# Patient Record
Sex: Female | Born: 1970 | Race: Black or African American | Hispanic: No | Marital: Married | Smoking: Never smoker
Health system: Southern US, Community
[De-identification: ages and names within clinical notes are randomized; demographics above are authoritative.]

## PROBLEM LIST (undated history)

## (undated) DIAGNOSIS — M75102 Unspecified rotator cuff tear or rupture of left shoulder, not specified as traumatic: Secondary | ICD-10-CM

## (undated) DIAGNOSIS — F418 Other specified anxiety disorders: Secondary | ICD-10-CM

## (undated) DIAGNOSIS — N3941 Urge incontinence: Secondary | ICD-10-CM

## (undated) DIAGNOSIS — G4700 Insomnia, unspecified: Secondary | ICD-10-CM

## (undated) DIAGNOSIS — R3915 Urgency of urination: Secondary | ICD-10-CM

## (undated) DIAGNOSIS — IMO0002 Reserved for concepts with insufficient information to code with codable children: Secondary | ICD-10-CM

## (undated) DIAGNOSIS — K5909 Other constipation: Secondary | ICD-10-CM

## (undated) DIAGNOSIS — M62830 Muscle spasm of back: Secondary | ICD-10-CM

## (undated) DIAGNOSIS — M961 Postlaminectomy syndrome, not elsewhere classified: Secondary | ICD-10-CM

## (undated) DIAGNOSIS — M5440 Lumbago with sciatica, unspecified side: Secondary | ICD-10-CM

## (undated) DIAGNOSIS — Z1231 Encounter for screening mammogram for malignant neoplasm of breast: Secondary | ICD-10-CM

## (undated) DIAGNOSIS — Z01818 Encounter for other preprocedural examination: Secondary | ICD-10-CM

## (undated) DIAGNOSIS — F419 Anxiety disorder, unspecified: Secondary | ICD-10-CM

## (undated) DIAGNOSIS — N939 Abnormal uterine and vaginal bleeding, unspecified: Secondary | ICD-10-CM

## (undated) DIAGNOSIS — Z6841 Body Mass Index (BMI) 40.0 and over, adult: Secondary | ICD-10-CM

## (undated) DIAGNOSIS — N950 Postmenopausal bleeding: Secondary | ICD-10-CM

## (undated) DIAGNOSIS — G8929 Other chronic pain: Secondary | ICD-10-CM

## (undated) DIAGNOSIS — S46012A Strain of muscle(s) and tendon(s) of the rotator cuff of left shoulder, initial encounter: Secondary | ICD-10-CM

## (undated) DIAGNOSIS — F5101 Primary insomnia: Secondary | ICD-10-CM

## (undated) DIAGNOSIS — K222 Esophageal obstruction: Secondary | ICD-10-CM

## (undated) DIAGNOSIS — I499 Cardiac arrhythmia, unspecified: Secondary | ICD-10-CM

## (undated) DIAGNOSIS — J45909 Unspecified asthma, uncomplicated: Secondary | ICD-10-CM

## (undated) DIAGNOSIS — Z789 Other specified health status: Secondary | ICD-10-CM

## (undated) DIAGNOSIS — K219 Gastro-esophageal reflux disease without esophagitis: Secondary | ICD-10-CM

## (undated) DIAGNOSIS — M1990 Unspecified osteoarthritis, unspecified site: Secondary | ICD-10-CM

## (undated) DIAGNOSIS — E669 Obesity, unspecified: Secondary | ICD-10-CM

## (undated) DIAGNOSIS — I209 Angina pectoris, unspecified: Secondary | ICD-10-CM

## (undated) HISTORY — PX: TONSILLECTOMY: SUR1361

## (undated) HISTORY — PX: COLONOSCOPY: SHX174

## (undated) HISTORY — PX: FRACTURE SURGERY: SHX138

## (undated) HISTORY — PX: WISDOM TOOTH EXTRACTION: SHX21

## (undated) MED ORDER — IBUPROFEN 600 MG TAB
600 mg | ORAL_TABLET | Freq: Three times a day (TID) | ORAL | Status: AC | PRN
Start: ? — End: 2011-12-27

## (undated) MED ORDER — HYDROCODONE-ACETAMINOPHEN 5 MG-325 MG TAB
5-325 mg | ORAL_TABLET | ORAL | Status: DC | PRN
Start: ? — End: 2018-07-25

---

## 1996-02-08 HISTORY — PX: ECTOPIC PREGNANCY SURGERY: SHX613

## 1996-02-08 HISTORY — PX: TONSILLECTOMY AND ADENOIDECTOMY: SUR1326

## 2002-02-07 HISTORY — PX: CARPAL TUNNEL RELEASE: SHX101

## 2009-10-23 LAB — METABOLIC PANEL, COMPREHENSIVE
A-G Ratio: 0.8 (ref 0.8–1.7)
ALT (SGPT): 32 U/L (ref 30–65)
AST (SGOT): 16 U/L (ref 15–37)
Albumin: 3.7 g/dL (ref 3.4–5.0)
Alk. phosphatase: 81 U/L (ref 50–136)
Anion gap: 5 mmol/L (ref 5–15)
BUN/Creatinine ratio: 7 — ABNORMAL LOW (ref 12–20)
BUN: 6 MG/DL — ABNORMAL LOW (ref 7–18)
Bilirubin, total: 0.6 MG/DL (ref 0.2–1.0)
CO2: 30 MMOL/L (ref 21–32)
Calcium: 9.1 MG/DL (ref 8.4–10.4)
Chloride: 100 MMOL/L (ref 100–108)
Creatinine: 0.9 MG/DL (ref 0.6–1.3)
GFR est AA: >60 mL/min/{1.73_m2} (ref >60)
GFR est non-AA: >60 mL/min/{1.73_m2} (ref >60)
Globulin: 4.5 g/dL — ABNORMAL HIGH (ref 2.0–4.0)
Glucose: 98 MG/DL (ref 74–99)
Potassium: 3.6 MMOL/L (ref 3.5–5.5)
Protein, total: 8.2 g/dL (ref 6.4–8.2)
Sodium: 135 MMOL/L — ABNORMAL LOW (ref 136–145)

## 2009-10-23 LAB — CBC WITH AUTOMATED DIFF
ABS. BASOPHILS: 0 10*3/uL (ref 0.0–0.06)
ABS. EOSINOPHILS: 0.2 10*3/uL (ref 0.0–0.4)
ABS. LYMPHOCYTES: 3.4 10*3/uL (ref 0.9–3.6)
ABS. MONOCYTES: 0.4 10*3/uL (ref 0.05–1.2)
ABS. NEUTROPHILS: 4.6 10*3/uL (ref 1.8–8.0)
BASOPHILS: 0 % (ref 0–2)
EOSINOPHILS: 2 % (ref 0–5)
HCT: 37.4 % (ref 35.0–45.0)
HGB: 12.4 g/dL (ref 12.0–16.0)
LYMPHOCYTES: 40 % (ref 21–52)
MCH: 30 PG (ref 24.0–34.0)
MCHC: 33.2 g/dL (ref 31.0–37.0)
MCV: 90.3 FL (ref 74.0–97.0)
MONOCYTES: 5 % (ref 3–10)
MPV: 9 FL — ABNORMAL LOW (ref 9.2–11.8)
NEUTROPHILS: 53 % (ref 40–73)
PLATELET: 345 10*3/uL (ref 135–420)
RBC: 4.14 M/uL — ABNORMAL LOW (ref 4.20–5.30)
RDW: 11.9 % (ref 11.6–14.5)
WBC: 8.6 10*3/uL (ref 4.6–13.2)

## 2009-10-23 LAB — URINALYSIS W/ RFLX MICROSCOPIC
Bilirubin: NEGATIVE
Blood: NEGATIVE
Glucose: NEGATIVE MG/DL
Ketone: NEGATIVE MG/DL
Leukocyte Esterase: NEGATIVE
Nitrites: NEGATIVE
Protein: NEGATIVE MG/DL
Specific gravity: >1.03 — ABNORMAL HIGH (ref 1.003–1.030)
Urobilinogen: 1 EU/DL (ref 0.2–1.0)
pH (UA): 6 (ref 5.0–8.0)

## 2009-10-23 LAB — HCG URINE, QL: HCG urine, QL: NEGATIVE

## 2009-10-23 LAB — LIPASE: Lipase: 81 U/L (ref 73–393)

## 2009-10-23 LAB — AMYLASE: Amylase: 46 U/L (ref 25–115)

## 2011-07-26 ENCOUNTER — Encounter

## 2011-09-21 NOTE — H&P (Signed)
[facility]                               [address] [place], [place] [address]                                   PRE-ADMISSION                             HISTORY AND PHYSICAL    PATIENT:    Tiffany [name], Tiffany [name]  MRN:           [id number]       DATE:   09/23/2011  BILLING:       [id number]    ROOM:  DICTATING:  [name], MD      HISTORY OF PRESENT ILLNESS: The patient is a 41 year old female who  presents to our office with severe right knee pain. This pain is worse with  activity and is better with rest. She has a painful catching sensation with  walking and has noted significant swelling.    REVIEW OF SYSTEMS  Negative for any headaches, blurred vision, cough, congestion, rhinitis,  sore throat, chest pain, palpitations, shortness of breath, abdominal pain,  bleeding, bruising, numbness or tingling.    A 12-point review of systems is otherwise negative.    PAST MEDICAL HISTORY: Unremarkable.    MEDICATIONS: She currently takes no medications.    ALLERGIES: SHE IS ALLERGIC TO DARVOCET.    PHYSICAL EXAMINATION  VITAL SIGNS: Temperature 97.8, pulse 68, respirations 16, blood pressure  132/88, weight 289, height 5 feet and 5 inches.  GENERAL APPEARANCE: Well-developed, well-nourished female in no acute  distress.  SKIN: No obvious lesions or lacerations noted.  EYES: EOMs intact. PERRLA.  EARS, NOSE AND MOUTH: Without obvious lesions.  NECK: Supple, full range of motion.  MUSCULOSKELETAL: Positive effusion, medial joint line tenderness to  palpation. Full range of motion with pain with hyperflexion.  HEART: Regular rate and rhythm.  LUNGS: Clear to auscultation bilaterally.  ABDOMEN: Soft, nontender. Bowel sounds present in all 4 quadrants.  NEUROLOGIC: Without obvious deficit.    DATA: An MRI is reviewed and is consistent with a right knee medial  meniscus tear.    IMPRESSION: Right knee medial meniscus tear.    PLAN: Given the patient's  persistent pain, she wishes to proceed with  surgery. All risks and benefits of surgery including bleeding, risk of  infection, and recurrence of pain were discussed and the patient wishes to  proceed with a right knee arthroscopic partial medial meniscectomy.  Arrangements will be made at this time. A history and physical was  performed.        Dictated By: Burnett Corrente, PA-C             [name], MD    ELP:wmx  D: 09/21/2011  11:43 A  T: 09/21/2011  12:12 P  Job: [id number]  CScriptDoc #: [id number]  cc:   [name], MD

## 2011-09-23 ENCOUNTER — Inpatient Hospital Stay: Payer: BLUE CROSS/BLUE SHIELD

## 2011-09-23 LAB — HCG URINE, QL. - POC: Pregnancy test,urine (POC): NEGATIVE

## 2011-09-23 MED ORDER — HYDROCODONE-ACETAMINOPHEN 7.5 MG-325 MG TAB
ORAL_TABLET | ORAL | Status: DC | PRN
Start: 2011-09-23 — End: 2011-12-20

## 2011-09-23 MED ADMIN — meperidine (DEMEROL) injection 12.5 mg: INTRAVENOUS | @ 16:00:00 | NDC 00409117630

## 2011-09-23 MED ADMIN — famotidine (PF) (PEPCID) injection 20 mg: INTRAVENOUS | @ 15:00:00 | NDC 00641602201

## 2011-09-23 MED ADMIN — HYDROmorphone (PF) (DILAUDID) 2 mg/mL injection: INTRAVENOUS | @ 16:00:00 | NDC 59011044225

## 2011-09-23 MED ADMIN — meperidine (DEMEROL) 25 mg/ml injection: INTRAVENOUS | @ 16:00:00 | NDC 00409117630

## 2011-09-23 MED ADMIN — lactated ringers infusion: INTRAVENOUS | @ 15:00:00 | NDC 00409795309

## 2011-09-23 MED ADMIN — ceFAZolin (ANCEF) 1 g in 0.9% sodium chloride (MBP/ADV) 50 mL MBP: INTRAVENOUS | @ 15:00:00 | NDC 00338055311

## 2011-09-23 MED ADMIN — bupivacaine-EPINEPHrine (PF) (SENSORCAINE PF) 0.5 %-1:200,000 injection: SUBCUTANEOUS | @ 16:00:00 | NDC 00409174929

## 2011-09-23 MED FILL — LACTATED RINGERS IV: INTRAVENOUS | Qty: 1000

## 2011-09-23 MED FILL — FAMOTIDINE (PF) 20 MG/2 ML IV: 20 mg/2 mL | INTRAVENOUS | Qty: 2

## 2011-09-23 MED FILL — MIDAZOLAM 1 MG/ML IJ SOLN: 1 mg/mL | INTRAMUSCULAR | Qty: 2

## 2011-09-23 MED FILL — DEMEROL (PF) 25 MG/ML INJECTION SYRINGE: 25 mg/mL | INTRAMUSCULAR | Qty: 1

## 2011-09-23 MED FILL — DILAUDID (PF) 2 MG/ML INJECTION SOLUTION: 2 mg/mL | INTRAMUSCULAR | Qty: 1

## 2011-09-23 MED FILL — BD POSIFLUSH NORMAL SALINE 0.9 % INJECTION SYRINGE: INTRAMUSCULAR | Qty: 10

## 2011-09-23 MED FILL — FENTANYL CITRATE (PF) 50 MCG/ML IJ SOLN: 50 mcg/mL | INTRAMUSCULAR | Qty: 5

## 2011-09-23 MED FILL — CEFAZOLIN 1 GRAM SOLUTION FOR INJECTION: 1 gram | INTRAMUSCULAR | Qty: 1000

## 2011-09-23 NOTE — Progress Notes (Signed)
Post-Anesthesia Evaluation & Assessment    Visit Vitals   Item Reading   ??? BP 121/62   ??? Pulse 78   ??? Temp 97.4 ??F (36.3 ??C)   ??? Resp 15   ??? Ht 5\' 5"  (1.651 m)   ??? Wt 131.997 kg (291 lb)   ??? BMI 48.43 kg/m2   ??? SpO2 99%       Nausea/Vomiting: no nausea    Post-operative hydration :adequate.    Pain score (VAS): 2    Mental status & Level of consciousness: alert and oriented x 3    Neurological status: moves all extremities, sensation grossly intact    Pulmonary status: airway patent, no supplemental oxygen required    Complications related to anesthesia: none    Patient has met all discharge requirements.    Additional comments:        JU-FANG Vernida Mcnicholas, MD

## 2011-09-23 NOTE — H&P (Signed)
Date of Surgery Update:  LACRECIA DELVAL was seen and examined.  History and physical has been reviewed. There have been no significant clinical changes since the completion of the originally dated History and Physical.  Patient identified by surgeon; surgical site was confirmed by patient and surgeon.    Signed By: Loma Newton, PA     September 23, 2011 10:04 AM

## 2011-09-23 NOTE — Brief Op Note (Signed)
[facility]     Operative Note      Patient: Tiffany [name]               Sex: female          DOA: 09/23/2011         Date of Birth:  Jan 14, 1971      Age:  41 y.o.        LOS:  LOS: 0 days       Preoperative Diagnosis:  836.0 right knee    Postoperative Diagnosis: 836.0  Right knee    Surgeon:  [name] MD     Assistant: Loma Newton, PA-C    Anesthesia:  general anesthesia    Procedure:  Procedure(s) with comments:  KNEE ARTHROSCOPY - RIGHT KNEE ARTHROSCOPIC PARTIAL MEDIAL MENISCECTOMY    Time out performed: YES    Estimated Blood Loss: 0 ML EBL           Implants:  * No implants in log *    Specimens: * No specimens in log *     Tubes or Drains:  NO           Complications:  None           Counts: Sponge and needle counts were correct times two.        Loma Newton PA-C  [facility]

## 2011-09-23 NOTE — Op Note (Signed)
[facility]                               [address]                          [place], [place] [address]                                 OPERATIVE REPORT    PATIENT:     [name], Tiffany D  MRN:            [id number]      DATE:   09/23/2011  BILLING:     [id number]  ROOM:        PACUPACUPL  ATTENDING:   Maudry Zeidan LUCIANO-PEREZ, MD  DICTATING:   Nou Chard LUCIANO-PEREZ, MD      PREOPERATIVE DIAGNOSIS: Medial meniscus tear, degenerative arthritis, right  knee.    POSTOPERATIVE DIAGNOSIS: Medial and lateral meniscus tear, degenerative  arthritis, grade 3 chondromalacia, medial femoral condyle, right knee.    PROCEDURES PERFORMED: Arthroscopy, partial medial and lateral meniscectomy,  chondroplasty medial femoral condyle, right knee.    ESTIMATED BLOOD LOSS:    SPECIMENS REMOVED:    SURGEON: Elzada Pytel Luciano-Perez, MD    ASSISTANT: Sarah M. Milosek, PA-C    BRIEF HISTORY: Patient has had significant problems with the right knee, no  response to conservative treatment. Consented for surgery after having  discussed at length possible risks and complications of surgery including  infection, bleeding, recurrence of pain, among other possible problems.    DESCRIPTION OF PROCEDURE: Patient was taken to the operating room, induced  under general endotracheal anesthesia by the anesthesia staff. The right  leg was prepped with DuraPrep solution and draped as a free sterile field.  An anterolateral portal was used as an arthroscopy portal, medial portal  was used as a work portal. Portals were made with 11 blade followed by  blunt trocars. Once the arthroscope was in place, the knee was  systematically evaluated. Suprapatellar pouch, patellofemoral joint with  significant grade 4 changes ________ femoral trochlea. Medial compartment,  a 3-cm area of grade 3 changes with unstable flaps, debrided to a stable  base using a 3.5 shaver. The radial tear of the posterior horn of the  medial  meniscus was debrided using straight basket forceps, 3.5 shaver  leaving a stable rim. Anterior cruciate ligament was intact. Lateral  compartment, no evidence of tears in the articular surface. Some cleavage  tear in the posterolateral attachment of the lateral meniscus that was  debrided to a stable base using a 3.5 shaver. Fluid was suctioned out. The  knee was injected with a mixture of Marcaine and Astramorph. Portals were  closed with 4-0 Monocryl. Sterile dressings were applied. Patient tolerated  procedure well, was taken to recovery without problems.                     Darvis Croft LUCIANO-PEREZ, MD    ELP:wmx  D: 09/23/2011 T: 09/23/2011 12:43 P  Job: [id number]  CScriptDoc #: [id number]  cc:   Aveen Stansel LUCIANO-PEREZ, MD

## 2011-09-23 NOTE — Op Note (Signed)
[facility]                               [address] [place], [place] [address]                                 OPERATIVE REPORT    PATIENT:     TOBA, CLAUDIO  MRN:            [id number]      DATE:   09/23/2011  BILLING:     [id number]  ROOM:        PACUPACUPL  ATTENDING:   [name], MD  DICTATING:   [name], MD      PREOPERATIVE DIAGNOSIS: Medial meniscus tear, degenerative arthritis, right  knee.    POSTOPERATIVE DIAGNOSIS: Medial and lateral meniscus tear, degenerative  arthritis, grade 3 chondromalacia, medial femoral condyle, right knee.    PROCEDURES PERFORMED: Arthroscopy, partial medial and lateral meniscectomy,  chondroplasty medial femoral condyle, right knee.    ESTIMATED BLOOD LOSS:    SPECIMENS REMOVED:    SURGEON: [name], MD    ASSISTANT: Burnett Corrente, PA-C    BRIEF HISTORY: Patient has had significant problems with the right knee, no  response to conservative treatment. Consented for surgery after having  discussed at length possible risks and complications of surgery including  infection, bleeding, recurrence of pain, among other possible problems.    DESCRIPTION OF PROCEDURE: Patient was taken to the operating room, induced  under general endotracheal anesthesia by the anesthesia staff. The right  leg was prepped with DuraPrep solution and draped as a free sterile field.  An anterolateral portal was used as an arthroscopy portal, medial portal  was used as a work portal. Portals were made with 11 blade followed by  blunt trocars. Once the arthroscope was in place, the knee was  systematically evaluated. Suprapatellar pouch, patellofemoral joint with  significant grade 4 changes ________ femoral trochlea. Medial compartment,  a 3-cm area of grade 3 changes with unstable flaps, debrided to a stable  base using a 3.5 shaver. The radial tear of the posterior horn of the  medial  meniscus was debrided using straight basket forceps, 3.5 shaver  leaving a stable rim. Anterior cruciate ligament was intact. Lateral  compartment, no evidence of tears in the articular surface. Some cleavage  tear in the posterolateral attachment of the lateral meniscus that was  debrided to a stable base using a 3.5 shaver. Fluid was suctioned out. The  knee was injected with a mixture of Marcaine and Astramorph. Portals were  closed with 4-0 Monocryl. Sterile dressings were applied. Patient tolerated  procedure well, was taken to recovery without problems.                     [name], MD    ELP:wmx  D: 09/23/2011 T: 09/23/2011 12:43 P  Job: [id number]  CScriptDoc #: [id number]  cc:   [name], MD

## 2011-09-26 MED FILL — LIDOCAINE (PF) 20 MG/ML (2 %) IJ SOLN: 20 mg/mL (2 %) | INTRAMUSCULAR | Qty: 5

## 2011-09-26 MED FILL — KETOROLAC TROMETHAMINE 30 MG/ML INJECTION: 30 mg/mL (1 mL) | INTRAMUSCULAR | Qty: 1

## 2011-09-26 MED FILL — LACTATED RINGERS IV: INTRAVENOUS | Qty: 1000

## 2011-09-26 MED FILL — ONDANSETRON (PF) 4 MG/2 ML INJECTION: 4 mg/2 mL | INTRAMUSCULAR | Qty: 2

## 2011-09-26 MED FILL — GLYCOPYRROLATE 0.2 MG/ML IJ SOLN: 0.2 mg/mL | INTRAMUSCULAR | Qty: 1

## 2011-09-26 MED FILL — DEXAMETHASONE SODIUM PHOSPHATE 4 MG/ML IJ SOLN: 4 mg/mL | INTRAMUSCULAR | Qty: 1

## 2011-10-05 NOTE — Progress Notes (Signed)
PT DAILY TREATMENT     Patient Name: Tiffany [name]  Date:10/05/2011  DOB: 02-08-1970  [x]   Patient DOB Verified  Payor: BLUE CR[place]  [place]  Product Type: PPO     In time:200  Out time:258  Total Treatment Time (min): 58  Visit #: 1 of 9    Treatment Area: Tear of medial cartilage or meniscus of knee, current [836.0]    SUBJECTIVE  Pain Level (0-10 scale): 7/10  Any medication changes, allergies to medications, adverse drug reactions, diagnosis change, or new procedure performed?: []  No    [x]  Yes (see summary sheet for update)  Subjective functional status/changes:   []  No changes reported  Pt c/o R knee pain s/p arthroscopic menisectomy on 09/23/11. Had stiches removed yesterday during f/u appointment with MD. Was informed that there were actually 2 tears in medial meniscus. Pt has been resting as much as possible since surgery. Had errands to run yesterday and had to go to MD, and was in "excrutiating pain" last night due to increased levels of activity. Prior to yesterday, knee pain was ~5/10. Hx of Baker's cyst on R knee. Pt fell getting out of bed; knee pain began after this (several months ago). Pt c/o difficulty with stairs, squatting, maneuvering LE in bed, walking. Pt was independent prior to knee injury. 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 Pt is active and is on her feet a lot.    OBJECTIVE    Physical Therapy Evaluation - Knee    Posture: []  Varus    []  Valgus    []  Recurvatum        []  Tibial Torsion    []  Foot Supination    []  Foot Pronation    Describe: no significant abnormalities    Gait:  []  Normal    [x]  Abnormal    [x]  Antalgic    []  NWB    Device: SPC    Describe: Pt ambulates with cane in R hand, decreased weight shift to R LE    ROM / Strength  []  Unable to assess                  AROM                      PROM                   Strength (1-5)    Left Right Left Right Left Right   Hip Flexion     4+/5 4/5    Extension     2+/5 2/5     Abduction     4+/5 4/5    Adduction         Knee Flexion 110 93   4+/5 3/5    Extension 0 4   5/5 3+/5   Ankle Plantarflexion     5/5 5/5    Dorsiflexion     5/5 5/5       Flexibility: []  Unable to assess at this time  Hamstrings:    (L) Tightness= []  WNL   [x]  Min   []  Mod   []  Severe    (R) Tightness= []  WNL   []  Min   [x]  Mod   []  Severe  Quadriceps:    (L) Tightness= []  WNL   [x]  Min   []  Mod   []  Severe    (R) Tightness=  unable to assess due to knee pain with flexion; able to bend R knee in prone to approximately 90 degrees, pt denied quad tightness but reported knee pain    Palpation: TTP surrounding scope sites, anterior patellar region, distal quadriceps region and infrapatellar region    Optional Tests:  Patellar Mobility   [] L [] R Hypermobile [x] L [x] R Hypomobile              Other tests/comments:  OBJECTIVE  Modality (rationale): decrease pain  [x]   Ice pack 5  min in sitting with R LE elevated    Therapeutic Exercise: [x]  see flow sheet     []  Other:_ (minutes) :15  Added/Changed Exercises: Created and instructed pt in HEP; handout provided and pt voiced understanding   []   Added:_  to improve (function):  []   Changed:_ to improve (function):    Gait Training: [x]  300 feet w/ SPC device on level surface with supervision level of assist  [x]   Other: Educated pt on using SPC in L UE to aid in normalizing gait pattern; pt displays good sequencing with cuing from PT; pt displays several LOB, easily corrected by pt with UE support on walls              (minutes) :5    Patient Education: [x]  Review HEP    []  Progressed/Changed HEP based on:_   []  positioning   []  body mechanics   []  transfers   []  heat/ice application   (minutes) :    Pain Level (0-10 scale) post treatment: 6-7/10    ASSESSMENT  [x]   See Plan of Care    PLAN  []   Upgrade activities as tolerated     []   Continue plan of care  []   Discharge due to:_  [x]  Other:_initiate POC      Cherly Beach Strozier, PT 10/05/2011  2:07 PM

## 2011-10-05 NOTE — Progress Notes (Signed)
[facility] ??? [facility]  [address]  [place], [place] [address]  [phone]   [phone] fax    Plan of Care/ Statement of Necessity for Physical Therapy Services    Patient name: Tiffany [name]      Start of Care: 10/05/2011   Referral source: [name],*      DOB: 1970-09-26   Diagnosis: Tear of medial cartilage or meniscus of knee, current [836.0]   Onset Date:09/23/11    Prior Hospitalization:see medical history    Provider#: [id number]  Comorbidities: arthritis  Prior Level of Function:independent with ADL's, no AD for ambulation, full-time employment        The Plan of Care and following information is based on the information from the initial evaluation.  Assessment/ key information: Pt is a 41 year old female who presents to IE with chief c/o R knee pain s/p arthroscopic medial menisectomy on 09/23/11. Pt presents with decreased ROM, decreased strength, impaired gait, decreased patellar mobility, decreased hamstring flexibility, impaired transfer ability due to pain. Pt would benefit from skilled PT to address the impairments listed above and below.     Problem List: pain affecting function, decrease ROM, decrease strength, impaired gait/ balance, decrease ADL/ functional abilitiies, decrease activity tolerance, decrease flexibility/ joint mobility and decrease transfer abilities     Treatment Plan may include any combination of the following: Therapeutic exercise, Therapeutic activities, Neuromuscular re-education, Physical agent/modality, Gait/balance training, Manual therapy and Patient education    Patient / Family readiness to learn indicated by: asking questions, trying to perform skills and interest    Persons(s) to be included in education: patient (P)    Barriers to Learning/Limitations: no    Patient Goal (s): ???at least being able to exercise???    Rehabilitation Potential: good    Short Term Goals: To be accomplished in 1-2 weeks:  1.  Patient will demonstrate  independence and report compliance with HEP to allow patient to take an active role in the rehabilitation process.   2.  Patient will increase R knee AROM to at least 100 degrees of flexion to ease pt's ability to normalize gait pattern and ease pt's ability to perform sit to stand transfers.  Long Term Goals: To be accomplished in 3 weeks:  1.  Patient will improve LEFS score by at least 16 points to demonstrate significant functional improvements.   2.  Patient will increase R hip and knee strength by at least 1/3 MMT grade to assist pt with transfers and stair negotiation.   3.  Pt will improve R knee AROM to at least 0-110 degrees to ease sit to stand transfers and stair negotiation and aid in normalizing gait.  4.  Patient will ambulate at least 600' safely with normalized gait pattern and LRAD to allow pt to return to normal community ambulation.    Frequency / Duration: Patient to be seen 3 times per week for 3 weeks.    Patient/ Caregiver education and instruction: exercises and other ice and elevation of knee to address pain, use of AD in L UE and sequencing with AD    Santa Genera, PT 10/05/2011 5:11 PM    ________________________________________________________________________    I certify that the above Therapy Services are being furnished while the patient is under my care. I agree with the treatment plan and certify that this therapy is necessary.    Physician's Signature:____________________  Date:____________Time: _________    Please sign and return to [facility] ??? [facility]  [address]  [place], [place] [id number]  [phone]   [phone]) [phone] fax

## 2011-10-11 NOTE — Progress Notes (Signed)
PT DAILY TREATMENT NOTE     Patient Name: Tiffany [name]  Date:10/11/2011  DOB: May 05, 1970  [x]   Patient DOB Verified  Payor: BLUE CROSS  [place]: [place]  Product Type: PPO     In time:430  Out time:504  Total Treatment Time (min): 34  Visit #: 2 of 9    Treatment Area: Tear of medial cartilage or meniscus of knee, current [836.0]    SUBJECTIVE  Pain Level (0-10 scale): 5/10  Any medication changes, allergies to medications, adverse drug reactions, diagnosis change, or new procedure performed?: [x]  No    []  Yes (see summary sheet for update)  Subjective functional status/changes:   []  No changes reported  Pt reports that she has tried to quit using SPC all together. Reports she does not want to be dependent on cane and wants to start walking for fitness at her local track. Reports that it occasionally feels like R LE wants to give out on her. Has tried HEP at home.    OBJECTIVE  Therapeutic Exercise: [x]  see flow sheet     []  Other:_ (minutes) :29  Added/Changed Exercises:  []   Added:_  to improve (function):  []   Changed:_ to improve (function):    Gait Training: [x]  600 feet w/ no device on level surface with supervision level of assist  []   Other:_       (minutes) :5    Patient Education: [x]  Review HEP    []  Progressed/Changed HEP based on:_   []  positioning   []  body mechanics   []  transfers   []  heat/ice application   (minutes) :    Other Objective/Functional Measures: Initiated POC per flow sheet. Pt ambulated 600 feet with no AD and supervision. Consistently grasps wall with R UE while ambulating. Reports that she feels her feelings of instability are due to strength deficits, not balance deficits. Pt displays decreased weight shift to R LE with compensated Trendelenburg and decreased cadence. Recommended that pt continue to use SPC while out in the community to prevent falls (i.e. Due to slipping on wet floor, being bumped by other people, walking long distances, etc). Pt deferred offer for  cold pack this session; recommended that pt ice at home if she gets sore.  Pain Level (0-10 scale) post treatment: 5/10    ASSESSMENT  []   See Plan of Care  []   See progress note/recertification  [x]   Patient will continue to benefit from skilled therapy to address remaining functional deficits: pain, gait, strength, ROM/flexibility, transfers, return to PLOF    Progress towards goals / Updated goals:  STG # 1 : progressing per pt reports      PLAN  []   Upgrade activities as tolerated     [x]   Continue plan of care  []   Discharge due to:_  []  Other:_      Cherly Beach Strozier, PT 10/11/2011  4:43 PM

## 2011-10-12 NOTE — Progress Notes (Signed)
PT DAILY TREATMENT NOTE     Patient Name: Tiffany [name]  Date:10/12/2011  DOB: 1970/03/10  [x]   Patient DOB Verified  Payor: BLUE CROSS  [place]  Product Type: PPO     In time:2:00  Out time:2:35  Total Treatment Time (min): 35  Visit #: 3 of 9    Treatment Area: Tear of medial cartilage or meniscus of knee, current [836.0]    SUBJECTIVE  Pain Level (0-10 scale): 4/10  Any medication changes, allergies to medications, adverse drug reactions, diagnosis change, or new procedure performed?: [x]  No    []  Yes (see summary sheet for update)  Subjective functional status/changes:   []  No changes reported  Patient reports that she feels like her knee locks sometimes.     OBJECTIVE    Therapeutic Exercise: [x]  see flow sheet     []  Other:_ (minutes) :30  Added/Changed Exercises:  []   Added:_  to improve (function):  []   Changed:_ to improve (function):    Therapeutic Activity: balance and dynamic gait  (minutes) :5    Patient Education: [x]  Review HEP    []  Progressed/Changed HEP based on:_   []  positioning   []  body mechanics   []  transfers   []  heat/ice application   (minutes) :    Other Objective/Functional Measures: No complaints of pain with TherEx only fatigue. (R) knee flexion AROM is 107 degrees showing progress.   Pain Level (0-10 scale) post treatment: 4/10    ASSESSMENT  []   See Plan of Care  []   See progress note/recertification  [x]   Patient will continue to benefit from skilled therapy to address remaining functional deficits: pain control, flexibility, ROM, strength, balance and gait to increase ease with ADLs.     Progress towards goals / Updated goals:  STG # 2: met with knee flexion AROM    PLAN  [x]   Upgrade activities as tolerated     [x]   Continue plan of care  []   Discharge due to:_  []  Other:_      French Ana, PTA 10/12/2011  2:03 PM

## 2011-10-14 NOTE — Progress Notes (Signed)
PT DAILY TREATMENT NOTE     Patient Name: Tiffany [name]  Date:10/14/2011  DOB: May 15, 1970  [x]   Patient DOB Verified  Payor: BLUE CR[place]  [place]  Product Type: PPO     In time:1:02  Out time:1:53  Total Treatment Time (min): 51  Visit #: 4 of 9    Treatment Area: Tear of medial cartilage or meniscus of knee, current [836.0]    SUBJECTIVE  Pain Level (0-10 scale): 5/10  Any medication changes, allergies to medications, adverse drug reactions, diagnosis change, or new procedure performed?: [x]  No    []  Yes (see summary sheet for update)  Subjective functional status/changes:   []  No changes reported  Patient reports that she has been having trouble sleeping due to pain and aching at night. States that her knee was aching very bad last night. Pain med and ice did not relieve pain.    OBJECTIVE  Modality rationale:  decrease pain and swelling   10 min [x]   Other: Vaso to (R) knee     Therapeutic Exercise: [x]  see flow sheet     []  Other:_ (minutes) :36  Added/Changed Exercises:  []   Added:_  to improve (function):  []   Changed:_ to improve (function):    Therapeutic Activity: balance and dynamic gait  (minutes) :5    Patient Education: [x]  Review HEP    []  Progressed/Changed HEP based on:_   []  positioning   []  body mechanics   []  transfers   []  heat/ice application   (minutes) :    Other Objective/Functional Measures: Patient had no c/o increased pain with TherEx. Knee flexion AROM is 113 degrees.   Pain Level (0-10 scale) post treatment: 4/10    ASSESSMENT  []   See Plan of Care  []   See progress note/recertification  [x]   Patient will continue to benefit from skilled therapy to address remaining functional deficits: pain and swelling control, flexibility, ROM, strength, balance and gait to increase ease with ADLs.     Progress towards goals / Updated goals:  LTG# 3 : met with ROM    PLAN  [x]   Upgrade activities as tolerated     [x]   Continue plan of care  []   Discharge due to:_  []  Other:_       French Ana, PTA 10/14/2011  1:18 PM

## 2011-10-17 NOTE — Progress Notes (Signed)
PT DAILY TREATMENT NOTE     Patient Name: Tiffany [name]  Date:10/17/2011  DOB: [birth year]  [x]   Patient DOB Verified  Payor: BLUE CROSS  [place]  Product Type: PPO     In time:1:05  Out time:1:48  Total Treatment Time (min): 43  Visit #: 5 of 9    Treatment Area: Tear of medial cartilage or meniscus of knee, current [836.0]    SUBJECTIVE  Pain Level (0-10 scale): 3/10  Any medication changes, allergies to medications, adverse drug reactions, diagnosis change, or new procedure performed?: [x]  No    []  Yes (see summary sheet for update)  Subjective functional status/changes:   []  No changes reported  Patient reports that her knee is feeling pretty good today.     OBJECTIVE    Therapeutic Exercise: [x]  see flow sheet     []  Other:_ (minutes) :33  Added/Changed Exercises:  []   Added:_  to improve (function):  []   Changed:_ to improve (function):    Therapeutic Activity: balance and dynamic gait  (minutes) :5    Manual Therapy: circulatory k-tape   (minutes) :5    Patient Education: [x]  Review HEP    []  Progressed/Changed HEP based on:_   []  positioning   []  body mechanics   []  transfers   []  heat/ice application   (minutes) :    Other Objective/Functional Measures: Patient required VCs for technique with mini-squats and reports discomfort with the exercise. MMT (R) quads: 4-/5 HS: 4-/5. Added circulatory kinesiotape today to decrease swelling.     Pain Level (0-10 scale) post treatment: 3/10    ASSESSMENT  []   See Plan of Care  []   See progress note/recertification  [x]   Patient will continue to benefit from skilled therapy to address remaining functional deficits: pain and swelling control, flexibility, ROM, strength and balance to increase ease with ADLs.     Progress towards goals / Updated goals:  LTG# 2 : progressing with strength     PLAN  [x]   Upgrade activities as tolerated     [x]   Continue plan of care  []   Discharge due to:_  []  Other:_      French Ana, PTA 10/17/2011  1:10 PM

## 2011-10-19 NOTE — Progress Notes (Signed)
PT DAILY TREATMENT NOTE     Patient Name: Tiffany [name]  Date:10/19/2011  DOB: 01-May-1970  [x]   Patient DOB Verified  Payor: BLUE CROSS  [place]  Product Type: PPO     In time:1:02  Out time:1:37  Total Treatment Time (min): 35  Visit #: 6 of 9    Treatment Area: Tear of medial cartilage or meniscus of knee, current [836.0]    SUBJECTIVE  Pain Level (0-10 scale): 3/10  Any medication changes, allergies to medications, adverse drug reactions, diagnosis change, or new procedure performed?: [x]  No    []  Yes (see summary sheet for update)  Subjective functional status/changes:   []  No changes reported  Patient reports that her knee felt stiff yesterday.     OBJECTIVE    Therapeutic Exercise: [x]  see flow sheet     []  Other:_ (minutes) :25  Added/Changed Exercises:  []   Added:_  to improve (function):  [x]   Changed:_increased per flow sheet to improve (function): strength     Therapeutic Activity: balance and dynamic gait  (minutes) :5    Neuromuscular re-ed: quads/VMO   (minutes): 5    Patient Education: [x]  Review HEP    []  Progressed/Changed HEP based on:_   []  positioning   []  body mechanics   []  transfers   []  heat/ice application   (minutes) :    Other Objective/Functional Measures: LEFS score is 28.8% showing an 18.8% improvement in function since [facility]. Patient c/o slight increased pain with increase in TherEx today. PD ice in clinic. Encouraged patient to apply ice at home. Circulatory k-tape still intact. Plan to re-apply next visit.   Pain Level (0-10 scale) post treatment: 4/10    ASSESSMENT  []   See Plan of Care  []   See progress note/recertification  [x]   Patient will continue to benefit from skilled therapy to address remaining functional deficits: pain and swelling control, flexibility and strength to increase ease with ADLs and (A) return to PLOF.     Progress towards goals / Updated goals:  LTG# 1 : progressing well as LEFS score shows a 15 point increase since IE.     PLAN  [x]    Upgrade activities as tolerated     [x]   Continue plan of care  []   Discharge due to:_  []  Other:_      French Ana, PTA 10/19/2011  1:05 PM

## 2011-10-21 NOTE — Progress Notes (Signed)
PT DAILY TREATMENT NOTE     Patient Name: Tiffany [name]  Date:10/21/2011  DOB: 12/01/1970  [x]   Patient DOB Verified  Payor: BLUE CROSS  [place]  Product Type: PPO     In time:1:00  Out time:1:44  Total Treatment Time (min): 44  Visit #: 7 of 9    Treatment Area: Tear of medial cartilage or meniscus of knee, current [836.0]    SUBJECTIVE  Pain Level (0-10 scale): 5/10  Any medication changes, allergies to medications, adverse drug reactions, diagnosis change, or new procedure performed?: [x]  No    []  Yes (see summary sheet for update)  Subjective functional status/changes:   []  No changes reported  Patient reports that her knee is starting to "lock" again like it did before surgery. States that she was in a lot of pain yesterday.     OBJECTIVE    Therapeutic Exercise: [x]  see flow sheet     []  Other:_ (minutes) :34  Added/Changed Exercises:  []   Added:_  to improve (function):  []   Changed:_ to improve (function):    Therapeutic Activity: balance and dynamic gait  (minutes) :5    Manual Therapy: patellar mobs    (minutes) :5    Patient Education: [x]  Review HEP    []  Progressed/Changed HEP based on:_   []  positioning   []  body mechanics   []  transfers   []  heat/ice application   (minutes) :    Other Objective/Functional Measures: Key functional changes: Patient is progressing in PT with improved ROM and strength. (R) knee AROM is 0-113 degrees. (R) knee strength is 4-/5. MMT (R) hip strength is 4/5. Patient ambulates community distances without A.D however ambulates with antalgic gait pattern demonstrating decreased weight shift and stance time on (R) LE. LEFS score is 28.8% showing an 18.8% improvement in function since IE. Patient has recently began reporting a "locking" sensation in her knee that she says is similar to how it felt prior to surgery. Average pain level is 4-5/10.   Pain Level (0-10 scale) post treatment: 5/10    ASSESSMENT  []   See Plan of Care  [x]   See progress  note/recertification  [x]   Patient will continue to benefit from skilled therapy to address remaining functional deficits: pain and swelling control, flexibility, strength and gait to increase ease with ADLs and (A) return to PLOF. Continue PT 2-3 times per week for 3 weeks.     Progress towards goals / Updated goals:  STG # 1 : met as pt reports compliance with HEP  STG # 2: met with increased knee ROM  LTG# 1 : progressing well as LEFS shows 18.8% improvement since IE  LTG# 2 : progressing well with strength   LTG# 3 : met with knee ROM  LTG# 4 : progressing with gait    Updated goals:  1. Patient will improve LEFS score by 10% to increase ease with ADLs.  2. Patient will ambulate community distances with normalized gait pattern to (A) return to PLOF.  3. Patient will increase (R) LE strength by 1/3 MMT grade to increase ease with ADLs.      PLAN  [x]   Upgrade activities as tolerated     [x]   Continue plan of care  []   Discharge due to:_  []  Other:_      French Ana, PTA 10/21/2011  1:01 PM

## 2011-10-21 NOTE — Progress Notes (Signed)
[facility] ??? [facility]  [address]  [place], [place] [address]  [phone]   [phone] fax    Physician Update  [x]  Progress Note  []  Discharge Summary  Tiffany [name]  1970/07/16  [name],*  Diagnosis: R knee medial menisectomy 09/23/11  Date of Initial Visit: 10/05/11  Attended Visits: 7  Missed Visits: 1    Progress towards Goals: Pt is a 41 year old female who presented to IE with chief c/o R knee pain s/p medial menisectomy. Pt is progressing well in PT and has met goals as described below. Current objective measurements are as follows: Patient is progressing in PT with improved ROM and strength. (R) knee AROM is 0-113 degrees. (R) knee strength is 4-/5. MMT (R) hip strength is 4/5. Patient ambulates community distances without A.D however ambulates with antalgic gait pattern demonstrating decreased weight shift and stance time on (R) LE. LEFS score is 28.8% showing an 18.8% improvement in function since IE. Patient has recently began reporting a "locking" sensation in her knee that she says is similar to how it felt prior to surgery. Average pain level is 4-5/10.     Progress towards goals / Updated goals:   STG # 1 : met as pt reports compliance with HEP   STG # 2: met with increased knee ROM   LTG# 1 : progressing well as LEFS shows 18.8% improvement since IE   LTG# 2 : progressing well with strength   LTG# 3 : met with knee ROM   LTG# 4 : progressing with gait    Goals: to be achieved in 3-4 weeks:  Updated goals:   1. Patient will improve LEFS score by 10% to increase ease with ADLs.   2. Patient will ambulate community distances with normalized gait pattern to (A) return to PLOF.   3. Patient will increase (R) LE strength by 1/3 MMT grade to increase ease with ADLs.     ASSESSMENT/RECOMMENDATIONS:  [x] Continue therapy per initial plan/protocol at a frequency of  2-3 x per week for 3-4 weeks  [] Continue therapy with the following recommended  changes:_____________________      _____________________________________________________________________  [] Discontinue therapy progressing towards or have reached established goals  [] Discontinue therapy due to lack of appreciable progress towards goals  [] Discontinue therapy due to lack of attendance or compliance  [] Await Physician's recommendations/decisions regarding therapy  [] Other:________________________________________________________________    Thank you for this referral.    Cherly Beach Strozier, PT 10/21/2011 2:51 PM    NOTE TO PHYSICIAN:  PLEASE COMPLETE THE ORDERS BELOW AND   FAX TO [facility]: [phone]  If you are unable to process this request in 24 hours please contact our office: [phone]    []   I have read the above report and request that my patient continue as recommended.  []   I have read the above report and request that my patient continue therapy with the following changes/special instructions:________________________________________  [] I have read the above report and request that my patient be discharged from therapy.    Physician???s signature: _____________________________Date: ______Time:_______

## 2011-10-24 NOTE — Progress Notes (Signed)
PT DAILY TREATMENT NOTE     Patient Name: Tiffany [name]  Date:10/24/2011  DOB: [birth year]  [x]   Patient DOB Verified  Payor: BLUE CROSS  [place]  Product Type: PPO     In time:1:00  Out time:1:35  Total Treatment Time (min): 35  Visit #: 8 of 9    Treatment Area: Tear of medial cartilage or meniscus of knee, current [836.0]    SUBJECTIVE  Pain Level (0-10 scale): 4/10  Any medication changes, allergies to medications, adverse drug reactions, diagnosis change, or new procedure performed?: [x]  No    []  Yes (see summary sheet for update)  Subjective functional status/changes:   []  No changes reported  Patient reports that her knee is swollen from doing a lot of driving yesterday.     OBJECTIVE    Therapeutic Exercise: [x]  see flow sheet     []  Other:_ (minutes) :25  Added/Changed Exercises:  []   Added:_  to improve (function):  []   Changed:_ to improve (function):    Therapeutic Activity: balance and dynamic gait  (minutes) :5    Manual Therapy: patellar mobs    (minutes) :5    Patient Education: [x]  Review HEP    []  Progressed/Changed HEP based on:_   []  positioning   []  body mechanics   []  transfers   []  heat/ice application   (minutes) :    Other Objective/Functional Measures: Patient reports increased pain with TherEx. Patient unable to complete step ups due to pain. Held mini-squats due to increased pain. Patient presents with lateral tracking and tilt of patella.    Pain Level (0-10 scale) post treatment: 3/10    ASSESSMENT  []   See Plan of Care  []   See progress note/recertification  [x]   Patient will continue to benefit from skilled therapy to address remaining functional deficits: pain and swelling control, flexibility, strength, balance and gait to increase functional mobility, ease with ADLs and (A) return to PLOF.     Progress towards goals / Updated goals:  Assessed last visit and PN written.     PLAN  [x]   Upgrade activities as tolerated     [x]   Continue plan of care  []    Discharge due to:_  []  Other:_      French Ana, PTA 10/24/2011  1:00 PM

## 2011-10-26 NOTE — Progress Notes (Signed)
PT DAILY TREATMENT NOTE     Patient Name: Tiffany [name]  Date:10/26/2011  DOB: 1970/03/14  [x]   Patient DOB Verified  Payor: BLUE CROSS  [place]  Product Type: PPO     In time:1:00  Out time:1:50  Total Treatment Time (min): 50  Visit #: 1 of 9-12    Treatment Area: Tear of medial cartilage or meniscus of knee, current [836.0]    SUBJECTIVE  Pain Level (0-10 scale): 4-5/10  Any medication changes, allergies to medications, adverse drug reactions, diagnosis change, or new procedure performed?: [x]  No    []  Yes (see summary sheet for update)  Subjective functional status/changes:   []  No changes reported  Patient reports that her knee is still popping and swelling.     OBJECTIVE  Modality rationale:  decrease pain and swelling   10 min [x]  Estim, type: IFC to knee                                         []   att     [x]   unatt     []   w/US     [x]   w/ice    []   w/heat     Therapeutic Exercise: [x]  see flow sheet     []  Other:_ (minutes) :35  Added/Changed Exercises:  []   Added:_  to improve (function):  []   Changed:_ to improve (function):    Manual Therapy: per flow sheet    (minutes) :5    Patient Education: [x]  Review HEP    []  Progressed/Changed HEP based on:_   []  positioning   []  body mechanics   []  transfers   []  heat/ice application   (minutes) :    Other Objective/Functional Measures: Held TherEx per flow sheet due to pain and initiated E-stim/CP trial to decrease pain.    Pain Level (0-10 scale) post treatment: 2/10    ASSESSMENT  []   See Plan of Care  []   See progress note/recertification  [x]   Patient will continue to benefit from skilled therapy to address remaining functional deficits: pain and swelling control, flexibility, ROM, strength and gait to increase ease with ADLs.     Progress towards Updated goals:  # 2 : continues to ambulate with antalgic gait pattern    PLAN  [x]   Upgrade activities as tolerated     [x]   Continue plan of care  []   Discharge due to:_  []  Other:_       French Ana, PTA 10/26/2011  1:08 PM

## 2011-12-20 NOTE — ED Provider Notes (Signed)
I was personally available for consultation in the emergency department.  I have reviewed the chart prior to the patient being discharged and agree with the documentation recorded by the MLP, including the assessment, treatment plan, and disposition.  Geran Haithcock Isaac Jessee Newnam, MD
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
## 2011-12-20 NOTE — ED Notes (Signed)
I have reviewed discharge instructions with the patient.  The patient verbalized understanding.  Patient armband removed and shredded, scripts x 2 given

## 2012-02-08 HISTORY — PX: CHOLECYSTECTOMY: SHX55

## 2012-02-08 HISTORY — PX: DIAGNOSTIC LAPAROSCOPY: SUR761

## 2012-02-08 HISTORY — PX: REPAIR KNEE LIGAMENT: SUR1188

## 2012-02-14 NOTE — Progress Notes (Signed)
[facility] ??? [facility]  [address]  [place], [place] [address]  [phone]   [phone] fax    Discharge Summary    Tiffany [name]        Date: 02/14/2012  28-Jun-1970  [name],*    Diagnosis: R knee pain  Start of Care: 10/26/11  Visits from Start of Care: 9  Missed Visits: 2    Summary of Care: Pt was progressing in PT as discussed in most recent progress note (submitted 10/21/11). Pt cancelled her last scheduled appointment secondary to LE swelling and failed to return for continuation of therapy. Please see PN for most up to date pt status information, as no goals have been formally reassessed since last PN submission. Thank you for this referral.    ASSESSMENT/RECOMMENDATIONS:  [x] Discontinue therapy: [] Patient has reached or is progressing toward set goals      [x] Patient is non-compliant or has abdicated      [] Due to lack of appreciable progress towards set goals    Santa Genera, PT 02/14/2012 11:52 AM  NOTE TO PHYSICIAN:  PLEASE COMPLETE THE ORDERS BELOW AND   FAX TO [facility]: [phone]  If you are unable to process this request in 24 hours please contact our office: [phone]      I have read the above report and request that my patient be discharged from therapy    Other:______________________________________________________________    Physician???s signature: ________________________________Date: _____Time:_____

## 2014-02-07 DIAGNOSIS — I209 Angina pectoris, unspecified: Secondary | ICD-10-CM

## 2014-02-07 HISTORY — DX: Angina pectoris, unspecified: I20.9

## 2015-03-03 ENCOUNTER — Other Ambulatory Visit: Payer: Self-pay | Admitting: Neurosurgery

## 2015-03-18 ENCOUNTER — Encounter (HOSPITAL_COMMUNITY)
Admission: RE | Admit: 2015-03-18 | Discharge: 2015-03-18 | Disposition: A | Discharge status: Home / Self Care | Payer: PRIVATE HEALTH INSURANCE | Source: Ambulatory Visit | Attending: Neurosurgery | Admitting: Neurosurgery

## 2015-03-18 ENCOUNTER — Encounter (HOSPITAL_COMMUNITY): Payer: Self-pay

## 2015-03-18 DIAGNOSIS — Z01812 Encounter for preprocedural laboratory examination: Secondary | ICD-10-CM | POA: Diagnosis present

## 2015-03-18 DIAGNOSIS — M4310 Spondylolisthesis, site unspecified: Secondary | ICD-10-CM | POA: Insufficient documentation

## 2015-03-18 DIAGNOSIS — Z0183 Encounter for blood typing: Secondary | ICD-10-CM | POA: Diagnosis not present

## 2015-03-18 HISTORY — DX: Angina pectoris, unspecified: I20.9

## 2015-03-18 HISTORY — DX: Cardiac arrhythmia, unspecified: I49.9

## 2015-03-18 HISTORY — DX: Unspecified asthma, uncomplicated: J45.909

## 2015-03-18 HISTORY — DX: Unspecified osteoarthritis, unspecified site: M19.90

## 2015-03-18 HISTORY — DX: Gastro-esophageal reflux disease without esophagitis: K21.9

## 2015-03-18 HISTORY — DX: Obesity, unspecified: E66.9

## 2015-03-18 LAB — BASIC METABOLIC PANEL
Anion gap: 11 (ref 5–15)
BUN: 7 mg/dL (ref 6–20)
CHLORIDE: 102 mmol/L (ref 101–111)
CO2: 24 mmol/L (ref 22–32)
Calcium: 9.6 mg/dL (ref 8.9–10.3)
Creatinine, Ser: 0.75 mg/dL (ref 0.44–1.00)
GFR calc Af Amer: >60 mL/min (ref >60)
GFR calc non Af Amer: >60 mL/min (ref >60)
GLUCOSE: 87 mg/dL (ref 65–99)
POTASSIUM: 4 mmol/L (ref 3.5–5.1)
SODIUM: 137 mmol/L (ref 135–145)

## 2015-03-18 LAB — CBC
HEMATOCRIT: 38.3 % (ref 36.0–46.0)
Hemoglobin: 13.1 g/dL (ref 12.0–15.0)
MCH: 29.9 pg (ref 26.0–34.0)
MCHC: 34.2 g/dL (ref 30.0–36.0)
MCV: 87.4 fL (ref 78.0–100.0)
Platelets: 333 10*3/uL (ref 150–400)
RBC: 4.38 MIL/uL (ref 3.87–5.11)
RDW: 12.7 % (ref 11.5–15.5)
WBC: 7.1 10*3/uL (ref 4.0–10.5)

## 2015-03-18 LAB — HCG, SERUM, QUALITATIVE: Preg, Serum: NEGATIVE

## 2015-03-18 LAB — ABO/RH: ABO/RH(D): O POS

## 2015-03-18 LAB — SURGICAL PCR SCREEN
MRSA, PCR: NEGATIVE
Staphylococcus aureus: NEGATIVE

## 2015-03-18 NOTE — Progress Notes (Signed)
Patient's PCP was Frances Nickels NP in [place], but she has recently moved to [place] and patient has not yet contacted a new PCP.    Last year patient did have some chest pain and was referred to [facility] for this.  She states she had an EKG, possibly had an ECHO, and that the doctor cleared her but had wanted her to do a stress test that she wasn't able to do due to work hours.  Have requested last office visit, discharge summary, and EKG/ECHO.

## 2015-03-18 NOTE — Pre-Procedure Instructions (Addendum)
Kristina Estes  03/18/2015      Your procedure is scheduled on Friday February 17th 2017 at 730am .  Report to [facility] Admitting at 530 A.M.  Call this number if you have problems the morning of surgery:  [phone]   Remember:  Do not eat food or drink liquids after midnight Thursday February 16th.  Take these medicines the morning of surgery with A SIP OF WATER gabapentin (neurontin), flexeril (cyclobenzaprine) if needed, oxybutynin if needed  STOP: ALL Vitamins, Supplements, Effient and Herbal Medications, Fish Oils, Aspirins, NSAIDs (Nonsteroidal Anti-inflammatories such as Ibuprofen, Aleve, or Advil), and Goody's/BC Powders 7 days prior to surgery, until after surgery as directed by your physician. <Stop Alka-Estes, Meloxicam, Phentermine tomorrow.>   Do not wear jewelry, make-up or nail polish.  Do not wear lotions, powders, or perfumes.  You may wear deodorant.  Do not shave 48 hours prior to surgery.    Do not bring valuables to the hospital.  [facility] is not responsible for any belongings or valuables.  Contacts, dentures or bridgework may not be worn into surgery.  Leave your suitcase in the car.  After surgery it may be brought to your room.  For patients admitted to the hospital, discharge time will be determined by your treatment team.  Patients discharged the day of surgery will not be allowed to drive home.   Please read over the following fact sheets that you were given. Pain Booklet, Coughing and Deep Breathing, Blood Transfusion Information, MRSA Information and Surgical Site Infection Prevention        Preparing for Surgery at [facility]  Before surgery, you can play an important role.  Because skin is not sterile, your skin needs to be as free of germs as possible.  You can reduce the number of germs on your skin by washing with CHG (chlorahexidine gluconate) Soap before surgery.  CHG is an antiseptic cleaner with kills germs and bonds  with the skin to continue killing germs even after washing.   Please do not use if you have an allergy to CHG or antibacterial soaps.  If your skin becomes reddened/irritated stop using the CHG.  Do not shave (including legs and underarms) for at least 48 hours prior to first CHG shower.  It is okay to shave your face.  Please follow these instructions carefully:  1. Shower with CHG Soap the night before surgery and the morning of Surgery. 2. If you choose to wash your hair, wash your hair first as usual with your normal shampoo. 3. After you shampoo, rinse your hair and body thoroughly to remove the Shampoo. 4. Use CHG as you would any other liquid soap. You can apply chg directly to the skin and wash gently with scrungie or a clean washcloth. 5. Apply the CHG Soap to your body ONLY FROM THE NECK DOWN. Do not use on open wounds or open sores. Avoid contact with your eyes, ears, mouth and genitals (private parts). Wash genitals (private parts) with your normal soap. 6. Wash thoroughly, paying special attention to the area where your surgery will be performed. 7. Thoroughly rinse your body with warm water from the neck down. 8. DO NOT shower/wash with your normal soap after using and rinsing off the CHG Soap. 9. Pat yourself dry with a clean towel.  10. Wear clean pajamas.  11. Place clean sheets on your bed the night of your first shower and do not sleep with pets.  Day  of Surgery  Do not apply any lotions/deodorants the morning of surgery. Please wear clean clothes to the hospital/surgery center.

## 2015-03-23 NOTE — Progress Notes (Signed)
Records received from Cardiology consults.  No EKG received.  Will need one DOS.  I will refer chart to anesthesia to review cardiac notes.

## 2015-03-23 NOTE — Progress Notes (Addendum)
Anesthesia Chart Review: Patient is a 45 year old female scheduled for L5-S1 PLIF on 03/27/15 by Dr. Conchita Paris.   PAT 03/17/14. Chart was brought to me to review today (03/23/15).  History includes cholecystectomy '13, ectopic pregnancy, GERD, childhood asthma, arthritis, T&A, dysrhythmia (not specified).  BMI is consistent with morbid obesity. PCP has been Frances Nickels, NP in [place], [place]; however, she recently moved to [place] and patient has not yet contacted a new PCP. She was referred to cardiologist Dr. Daryel November in 06/2014 for chest pain, possible angina. He recommended a stress and echo. By notes, her EKG showed SR with first degree AVB. He also thought she would benefit from a sleep study at some point. Patient reports she never had the stress test due to work hours.   Meds include Flexeril, Neurontin, Ditropan XL, phentermine (told to hold).  06/27/14 Echo ([facility]): Impressions:  1. LA is dilated. 2. RV is dilated, contracts well. 3. EF 55-60%. 4. Mild TR. 5. TAPSE 20.7   Preoperative labs noted.   No EKG received from Dr. Rondel Baton office, re-requested.  Jessica at Dr. Val Riles office that since patient did not have her recommended stress test last year that she will need cardiac clearance (to determine if this is needed prior to her undergoing lumbar fusion).  Velna Ochs [facility]/Anesthesiology Phone [phone] 03/23/2015 12:09 PM  Addendum: Cardiologist Dr. Daryel November signed a note of cardiac clearance for planned surgery following a normal stress test.  03/24/15 Exercise Stress: Conclusion: No chest pain, normal stress, low risk for ischemia.   03/24/15 EKG: NSR.  Velna Ochs [facility]/Anesthesiology Phone [phone] 03/26/2015 2:28 PM

## 2015-03-26 MED ORDER — DEXTROSE 5 % IV SOLN
3.0000 g | INTRAVENOUS | Status: AC
  Administered 2015-03-27: 3 g via INTRAVENOUS
  Filled 2015-03-26: qty 3000

## 2015-03-27 ENCOUNTER — Inpatient Hospital Stay (HOSPITAL_COMMUNITY): Payer: PRIVATE HEALTH INSURANCE | Admitting: Anesthesiology

## 2015-03-27 ENCOUNTER — Inpatient Hospital Stay (HOSPITAL_COMMUNITY): Payer: PRIVATE HEALTH INSURANCE

## 2015-03-27 ENCOUNTER — Inpatient Hospital Stay (HOSPITAL_COMMUNITY): Payer: PRIVATE HEALTH INSURANCE | Admitting: Vascular Surgery

## 2015-03-27 ENCOUNTER — Inpatient Hospital Stay (HOSPITAL_COMMUNITY): Payer: PRIVATE HEALTH INSURANCE | Admitting: Certified Registered Nurse Anesthetist

## 2015-03-27 ENCOUNTER — Encounter (HOSPITAL_COMMUNITY)
Admission: RE | Disposition: A | Discharge status: Skilled Nursing Facility | Payer: Self-pay | Source: Ambulatory Visit | Attending: Neurosurgery

## 2015-03-27 ENCOUNTER — Inpatient Hospital Stay (HOSPITAL_COMMUNITY)
Admission: RE | Admit: 2015-03-27 | Discharge: 2015-04-28 | DRG: 459 | Disposition: A | Discharge status: Skilled Nursing Facility | Payer: PRIVATE HEALTH INSURANCE | Source: Ambulatory Visit | Attending: Neurosurgery | Admitting: Neurosurgery

## 2015-03-27 ENCOUNTER — Encounter (HOSPITAL_COMMUNITY): Payer: Self-pay | Admitting: *Deleted

## 2015-03-27 DIAGNOSIS — J9601 Acute respiratory failure with hypoxia: Secondary | ICD-10-CM | POA: Diagnosis not present

## 2015-03-27 DIAGNOSIS — M4800 Spinal stenosis, site unspecified: Secondary | ICD-10-CM | POA: Diagnosis present

## 2015-03-27 DIAGNOSIS — J189 Pneumonia, unspecified organism: Secondary | ICD-10-CM | POA: Diagnosis not present

## 2015-03-27 DIAGNOSIS — I82622 Acute embolism and thrombosis of deep veins of left upper extremity: Secondary | ICD-10-CM | POA: Diagnosis not present

## 2015-03-27 DIAGNOSIS — S35516A Injury of unspecified iliac vein, initial encounter: Secondary | ICD-10-CM | POA: Diagnosis not present

## 2015-03-27 DIAGNOSIS — I82412 Acute embolism and thrombosis of left femoral vein: Secondary | ICD-10-CM | POA: Diagnosis not present

## 2015-03-27 DIAGNOSIS — L8990 Pressure ulcer of unspecified site, unspecified stage: Secondary | ICD-10-CM | POA: Insufficient documentation

## 2015-03-27 DIAGNOSIS — D62 Acute posthemorrhagic anemia: Secondary | ICD-10-CM | POA: Diagnosis not present

## 2015-03-27 DIAGNOSIS — S35513A Injury of unspecified iliac artery, initial encounter: Secondary | ICD-10-CM | POA: Diagnosis not present

## 2015-03-27 DIAGNOSIS — Z0189 Encounter for other specified special examinations: Secondary | ICD-10-CM | POA: Insufficient documentation

## 2015-03-27 DIAGNOSIS — R739 Hyperglycemia, unspecified: Secondary | ICD-10-CM | POA: Diagnosis not present

## 2015-03-27 DIAGNOSIS — E440 Moderate protein-calorie malnutrition: Secondary | ICD-10-CM | POA: Diagnosis not present

## 2015-03-27 DIAGNOSIS — M7989 Other specified soft tissue disorders: Secondary | ICD-10-CM

## 2015-03-27 DIAGNOSIS — E46 Unspecified protein-calorie malnutrition: Secondary | ICD-10-CM | POA: Diagnosis not present

## 2015-03-27 DIAGNOSIS — S301XXA Contusion of abdominal wall, initial encounter: Secondary | ICD-10-CM | POA: Diagnosis present

## 2015-03-27 DIAGNOSIS — S36893A Laceration of other intra-abdominal organs, initial encounter: Secondary | ICD-10-CM

## 2015-03-27 DIAGNOSIS — M4317 Spondylolisthesis, lumbosacral region: Principal | ICD-10-CM | POA: Diagnosis present

## 2015-03-27 DIAGNOSIS — Z419 Encounter for procedure for purposes other than remedying health state, unspecified: Secondary | ICD-10-CM

## 2015-03-27 DIAGNOSIS — J9600 Acute respiratory failure, unspecified whether with hypoxia or hypercapnia: Secondary | ICD-10-CM | POA: Insufficient documentation

## 2015-03-27 DIAGNOSIS — M4326 Fusion of spine, lumbar region: Secondary | ICD-10-CM

## 2015-03-27 DIAGNOSIS — K5732 Diverticulitis of large intestine without perforation or abscess without bleeding: Secondary | ICD-10-CM | POA: Diagnosis present

## 2015-03-27 DIAGNOSIS — M1990 Unspecified osteoarthritis, unspecified site: Secondary | ICD-10-CM | POA: Diagnosis present

## 2015-03-27 DIAGNOSIS — R579 Shock, unspecified: Secondary | ICD-10-CM | POA: Diagnosis not present

## 2015-03-27 DIAGNOSIS — S31109A Unspecified open wound of abdominal wall, unspecified quadrant without penetration into peritoneal cavity, initial encounter: Secondary | ICD-10-CM

## 2015-03-27 DIAGNOSIS — R000 Tachycardia, unspecified: Secondary | ICD-10-CM | POA: Diagnosis not present

## 2015-03-27 DIAGNOSIS — K219 Gastro-esophageal reflux disease without esophagitis: Secondary | ICD-10-CM | POA: Diagnosis present

## 2015-03-27 DIAGNOSIS — Z791 Long term (current) use of non-steroidal anti-inflammatories (NSAID): Secondary | ICD-10-CM

## 2015-03-27 DIAGNOSIS — K5900 Constipation, unspecified: Secondary | ICD-10-CM | POA: Diagnosis not present

## 2015-03-27 DIAGNOSIS — K567 Ileus, unspecified: Secondary | ICD-10-CM | POA: Diagnosis not present

## 2015-03-27 DIAGNOSIS — J9602 Acute respiratory failure with hypercapnia: Secondary | ICD-10-CM | POA: Insufficient documentation

## 2015-03-27 DIAGNOSIS — Z01818 Encounter for other preprocedural examination: Secondary | ICD-10-CM

## 2015-03-27 DIAGNOSIS — R1900 Intra-abdominal and pelvic swelling, mass and lump, unspecified site: Secondary | ICD-10-CM

## 2015-03-27 DIAGNOSIS — M549 Dorsalgia, unspecified: Secondary | ICD-10-CM | POA: Diagnosis present

## 2015-03-27 DIAGNOSIS — J9690 Respiratory failure, unspecified, unspecified whether with hypoxia or hypercapnia: Secondary | ICD-10-CM

## 2015-03-27 DIAGNOSIS — Z6841 Body Mass Index (BMI) 40.0 and over, adult: Secondary | ICD-10-CM

## 2015-03-27 DIAGNOSIS — J45909 Unspecified asthma, uncomplicated: Secondary | ICD-10-CM | POA: Diagnosis present

## 2015-03-27 DIAGNOSIS — I82422 Acute embolism and thrombosis of left iliac vein: Secondary | ICD-10-CM | POA: Diagnosis not present

## 2015-03-27 DIAGNOSIS — K661 Hemoperitoneum: Secondary | ICD-10-CM | POA: Diagnosis not present

## 2015-03-27 DIAGNOSIS — Y95 Nosocomial condition: Secondary | ICD-10-CM | POA: Diagnosis not present

## 2015-03-27 DIAGNOSIS — E876 Hypokalemia: Secondary | ICD-10-CM | POA: Diagnosis not present

## 2015-03-27 DIAGNOSIS — R509 Fever, unspecified: Secondary | ICD-10-CM | POA: Diagnosis not present

## 2015-03-27 DIAGNOSIS — E8770 Fluid overload, unspecified: Secondary | ICD-10-CM | POA: Diagnosis not present

## 2015-03-27 DIAGNOSIS — M4306 Spondylolysis, lumbar region: Secondary | ICD-10-CM | POA: Diagnosis present

## 2015-03-27 DIAGNOSIS — I82499 Acute embolism and thrombosis of other specified deep vein of unspecified lower extremity: Secondary | ICD-10-CM | POA: Diagnosis not present

## 2015-03-27 DIAGNOSIS — Z09 Encounter for follow-up examination after completed treatment for conditions other than malignant neoplasm: Secondary | ICD-10-CM

## 2015-03-27 DIAGNOSIS — I82402 Acute embolism and thrombosis of unspecified deep veins of left lower extremity: Secondary | ICD-10-CM

## 2015-03-27 DIAGNOSIS — K660 Peritoneal adhesions (postprocedural) (postinfection): Secondary | ICD-10-CM | POA: Diagnosis present

## 2015-03-27 DIAGNOSIS — Y653 Endotracheal tube wrongly placed during anesthetic procedure: Secondary | ICD-10-CM

## 2015-03-27 DIAGNOSIS — M4807 Spinal stenosis, lumbosacral region: Secondary | ICD-10-CM | POA: Diagnosis present

## 2015-03-27 DIAGNOSIS — R578 Other shock: Secondary | ICD-10-CM | POA: Insufficient documentation

## 2015-03-27 DIAGNOSIS — J9811 Atelectasis: Secondary | ICD-10-CM | POA: Insufficient documentation

## 2015-03-27 DIAGNOSIS — Z789 Other specified health status: Secondary | ICD-10-CM

## 2015-03-27 DIAGNOSIS — J811 Chronic pulmonary edema: Secondary | ICD-10-CM

## 2015-03-27 HISTORY — DX: Other specified health status: Z78.9

## 2015-03-27 HISTORY — PX: ABDOMINAL AORTIC ANEURYSM REPAIR: SHX42

## 2015-03-27 HISTORY — PX: REPAIR ILIAC ARTERY: SHX6216

## 2015-03-27 LAB — CBC
HEMATOCRIT: 26.5 % — AB (ref 36.0–46.0)
HEMATOCRIT: 27 % — AB (ref 36.0–46.0)
HEMOGLOBIN: 9 g/dL — AB (ref 12.0–15.0)
Hemoglobin: 9.2 g/dL — ABNORMAL LOW (ref 12.0–15.0)
MCH: 28.8 pg (ref 26.0–34.0)
MCH: 29.1 pg (ref 26.0–34.0)
MCHC: 34 g/dL (ref 30.0–36.0)
MCHC: 34.1 g/dL (ref 30.0–36.0)
MCV: 84.9 fL (ref 78.0–100.0)
MCV: 85.4 fL (ref 78.0–100.0)
PLATELETS: 94 10*3/uL — AB (ref 150–400)
PLATELETS: 108 10*3/uL — AB (ref 150–400)
RBC: 3.12 MIL/uL — ABNORMAL LOW (ref 3.87–5.11)
RBC: 3.16 MIL/uL — AB (ref 3.87–5.11)
RDW: 14 % (ref 11.5–15.5)
RDW: 13.8 % (ref 11.5–15.5)
WBC: 9.5 10*3/uL (ref 4.0–10.5)
WBC: 8.3 10*3/uL (ref 4.0–10.5)

## 2015-03-27 LAB — CBC WITH DIFFERENTIAL/PLATELET
BASOS ABS: 0 10*3/uL (ref 0.0–0.1)
Basophils Absolute: 0 10*3/uL (ref 0.0–0.1)
Basophils Relative: 0 %
Basophils Relative: 0 %
EOS ABS: 0 10*3/uL (ref 0.0–0.7)
Eosinophils Absolute: 0.1 10*3/uL (ref 0.0–0.7)
Eosinophils Relative: 2 %
Eosinophils Relative: 0 %
HCT: 35.1 % — ABNORMAL LOW (ref 36.0–46.0)
HEMATOCRIT: 22.6 % — AB (ref 36.0–46.0)
HEMOGLOBIN: 12 g/dL (ref 12.0–15.0)
Hemoglobin: 7.4 g/dL — ABNORMAL LOW (ref 12.0–15.0)
LYMPHS ABS: 0.8 10*3/uL (ref 0.7–4.0)
LYMPHS PCT: 37 %
Lymphocytes Relative: 6 %
Lymphs Abs: 2.9 10*3/uL (ref 0.7–4.0)
MCH: 28.5 pg (ref 26.0–34.0)
MCH: 29.3 pg (ref 26.0–34.0)
MCHC: 32.7 g/dL (ref 30.0–36.0)
MCHC: 34.2 g/dL (ref 30.0–36.0)
MCV: 86.9 fL (ref 78.0–100.0)
MCV: 85.6 fL (ref 78.0–100.0)
MONO ABS: 0.5 10*3/uL (ref 0.1–1.0)
MONOS PCT: 6 %
Monocytes Absolute: 0.8 10*3/uL (ref 0.1–1.0)
Monocytes Relative: 6 %
NEUTROS ABS: 4.3 10*3/uL (ref 1.7–7.7)
NEUTROS PCT: 88 %
Neutro Abs: 11.6 10*3/uL — ABNORMAL HIGH (ref 1.7–7.7)
Neutrophils Relative %: 55 %
Platelets: 259 10*3/uL (ref 150–400)
Platelets: 27 10*3/uL — CL (ref 150–400)
RBC: 2.6 MIL/uL — AB (ref 3.87–5.11)
RBC: 4.1 MIL/uL (ref 3.87–5.11)
RDW: 12.5 % (ref 11.5–15.5)
RDW: 14 % (ref 11.5–15.5)
WBC: 7.9 10*3/uL (ref 4.0–10.5)
WBC: 13.2 10*3/uL — ABNORMAL HIGH (ref 4.0–10.5)

## 2015-03-27 LAB — COMPREHENSIVE METABOLIC PANEL
ALT: 11 U/L — AB (ref 14–54)
ALT: 20 U/L (ref 14–54)
AST: 17 U/L (ref 15–41)
AST: 32 U/L (ref 15–41)
Albumin: 1.8 g/dL — ABNORMAL LOW (ref 3.5–5.0)
Albumin: 3 g/dL — ABNORMAL LOW (ref 3.5–5.0)
Alkaline Phosphatase: 40 U/L (ref 38–126)
Alkaline Phosphatase: 41 U/L (ref 38–126)
Anion gap: 10 (ref 5–15)
Anion gap: 10 (ref 5–15)
BILIRUBIN TOTAL: 0.6 mg/dL (ref 0.3–1.2)
BUN: 11 mg/dL (ref 6–20)
BUN: 11 mg/dL (ref 6–20)
CHLORIDE: 116 mmol/L — AB (ref 101–111)
CO2: 18 mmol/L — ABNORMAL LOW (ref 22–32)
CO2: 18 mmol/L — AB (ref 22–32)
Calcium: 8.8 mg/dL — ABNORMAL LOW (ref 8.9–10.3)
Calcium: 7.5 mg/dL — ABNORMAL LOW (ref 8.9–10.3)
Chloride: 108 mmol/L (ref 101–111)
Creatinine, Ser: 0.94 mg/dL (ref 0.44–1.00)
Creatinine, Ser: 0.77 mg/dL (ref 0.44–1.00)
GFR calc Af Amer: >60 mL/min (ref >60)
Glucose, Bld: 273 mg/dL — ABNORMAL HIGH (ref 65–99)
Glucose, Bld: 177 mg/dL — ABNORMAL HIGH (ref 65–99)
POTASSIUM: 4.2 mmol/L (ref 3.5–5.1)
Potassium: 2.8 mmol/L — ABNORMAL LOW (ref 3.5–5.1)
SODIUM: 144 mmol/L (ref 135–145)
Sodium: 136 mmol/L (ref 135–145)
TOTAL PROTEIN: 3.9 g/dL — AB (ref 6.5–8.1)
Total Bilirubin: 2.7 mg/dL — ABNORMAL HIGH (ref 0.3–1.2)
Total Protein: 5.2 g/dL — ABNORMAL LOW (ref 6.5–8.1)

## 2015-03-27 LAB — POCT I-STAT 7, (LYTES, BLD GAS, ICA,H+H)
Acid-base deficit: 4 mmol/L — ABNORMAL HIGH (ref 0.0–2.0)
Acid-base deficit: 6 mmol/L — ABNORMAL HIGH (ref 0.0–2.0)
Bicarbonate: 21.2 mEq/L (ref 20.0–24.0)
Bicarbonate: 21.8 mEq/L (ref 20.0–24.0)
CALCIUM ION: 1.38 mmol/L — AB (ref 1.12–1.23)
Calcium, Ion: 1.49 mmol/L — ABNORMAL HIGH (ref 1.12–1.23)
HCT: 23 % — ABNORMAL LOW (ref 36.0–46.0)
HEMATOCRIT: 24 % — AB (ref 36.0–46.0)
HEMOGLOBIN: 7.8 g/dL — AB (ref 12.0–15.0)
HEMOGLOBIN: 8.2 g/dL — AB (ref 12.0–15.0)
O2 Saturation: 100 %
O2 Saturation: 100 %
PCO2 ART: 48.4 mmHg — AB (ref 35.0–45.0)
PH ART: 7.344 — AB (ref 7.350–7.450)
PO2 ART: 576 mmHg — AB (ref 80.0–100.0)
POTASSIUM: 2.9 mmol/L — AB (ref 3.5–5.1)
POTASSIUM: 3.1 mmol/L — AB (ref 3.5–5.1)
Sodium: 138 mmol/L (ref 135–145)
Sodium: 138 mmol/L (ref 135–145)
TCO2: 22 mmol/L (ref 0–100)
TCO2: 23 mmol/L (ref 0–100)
pCO2 arterial: 39 mmHg (ref 35.0–45.0)
pH, Arterial: 7.252 — ABNORMAL LOW (ref 7.350–7.450)
pO2, Arterial: 555 mmHg — ABNORMAL HIGH (ref 80.0–100.0)

## 2015-03-27 LAB — CK TOTAL AND CKMB (NOT AT ARMC)
CK, MB: 4 ng/mL (ref 0.5–5.0)
Relative Index: INVALID (ref 0.0–2.5)
Total CK: 85 U/L (ref 38–234)

## 2015-03-27 LAB — POCT I-STAT 3, ART BLOOD GAS (G3+)
ACID-BASE DEFICIT: 5 mmol/L — AB (ref 0.0–2.0)
BICARBONATE: 21.6 meq/L (ref 20.0–24.0)
O2 SAT: 94 %
TCO2: 23 mmol/L (ref 0–100)
pCO2 arterial: 43.9 mmHg (ref 35.0–45.0)
pH, Arterial: 7.288 — ABNORMAL LOW (ref 7.350–7.450)
pO2, Arterial: 72 mmHg — ABNORMAL LOW (ref 80.0–100.0)

## 2015-03-27 LAB — DIC (DISSEMINATED INTRAVASCULAR COAGULATION)PANEL
D-Dimer, Quant: 13.28 ug/mL-FEU — ABNORMAL HIGH (ref 0.00–0.50)
INR: 1.46 (ref 0.00–1.49)
Platelets: 97 10*3/uL — ABNORMAL LOW (ref 150–400)
Platelets: 111 10*3/uL — ABNORMAL LOW (ref 150–400)
Smear Review: NONE SEEN

## 2015-03-27 LAB — DIC (DISSEMINATED INTRAVASCULAR COAGULATION) PANEL
APTT: 50 s — AB (ref 24–37)
APTT: 36 s (ref 24–37)
FIBRINOGEN: 320 mg/dL (ref 204–475)
FIBRINOGEN: 455 mg/dL (ref 204–475)
INR: 1.31 (ref 0.00–1.49)
PROTHROMBIN TIME: 17.8 s — AB (ref 11.6–15.2)
PROTHROMBIN TIME: 16.4 s — AB (ref 11.6–15.2)
SMEAR REVIEW: NONE SEEN

## 2015-03-27 LAB — APTT
APTT: 37 s (ref 24–37)
aPTT: 28 seconds (ref 24–37)
aPTT: >200 seconds (ref 24–37)

## 2015-03-27 LAB — FIBRINOGEN: Fibrinogen: 83 mg/dL — CL (ref 204–475)

## 2015-03-27 LAB — MAGNESIUM: MAGNESIUM: 1.2 mg/dL — AB (ref 1.7–2.4)

## 2015-03-27 LAB — GLUCOSE, CAPILLARY: GLUCOSE-CAPILLARY: 162 mg/dL — AB (ref 65–99)

## 2015-03-27 LAB — PROTIME-INR
INR: 1.48 (ref 0.00–1.49)
INR: 3.24 — AB (ref 0.00–1.49)
INR: 1.32 (ref 0.00–1.49)
PROTHROMBIN TIME: 18 s — AB (ref 11.6–15.2)
PROTHROMBIN TIME: 16.5 s — AB (ref 11.6–15.2)
Prothrombin Time: 32.4 seconds — ABNORMAL HIGH (ref 11.6–15.2)

## 2015-03-27 LAB — PREPARE RBC (CROSSMATCH)

## 2015-03-27 LAB — TROPONIN I: TROPONIN I: 0.11 ng/mL — AB (ref <0.031)

## 2015-03-27 LAB — TRIGLYCERIDES: TRIGLYCERIDES: 157 mg/dL — AB (ref <150)

## 2015-03-27 LAB — LACTIC ACID, PLASMA: LACTIC ACID, VENOUS: 3.9 mmol/L — AB (ref 0.5–2.0)

## 2015-03-27 SURGERY — POSTERIOR LUMBAR FUSION 1 LEVEL
Anesthesia: General

## 2015-03-27 SURGERY — ANEURYSM ABDOMINAL AORTIC REPAIR
Anesthesia: General | Laterality: Left

## 2015-03-27 MED ORDER — FENTANYL CITRATE (PF) 250 MCG/5ML IJ SOLN
INTRAMUSCULAR | Status: AC
  Filled 2015-03-27: qty 5

## 2015-03-27 MED ORDER — DOCUSATE SODIUM 100 MG PO CAPS
100.0000 mg | ORAL_CAPSULE | Freq: Every day | ORAL | Status: DC
  Administered 2015-04-06: 100 mg via ORAL
  Filled 2015-03-27: qty 1

## 2015-03-27 MED ORDER — EPINEPHRINE HCL 0.1 MG/ML IJ SOSY
PREFILLED_SYRINGE | INTRAMUSCULAR | Status: DC | PRN
  Administered 2015-03-27: 150 ug via INTRAVENOUS
  Administered 2015-03-27 (×2): 100 ug via INTRAVENOUS
  Administered 2015-03-27: 50 ug via INTRAVENOUS
  Administered 2015-03-27: 100 ug via INTRAVENOUS
  Administered 2015-03-27 (×2): 50 ug via INTRAVENOUS
  Administered 2015-03-27: 100 ug via INTRAVENOUS

## 2015-03-27 MED ORDER — MENTHOL 3 MG MT LOZG: 1.0000 | LOZENGE | OROMUCOSAL | Status: DC | PRN

## 2015-03-27 MED ORDER — PROPOFOL 500 MG/50ML IV EMUL
INTRAVENOUS | Status: DC | PRN
  Administered 2015-03-27: 100 ug/kg/min via INTRAVENOUS

## 2015-03-27 MED ORDER — ALUM & MAG HYDROXIDE-SIMETH 200-200-20 MG/5ML PO SUSP: 15.0000 mL | ORAL | Status: DC | PRN

## 2015-03-27 MED ORDER — MIDAZOLAM HCL 2 MG/2ML IJ SOLN
INTRAMUSCULAR | Status: AC
  Filled 2015-03-27: qty 2

## 2015-03-27 MED ORDER — OXYBUTYNIN CHLORIDE ER 5 MG PO TB24
10.0000 mg | ORAL_TABLET | Freq: Every day | ORAL | Status: DC
  Filled 2015-03-27: qty 1

## 2015-03-27 MED ORDER — ROCURONIUM BROMIDE 50 MG/5ML IV SOLN
INTRAVENOUS | Status: AC
  Filled 2015-03-27: qty 1

## 2015-03-27 MED ORDER — OXYCODONE HCL 5 MG PO TABS: 5.0000 mg | ORAL_TABLET | Freq: Once | ORAL | Status: DC | PRN

## 2015-03-27 MED ORDER — PROTAMINE SULFATE 10 MG/ML IV SOLN
INTRAVENOUS | Status: AC
  Filled 2015-03-27: qty 10

## 2015-03-27 MED ORDER — METOPROLOL TARTRATE 1 MG/ML IV SOLN: 2.0000 mg | INTRAVENOUS | Status: DC | PRN

## 2015-03-27 MED ORDER — FENTANYL CITRATE (PF) 100 MCG/2ML IJ SOLN
50.0000 ug | Freq: Once | INTRAMUSCULAR | Status: AC
  Administered 2015-03-31 (×3): 50 ug via INTRAVENOUS

## 2015-03-27 MED ORDER — CALCIUM CHLORIDE 10 % IV SOLN
INTRAVENOUS | Status: DC | PRN
  Administered 2015-03-27 (×2): 500 mg via INTRAVENOUS

## 2015-03-27 MED ORDER — PHENYLEPHRINE HCL 10 MG/ML IJ SOLN
INTRAMUSCULAR | Status: DC | PRN
  Administered 2015-03-27 (×2): 120 ug via INTRAVENOUS
  Administered 2015-03-27: 160 ug via INTRAVENOUS

## 2015-03-27 MED ORDER — HEMOSTATIC AGENTS (NO CHARGE) OPTIME
TOPICAL | Status: DC | PRN
  Administered 2015-03-27 (×2): 1 via TOPICAL

## 2015-03-27 MED ORDER — SODIUM CHLORIDE 0.9 % IV SOLN
500.0000 mL | Freq: Once | INTRAVENOUS | Status: AC | PRN
  Administered 2015-03-29 (×2): via INTRAVENOUS

## 2015-03-27 MED ORDER — SODIUM CHLORIDE 0.9 % IV SOLN: Freq: Once | INTRAVENOUS | Status: DC

## 2015-03-27 MED ORDER — SODIUM CHLORIDE 0.9 % IJ SOLN
INTRAMUSCULAR | Status: AC
  Filled 2015-03-27: qty 10

## 2015-03-27 MED ORDER — ANTISEPTIC ORAL RINSE SOLUTION (CORINZ)
7.0000 mL | Freq: Four times a day (QID) | OROMUCOSAL | Status: DC
  Administered 2015-03-28 – 2015-04-20 (×85): 7 mL via OROMUCOSAL

## 2015-03-27 MED ORDER — FENTANYL BOLUS VIA INFUSION
50.0000 ug | INTRAVENOUS | Status: DC | PRN
  Administered 2015-03-29: 100 ug via INTRAVENOUS
  Administered 2015-03-29: 200 ug via INTRAVENOUS
  Administered 2015-03-29: 100 ug via INTRAVENOUS
  Administered 2015-04-01 – 2015-04-07 (×4): 50 ug via INTRAVENOUS
  Filled 2015-03-27 (×4): qty 50

## 2015-03-27 MED ORDER — SODIUM BICARBONATE 8.4 % IV SOLN
INTRAVENOUS | Status: AC
  Filled 2015-03-27: qty 50

## 2015-03-27 MED ORDER — 0.9 % SODIUM CHLORIDE (POUR BTL) OPTIME
TOPICAL | Status: DC | PRN
  Administered 2015-03-27: 1000 mL

## 2015-03-27 MED ORDER — PROMETHAZINE HCL 25 MG/ML IJ SOLN: 6.2500 mg | INTRAMUSCULAR | Status: DC | PRN

## 2015-03-27 MED ORDER — VECURONIUM BROMIDE 10 MG IV SOLR
INTRAVENOUS | Status: DC | PRN
  Administered 2015-03-27 (×2): 5 mg via INTRAVENOUS

## 2015-03-27 MED ORDER — SENNA 8.6 MG PO TABS: 1.0000 | ORAL_TABLET | Freq: Two times a day (BID) | ORAL | Status: DC

## 2015-03-27 MED ORDER — BUPIVACAINE HCL (PF) 0.5 % IJ SOLN
INTRAMUSCULAR | Status: DC | PRN
  Administered 2015-03-27: 10 mL

## 2015-03-27 MED ORDER — HYDRALAZINE HCL 20 MG/ML IJ SOLN: 5.0000 mg | INTRAMUSCULAR | Status: DC | PRN

## 2015-03-27 MED ORDER — SUFENTANIL CITRATE 50 MCG/ML IV SOLN
INTRAVENOUS | Status: AC
  Filled 2015-03-27: qty 1

## 2015-03-27 MED ORDER — POLYETHYLENE GLYCOL 3350 17 G PO PACK: 17.0000 g | PACK | Freq: Every day | ORAL | Status: DC | PRN

## 2015-03-27 MED ORDER — HYDROMORPHONE HCL 1 MG/ML IJ SOLN: 0.5000 mg | INTRAMUSCULAR | Status: DC | PRN

## 2015-03-27 MED ORDER — ONDANSETRON HCL 4 MG/2ML IJ SOLN
INTRAMUSCULAR | Status: AC
  Filled 2015-03-27: qty 2

## 2015-03-27 MED ORDER — NOREPINEPHRINE BITARTRATE 1 MG/ML IV SOLN
4000.0000 ug | INTRAVENOUS | Status: DC | PRN
  Administered 2015-03-27: 2.5 ug/min via INTRAVENOUS

## 2015-03-27 MED ORDER — HYDROMORPHONE HCL 1 MG/ML IJ SOLN
INTRAMUSCULAR | Status: DC | PRN
  Administered 2015-03-27: 2 mg via INTRAVENOUS

## 2015-03-27 MED ORDER — EPINEPHRINE HCL 1 MG/ML IJ SOLN
4000.0000 ug | INTRAVENOUS | Status: DC | PRN
  Administered 2015-03-27: 2.5 ug/min via INTRAVENOUS

## 2015-03-27 MED ORDER — CYCLOBENZAPRINE HCL 10 MG PO TABS: 10.0000 mg | ORAL_TABLET | Freq: Three times a day (TID) | ORAL | Status: DC | PRN

## 2015-03-27 MED ORDER — SODIUM CHLORIDE 0.9 % IV SOLN
25.0000 ug/h | INTRAVENOUS | Status: DC
  Administered 2015-03-27: 100 ug/h via INTRAVENOUS
  Administered 2015-03-28 – 2015-03-29 (×3): 200 ug/h via INTRAVENOUS
  Administered 2015-03-30: 150 ug/h via INTRAVENOUS
  Administered 2015-03-30 – 2015-03-31 (×2): 200 ug/h via INTRAVENOUS
  Administered 2015-04-01: 100 ug/h via INTRAVENOUS
  Administered 2015-04-01 – 2015-04-02 (×2): 200 ug/h via INTRAVENOUS
  Administered 2015-04-02: 175 ug/h via INTRAVENOUS
  Administered 2015-04-03 – 2015-04-04 (×2): 200 ug/h via INTRAVENOUS
  Administered 2015-04-04: 175 ug/h via INTRAVENOUS
  Administered 2015-04-05 (×2): 200 ug/h via INTRAVENOUS
  Administered 2015-04-06 – 2015-04-07 (×2): 150 ug/h via INTRAVENOUS
  Administered 2015-04-07 – 2015-04-09 (×5): 200 ug/h via INTRAVENOUS
  Administered 2015-04-09: 350 ug/h via INTRAVENOUS
  Filled 2015-03-27 (×25): qty 50

## 2015-03-27 MED ORDER — HEPARIN SODIUM (PORCINE) 1000 UNIT/ML IJ SOLN
INTRAMUSCULAR | Status: DC | PRN
  Administered 2015-03-27: 14000 [IU] via INTRAVENOUS
  Administered 2015-03-27 (×2): 2000 [IU] via INTRAVENOUS

## 2015-03-27 MED ORDER — DEXTROSE 5 % IV SOLN: 1.5000 g | Freq: Two times a day (BID) | INTRAVENOUS | Status: DC

## 2015-03-27 MED ORDER — CEFAZOLIN SODIUM-DEXTROSE 2-3 GM-% IV SOLR
INTRAVENOUS | Status: DC | PRN
  Administered 2015-03-27: 2 g via INTRAVENOUS
  Administered 2015-03-27: 1 g via INTRAVENOUS
  Administered 2015-03-27: 3 g via INTRAVENOUS

## 2015-03-27 MED ORDER — PROPOFOL 1000 MG/100ML IV EMUL
0.0000 ug/kg/min | INTRAVENOUS | Status: DC
  Administered 2015-03-27: 50 ug/kg/min via INTRAVENOUS
  Administered 2015-03-27: 80 ug/kg/min via INTRAVENOUS
  Administered 2015-03-28 – 2015-03-29 (×16): 50 ug/kg/min via INTRAVENOUS
  Administered 2015-03-30: 35 ug/kg/min via INTRAVENOUS
  Administered 2015-03-30: 40 ug/kg/min via INTRAVENOUS
  Administered 2015-03-30: 50 ug/kg/min via INTRAVENOUS
  Administered 2015-03-30: 35 ug/kg/min via INTRAVENOUS
  Administered 2015-03-30: 37.23 ug/kg/min via INTRAVENOUS
  Administered 2015-03-30: 35 ug/kg/min via INTRAVENOUS
  Administered 2015-03-31: 40 ug/kg/min via INTRAVENOUS
  Administered 2015-03-31: 30 ug/kg/min via INTRAVENOUS
  Administered 2015-03-31 (×2): 40 ug/kg/min via INTRAVENOUS
  Administered 2015-03-31: 30 ug/kg/min via INTRAVENOUS
  Administered 2015-03-31 – 2015-04-01 (×4): 40 ug/kg/min via INTRAVENOUS
  Filled 2015-03-27 (×2): qty 200
  Filled 2015-03-27 (×7): qty 100
  Filled 2015-03-27: qty 200
  Filled 2015-03-27 (×9): qty 100
  Filled 2015-03-27 (×2): qty 200
  Filled 2015-03-27 (×3): qty 100
  Filled 2015-03-27: qty 200
  Filled 2015-03-27 (×3): qty 100

## 2015-03-27 MED ORDER — HEPARIN SODIUM (PORCINE) 1000 UNIT/ML IJ SOLN
INTRAMUSCULAR | Status: AC
  Filled 2015-03-27: qty 2

## 2015-03-27 MED ORDER — POTASSIUM CHLORIDE CRYS ER 20 MEQ PO TBCR: 20.0000 meq | EXTENDED_RELEASE_TABLET | Freq: Every day | ORAL | Status: DC | PRN

## 2015-03-27 MED ORDER — MORPHINE SULFATE (PF) 2 MG/ML IV SOLN: 2.0000 mg | INTRAVENOUS | Status: DC | PRN

## 2015-03-27 MED ORDER — NOREPINEPHRINE BITARTRATE 1 MG/ML IV SOLN
0.0000 ug/min | INTRAVENOUS | Status: DC
  Administered 2015-03-27: 8 ug/min via INTRAVENOUS
  Filled 2015-03-27: qty 16

## 2015-03-27 MED ORDER — CHLORHEXIDINE GLUCONATE 0.12% ORAL RINSE (MEDLINE KIT)
15.0000 mL | Freq: Two times a day (BID) | OROMUCOSAL | Status: DC
  Administered 2015-03-27 – 2015-04-20 (×45): 15 mL via OROMUCOSAL

## 2015-03-27 MED ORDER — PANTOPRAZOLE SODIUM 40 MG IV SOLR
40.0000 mg | Freq: Every day | INTRAVENOUS | Status: DC
  Administered 2015-03-27 – 2015-04-11 (×16): 40 mg via INTRAVENOUS
  Filled 2015-03-27 (×17): qty 40

## 2015-03-27 MED ORDER — FLEET ENEMA 7-19 GM/118ML RE ENEM
1.0000 | ENEMA | Freq: Once | RECTAL | Status: DC | PRN
  Filled 2015-03-27: qty 1

## 2015-03-27 MED ORDER — LACTATED RINGERS IV SOLN
INTRAVENOUS | Status: DC | PRN
  Administered 2015-03-27 (×2): via INTRAVENOUS

## 2015-03-27 MED ORDER — DOCUSATE SODIUM 100 MG PO CAPS: 100.0000 mg | ORAL_CAPSULE | Freq: Two times a day (BID) | ORAL | Status: DC

## 2015-03-27 MED ORDER — GUAIFENESIN-DM 100-10 MG/5ML PO SYRP: 15.0000 mL | ORAL_SOLUTION | ORAL | Status: DC | PRN

## 2015-03-27 MED ORDER — HYDROMORPHONE HCL 1 MG/ML IJ SOLN
INTRAMUSCULAR | Status: AC
  Filled 2015-03-27: qty 2

## 2015-03-27 MED ORDER — PROPOFOL 10 MG/ML IV BOLUS
INTRAVENOUS | Status: AC
  Filled 2015-03-27: qty 20

## 2015-03-27 MED ORDER — PROTAMINE SULFATE 10 MG/ML IV SOLN
INTRAVENOUS | Status: DC | PRN
  Administered 2015-03-27: 30 mg via INTRAVENOUS
  Administered 2015-03-27: 60 mg via INTRAVENOUS

## 2015-03-27 MED ORDER — LABETALOL HCL 5 MG/ML IV SOLN
10.0000 mg | INTRAVENOUS | Status: DC | PRN
  Administered 2015-03-28 (×2): 10 mg via INTRAVENOUS
  Filled 2015-03-27: qty 4

## 2015-03-27 MED ORDER — OXYMETAZOLINE HCL 0.05 % NA SOLN
NASAL | Status: DC | PRN
  Administered 2015-03-27: 2 via NASAL

## 2015-03-27 MED ORDER — MAGNESIUM SULFATE 2 GM/50ML IV SOLN
2.0000 g | Freq: Every day | INTRAVENOUS | Status: AC | PRN
  Administered 2015-03-28: 2 g via INTRAVENOUS
  Filled 2015-03-27: qty 50

## 2015-03-27 MED ORDER — DEXAMETHASONE SODIUM PHOSPHATE 4 MG/ML IJ SOLN
INTRAMUSCULAR | Status: AC
  Filled 2015-03-27: qty 1

## 2015-03-27 MED ORDER — PHENOL 1.4 % MT LIQD: 1.0000 | OROMUCOSAL | Status: DC | PRN

## 2015-03-27 MED ORDER — OXYCODONE-ACETAMINOPHEN 5-325 MG PO TABS: 1.0000 | ORAL_TABLET | ORAL | Status: DC | PRN

## 2015-03-27 MED ORDER — ROCURONIUM BROMIDE 100 MG/10ML IV SOLN
INTRAVENOUS | Status: DC | PRN
  Administered 2015-03-27: 100 mg via INTRAVENOUS
  Administered 2015-03-27: 50 mg via INTRAVENOUS

## 2015-03-27 MED ORDER — VASOPRESSIN 20 UNIT/ML IV SOLN
INTRAVENOUS | Status: DC | PRN
  Administered 2015-03-27: 3 [IU] via INTRAVENOUS
  Administered 2015-03-27: 1 [IU] via INTRAVENOUS
  Administered 2015-03-27 (×2): 2 [IU] via INTRAVENOUS
  Administered 2015-03-27: 3 [IU] via INTRAVENOUS
  Administered 2015-03-27: 2 [IU] via INTRAVENOUS
  Administered 2015-03-27: 3 [IU] via INTRAVENOUS
  Administered 2015-03-27: 4 [IU] via INTRAVENOUS

## 2015-03-27 MED ORDER — CALCIUM CHLORIDE 10 % IV SOLN
INTRAVENOUS | Status: DC | PRN
  Administered 2015-03-27 (×2): 200 mg via INTRAVENOUS
  Administered 2015-03-27: 600 mg via INTRAVENOUS
  Administered 2015-03-27: 1000 mg via INTRAVENOUS

## 2015-03-27 MED ORDER — SODIUM CHLORIDE 0.9 % IV SOLN: 250.0000 mL | INTRAVENOUS | Status: DC

## 2015-03-27 MED ORDER — ENOXAPARIN SODIUM 40 MG/0.4ML ~~LOC~~ SOLN
40.0000 mg | SUBCUTANEOUS | Status: DC
  Administered 2015-03-28: 40 mg via SUBCUTANEOUS
  Filled 2015-03-27: qty 0.4

## 2015-03-27 MED ORDER — SODIUM CHLORIDE 0.9 % IV SOLN
INTRAVENOUS | Status: DC
  Administered 2015-03-27 – 2015-03-28 (×13): via INTRAVENOUS
  Administered 2015-03-29: 75 mL/h via INTRAVENOUS

## 2015-03-27 MED ORDER — METHOCARBAMOL 1000 MG/10ML IJ SOLN
500.0000 mg | Freq: Four times a day (QID) | INTRAMUSCULAR | Status: DC | PRN
  Filled 2015-03-27: qty 5

## 2015-03-27 MED ORDER — SUCCINYLCHOLINE CHLORIDE 20 MG/ML IJ SOLN
INTRAMUSCULAR | Status: AC
  Filled 2015-03-27: qty 1

## 2015-03-27 MED ORDER — MIDAZOLAM HCL 5 MG/5ML IJ SOLN
INTRAMUSCULAR | Status: DC | PRN
  Administered 2015-03-27 (×2): 4 mg via INTRAVENOUS
  Administered 2015-03-27 (×3): 2 mg via INTRAVENOUS

## 2015-03-27 MED ORDER — SODIUM CHLORIDE 0.9% FLUSH
3.0000 mL | Freq: Two times a day (BID) | INTRAVENOUS | Status: DC
  Administered 2015-03-28: 3 mL via INTRAVENOUS
  Administered 2015-03-28 – 2015-03-29 (×2): 10 mL via INTRAVENOUS
  Administered 2015-03-30 – 2015-04-05 (×10): 3 mL via INTRAVENOUS

## 2015-03-27 MED ORDER — ONDANSETRON HCL 4 MG/2ML IJ SOLN: 4.0000 mg | INTRAMUSCULAR | Status: DC | PRN

## 2015-03-27 MED ORDER — THROMBIN 20000 UNITS EX SOLR
CUTANEOUS | Status: DC | PRN
  Administered 2015-03-27: 07:00:00 via TOPICAL

## 2015-03-27 MED ORDER — CEFAZOLIN SODIUM-DEXTROSE 2-3 GM-% IV SOLR
2.0000 g | Freq: Three times a day (TID) | INTRAVENOUS | Status: AC
  Administered 2015-03-27: 2 g via INTRAVENOUS
  Filled 2015-03-27 (×2): qty 50

## 2015-03-27 MED ORDER — OXYMETAZOLINE HCL 0.05 % NA SOLN
NASAL | Status: AC
  Filled 2015-03-27: qty 15

## 2015-03-27 MED ORDER — LIDOCAINE HCL (CARDIAC) 20 MG/ML IV SOLN
INTRAVENOUS | Status: DC | PRN
  Administered 2015-03-27: 100 mg via INTRAVENOUS

## 2015-03-27 MED ORDER — EPINEPHRINE HCL 1 MG/ML IJ SOLN
0.5000 ug/min | INTRAVENOUS | Status: DC
  Administered 2015-03-27: 5 ug/min via INTRAVENOUS
  Filled 2015-03-27: qty 4

## 2015-03-27 MED ORDER — ROCURONIUM BROMIDE 100 MG/10ML IV SOLN
INTRAVENOUS | Status: DC | PRN
  Administered 2015-03-27: 20 mg via INTRAVENOUS
  Administered 2015-03-27: 50 mg via INTRAVENOUS
  Administered 2015-03-27: 30 mg via INTRAVENOUS
  Administered 2015-03-27: 50 mg via INTRAVENOUS

## 2015-03-27 MED ORDER — SUFENTANIL CITRATE 50 MCG/ML IV SOLN
INTRAVENOUS | Status: DC | PRN
  Administered 2015-03-27: 10 ug via INTRAVENOUS
  Administered 2015-03-27: 25 ug via INTRAVENOUS
  Administered 2015-03-27: 10 ug via INTRAVENOUS
  Administered 2015-03-27 (×2): 20 ug via INTRAVENOUS
  Administered 2015-03-27: 10 ug via INTRAVENOUS
  Administered 2015-03-27: 5 ug via INTRAVENOUS

## 2015-03-27 MED ORDER — HYDROCORTISONE NA SUCCINATE PF 100 MG IJ SOLR
INTRAMUSCULAR | Status: DC | PRN
  Administered 2015-03-27: 100 mg via INTRAVENOUS

## 2015-03-27 MED ORDER — METHOCARBAMOL 500 MG PO TABS
500.0000 mg | ORAL_TABLET | Freq: Four times a day (QID) | ORAL | Status: DC | PRN
  Filled 2015-03-27: qty 1

## 2015-03-27 MED ORDER — SODIUM CHLORIDE 0.9 % IR SOLN
Status: DC | PRN
  Administered 2015-03-27: 08:00:00

## 2015-03-27 MED ORDER — SODIUM CHLORIDE 0.9 % IV SOLN
INTRAVENOUS | Status: DC | PRN
  Administered 2015-03-27: 500 mL

## 2015-03-27 MED ORDER — MIDAZOLAM HCL 2 MG/2ML IJ SOLN
INTRAMUSCULAR | Status: AC
  Filled 2015-03-27: qty 4

## 2015-03-27 MED ORDER — OXYCODONE HCL 5 MG/5ML PO SOLN: 5.0000 mg | Freq: Once | ORAL | Status: DC | PRN

## 2015-03-27 MED ORDER — SUFENTANIL CITRATE 50 MCG/ML IV SOLN
INTRAVENOUS | Status: DC | PRN
  Administered 2015-03-27 (×5): 10 ug via INTRAVENOUS

## 2015-03-27 MED ORDER — CEFAZOLIN SODIUM 1-5 GM-% IV SOLN
INTRAVENOUS | Status: AC
  Filled 2015-03-27: qty 50

## 2015-03-27 MED ORDER — GABAPENTIN 300 MG PO CAPS: 300.0000 mg | ORAL_CAPSULE | Freq: Three times a day (TID) | ORAL | Status: DC

## 2015-03-27 MED ORDER — FENTANYL CITRATE (PF) 100 MCG/2ML IJ SOLN
INTRAMUSCULAR | Status: DC | PRN
  Administered 2015-03-27: 100 ug via INTRAVENOUS
  Administered 2015-03-27: 150 ug via INTRAVENOUS

## 2015-03-27 MED ORDER — ALBUMIN HUMAN 5 % IV SOLN
INTRAVENOUS | Status: DC | PRN
  Administered 2015-03-27: 20:00:00 via INTRAVENOUS

## 2015-03-27 MED ORDER — HYDROMORPHONE HCL 1 MG/ML IJ SOLN: 0.2500 mg | INTRAMUSCULAR | Status: DC | PRN

## 2015-03-27 MED ORDER — LIDOCAINE-EPINEPHRINE 1 %-1:100000 IJ SOLN
INTRAMUSCULAR | Status: DC | PRN
  Administered 2015-03-27: 10 mL

## 2015-03-27 MED ORDER — SODIUM CHLORIDE 0.9% FLUSH: 3.0000 mL | INTRAVENOUS | Status: DC | PRN

## 2015-03-27 MED ORDER — ACETAMINOPHEN 325 MG PO TABS: 650.0000 mg | ORAL_TABLET | ORAL | Status: DC | PRN

## 2015-03-27 MED ORDER — SODIUM BICARBONATE 8.4 % IV SOLN
INTRAVENOUS | Status: DC | PRN
  Administered 2015-03-27: 50 meq via INTRAVENOUS

## 2015-03-27 MED ORDER — ACETAMINOPHEN 650 MG RE SUPP: 650.0000 mg | RECTAL | Status: DC | PRN

## 2015-03-27 MED ORDER — PROPOFOL 10 MG/ML IV BOLUS
INTRAVENOUS | Status: DC | PRN
  Administered 2015-03-27: 200 mg via INTRAVENOUS

## 2015-03-27 MED ORDER — THROMBIN 5000 UNITS EX SOLR
OROMUCOSAL | Status: DC | PRN
  Administered 2015-03-27 (×2): via TOPICAL

## 2015-03-27 MED ORDER — LACTATED RINGERS IV SOLN
INTRAVENOUS | Status: DC | PRN
  Administered 2015-03-27 (×4): via INTRAVENOUS

## 2015-03-27 MED ORDER — VECURONIUM BROMIDE 10 MG IV SOLR
INTRAVENOUS | Status: AC
  Filled 2015-03-27: qty 10

## 2015-03-27 MED ORDER — 0.9 % SODIUM CHLORIDE (POUR BTL) OPTIME
TOPICAL | Status: DC | PRN
  Administered 2015-03-27: 3000 mL

## 2015-03-27 MED ORDER — DEXAMETHASONE SODIUM PHOSPHATE 4 MG/ML IJ SOLN
INTRAMUSCULAR | Status: DC | PRN
  Administered 2015-03-27: 4 mg via INTRAVENOUS

## 2015-03-27 MED ORDER — MIDAZOLAM HCL 5 MG/5ML IJ SOLN
INTRAMUSCULAR | Status: DC | PRN
  Administered 2015-03-27: 4 mg via INTRAVENOUS

## 2015-03-27 SURGICAL SUPPLY — 69 items
BAG DECANTER FOR FLEXI CONT (MISCELLANEOUS) ×3 IMPLANT
BENZOIN TINCTURE PRP APPL 2/3 (GAUZE/BANDAGES/DRESSINGS) IMPLANT
BIT DRILL 3.5 POWEREASE (BIT) ×2 IMPLANT
BIT DRILL 3.5MM POWEREASE (BIT) ×1
BLADE CLIPPER SURG (BLADE) IMPLANT
BLADE SURG 11 STRL SS (BLADE) ×3 IMPLANT
BUR MATCHSTICK NEURO 3.0 LAGG (BURR) ×3 IMPLANT
BUR PRECISION FLUTE 5.0 (BURR) ×3 IMPLANT
CANISTER SUCT 3000ML PPV (MISCELLANEOUS) ×3 IMPLANT
CLOSURE WOUND 1/2 X4 (GAUZE/BANDAGES/DRESSINGS)
CONT SPEC 4OZ CLIKSEAL STRL BL (MISCELLANEOUS) ×3 IMPLANT
COVER BACK TABLE 60X90IN (DRAPES) ×3 IMPLANT
DECANTER SPIKE VIAL GLASS SM (MISCELLANEOUS) ×3 IMPLANT
DRAPE C-ARM 42X72 X-RAY (DRAPES) ×3 IMPLANT
DRAPE C-ARMOR (DRAPES) ×3 IMPLANT
DRAPE LAPAROTOMY 100X72X124 (DRAPES) ×3 IMPLANT
DRAPE POUCH INSTRU U-SHP 10X18 (DRAPES) ×3 IMPLANT
DRAPE SURG 17X23 STRL (DRAPES) ×3 IMPLANT
DURAPREP 26ML APPLICATOR (WOUND CARE) ×3 IMPLANT
ELECT BLADE 4.0 EZ CLEAN MEGAD (MISCELLANEOUS) ×3
ELECT REM PT RETURN 9FT ADLT (ELECTROSURGICAL) ×3
ELECTRODE BLDE 4.0 EZ CLN MEGD (MISCELLANEOUS) ×1 IMPLANT
ELECTRODE REM PT RTRN 9FT ADLT (ELECTROSURGICAL) ×1 IMPLANT
GAUZE SPONGE 4X4 12PLY STRL (GAUZE/BANDAGES/DRESSINGS) ×3 IMPLANT
GAUZE SPONGE 4X4 16PLY XRAY LF (GAUZE/BANDAGES/DRESSINGS) IMPLANT
GLOVE BIOGEL PI IND STRL 7.5 (GLOVE) ×1 IMPLANT
GLOVE BIOGEL PI INDICATOR 7.5 (GLOVE) ×2
GLOVE ECLIPSE 7.0 STRL STRAW (GLOVE) ×3 IMPLANT
GLOVE ECLIPSE 8.5 STRL (GLOVE) ×3 IMPLANT
GLOVE EXAM NITRILE LRG STRL (GLOVE) IMPLANT
GLOVE EXAM NITRILE MD LF STRL (GLOVE) IMPLANT
GLOVE EXAM NITRILE XL STR (GLOVE) IMPLANT
GLOVE EXAM NITRILE XS STR PU (GLOVE) IMPLANT
GLOVE INDICATOR 7.0 STRL GRN (GLOVE) ×9 IMPLANT
GLOVE INDICATOR 7.5 STRL GRN (GLOVE) ×9 IMPLANT
GLOVE INDICATOR 8.5 STRL (GLOVE) ×3 IMPLANT
GLOVE SS N UNI LF 7.0 STRL (GLOVE) ×12 IMPLANT
GOWN STRL REUS W/ TWL LRG LVL3 (GOWN DISPOSABLE) ×2 IMPLANT
GOWN STRL REUS W/ TWL XL LVL3 (GOWN DISPOSABLE) IMPLANT
GOWN STRL REUS W/TWL 2XL LVL3 (GOWN DISPOSABLE) ×3 IMPLANT
GOWN STRL REUS W/TWL LRG LVL3 (GOWN DISPOSABLE) ×4
GOWN STRL REUS W/TWL XL LVL3 (GOWN DISPOSABLE)
HEMOSTAT POWDER KIT SURGIFOAM (HEMOSTASIS) ×6 IMPLANT
KIT BASIN OR (CUSTOM PROCEDURE TRAY) ×3 IMPLANT
KIT INFUSE X SMALL 1.4CC (Orthopedic Implant) IMPLANT
KIT ROOM TURNOVER OR (KITS) ×3 IMPLANT
LIQUID BAND (GAUZE/BANDAGES/DRESSINGS) IMPLANT
MILL MEDIUM DISP (BLADE) ×3 IMPLANT
NEEDLE HYPO 18GX1.5 BLUNT FILL (NEEDLE) IMPLANT
NEEDLE HYPO 25X1 1.5 SAFETY (NEEDLE) ×3 IMPLANT
NEEDLE SPNL 18GX3.5 QUINCKE PK (NEEDLE) ×3 IMPLANT
NS IRRIG 1000ML POUR BTL (IV SOLUTION) ×3 IMPLANT
PACK LAMINECTOMY NEURO (CUSTOM PROCEDURE TRAY) ×3 IMPLANT
PAD ARMBOARD 7.5X6 YLW CONV (MISCELLANEOUS) ×9 IMPLANT
SEALER EPIDURAL VEIN MINI EVS (INSTRUMENTS) ×3 IMPLANT
SPONGE LAP 4X18 X RAY DECT (DISPOSABLE) IMPLANT
SPONGE SURGIFOAM ABS GEL 100 (HEMOSTASIS) ×3 IMPLANT
STAPLER VISISTAT 35W (STAPLE) ×3 IMPLANT
STRIP BIOACTIVE VITOSS 25X52X4 (Orthopedic Implant) IMPLANT
STRIP CLOSURE SKIN 1/2X4 (GAUZE/BANDAGES/DRESSINGS) IMPLANT
SUT VIC AB 0 CT1 18XCR BRD8 (SUTURE) ×2 IMPLANT
SUT VIC AB 0 CT1 8-18 (SUTURE) ×4
SUT VICRYL 3-0 RB1 18 ABS (SUTURE) ×6 IMPLANT
SYR 3ML LL SCALE MARK (SYRINGE) IMPLANT
TOWEL OR 17X24 6PK STRL BLUE (TOWEL DISPOSABLE) ×3 IMPLANT
TOWEL OR 17X26 10 PK STRL BLUE (TOWEL DISPOSABLE) ×3 IMPLANT
TRAP SPECIMEN MUCOUS 40CC (MISCELLANEOUS) ×3 IMPLANT
TRAY FOLEY W/METER SILVER 14FR (SET/KITS/TRAYS/PACK) ×3 IMPLANT
WATER STERILE IRR 1000ML POUR (IV SOLUTION) ×3 IMPLANT

## 2015-03-27 SURGICAL SUPPLY — 65 items
CANISTER SUCTION 2500CC (MISCELLANEOUS) ×2 IMPLANT
CANISTER WOUND CARE 500ML ATS (WOUND CARE) ×4 IMPLANT
CLIP TI LARGE 6 (CLIP) ×2 IMPLANT
CLIP TI MEDIUM 24 (CLIP) ×2 IMPLANT
CLIP TI WIDE RED SMALL 24 (CLIP) ×2 IMPLANT
COVER PROBE W GEL 5X96 (DRAPES) ×2 IMPLANT
DRSG VAC ATS SM SENSATRAC (GAUZE/BANDAGES/DRESSINGS) ×2 IMPLANT
ELECT BLADE 4.0 EZ CLEAN MEGAD (MISCELLANEOUS) ×4
ELECT BLADE 6.5 EXT (BLADE) ×2 IMPLANT
ELECT CAUTERY BLADE 6.4 (BLADE) ×2 IMPLANT
ELECT REM PT RETURN 9FT ADLT (ELECTROSURGICAL) ×2
ELECTRODE BLDE 4.0 EZ CLN MEGD (MISCELLANEOUS) ×2 IMPLANT
ELECTRODE REM PT RTRN 9FT ADLT (ELECTROSURGICAL) ×1 IMPLANT
GLOVE BIO SURGEON STRL SZ 6.5 (GLOVE) ×10 IMPLANT
GLOVE BIO SURGEON STRL SZ7 (GLOVE) ×2 IMPLANT
GLOVE BIOGEL PI IND STRL 6.5 (GLOVE) ×2 IMPLANT
GLOVE BIOGEL PI IND STRL 7.5 (GLOVE) ×3 IMPLANT
GLOVE BIOGEL PI INDICATOR 6.5 (GLOVE) ×2
GLOVE BIOGEL PI INDICATOR 7.5 (GLOVE) ×3
GLOVE ECLIPSE 6.5 STRL STRAW (GLOVE) ×2 IMPLANT
GLOVE ECLIPSE 7.0 STRL STRAW (GLOVE) ×2 IMPLANT
GLOVE SS BIOGEL STRL SZ 7 (GLOVE) ×1 IMPLANT
GLOVE SUPERSENSE BIOGEL SZ 7 (GLOVE) ×1
GLOVE SURG SS PI 6.0 STRL IVOR (GLOVE) ×8 IMPLANT
GLOVE SURG SS PI 7.0 STRL IVOR (GLOVE) ×6 IMPLANT
GOWN SPEC L4 XLG W/TWL (GOWN DISPOSABLE) ×2 IMPLANT
GOWN STRL NON-REIN LRG LVL3 (GOWN DISPOSABLE) ×4 IMPLANT
GOWN STRL REUS W/ TWL LRG LVL3 (GOWN DISPOSABLE) ×6 IMPLANT
GOWN STRL REUS W/TWL LRG LVL3 (GOWN DISPOSABLE) ×6
GRAFT HEMASHIELD 8MM (Vascular Products) ×1 IMPLANT
GRAFT VASC STRG 30X8KNIT (Vascular Products) ×1 IMPLANT
HEMOSTAT SPONGE AVITENE ULTRA (HEMOSTASIS) ×4 IMPLANT
INSERT FOGARTY 61MM (MISCELLANEOUS) ×4 IMPLANT
INSERT FOGARTY SM (MISCELLANEOUS) ×4 IMPLANT
KIT BASIN OR (CUSTOM PROCEDURE TRAY) ×2 IMPLANT
KIT ROOM TURNOVER OR (KITS) ×2 IMPLANT
NS IRRIG 1000ML POUR BTL (IV SOLUTION) ×4 IMPLANT
PACK AORTA (CUSTOM PROCEDURE TRAY) ×2 IMPLANT
PAD ARMBOARD 7.5X6 YLW CONV (MISCELLANEOUS) ×4 IMPLANT
PENCIL BUTTON HOLSTER BLD 10FT (ELECTRODE) ×2 IMPLANT
RETAINER VISCERA MED (MISCELLANEOUS) ×2 IMPLANT
SPONGE ABDOMINAL VAC ABTHERA (MISCELLANEOUS) ×2 IMPLANT
SPONGE LAP 18X18 X RAY DECT (DISPOSABLE) ×6 IMPLANT
SPONGE SURGIFOAM ABS GEL 100 (HEMOSTASIS) IMPLANT
SPONGE TONSIL 1 RF SGL (DISPOSABLE) ×4 IMPLANT
STAPLER VISISTAT 35W (STAPLE) ×4 IMPLANT
SUT ETHIBOND 5 LR DA (SUTURE) IMPLANT
SUT PDS AB 1 TP1 96 (SUTURE) ×4 IMPLANT
SUT PROLENE 2 0 MH 48 (SUTURE) IMPLANT
SUT PROLENE 3 0 SH 48 (SUTURE) ×4 IMPLANT
SUT PROLENE 3 0 SH1 36 (SUTURE) IMPLANT
SUT PROLENE 4 0 RB 1 (SUTURE) ×4
SUT PROLENE 4-0 RB1 .5 CRCL 36 (SUTURE) ×4 IMPLANT
SUT PROLENE 5 0 C 1 24 (SUTURE) ×2 IMPLANT
SUT PROLENE 5 0 C 1 36 (SUTURE) ×6 IMPLANT
SUT PROLENE 6 0 BV (SUTURE) ×2 IMPLANT
SUT SILK 2 0 SH CR/8 (SUTURE) ×2 IMPLANT
SUT VIC AB 2-0 CT1 27 (SUTURE) ×6
SUT VIC AB 2-0 CT1 36 (SUTURE) ×4 IMPLANT
SUT VIC AB 2-0 CT1 TAPERPNT 27 (SUTURE) ×6 IMPLANT
SUT VIC AB 3-0 SH 27 (SUTURE) ×1
SUT VIC AB 3-0 SH 27X BRD (SUTURE) ×1 IMPLANT
TOWEL BLUE STERILE X RAY DET (MISCELLANEOUS) ×4 IMPLANT
TRAY FOLEY W/METER SILVER 16FR (SET/KITS/TRAYS/PACK) ×2 IMPLANT
WATER STERILE IRR 1000ML POUR (IV SOLUTION) ×4 IMPLANT

## 2015-03-27 NOTE — Anesthesia Preprocedure Evaluation (Addendum)
Anesthesia Evaluation  Patient identified by MRN, date of birth, ID band Patient awake    Reviewed: Allergy & Precautions, H&P , NPO status , Patient's Chart, lab work & pertinent test results  History of Anesthesia Complications (+) DIFFICULT IV STICK / SPECIAL LINE and history of anesthetic complications  Airway Mallampati: II  TM Distance: >3 FB Neck ROM: full    Dental  (+) Partial Lower, Partial Upper, Dental Advisory Given   Pulmonary asthma ,    Pulmonary exam normal breath sounds clear to auscultation       Cardiovascular (-) anginaNormal cardiovascular exam Rhythm:regular Rate:Normal  03/24/15 Exercise Stress: Conclusion: No chest pain, normal stress, low risk for ischemia.    Neuro/Psych negative neurological ROS     GI/Hepatic Neg liver ROS, GERD  ,  Endo/Other  Morbid obesity  Renal/GU negative Renal ROS     Musculoskeletal  (+) Arthritis ,   Abdominal (+) + obese,   Peds  Hematology negative hematology ROS (+)   Anesthesia Other Findings   Reproductive/Obstetrics negative OB ROS                          Anesthesia Physical Anesthesia Plan  ASA: III  Anesthesia Plan: General   Post-op Pain Management:    Induction: Intravenous  Airway Management Planned: Oral ETT  Additional Equipment:   Intra-op Plan:   Post-operative Plan: Possible Post-op intubation/ventilation  Informed Consent: I have reviewed the patients History and Physical, chart, labs and discussed the procedure including the risks, benefits and alternatives for the proposed anesthesia with the patient or authorized representative who has indicated his/her understanding and acceptance.   Dental Advisory Given and Dental advisory given  Plan Discussed with: Anesthesiologist, CRNA and Surgeon  Anesthesia Plan Comments: (Patient is super morbidly obese with lumbar spondylolisthesis)       Anesthesia  Quick Evaluation

## 2015-03-27 NOTE — Anesthesia Preprocedure Evaluation (Signed)
Anesthesia Evaluation  Patient identified by MRN, date of birth, ID band Patient awake    Reviewed: Allergy & Precautions, H&P , NPO status , Patient's Chart, lab work & pertinent test results  History of Anesthesia Complications (+) DIFFICULT IV STICK / SPECIAL LINE and history of anesthetic complications  Airway Mallampati: II  TM Distance: >3 FB Neck ROM: full    Dental  (+) Partial Lower, Partial Upper, Dental Advisory Given   Pulmonary asthma ,    Pulmonary exam normal breath sounds clear to auscultation       Cardiovascular (-) anginaNormal cardiovascular exam Rhythm:regular Rate:Normal  03/24/15 Exercise Stress: Conclusion: No chest pain, normal stress, low risk for ischemia.    Neuro/Psych negative neurological ROS     GI/Hepatic Neg liver ROS, GERD  ,  Endo/Other  Morbid obesity  Renal/GU negative Renal ROS     Musculoskeletal  (+) Arthritis ,   Abdominal (+) + obese,   Peds  Hematology negative hematology ROS (+)   Anesthesia Other Findings   Reproductive/Obstetrics negative OB ROS                             Anesthesia Physical  Anesthesia Plan  ASA: IV and emergent  Anesthesia Plan: General   Post-op Pain Management:    Induction: Intravenous  Airway Management Planned: Oral ETT  Additional Equipment: CVP and Arterial line  Intra-op Plan:   Post-operative Plan: Possible Post-op intubation/ventilation  Informed Consent: I have reviewed the patients History and Physical, chart, labs and discussed the procedure including the risks, benefits and alternatives for the proposed anesthesia with the patient or authorized representative who has indicated his/her understanding and acceptance.   Dental advisory given  Plan Discussed with: CRNA  Anesthesia Plan Comments:         Anesthesia Quick Evaluation

## 2015-03-27 NOTE — Consult Note (Signed)
Referred by:  Dr. Conchita Paris  Reason for referral: Retroperitoneal hematoma   History of Present Illness  Kristina Estes is a 45 y.o. ([birth year]) female who presents with chief complaint: acute hypotension.  This patient is intubated and sedated so history is obtained from the patient's family and Careers adviser.  This patient was in the process of a L5/S1 spine procedure when the was acute onset of hypotension.  There was no blood in the field.  The patient was closed emergently and taken to the Neuro ICU.  The patient has been at least 2 u pRBC transfused and requiring vasopressor support.     Past Medical History  Diagnosis Date  . Anginal pain (HCC) 2016    received referral to [facility]  . Dysrhythmia   . Asthma     as a child  . GERD (gastroesophageal reflux disease)   . Arthritis   . Obesity   . Difficult intravenous access 03/27/2015    Patient has no PIV targets, will require central line or PICC anytime general anesthesia is needed    Past Surgical History  Procedure Laterality Date  . Ectopic pregnancy surgery  1998    [place], [place]   . Carpal tunnel release Bilateral 2004    [facility]  . Cholecystectomy  2014    [facility] in [place]  . Diagnostic laparoscopy  2014  . Repair knee ligament Right 2014    [place], [place]  . Tonsillectomy    . Tonsillectomy and adenoidectomy  1998  . Fracture surgery      left foot   . Wisdom tooth extraction    . Colonoscopy      Social History   Social History  . Marital Status: Married    Spouse Name: N/A  . Number of Children: N/A  . Years of Education: N/A   Occupational History  . Not on file.   Social History Main Topics  . Smoking status: Never Smoker   . Smokeless tobacco: Not on file  . Alcohol Use: No  . Drug Use: No  . Sexual Activity: Not on file   Other Topics Concern  . Not on file   Social History Narrative    Family History: cannot be obtained as patient  sedated and intubated   Current Facility-Administered Medications  Medication Dose Route Frequency Provider Last Rate Last Dose  . 0.9 %  sodium chloride infusion   Intravenous Continuous Lisbeth Renshaw, MD 75 mL/hr at 03/27/15 1330    . 0.9 %  sodium chloride infusion  250 mL Intravenous Continuous Lisbeth Renshaw, MD      . 0.9 %  sodium chloride infusion   Intravenous Once Rogelia Boga, CRNA      . acetaminophen (TYLENOL) tablet 650 mg  650 mg Oral Q4H PRN Lisbeth Renshaw, MD       Or  . acetaminophen (TYLENOL) suppository 650 mg  650 mg Rectal Q4H PRN Lisbeth Renshaw, MD      . ceFAZolin (ANCEF) IVPB 2 g/50 mL premix  2 g Intravenous Q8H Lisbeth Renshaw, MD      . docusate sodium (COLACE) capsule 100 mg  100 mg Oral BID Lisbeth Renshaw, MD   100 mg at 03/27/15 1330  . EPINEPHrine (ADRENALIN) 4 mg in dextrose 5 % 250 mL (0.016 mg/mL) infusion  0.5-20 mcg/min Intravenous Titrated Lisbeth Renshaw, MD 9.4 mL/hr at 03/27/15 1335 2.5 mcg/min at 03/27/15 1335  . gabapentin (NEURONTIN) capsule 300 mg  300 mg Oral TID Lisbeth Renshaw, MD      . HYDROmorphone (DILAUDID) injection 0.5-1 mg  0.5-1 mg Intravenous Q2H PRN Lisbeth Renshaw, MD      . menthol-cetylpyridinium (CEPACOL) lozenge 3 mg  1 lozenge Oral PRN Lisbeth Renshaw, MD       Or  . phenol (CHLORASEPTIC) mouth spray 1 spray  1 spray Mouth/Throat PRN Lisbeth Renshaw, MD      . methocarbamol (ROBAXIN) tablet 500 mg  500 mg Oral Q6H PRN Lisbeth Renshaw, MD       Or  . methocarbamol (ROBAXIN) 500 mg in dextrose 5 % 50 mL IVPB  500 mg Intravenous Q6H PRN Lisbeth Renshaw, MD      . norepinephrine (LEVOPHED) 16 mg in dextrose 5 % 250 mL (0.064 mg/mL) infusion  0-40 mcg/min Intravenous Continuous Lisbeth Renshaw, MD 5.6 mL/hr at 03/27/15 1335 6 mcg/min at 03/27/15 1335  . ondansetron (ZOFRAN) injection 4 mg  4 mg Intravenous Q4H PRN Lisbeth Renshaw, MD      . oxybutynin (DITROPAN-XL) 24 hr tablet 10 mg  10 mg  Oral QHS Lisbeth Renshaw, MD      . oxyCODONE-acetaminophen (PERCOCET/ROXICET) 5-325 MG per tablet 1-2 tablet  1-2 tablet Oral Q4H PRN Lisbeth Renshaw, MD      . pantoprazole (PROTONIX) injection 40 mg  40 mg Intravenous QHS Lisbeth Renshaw, MD      . polyethylene glycol (MIRALAX / GLYCOLAX) packet 17 g  17 g Oral Daily PRN Lisbeth Renshaw, MD      . senna (SENOKOT) tablet 8.6 mg  1 tablet Oral BID Lisbeth Renshaw, MD   8.6 mg at 03/27/15 1330  . sodium chloride flush (NS) 0.9 % injection 3 mL  3 mL Intravenous Q12H Lisbeth Renshaw, MD      . sodium chloride flush (NS) 0.9 % injection 3 mL  3 mL Intravenous PRN Lisbeth Renshaw, MD      . sodium phosphate (FLEET) 7-19 GM/118ML enema 1 enema  1 enema Rectal Once PRN Lisbeth Renshaw, MD         Allergies  Allergen Reactions  . Propoxyphene Other (See Comments)    REVIEW OF SYSTEMS:  (Positives checked otherwise negative)  Cannot be obtained as sedated and intubated   Physical Examination  Filed Vitals:   03/27/15 0625 03/27/15 1300 03/27/15 1330 03/27/15 1345  BP: 128/70     Pulse: 78  110 108  Temp: 97.7 F (36.5 C) 97.4 F (36.3 C)    TempSrc: Oral Axillary    Resp: 18  14 15   Height: 5\' 5"  (1.651 m)     Weight: 296 lb (134.265 kg)     SpO2: 100%  100% 100%   Body mass index is 49.26 kg/(m^2).  General: intubated and sedate, obese  Head: [place]/AT  Ear/Nose/Throat: nares w/o erythema or drainage, oropharynx cannot be examined as intubated  Eyes: dilated pupils, somewhat reactive, EOM cannot be tested  Neck: not tested as sedated and intubated  Pulmonary: BLL rales, mechanical vent sounds  Cardiac: RRR, Nl S1, S2, no Murmurs, rubs or gallops  Vascular: Vessel Right Left  Radial Faintly Palpable Faintly Palpable  Brachial Faintly Palpable Faintly Palpable  Carotid Palpable, without bruit Palpable, without bruit  Aorta Not palpable N/A  Femoral Palpable Palpable  Popliteal Not palpable Not palpable    PT Not Palpable Not Palpable  DP Not Palpable Not Palpable   Gastrointestinal: soft, no grimace to palpation, ND, no G/R, no HSM, no masses, no CVAT  B  Musculoskeletal: Cannot test M/S as sedate and intubate, Extremities without ischemic changes   Neurologic: cannot test as sedated and intubated   Psychiatric: cannot test as sedated and intubated   Dermatologic: See M/S exam for extremity exam, no rashes otherwise noted on quick survey  Lymph : No Cervical, Axillary, or Inguinal lymphadenopathy   Laboratory: CBC:    Component Value Date/Time   WBC 7.9 03/27/2015 1210   RBC 2.60* 03/27/2015 1210   HGB 7.4* 03/27/2015 1210   HCT 22.6* 03/27/2015 1210   PLT 259 03/27/2015 1210   MCV 86.9 03/27/2015 1210   MCH 28.5 03/27/2015 1210   MCHC 32.7 03/27/2015 1210   RDW 12.5 03/27/2015 1210   LYMPHSABS 2.9 03/27/2015 1210   MONOABS 0.5 03/27/2015 1210   EOSABS 0.1 03/27/2015 1210   BASOSABS 0.0 03/27/2015 1210    BMP:    Component Value Date/Time   NA 136 03/27/2015 1210   K 2.8* 03/27/2015 1210   CL 108 03/27/2015 1210   CO2 18* 03/27/2015 1210   GLUCOSE 273* 03/27/2015 1210   BUN 11 03/27/2015 1210   CREATININE 0.94 03/27/2015 1210   CALCIUM 8.8* 03/27/2015 1210   GFRNONAA >60 03/27/2015 1210   GFRAA >60 03/27/2015 1210    Coagulation: Lab Results  Component Value Date   INR 1.48 03/27/2015   No results found for: PTT  Lipids: No results found for: CHOL, TRIG, HDL, CHOLHDL, VLDL, LDLCALC, LDLDIRECT   Radiology: Ct Abdomen Pelvis Wo Contrast  03/27/2015  CLINICAL DATA:  45 year old who became acutely hypotensive intraoperatively during L5-S1 posterior decompression and discectomy earlier today. EXAM: CT ABDOMEN AND PELVIS WITHOUT CONTRAST TECHNIQUE: Multidetector CT imaging of the abdomen and pelvis was performed following the standard protocol without IV contrast. COMPARISON:  None. FINDINGS: Lower chest: Blurred by respiratory motion. Mild dependent  atelectasis posteriorly in the lower lobes. Visualized lung bases otherwise clear. Normal heart size. Hepatobiliary: Beam hardening streak artifact as the patient was unable to raise the arms. Allowing for this, normal unenhanced appearance of the liver. Gallbladder surgically absent. No unexpected biliary ductal dilation. Pancreas: Normal unenhanced appearance. Spleen: Normal unenhanced appearance. Adrenals/Urinary Tract: Normal appearing adrenal glands. Unenhanced appearance of the right kidney. Acute perinephric and subcapsular hematoma involving the left kidney. No visible focal parenchymal abnormality involving the left kidney, allowing for the unenhanced technique. Urinary bladder decompressed by Foley catheter. Stomach/Bowel: Stomach normal in appearance for the degree of distention. Normal-appearing small bowel. Normal-appearing colon with expected stool burden. Normal appendix in the right upper pelvis. Vascular/Lymphatic: No visible aortoiliofemoral atherosclerosis. No pathologic lymphadenopathy. Reproductive: Normal-appearing uterus and ovaries without evidence of adnexal mass. Other: Large retroperitoneal hematoma involving predominantly the left side of the abdomen and pelvis, extending from the level of the upper pole of the left kidney inferiorly to the low pelvis, maximum measurements approximating 7.4 x 9.9 x 17.0 cm. The largest portion of the hematoma is anterior to the left psoas muscle. There is a smaller component of the retroperitoneal hematoma in the right side of the abdomen. The retroperitoneal hematoma displaces the left kidney anteriorly. A small amount of intraperitoneal hemorrhage is present in the paracolic gutters, left greater than right. Musculoskeletal: Postsurgical changes related to L5-S1 discectomy and posterior decompression at L5. Gas bubbles in the surgical tract, the epidural space and the L5-S1 disc. No abnormal fluid collection to suggest abscess or leak. IMPRESSION: 1.  Large retroperitoneal hematoma involving predominantly the left side of the abdomen and pelvis, maximum measurements given  above. 2. Subcapsular left renal hematoma and left perinephric hematoma, contiguous with the retroperitoneal hematoma. 3. Small amount of intraperitoneal hemorrhage in the paracolic gutters, left greater than right. 4. Expected postsurgical changes at L5-S1. Given the fact that the patient has a subcapsular left renal hematoma and a left perinephric hematoma, the possible etiology of the retroperitoneal hematoma may be due to a lesion in the left kidney. Repeat CT of the abdomen and pelvis with contrast may be helpful once the patient has recovered from surgery to exclude that possibility. These results were discussed directly with Dr. Conchita Paris at the time of interpretation 03/27/2015 at 1 o'clock p.m. Electronically Signed   By: Hulan Saas M.D.   On: 03/27/2015 13:27   US Abdomen Limited  03/27/2015  CLINICAL DATA:  Intraoperative exam to assess for abdominal/pelvic fluid/ hematoma. CT scan after this ultrasound reveals large retroperitoneal hematoma left-greater-than-right as well as left perinephric and subscapular hematoma of which Dr.Nundkumar is aware. EXAM: LIMITED ABDOMINAL ULTRASOUND COMPARISON:  CT 03/27/2015 FINDINGS: Limited all intraoperative ultrasound evaluation of the bilateral lower quadrants demonstrates no definite focal free fluid collection. IMPRESSION: No definite focal fluid collection within the lower quadrants. Electronically Signed   By: Elberta Fortis M.D.   On: 03/27/2015 14:13   Dg C-arm 1-60 Min-no Report  03/27/2015  CLINICAL DATA: plif 5-s1 C-ARM 1-60 MINUTES Fluoroscopy was utilized by the requesting physician.  No radiographic interpretation.    Medical Decision Making  Dustin Burrill is a 45 y.o. female who presents with: hemorrhagic shock, RPH, possible arterial injury s/p L5/S1 spinal surgery.   Given the patient's unstable status, large  RPH, and drop in H/H, I doubt this patient can be managed non-operatively.  I discussed with the patient's husband, that I felt that emergently exploring the patient's abdominal and repairing any aortic or iliac injuries was indicated in this case. The patient is aware the risks of this procedure include but are not limited to: bleeding, need for transfusion, infection, death, stroke, paralysis, wound complications, bowel injuries, bowel ischemia, extended ventilation and future ventral hernias and bowel obstructions.    Thank you for allowing [place] to participate in this patient's care.   Leonides Sake, MD [facility] Office: [phone] Pager: [phone]  03/27/2015, 2:11 PM

## 2015-03-27 NOTE — Transfer of Care (Signed)
Immediate Anesthesia Transfer of Care Note  Patient: Kristina Estes  Procedure(s) Performed: Procedure(s) with comments: Lumbar five sacral one decompresssion and microdiscectomy (N/A) - Lumbar five sacral one decompresssion and microdiscectomy  Patient Location: ICU  Anesthesia Type:General  Level of Consciousness: unresponsive and Patient remains intubated per anesthesia plan  Airway & Oxygen Therapy: Patient remains intubated per anesthesia plan and Patient placed on Ventilator (see vital sign flow sheet for setting)  Post-op Assessment: Report given to RN and Post -op Vital signs reviewed and stable  Post vital signs: Reviewed and stable  Last Vitals:  Filed Vitals:   03/27/15 0625  BP: 128/70  Pulse: 78  Temp: 36.5 C  Resp: 18    Complications: No apparent anesthesia complications

## 2015-03-27 NOTE — Progress Notes (Signed)
We have completed non-contrast CT abd/pelvis. I reviewed the CT with radiology. It demonstrates a large left-sided retroperitoneal hematoma ventral to the psoas muscle which extends up toward the left kidney and there is actually also subcapsular renal hematoma which is contiguous with the retroperitoneal hematoma.   At this point, the patient is maintaining BP and HR, has received 3U PRBC and is on epi and nor-epi. I have spoken with Dr. Imogene Burn from vascular surgery to see the patient to decide if this hematoma will require operative intervention or more conservative care. We will continue to transfuse another 2 units and cont IVF. Her labs were reviewed, mild hypokalemia will be corrected, mild Troponin bump not likely indicative of cardiac ischemia, and slightly elevated INR. May need FFP as we continue to transfuse.

## 2015-03-27 NOTE — Progress Notes (Signed)
OR team and vascular surgeon at bedside to transport to OR. Procedure explained by vascular surgeon, Dr. Imogene Burn, and consent signed by patients husband.

## 2015-03-27 NOTE — Anesthesia Procedure Notes (Signed)
Central Venous Catheter Insertion Performed by: anesthesiologist Patient location: Pre-op. Preanesthetic checklist: patient identified, IV checked, site marked, risks and benefits discussed, surgical consent, monitors and equipment checked, pre-op evaluation, timeout performed and anesthesia consent Position: Trendelenburg Lidocaine 1% used for infiltration Landmarks identified and Seldinger technique used Catheter size: 8.5 Fr Central line was placed.Sheath introducer Swan type and PA catheter depth:thermodilationProcedure performed using ultrasound guided technique. Attempts: 1 Following insertion, line sutured, dressing applied and Biopatch. Post procedure assessment: blood return through all ports, free fluid flow and no air. Patient tolerated the procedure well with no immediate complications.         

## 2015-03-27 NOTE — Transfer of Care (Signed)
Immediate Anesthesia Transfer of Care Note  Patient: Kristina Estes  Procedure(s) Performed: Procedure(s): Repair Left Common Iliac Vein, Ligation of Left Iliac Artery, Left Common Ileo-Femoral Bypass Graft (Left) REPAIR ILIAC ARTERY with graft from Left Common Iliac to Left Femoral Artery (Left)  Patient Location: SICU  Anesthesia Type:General  Level of Consciousness: Patient remains intubated per anesthesia plan  Airway & Oxygen Therapy: Patient remains intubated per anesthesia plan and Patient placed on Ventilator (see vital sign flow sheet for setting)  Post-op Assessment: Report given to RN and Post -op Vital signs reviewed and stable  Post vital signs: Reviewed and stable  Last Vitals:  Filed Vitals:   03/27/15 1400 03/27/15 1415  BP: 106/67   Pulse: 106 106  Temp:    Resp: 15 28    Complications: No apparent anesthesia complications

## 2015-03-27 NOTE — Progress Notes (Signed)
Utilization review completed. Luma Clopper, RN, BSN. 

## 2015-03-27 NOTE — Anesthesia Postprocedure Evaluation (Addendum)
Anesthesia Post Note  Patient: [name]  Procedure(s) Performed: Procedure(s) (LRB): Lumbar five sacral one decompresssion and microdiscectomy (N/A)  Patient location during evaluation: SICU Anesthesia Type: General Level of consciousness: sedated Pain management: pain level controlled Vital Signs Assessment: post-procedure vital signs reviewed and stable Respiratory status: patient remains intubated per anesthesia plan Cardiovascular status: blood pressure returned to baseline and stable Postop Assessment: no signs of nausea or vomiting Anesthetic complications: no Comments: Please see intraop record for details..Acute intraop drop in blood pressure, was acutely treated for either anaphylaxis or hemorrhage with fluids, vasopressor support, electrolyte replacement and rapid cessation of the procedure. Intraop Echo performed and found a well functioning right and left ventricle, no signs of pulmonary embolism, normal functioning and empty left ventricle with grossly normal MV and AV. RV function was brisk, there was no PA dilation. No sign of aortic dissection in proximal ascending and proximal descending aorta that could be visualized from the TEE. Taken from OR to CT scanner and then to ICU. Abdominal hematoma discovered and primary team to discuss with vascular surgery over potential management. Patient is intubated, bilateral breath sounds, normal peak inspiratory pressures, Epi and NE both at 5 mcg/min, HR 110 and sinus. Sedation has been maintained with midazolam during period of significant hypotension.     Last Vitals:  Filed Vitals:   03/27/15 1330 03/27/15 1345  BP:    Pulse: 110 108  Temp:    Resp: 14 15    Last Pain:  Filed Vitals:   03/27/15 1349  PainSc: 4                  Kristina Estes

## 2015-03-27 NOTE — H&P (Signed)
CC:  Back and leg pain  HPI: Kristina Estes is a 45 year old woman I'm seeing for a primary complaint of about 4 months of progressively worsening left greater than right back pain, and left-sided leg pain. She describes the pain significantly worsening when she first gets up in the morning, although it is fairly constant, and worsened with movement during the day as well. She is having pain primarily on the left side of her back above the buttock, with radiation into the legs. She says she gets the pain in the front and back of her leg, even some below the knee. She does have some radiation of the pain across the lower back into the right side, but does not have any right leg pain. She has not noted any changes in bowel or bladder function. She does get a burning type pain in her back, and does experience some numbness of the left leg. Since this started, she has seen her primary care physician, and has undergone a course of physical therapy which she says did not provide any help. She has also visited a chiropractor which did not provide any help. She has gotten 3 injections which she says were done with x-ray guidance, she thinks epidural steroid injections. These only provided about a day and a half of relief before return of the pain. She was referred to see Dr. Orpah Greek in [facility], who did recommend lumbar fusion surgery, but unfortunately Dr. Orpah Greek took ill, and has been in the hospital unable to do her surgery. She was therefore referred to our office for evaluation and treatment.   PMH: Past Medical History  Diagnosis Date  . Anginal pain (HCC) 2016    received referral to [facility]  . Dysrhythmia   . Asthma     as a child  . GERD (gastroesophageal reflux disease)   . Arthritis   . Obesity     PSH: Past Surgical History  Procedure Laterality Date  . Ectopic pregnancy surgery  1998    [place], [place]   . Carpal tunnel release Bilateral 2004    [facility]  . Cholecystectomy  2014    [facility] in [place]  . Diagnostic laparoscopy  2014  . Repair knee ligament Right 2014    [place], [place]  . Tonsillectomy    . Tonsillectomy and adenoidectomy  1998  . Fracture surgery      left foot   . Wisdom tooth extraction    . Colonoscopy      SH: Social History  Substance Use Topics  . Smoking status: Never Smoker   . Smokeless tobacco: None  . Alcohol Use: No    MEDS: Prior to Admission medications   Medication Sig Start Date End Date Taking? Authorizing Provider  aspirin-sod bicarb-citric acid (ALKA-SELTZER) 325 MG TBEF tablet Take 325 mg by mouth every 6 (six) hours as needed (heartburn).   Yes Historical Provider, MD  cyclobenzaprine (FLEXERIL) 10 MG tablet Take 10 mg by mouth 3 (three) times daily as needed for muscle spasms.   Yes Historical Provider, MD  gabapentin (NEURONTIN) 300 MG capsule Take 300 mg by mouth 3 (three) times daily.   Yes Historical Provider, MD  meloxicam (MOBIC) 15 MG tablet Take 15 mg by mouth daily.   Yes Historical Provider, MD  oxybutynin (DITROPAN-XL) 5 MG 24 hr tablet Take 10 mg by mouth at bedtime. 01/26/15  Yes Historical Provider, MD  phentermine (ADIPEX-P) 37.5 MG tablet Take 37.5 mg  by mouth daily before breakfast. Reported on 03/18/2015    Historical Provider, MD    ALLERGY: Allergies  Allergen Reactions  . Propoxyphene Other (See Comments)    ROS: ROS  NEUROLOGIC EXAM: Awake, alert, oriented Memory and concentration grossly intact Speech fluent, appropriate CN grossly intact Motor exam: Upper Extremities Deltoid Bicep Tricep Grip  Right 5/5 5/5 5/5 5/5  Left 5/5 5/5 5/5 5/5   Lower Extremity IP Quad PF DF EHL  Right 5/5 5/5 5/5 5/5 5/5  Left 5/5 5/5 5/5 5/5 5/5   Sensation grossly intact to LT  [facility]: MRI of the lumbar spine dated 10/08/14 was reviewed. This demonstrates normal lumbar lordosis. Disc space height is relatively well maintained. There is asymmetric  left greater than right facet arthropathy at L4 L5, without any significant neural compression. At L5-S1 there is bilateral facet arthropathy, with mild left-sided foraminal stenosis.   AP and lateral lumbar spine x-rays with flexion and extension views were reviewed. These demonstrate grade 1, mobile anterolisthesis of L5 on S1. This mobility is not seen on the supine MRI and CT scan suggesting significant mobility at this level.   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 Although the recumbent MRI only demonstrates mild foraminal stenosis on the left, this could certainly be worsened given the mobility of the spondylolisthesis in the upright position. She has failed a reasonable attempt at conservative treatment.  PLAN: - We will plan on proceeding with surgical stabilization via posterior lumbar interbody fusion at L5-S1   I did review the x-ray, CT, and MRI findings with the patient and her husband. I splinted him that at this point, they have exhausted reasonable conservative treatments, and the next step would be surgical stabilization. I explained to the patient the details of the procedure, as well as the risks which include but are not limited to nerve root injury leading to leg or foot weakness and or bowel and bladder dysfunction, CSF leak, bleeding, and infection. Possible outcomes of surgery were also discussed including the possibility of uncomplicated surgery but persistence of pain symptoms and the possiblity of accelerated adjacent level degeneration. The general risks of anesthesia were also reviewed including heart attack, stroke, and DVT/PE.   The patient understood our discussion and is willing to proceed with surgical stabilization and fusion. All her questions were answered.

## 2015-03-27 NOTE — Progress Notes (Signed)
eLink Physician-Brief Progress Note Patient Name: Kristina Estes DOB: 06/17/1970 MRN: [id number]   Date of Service  03/27/2015  HPI/Events of Note  No orders for sedation/vent  eICU Interventions  Plan: Vent orders placed Sedation with cont propofol/fentanyl F/U ABG and PCXR     Intervention Category Major Interventions: Other:  DETERDING,ELIZABETH 03/27/2015, 9:46 PM

## 2015-03-27 NOTE — Consult Note (Addendum)
PULMONARY / CRITICAL CARE MEDICINE   Name: Kristina Estes MRN: [id number] DOB: [birth year]    ADMISSION DATE:  03/27/2015 CONSULTATION DATE:  03/27/15  REFERRING MD:  Sharion Dove  CHIEF COMPLAINT:  Hemorrhagic shock  HISTORY OF PRESENT ILLNESS:   Kristina Estes is a 45 year old with history of asthma, arthritis, obesity. She had an elective admission today for L5 laminectomy. Intra-Op she went into shock, hemodynamic instability. Subsequent CT showed large left retroperitoneal hematoma. She was put on 2 pressors (norepi, epinephrine) and transferred to ICU. PCCM consulted for help with management. Vascular surgery was also consulted. Intra OP TEE showed normal cardiac function.  PAST MEDICAL HISTORY :  She  has a past medical history of Anginal pain (HCC) (2016); Dysrhythmia; Asthma; GERD (gastroesophageal reflux disease); Arthritis; Obesity; and Difficult intravenous access (03/27/2015).  PAST SURGICAL HISTORY: She  has past surgical history that includes Ectopic pregnancy surgery (1998); Carpal tunnel release (Bilateral, 2004); Cholecystectomy (2014); Diagnostic laparoscopy (2014); Repair knee ligament (Right, 2014); Tonsillectomy; Tonsillectomy and adenoidectomy (1998); Fracture surgery; Wisdom tooth extraction; and Colonoscopy.  Allergies  Allergen Reactions  . Propoxyphene Other (See Comments)    No current facility-administered medications on file prior to encounter.   No current outpatient prescriptions on file prior to encounter.    FAMILY HISTORY:  Her has no family status information on file.   SOCIAL HISTORY: She  reports that she has never smoked. She does not have any smokeless tobacco history on file. She reports that she does not drink alcohol or use illicit drugs.  REVIEW OF SYSTEMS:   Unable to obtain as pt is intubated.  SUBJECTIVE:   VITAL SIGNS: BP 128/70 mmHg  Pulse 78  Temp(Src) 97.4 F (36.3 C) (Axillary)  Resp 18  Ht  (1.651 m)  Wt 296 lb  (134.265 kg)  BMI 49.26 kg/m2  SpO2 100%  LMP 03/27/2015  HEMODYNAMICS:    VENTILATOR SETTINGS: Vent Mode:  [-] PRVC FiO2 (%):  [40 %] 40 % Set Rate:  [10 bmp] 10 bmp Vt Set:  [760 mL] 760 mL PEEP:  [5 cmH20] 5 cmH20  INTAKE / OUTPUT:    PHYSICAL EXAMINATION: General:  Obese, ETT in place Neuro:  PERRL,  HEENT:  No thyromegaly, JVD. Cardiovascular:  RRR, No MRG Lungs:  Clear, no wheeze or crackles Abdomen:  Soft, + BS, distended. No guarding, rigidity Musculoskeletal:  No edema Skin:  Intact  LABS:  BMET  Recent Labs Lab 03/27/15 1142 03/27/15 1203 03/27/15 1210  NA 138 138 136  K 3.1* 2.9* 2.8*  CL  --   --  108  CO2  --   --  18*  BUN  --   --  11  CREATININE  --   --  0.94  GLUCOSE  --   --  273*    Electrolytes  Recent Labs Lab 03/27/15 1210  CALCIUM 8.8*    CBC  Recent Labs Lab 03/27/15 1142 03/27/15 1203 03/27/15 1210  WBC  --   --  7.9  HGB 8.2* 7.8* 7.4*  HCT 24.0* 23.0* 22.6*  PLT  --   --  259    Coag's  Recent Labs Lab 03/27/15 1210  APTT 28  INR 1.48    Sepsis Markers  Recent Labs Lab 03/27/15 1210  LATICACIDVEN 3.9*    ABG  Recent Labs Lab 03/27/15 1142 03/27/15 1203  PHART 7.252* 7.344*  PCO2ART 48.4* 39.0  PO2ART 576.0* 555.0*    Liver Enzymes  Recent Labs Lab  03/27/15 1210  AST 17  ALT 11*  ALKPHOS 40  BILITOT 0.6  ALBUMIN 1.8*    Cardiac Enzymes  Recent Labs Lab 03/27/15 1210  TROPONINI 0.11*    Glucose No results for input(s): GLUCAP in the last 168 hours.  Imaging Ct Abdomen Pelvis Wo Contrast  03/27/2015  CLINICAL DATA:  45 year old who became acutely hypotensive intraoperatively during L5-S1 posterior decompression and discectomy earlier today. EXAM: CT ABDOMEN AND PELVIS WITHOUT CONTRAST TECHNIQUE: Multidetector CT imaging of the abdomen and pelvis was performed following the standard protocol without IV contrast. COMPARISON:  None. FINDINGS: Lower chest: Blurred by  respiratory motion. Mild dependent atelectasis posteriorly in the lower lobes. Visualized lung bases otherwise clear. Normal heart size. Hepatobiliary: Beam hardening streak artifact as the patient was unable to raise the arms. Allowing for this, normal unenhanced appearance of the liver. Gallbladder surgically absent. No unexpected biliary ductal dilation. Pancreas: Normal unenhanced appearance. Spleen: Normal unenhanced appearance. Adrenals/Urinary Tract: Normal appearing adrenal glands. Unenhanced appearance of the right kidney. Acute perinephric and subcapsular hematoma involving the left kidney. No visible focal parenchymal abnormality involving the left kidney, allowing for the unenhanced technique. Urinary bladder decompressed by Foley catheter. Stomach/Bowel: Stomach normal in appearance for the degree of distention. Normal-appearing small bowel. Normal-appearing colon with expected stool burden. Normal appendix in the right upper pelvis. Vascular/Lymphatic: No visible aortoiliofemoral atherosclerosis. No pathologic lymphadenopathy. Reproductive: Normal-appearing uterus and ovaries without evidence of adnexal mass. Other: Large retroperitoneal hematoma involving predominantly the left side of the abdomen and pelvis, extending from the level of the upper pole of the left kidney inferiorly to the low pelvis, maximum measurements approximating 7.4 x 9.9 x 17.0 cm. The largest portion of the hematoma is anterior to the left psoas muscle. There is a smaller component of the retroperitoneal hematoma in the right side of the abdomen. The retroperitoneal hematoma displaces the left kidney anteriorly. A small amount of intraperitoneal hemorrhage is present in the paracolic gutters, left greater than right. Musculoskeletal: Postsurgical changes related to L5-S1 discectomy and posterior decompression at L5. Gas bubbles in the surgical tract, the epidural space and the L5-S1 disc. No abnormal fluid collection to suggest  abscess or leak. IMPRESSION: 1. Large retroperitoneal hematoma involving predominantly the left side of the abdomen and pelvis, maximum measurements given above. 2. Subcapsular left renal hematoma and left perinephric hematoma, contiguous with the retroperitoneal hematoma. 3. Small amount of intraperitoneal hemorrhage in the paracolic gutters, left greater than right. 4. Expected postsurgical changes at L5-S1. Given the fact that the patient has a subcapsular left renal hematoma and a left perinephric hematoma, the possible etiology of the retroperitoneal hematoma may be due to a lesion in the left kidney. Repeat CT of the abdomen and pelvis with contrast may be helpful once the patient has recovered from surgery to exclude that possibility. These results were discussed directly with Dr. Conchita Paris at the time of interpretation 03/27/2015 at 1 o'clock p.m. Electronically Signed   By: Hulan Saas M.D.   On: 03/27/2015 13:27   Dg C-arm 1-60 Min-no Report  03/27/2015  CLINICAL DATA: plif 5-s1 C-ARM 1-60 MINUTES Fluoroscopy was utilized by the requesting physician.  No radiographic interpretation.    STUDIES:  CT abd 2/17 >> Large RP hematoma with renal, perinephric hematoma Intra op TEE 2/17 >> Normal cardiac function by verbal report.   CULTURES:  ANTIBIOTICS: Cefazolin 2/17 >>  SIGNIFICANT EVENTS: 2/17 - Admit for L5 laminectomy >> hemorrhagic shock from RP bleed.   LINES/TUBES: Rt  IJ 2/17 >>  DISCUSSION: 45 y/o with hemorraghic shock from large RP hematoma that occurred during operation for L5 laminectomy.  ASSESSMENT / PLAN:  PULMONARY A: Intubated P:   Continue full vent support for now. Check ABG and vent adjustments as per  CARDIOVASCULAR A:  Hemorrhagic shock Elevated LA P:  Transfuse 2 more units PRBCs. She got 2 units upto now.  Follow Hct. Plan for abd exploration by vascular surgery.  RENAL A:   Stable Risk for renal failure with renal, perinephric  hematoma P:   Monitor renal function and urine output.   GASTROINTESTINAL A:   No issues P:   Keep NPO  HEMATOLOGIC A:   Low Hb from acute bleed P:  Transfuse 2 units  Repeat CBC, coags  INFECTIOUS A:   No acute infection P:   On cefazolin for op prophylaxis  ENDOCRINE A:   No issues P:   SSI Check cortisol  NEUROLOGIC A:   Sedation for intubation P:   RASS goal: 0 Fentanyl drip Versed PRN  FAMILY  - Updates: Family updated at bedside - Inter-disciplinary family meet or Palliative Care meeting due by:  2/24.  My CC time- 45 mins  Chilton Greathouse MD  Pulmonary and Critical Care Pager [phone] If no answer or after 3pm call: [phone] 03/27/2015, 2:05 PM

## 2015-03-27 NOTE — Op Note (Signed)
PREOP DIAGNOSIS:  1. Spondylolisthesis, L5-S1  POSTOP DIAGNOSIS: Same  PROCEDURE: 1. L5 laminectomy with facetectomy for decompression of exiting nerve roots, more than would be required for placement of interbody graft  Remainder of planned procedure was aborted due to hemodynamic instability  SURGEON: Dr. Lisbeth Renshaw, MD  ASSISTANT: Dr. Barnett Abu, MD  ANESTHESIA: General Endotracheal  EBL: 300cc  SPECIMENS: None  DRAINS: None  COMPLICATIONS: Acute hemodynamic instability requiring emergent closure and return to supine position  CONDITION: On pressors to ICU  HISTORY: Kristina Estes is a 45 y.o. 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 She attempted multiple conservative treatments and ultimately elected to proceed with surgical decompression and fusion. Risks and benefits were reviewed and consent was obtained.  PROCEDURE IN DETAIL: After informed consent was obtained and witnessed, the patient was brought to the operating room. After induction of general anesthesia, the patient was positioned on the operative table in the prone position. All pressure points were meticulously padded. Incision was then marked out and prepped and draped in the usual sterile fashion.  After timeout was conducted, skin was infiltrated with local anesthetic. Skin incision was then made sharply and Bovie electrocautery was used to dissect the subcutaneous tissue until the lumbodorsal fascia was identified and incised. The muscle was then elevated in the subperiosteal plane and the L5 and S1 lamina, inferior L4-5 and superior L5-S1 facet complexes were identified. Self-retaining retractors were then placed.  Using intraoperative fluoroscopy, entry points for bilateral L5 cortical pedicle screws were identified. Pilot holes were then created under lateral fluoroscopy and tapped to 5.5 mm.  At this point attention  was turned to decompression. Complete L5 laminectomy with facetectomy was completed using a combination of Kerrison rongeurs and a high-speed drill. Good decompression of the exiting and traversing roots was confirmed.  Disc space was then identified, incised, and using a combination of shavers, curettes and rongeurs, complete discectomy was completed. During discectomy while working on the left side, I was informed by anesthesia that the patient's blood pressure acutely dropped with drop in heart rate, and decrease in EtCO2. The field was flooded. The patient required pressors to maintain SBP. Given the acute nature of the change, the decision was made to abort the surgery and close the wound emergently.   Facial stitches were placed, the subcutaneous layer was closed, and the wound stapled. Bacitracin ointment, sterile gauze, and ioban was used to dress the wound.  At the end of the case all sponge, needle, cottonoid, and instrument counts were correct.  The patient was then turned into the supine position. TEE was done by the anesthesiologist which did not demonstrate any obvious cardiac dysfunction. 12-lead EKG demonstrated sinus tachycardia. Abdominal ultrasound was also done which preliminarily did not show any abdominal free fluid, although retroperitoneal hematoma cannot be ruled out.  Once the patient was stable she was transferred to the ICU.

## 2015-03-27 NOTE — Anesthesia Procedure Notes (Addendum)
Central Venous Catheter Insertion Performed by: anesthesiologist Patient location: Pre-op. Preanesthetic checklist: patient identified, IV checked, site marked, risks and benefits discussed, surgical consent, monitors and equipment checked, pre-op evaluation, timeout performed and anesthesia consent Lidocaine 1% used for infiltration Landmarks identified Catheter size: 8 Fr Central line was placed.Double lumen Procedure performed using ultrasound guided technique. Attempts: 1 Following insertion, dressing applied and line sutured. Post procedure assessment: blood return through all ports. Patient tolerated the procedure well with no immediate complications.   Procedure Name: Intubation Date/Time: 03/27/2015 7:44 AM Performed by: Charm Barges, Shifra Swartzentruber R Pre-anesthesia Checklist: Patient identified, Emergency Drugs available, Suction available, Patient being monitored and Timeout performed Patient Re-evaluated:Patient Re-evaluated prior to inductionOxygen Delivery Method: Circle system utilized Preoxygenation: Pre-oxygenation with 100% oxygen Intubation Type: IV induction Ventilation: Mask ventilation without difficulty Laryngoscope Size: Mac and 3 Grade View: Grade I Tube type: Oral Number of attempts: 1 Airway Equipment and Method: Stylet Placement Confirmation: ETT inserted through vocal cords under direct vision,  positive ETCO2 and breath sounds checked- equal and bilateral Secured at: 22 cm Tube secured with: Tape Dental Injury: Teeth and Oropharynx as per pre-operative assessment

## 2015-03-27 NOTE — Anesthesia Postprocedure Evaluation (Signed)
Anesthesia Post Note  Patient: [name]  Procedure(s) Performed: Procedure(s) (LRB): Repair Left Common Iliac Vein, Ligation of Left Iliac Artery, Left Common Ileo-Femoral Bypass Graft (Left) REPAIR ILIAC ARTERY with graft from Left Common Iliac to Left Femoral Artery (Left)  Patient location during evaluation: SICU Anesthesia Type: General Level of consciousness: sedated Pain management: pain level controlled Vital Signs Assessment: post-procedure vital signs reviewed and stable Respiratory status: patient remains intubated per anesthesia plan Cardiovascular status: stable Anesthetic complications: no    Last Vitals:  Filed Vitals:   03/27/15 1400 03/27/15 1415  BP: 106/67   Pulse: 106 106  Temp:    Resp: 15 28    Last Pain:  Filed Vitals:   03/27/15 2059  PainSc: 4                  Ariana Cavenaugh DANIEL

## 2015-03-27 NOTE — Brief Op Note (Deleted)
OPERATIVE NOTE   PROCEDURE: 1. Exploratory laparotomy 2. Repair of left iliac vein (dictated by Dr. Arbie Cookey) 3. Ligation of distal left common iliac artery (dictated by Dr. Arbie Cookey) 4. Left common iliac artery to left common femoral artery bypass with 8 mm Dacron 5. Placement of abdominal negative pressure dressing  PRE-OPERATIVE DIAGNOSIS: retroperitoneal hematoma after spine procedure, hemorrhagic shock  POST-OPERATIVE DIAGNOSIS: same  CO-SURGEONS: Leonides Sake, MD; Gretta Began, MD  ASSISTANT(S): Lianne Cure, PAC; Doreatha Massed, [facility]   ANESTHESIA: general  ESTIMATED BLOOD LOSS: 9 L  FLUID: 14 L  BLOOD PRODUCTS:  PRBC: 10 units  BRAT: 3200 cc  FFP: 16 units  Cryo: 8 units  PLT: 3 units  UOP: 500 cc  FINDING(S): 1.  Left common iliac artery injury with compromised 2/3 of circumference 2.  Left common iliac vein spiral tear 3.  Sigmoid colon diverticulitis 4.  Scattered adhesions to bowel and omentum 5.  Stool throughout colon 6.  Adhesions of the liver to the transverse colon 7.  Bowel edema throughout preventing abdominal closure 8.  Dopplerable left posterior tibial artery and anterior tibial artery   SPECIMEN(S):  none  INDICATIONS:   Kristina Estes is a 45 y.o. female who presents with hemorrhagic shock after an aborted spinal procedure.  The patient's CT abd/pelvis was consistent with likely retroperitoneal injury, so I recommended exploration and repair to the patient's family.  The patient is aware the risks of aortic surgery include but are not limited to: bleeding, need for transfusion, infection, death, stroke, paralysis, wound complications, bowel injuries, impotence, bowel ischemia, extended ventilation and future ventral hernias.     DESCRIPTION: After obtaining full informed written consent, the patient was brought back to the operating room and placed supine upon the operating table.  The patient received IV antibiotics prior to induction.   After obtaining adequate anesthesia, the patient was prepped and draped in the standard fashion for: aortic exploration.  A two-surgeon technique was utilized due to the complexity of this case and inability of less experienced personnel to maintain exposure in this morbidly obese patient.  I could not easily feel this patient's sternum or pubic bone.  Based on visual inspection, I determine what I thought was the mid-line.  I made an incision extending from the sternum down to the pubic bone.  I opened the subcutaneous tissue with electrocautery.  This turned out to be challenge due to 8 cm of fat in the anterior wall.  Eventually, I got down to fascia.  Finding the mid-line turned out to be a bit changing, and I initially was going to proceed with a paramedian opening in the rectus fascia due to the difficulty finding the mid-line.  Eventually, I did find the mid-line and opened the fascia under direct visualization.  Immediately upon entering the peritoneum, there was some free floating blood noted.  It was readily apparent due to her morbid obesity exposure was going to be difficult.  At this point, I put in the Bookwalter retractor to gain more exposure  I started to eviscerate her bowels to facilitate exploration of her abdomen, but her omentum appeared to be stuck, so some blunt dissection with electrocautery was necessary to free up the omentum.  I identified the ligament of Treitz and started to run the bowels when I found some adhesions in the distal ileum that required sharp dissection.  I eventually got this portion of the bowel free.  In running the colon, I found the transverse colon  to be adherent to the liver.  I did not visualize the gallbladder as I did not take down these adhesions.  The stomach appeared non-diseased with nasogastric tube in appropriate position.  Distally, there was extensive moderate sized diverticulae in the sigmoid colon.   As this point, I placed a Omni-tract retractor  system.   I eviscerated the transverse colon into a wet towel and then eviscerated the small intestines into a wet towel.  Retractor blades were applied to maintain exposure in this abdomen.  I could feel the aortic pulsation in the retroperitoneum.  There was obvious thrombus in the retroperitoneum.  Using electrocautery and blunt dissection, I dissected out the aorta from 3-4 cm distal to the renal arteries down to the aortic bifurcation.  I got control of both common iliac artery with vessel loops.  I slowly carried my dissection distally on the right common iliac artery and vein.  While there was some thrombus, I did not see an clear injury of either vessel.  I carried this process down the left common iliac artery and vein.  There appeared to be more thrombus on this side.  I had to extend my dissection distally, but it began to enter into the sigmoid mesentery, so my exposure was limited by this patient's anatomy and extreme obesity.  With additional retraction, we found a segment of the left common iliac artery and vein that was intensely bleeding.  I had to apply pressure with sponge sticks to maintain some control.  The patient was given 15000 units of Heparin intravenously, which was a therapeutic bolus. n additional 2000 units of Heparin was administered every hour after initial bolus to maintain anticoagulation.  In total, 78295 units of Heparin was administrated to achieve and maintain a therapeutic level of anticoagulation.  Dr. Arbie Cookey clamped the left proximal common iliac artery.  With great difficulty, I was able to eventually visualize and clamp the distal left common iliac artery.  There was essentially a defect in the left common iliac artery spanning 2/3 of the circumference of this segment.  Due to the difficulties with more distal dissection given this patient's obesity and the sigmoid mesentery, I did not feel, I could easily sew a jump graft from the left common iliac artery to the distal  common iliac artery.  At this point, Dr. Marlise Eves the distal left common iliac artery (see his notes for details).  It became apparent also that there was a left common iliac vein injury with a spiral tear.  Dr. Arbie Cookey repaired this with a running stitch of 5-0 Prolene while I held pressure (see his notes for details).  At this point, we had control of the previously torrential bleeding.  I then identified the left common femoral artery under sonosite guidance.  I made an incision in left groin overlying the artery and dissected down the left common femoral artery with electrocautery.  I dissected out the femoral bifurcation and superficial femoral artery and profunda femoral artery.  I dissected from the right groin, immediately on top of the external iliac artery until I reached the transected left common iliac artery with a sponge stick.  I pulled an umbilical tape through the iliac tunnel.  I tied one end of the graft to the umbilical tape and pulled a 8 mm Dacron graft through the left iliac tunnel.  I spatulated the left proximal common iliac artery, cleaning up the edges in the process.  I then spatulated the graft.  I sewed  the graft to the proximal left common iliac artery with a running stitch of 5-0 Prolene, in an end-to-end configuration.  I then pulled the graft to appropriate tension and length.  I clamped the left proximal common femoral artery and put the superficial femoral artery and profunda femoral artery under tension.  I made an arteriotomy and then extended it proximally.  I spatulated the graft to the dimensions of the arteriotmy.  The graft was sewn to the artery in an end-to-side configuration with a running stitch of 5-0 Prolene.  Prior to completion of this, all vessels were backbled: reasonable bleeding from superficial femoral artery and profunda femoral artery and vigorous bleeding from left proximal common femoral artery.  I also allowed the graft to bleed.  There was  excellent pulsatile bleeding.    At this point, I started reversing the anticoagulation.  I gave 80 mg of Protamine in total.  Anesthesia was also aggressively reversing coagulopathies felt to be due to her intense bleeding with blood products.  I packed the left groin with Avitene, as did I pack the retroperitoneum.  The patient continued to decompress the left retroperitoneum hematoma via a rent in the retroperitoneum in the left perirenal area.  I briefly removed retractors to check on the bowel.  Significant edema was noted, as expected given the massive resuscitation.  I did not feel her fascia could be closed.  As I was planning on placing an abdominal wound VAC, I elected to leave the retroperitoneum open to continue decompressing the prior hematoma.  I will plan on closing the retroperitoneum at the first abdominal washout.    Eventually bleeding in both the abdomen and left groin slowed down.  The left groin was essentially dry.  I repaired this groin with a double layer of 2-0 Vicryl and a double layer of 3-0 Vicryl.  The skin was closed with staples.  I obtained a small VAC sponge and fashioned it for this incision.  I affixed the sponge with adhesive dressings and cut a hole in the central and affixed the lilypad.  The lilypad was connected to the VAC pump and the dressing became adherent at 125 continuous.  At this point, I turned my attention to the abdomen.  There no active bleeding but the patient did continue drainage from the retroperitoneal rent.  I ran the bowel from ligament of Treitz to sigmoid colon.  One area of ileum required a few Limbert stitches of 3-0 silk related to the lysis of adhesion.  No mucosa was evident in this segment.   I then fashioned the internal drape for the abdominal VAC setup.  This applied on top of the bowels and omentum.  I the placed two layer of sponges overlying the internal drape.  I stapled the external sponge in place.  The external sponge was affixed with  adhesive panels.  An incision was made in the central drape and the lilypad affixed.  The lilypad was connected to a VAC pump and activated at 125 continuous.  The abd VAC sponge became adherent.    Distally at the end of this case, there were doppler posterior tibial artery and anterior tibial artery signals.  The left calf was soft.   COMPLICATIONS: none  CONDITION: critical   Leonides Sake, MD [facility] Office: [phone] Pager: [phone]  03/27/2015, 8:33 PM

## 2015-03-27 NOTE — Op Note (Signed)
    OPERATIVE REPORT  DATE OF SURGERY: 03/27/2015  PATIENT: Kristina Estes, 45 y.o. female MRN: [id number]  DOB: 1971-01-20  PRE-OPERATIVE DIAGNOSIS: Her abdominal bleeding following L5-S1 disc surgery  POST-OPERATIVE DIAGNOSIS:  Same  PROCEDURE: Exploratory laparotomy control of bleeding and left common iliac to left common femoral bypass with 8 mm Hemashield graft.  CO_SURGEON:  Dr. Leonides Sake, Gretta Began, M.D.  PHYSICIAN ASSISTANT: Sherrie Mustache PA-C  ANESTHESIA:  Gen.    Total I/O In: 25565.3 [I.V.:15847.3; Blood:9718] Out: 57846 [Urine:850; Blood:9150]     PLAN OF CARE: Call care   PATIENT DISPOSITION:  PACU - hemodynamically stable  PROCEDURE DETAILS: Kilos regarding the expiratory laparotomy and the bypass will be dictated by Dr. Imogene Burn. Of after the iliac vessels were exposed and going down further towards the pelvis there was obvious arterial and venous bleeding from the left iliac artery and vein. The control was initially obtained after heparin was given of the common iliac artery on the left. Dissection down in the pelvis was continued and eventually the distal common duct artery was also controlled. Patient was morbidly obese dissection or pelvis was extremely difficult. There was a near transection of the iliac artery. There was clearly no way of closing this primarily. There is also no way of placing interposition graft done in her pelvis. For this reason the distal common iliac artery was oversewn with a 401 5-0 Prolene. Extensive bleeding from the iliac vein was controlled with a sponge stick. After the artery was controlled the vein was exposed by using sponge stick for proximal distal control. There was an area over the lateral aspect to the left of the left iliac vein and a tear below this as well. This was controlled with multiple 301 4-0 Prolene sutures. This did narrow the iliac vein. After this had been controlled revascularization of the left leg was undertaken.  This will be dictated as a separate note with Dr. Tenna Child, M.D. 03/27/2015 6:59 PM

## 2015-03-27 NOTE — Progress Notes (Addendum)
   Daily Progress Note  Assessment/Planning: POD #0 s/p Xlap, Repair of left common iliac vein, Ligation of distal left CIA, Left CIA to CFA bypass, Abd VAC   Hemodynamically stable  Coagulopathy mostly reversed  H/H acceptable  Subjective  - Day of Surgery  No events since transfer to ICU  Objective Filed Vitals:   03/27/15 2145 03/27/15 2200 03/27/15 2215 03/27/15 2230  BP: 135/94 144/98 149/97 145/95  Pulse: 83 83 80 76  Temp:      TempSrc:      Resp: Height:      Weight:      SpO2: 100% 100% 100% 100%    Intake/Output Summary (Last 24 hours) at 03/27/15 2328 Last data filed at 03/27/15 2200  Gross per 24 hour  Intake 31815.29 ml  Output  40981 ml  Net 20950.29 ml    PULM  Intubated and sedated on vent Vent Mode:  [-] PRVC FiO2 (%):  [40 %-60 %] 60 % Set Rate:  [10 bmp-14 bmp] 14 bmp Vt Set:  [450 mL-760 mL] 450 mL PEEP:  [5 cmH20] 5 cmH20 Plateau Pressure:  [27 cmH20] 27 cmH20 CV  RR, tachy GI  abd VAC in place and adherent VASC  L foot viable, soft calf  Laboratory CBC    Component Value Date/Time   WBC 8.3 03/27/2015 2205   HGB 9.2* 03/27/2015 2205   HCT 27.0* 03/27/2015 2205   PLT 111* 03/27/2015 2206    BMET    Component Value Date/Time   NA 144 03/27/2015 2205   K 4.2 03/27/2015 2205   CL 116* 03/27/2015 2205   CO2 18* 03/27/2015 2205   GLUCOSE 177* 03/27/2015 2205   BUN 11 03/27/2015 2205   CREATININE 0.77 03/27/2015 2205   CALCIUM 7.5* 03/27/2015 2205   GFRNONAA >60 03/27/2015 2205   GFRAA >60 03/27/2015 2205   PTT 37 INR 1.32 Fibrinogen 455  Leonides Sake, MD [facility] [place] Office: [phone] Pager: [phone]  03/27/2015, 11:28 PM

## 2015-03-27 NOTE — Progress Notes (Addendum)
CRITICAL VALUE ALERT  Critical value received:  Lactic acid 3.9  Date of notification:03/27/15  Time of notification: 1325  Critical value read back:Yes.    Nurse who received alert:  Verlin Dike  MD notified (1st page):  Dr. Conchita Paris and Dr. Caren Griffins  Time of first page:  1325  MD notified (2nd page):  Time of second page:  Responding MD:  Dr. Conchita Paris and Dr. Caren Griffins  Time MD responded: [phone]

## 2015-03-28 ENCOUNTER — Inpatient Hospital Stay (HOSPITAL_COMMUNITY): Payer: PRIVATE HEALTH INSURANCE

## 2015-03-28 DIAGNOSIS — J9601 Acute respiratory failure with hypoxia: Secondary | ICD-10-CM

## 2015-03-28 LAB — PREPARE FRESH FROZEN PLASMA
UNIT DIVISION: 0
UNIT DIVISION: 0
UNIT DIVISION: 0
UNIT DIVISION: 0
UNIT DIVISION: 0
UNIT DIVISION: 0
UNIT DIVISION: 0
UNIT DIVISION: 0
UNIT DIVISION: 0
Unit division: 0
Unit division: 0
Unit division: 0
Unit division: 0
Unit division: 0
Unit division: 0
Unit division: 0

## 2015-03-28 LAB — PREPARE CRYOPRECIPITATE
UNIT DIVISION: 0
UNIT DIVISION: 0
UNIT DIVISION: 0
UNIT DIVISION: 0
Unit division: 0
Unit division: 0
Unit division: 0
Unit division: 0

## 2015-03-28 LAB — POCT I-STAT 3, ART BLOOD GAS (G3+)
Acid-base deficit: 4 mmol/L — ABNORMAL HIGH (ref 0.0–2.0)
Bicarbonate: 21.7 mEq/L (ref 20.0–24.0)
O2 SAT: 99 %
PCO2 ART: 41.4 mmHg (ref 35.0–45.0)
PO2 ART: 149 mmHg — AB (ref 80.0–100.0)
Patient temperature: 97.8
TCO2: 23 mmol/L (ref 0–100)
pH, Arterial: 7.325 — ABNORMAL LOW (ref 7.350–7.450)

## 2015-03-28 LAB — PREPARE PLATELET PHERESIS
UNIT DIVISION: 0
UNIT DIVISION: 0
UNIT DIVISION: 0
Unit division: 0
Unit division: 0

## 2015-03-28 LAB — CBC
HCT: 26.7 % — ABNORMAL LOW (ref 36.0–46.0)
Hemoglobin: 9.1 g/dL — ABNORMAL LOW (ref 12.0–15.0)
MCH: 28.6 pg (ref 26.0–34.0)
MCHC: 34.1 g/dL (ref 30.0–36.0)
MCV: 84 fL (ref 78.0–100.0)
PLATELETS: 112 10*3/uL — AB (ref 150–400)
RBC: 3.18 MIL/uL — ABNORMAL LOW (ref 3.87–5.11)
RDW: 14.3 % (ref 11.5–15.5)
WBC: 7.5 10*3/uL (ref 4.0–10.5)

## 2015-03-28 LAB — COMPREHENSIVE METABOLIC PANEL
ALBUMIN: 2.9 g/dL — AB (ref 3.5–5.0)
ALT: 20 U/L (ref 14–54)
ANION GAP: 9 (ref 5–15)
AST: 39 U/L (ref 15–41)
Alkaline Phosphatase: 44 U/L (ref 38–126)
BILIRUBIN TOTAL: 1.5 mg/dL — AB (ref 0.3–1.2)
BUN: 10 mg/dL (ref 6–20)
CHLORIDE: 113 mmol/L — AB (ref 101–111)
CO2: 22 mmol/L (ref 22–32)
Calcium: 7.7 mg/dL — ABNORMAL LOW (ref 8.9–10.3)
Creatinine, Ser: 0.91 mg/dL (ref 0.44–1.00)
GFR calc Af Amer: >60 mL/min (ref >60)
GFR calc non Af Amer: >60 mL/min (ref >60)
GLUCOSE: 114 mg/dL — AB (ref 65–99)
POTASSIUM: 3.6 mmol/L (ref 3.5–5.1)
SODIUM: 144 mmol/L (ref 135–145)
TOTAL PROTEIN: 5.3 g/dL — AB (ref 6.5–8.1)

## 2015-03-28 LAB — MAGNESIUM: Magnesium: 1.6 mg/dL — ABNORMAL LOW (ref 1.7–2.4)

## 2015-03-28 LAB — DIC (DISSEMINATED INTRAVASCULAR COAGULATION)PANEL
D-Dimer, Quant: >20 ug/mL-FEU — ABNORMAL HIGH (ref 0.00–0.50)
INR: 1.23 (ref 0.00–1.49)
Platelets: 119 10*3/uL — ABNORMAL LOW (ref 150–400)
Prothrombin Time: 15.6 seconds — ABNORMAL HIGH (ref 11.6–15.2)

## 2015-03-28 LAB — MRSA PCR SCREENING: MRSA by PCR: INVALID — AB

## 2015-03-28 LAB — CK
Total CK: 374 U/L — ABNORMAL HIGH (ref 38–234)
Total CK: 359 U/L — ABNORMAL HIGH (ref 38–234)

## 2015-03-28 LAB — DIC (DISSEMINATED INTRAVASCULAR COAGULATION) PANEL
APTT: 30 s (ref 24–37)
FIBRINOGEN: 468 mg/dL (ref 204–475)
SMEAR REVIEW: NONE SEEN

## 2015-03-28 MED ORDER — MAGNESIUM SULFATE 2 GM/50ML IV SOLN
2.0000 g | Freq: Once | INTRAVENOUS | Status: AC
  Administered 2015-03-28: 2 g via INTRAVENOUS
  Filled 2015-03-28: qty 50

## 2015-03-28 MED ORDER — POTASSIUM CHLORIDE 20 MEQ/15ML (10%) PO SOLN
20.0000 meq | Freq: Every day | ORAL | Status: DC
  Administered 2015-03-28: 20 meq via ORAL
  Filled 2015-03-28: qty 15

## 2015-03-28 NOTE — Progress Notes (Signed)
   Daily Progress Note  LLE: continued good pulses in L foot, L calf remains soft but swollen, CK only 374.  - continue serial exams on L calf  Leonides Sake, MD [facility] [place] Office: [phone] Pager: [phone]  03/28/2015, 11:43 AM

## 2015-03-28 NOTE — Consult Note (Signed)
PULMONARY / CRITICAL CARE MEDICINE   Name: Braniya Farrugia MRN: [id number] DOB: 08-May-1970    ADMISSION DATE:  03/27/2015 CONSULTATION DATE:  03/27/15  REFERRING MD:  Sharion Dove  CHIEF COMPLAINT:  Hemorrhagic shock  HISTORY OF PRESENT ILLNESS:   Mrs. Heimann is a 45 year old with history of asthma, arthritis, obesity. She had an elective admission today for L5 laminectomy. Intra-Op she went into shock, hemodynamic instability. Subsequent CT showed large left retroperitoneal hematoma. She was put on 2 pressors (norepi, epinephrine) and transferred to ICU. PCCM consulted for help with management. Vascular surgery was also consulted, underwent repair of L CIV with bypass to CFA.  Intra OP TEE showed normal cardiac function.  SUBJECTIVE:  Sedated and intubated  VITAL SIGNS: BP 127/79 mmHg  Pulse 82  Temp(Src) 98 F (36.7 C) (Oral)  Resp 20  Ht 5\' 3"  (1.6 m)  Wt 150.8 kg (332 lb 7.3 oz)  BMI 58.91 kg/m2  SpO2 100%  LMP 03/27/2015  HEMODYNAMICS:    VENTILATOR SETTINGS: Vent Mode:  [-] PRVC FiO2 (%):  [40 %-60 %] 50 % Set Rate:  [10 bmp-20 bmp] 20 bmp Vt Set:  [450 mL-760 mL] 450 mL PEEP:  [5 cmH20] 5 cmH20 Plateau Pressure:  [24 cmH20-30 cmH20] 30 cmH20  INTAKE / OUTPUT: I/O last 3 completed shifts: In: 33332.3 [I.V.:19734.3; Blood:13218; NG/GT:30; IV Piggyback:350] Out: 65784 [Urine:1865; Drains:1350; Blood:9150]  PHYSICAL EXAMINATION: General:  Obese, ETT in place Neuro:  Opens eyes to voice, nodded to questions.  HEENT:  No thyromegaly, JVD. Cardiovascular:  RRR, No MRG Lungs:  Clear, no wheeze or crackles Abdomen:  Soft, + BS, distended. No guarding, rigidity Musculoskeletal:  No edema Skin:  Intact  LABS:  BMET  Recent Labs Lab 03/27/15 1210 03/27/15 2205 03/28/15 0547  NA 136 144 144  K 2.8* 4.2 3.6  CL 108 116* 113*  CO2 18* 18* 22  BUN 11 11 10   CREATININE 0.94 0.77 0.91  GLUCOSE 273* 177* 114*    Electrolytes  Recent Labs Lab 03/27/15 1210  03/27/15 2205 03/28/15 0547  CALCIUM 8.8* 7.5* 7.7*  MG  --  1.2*  --     CBC  Recent Labs Lab 03/27/15 1928 03/27/15 2205 03/27/15 2206 03/28/15 0546 03/28/15 0547  WBC 9.5 8.3  --  7.5  --   HGB 9.0* 9.2*  --  9.1*  --   HCT 26.5* 27.0*  --  26.7*  --   PLT 97*  94* 108* 111* 112* 119*    Coag's  Recent Labs Lab 03/27/15 2205 03/27/15 2206 03/28/15 0547  APTT 37 36 30  INR 1.32 1.31 1.23    Sepsis Markers  Recent Labs Lab 03/27/15 1210  LATICACIDVEN 3.9*    ABG  Recent Labs Lab 03/27/15 2155 03/27/15 2200 03/28/15 0743  PHART 7.288* 7.409 7.325*  PCO2ART 43.9 53.8* 41.4  PO2ART 72.0* 60.3* 149.0*    Liver Enzymes  Recent Labs Lab 03/27/15 1210 03/27/15 2205 03/28/15 0547  AST 17 32 39  ALT 11* 20 20  ALKPHOS 40 41 44  BILITOT 0.6 2.7* 1.5*  ALBUMIN 1.8* 3.0* 2.9*    Cardiac Enzymes  Recent Labs Lab 03/27/15 1210  TROPONINI 0.11*    Glucose  Recent Labs Lab 03/27/15 2117  GLUCAP 162*    Imaging Ct Abdomen Pelvis Wo Contrast  03/27/2015  CLINICAL DATA:  45 year old who became acutely hypotensive intraoperatively during L5-S1 posterior decompression and discectomy earlier today. EXAM: CT ABDOMEN AND PELVIS WITHOUT CONTRAST TECHNIQUE: Multidetector  CT imaging of the abdomen and pelvis was performed following the standard protocol without IV contrast. COMPARISON:  None. FINDINGS: Lower chest: Blurred by respiratory motion. Mild dependent atelectasis posteriorly in the lower lobes. Visualized lung bases otherwise clear. Normal heart size. Hepatobiliary: Beam hardening streak artifact as the patient was unable to raise the arms. Allowing for this, normal unenhanced appearance of the liver. Gallbladder surgically absent. No unexpected biliary ductal dilation. Pancreas: Normal unenhanced appearance. Spleen: Normal unenhanced appearance. Adrenals/Urinary Tract: Normal appearing adrenal glands. Unenhanced appearance of the right kidney.  Acute perinephric and subcapsular hematoma involving the left kidney. No visible focal parenchymal abnormality involving the left kidney, allowing for the unenhanced technique. Urinary bladder decompressed by Foley catheter. Stomach/Bowel: Stomach normal in appearance for the degree of distention. Normal-appearing small bowel. Normal-appearing colon with expected stool burden. Normal appendix in the right upper pelvis. Vascular/Lymphatic: No visible aortoiliofemoral atherosclerosis. No pathologic lymphadenopathy. Reproductive: Normal-appearing uterus and ovaries without evidence of adnexal mass. Other: Large retroperitoneal hematoma involving predominantly the left side of the abdomen and pelvis, extending from the level of the upper pole of the left kidney inferiorly to the low pelvis, maximum measurements approximating 7.4 x 9.9 x 17.0 cm. The largest portion of the hematoma is anterior to the left psoas muscle. There is a smaller component of the retroperitoneal hematoma in the right side of the abdomen. The retroperitoneal hematoma displaces the left kidney anteriorly. A small amount of intraperitoneal hemorrhage is present in the paracolic gutters, left greater than right. Musculoskeletal: Postsurgical changes related to L5-S1 discectomy and posterior decompression at L5. Gas bubbles in the surgical tract, the epidural space and the L5-S1 disc. No abnormal fluid collection to suggest abscess or leak. IMPRESSION: 1. Large retroperitoneal hematoma involving predominantly the left side of the abdomen and pelvis, maximum measurements given above. 2. Subcapsular left renal hematoma and left perinephric hematoma, contiguous with the retroperitoneal hematoma. 3. Small amount of intraperitoneal hemorrhage in the paracolic gutters, left greater than right. 4. Expected postsurgical changes at L5-S1. Given the fact that the patient has a subcapsular left renal hematoma and a left perinephric hematoma, the possible  etiology of the retroperitoneal hematoma may be due to a lesion in the left kidney. Repeat CT of the abdomen and pelvis with contrast may be helpful once the patient has recovered from surgery to exclude that possibility. These results were discussed directly with Dr. Conchita Paris at the time of interpretation 03/27/2015 at 1 o'clock p.m. Electronically Signed   By: Hulan Saas M.D.   On: 03/27/2015 13:27   US Abdomen Limited  03/27/2015  CLINICAL DATA:  Intraoperative exam to assess for abdominal/pelvic fluid/ hematoma. CT scan after this ultrasound reveals large retroperitoneal hematoma left-greater-than-right as well as left perinephric and subscapular hematoma of which Dr.Nundkumar is aware. EXAM: LIMITED ABDOMINAL ULTRASOUND COMPARISON:  CT 03/27/2015 FINDINGS: Limited all intraoperative ultrasound evaluation of the bilateral lower quadrants demonstrates no definite focal free fluid collection. IMPRESSION: No definite focal fluid collection within the lower quadrants. Electronically Signed   By: Elberta Fortis M.D.   On: 03/27/2015 14:13   Portable Chest Xray  03/28/2015  CLINICAL DATA:  Intubation EXAM: PORTABLE CHEST 1 VIEW COMPARISON:  Yesterday FINDINGS: Tubular devices are stable. Cardiomegaly is stable. Hazy opacity throughout the left hemi thorax has increased which may represent hazy airspace disease or layering pleural fluid. Vascularity is unremarkable. IMPRESSION: Increasing AC opacity throughout the left hemi thorax as described representing either hazy airspace disease or layering pleural fluid. Electronically  Signed   By: Jolaine Click M.D.   On: 03/28/2015 08:56   Dg Chest Port 1 View  03/27/2015  CLINICAL DATA:  Open wound in the anterior abdomen.  Intubated. EXAM: PORTABLE CHEST 1 VIEW COMPARISON:  None. FINDINGS: Endotracheal tube tip is 2.3 cm above the carina. Enteric tube enters the stomach, with the tip not seen on this image. Right internal jugular central venous catheter  terminates in the middle third of the superior vena cava. Left internal jugular central venous sheath is noted with the tip overlying the left superior mediastinum, probably within the lower left internal jugular vein or left brachiocephalic vein. Top-normal heart size and normal mediastinal contour accounting for low lung volumes. No pneumothorax. No pleural effusion. Prominence of the interstitial markings probably represents vascular crowding due to low lung volumes. There is left lower lobe opacity. IMPRESSION: 1. Well-positioned support hardware as described. 2. Low lung volumes. Left lower lobe opacity likely represents atelectasis. Electronically Signed   By: Delbert Phenix M.D.   On: 03/27/2015 22:20   Dg Abd Portable 1v  03/27/2015  CLINICAL DATA:  45 year old female status post -abdominal surgery. Incorrect surgical device count EXAM: PORTABLE ABDOMEN - 1 VIEW COMPARISON:  CT dated 03/27/2015 FINDINGS: Evaluation is limited due to soft tissue attenuation by body habitus as well as portable technique. Support lines noted overlying the left upper abdomen. Right upper quadrant cholecystectomy clips seen. Multiple cutaneous surgical clips noted in the lower abdomen to the right of the spine. No other radiopaque foreign object identified. There is no evidence of bowel obstruction. No radiopaque calculi. The osseous structures are grossly unremarkable. IMPRESSION: No definite evidence of radiopaque surgical foreign object These results were called by telephone at the time of interpretation on 03/27/2015 at 8:08 pm to Nurse Julien Girt, who verbally acknowledged these results. Electronically Signed   By: Elgie Collard M.D.   On: 03/27/2015 20:11    STUDIES:  CT abd 2/17 >> Large RP hematoma with renal, perinephric hematoma Intra op TEE 2/17 >> Normal cardiac function by verbal report.   CULTURES:  ANTIBIOTICS: Cefazolin 2/17 >>  SIGNIFICANT EVENTS: 2/17 - Admit for L5 laminectomy >> hemorrhagic shock  from RP bleed.   LINES/TUBES: Rt IJ 2/17 >>  DISCUSSION: 45 y/o with hemorraghic shock from large RP hematoma that occurred during operation for L5 laminectomy, s/p L CIV repair.  ASSESSMENT / PLAN:  PULMONARY A: Intubated P:   Continue full vent support, will assess for WUA and SBT when ok to do so per surgery; ? When her abdomen will be closed.   CARDIOVASCULAR A:  Hemorrhagic shock Elevated LA P:  Follow Hct and for signs bleeding  RENAL A:   Stable Risk for renal failure with renal, perinephric hematoma P:   Monitor renal function and urine output.   GASTROINTESTINAL A:   No issues P:   Keep NPO SUP  HEMATOLOGIC A:   Low Hb from acute bleed P:  Follow CBC and coags  INFECTIOUS A:   No acute infection P:   On cefazolin for op prophylaxis  ENDOCRINE A:   No issues P:   SSI  NEUROLOGIC A:   Sedation for intubation P:   RASS goal: -1 Fentanyl drip Versed PRN  FAMILY  - Updates: No family present 2/18 - Inter-disciplinary family meet or Palliative Care meeting due by:  2/24.  Independent CC time 32 minutes   Levy Pupa, MD, PhD 03/28/2015, 11:38 AM [facility] [phone] or  if no answer [phone]

## 2015-03-28 NOTE — Progress Notes (Signed)
Spoke with Dr. Danielle Dess regarding patients spinal movement limitations. He states that the patient does not have to lay completely flat and that it is ok to elevate the head of bed.   Wilmon Arms, RN

## 2015-03-28 NOTE — Progress Notes (Signed)
Patient ID: Kristina Estes, female   DOB: 26-Jun-1970, 45 y.o.   MRN: [id number] Patient is maintaining her vital signs and urine output She is heavily sedated at this time Abdominal closure per vascular service Will continue to standby

## 2015-03-28 NOTE — Progress Notes (Signed)
   Daily Progress Note  Assessment/Planning: POD #1 s/p Xlap, Repair of L CIV, ligation distal L CIA, L CIA to CFA bypass, Abd wound VAC   Neuro: sedated for open abdomen and intubation  Pulm: sedated and intubated for open abdomen and massive resuscitation  CV: stable HD  GI: open ABD, return to OR for washout tomorrow  FEN: stable electrolytes, ok to reduce MIVF as needed, can convert to maintenance fluids  REN: 1.9 L UOP, net: +20 L  HEME/ID: stable H/H, suspect hemodilution  Vasc: anasarca, both calves soft with viable feet  Subjective  - 1 Day Post-Op  No events overnight  Objective Filed Vitals:   03/28/15 0400 03/28/15 0500 03/28/15 0600 03/28/15 0700  BP: 140/75  123/80 134/90  Pulse: 81 86 84 85  Temp:      TempSrc:      Resp: Height:      Weight:  332 lb 7.3 oz (150.8 kg)    SpO2: 100% 100% 100% 100%    Intake/Output Summary (Last 24 hours) at 03/28/15 0727 Last data filed at 03/28/15 0600  Gross per 24 hour  Intake 33332.32 ml  Output  16109 ml  Net 20967.32 ml   NEURO Propofol and Fentanyl drip PULM  BLL rales Vent Mode:  [-] PRVC FiO2 (%):  [40 %-60 %] 60 % Set Rate:  [10 bmp-20 bmp] 20 bmp Vt Set:  [450 mL-760 mL] 450 mL PEEP:  [5 cmH20] 5 cmH20 Plateau Pressure:  [24 cmH20-27 cmH20] 24 cmH20   ABG    Component Value Date/Time   PHART 7.409 03/27/2015 2200   PCO2ART 53.8* 03/27/2015 2200   PO2ART 60.3* 03/27/2015 2200   HCO3 33.3* 03/27/2015 2200   TCO2 35.0 03/27/2015 2200   ACIDBASEDEF 5.0* 03/27/2015 2155   O2SAT 89.5 03/27/2015 2200   CV  RRR GI  soft, open abd with adherent wound VAC, 1.4 L out VAC VASC  B feet viable, soft calves (L slightly tighter than R), L groin incisional VAC in place  Laboratory CBC    Component Value Date/Time   WBC 7.5 03/28/2015 0546   HGB 9.1* 03/28/2015 0546   HCT 26.7* 03/28/2015 0546   PLT 119* 03/28/2015 0547    BMET    Component Value Date/Time   NA 144 03/28/2015 0547     K 3.6 03/28/2015 0547   CL 113* 03/28/2015 0547   CO2 22 03/28/2015 0547   GLUCOSE 114* 03/28/2015 0547   BUN 10 03/28/2015 0547   CREATININE 0.91 03/28/2015 0547   CALCIUM 7.7* 03/28/2015 0547   GFRNONAA >60 03/28/2015 0547   GFRAA >60 03/28/2015 0547    Kristina Sake, MD [facility] Office: [phone] Pager: [phone]  03/28/2015, 7:27 AM

## 2015-03-28 NOTE — Progress Notes (Signed)
Initial Nutrition Assessment  DOCUMENTATION CODES:   Morbid obesity  INTERVENTION:  - If pt to remain intubated >/= 24 hours, recommend initiation of TF. Recommend Vital High Protein @ 10 mL/hr with 60 mL Prostat TID and 30 mL Prostat once/day to provide 940 kcal (2009 kcal including kcal from Propofol), 126 grams protein (96% estimated protein needs), and 201 mL free water - RD will continue to monitor for needs  NUTRITION DIAGNOSIS:   Inadequate oral intake related to inability to eat as evidenced by NPO status.  GOAL:   Provide needs based on ASPEN/SCCM guidelines  MONITOR:   Vent status, Weight trends, Labs, I & O's  REASON FOR ASSESSMENT:   Ventilator  ASSESSMENT:   45 year old with history of asthma, arthritis, obesity. She had an elective admission today for L5 laminectomy. Intra-Op she went into shock, hemodynamic instability. Subsequent CT showed large left retroperitoneal hematoma. She was put on 2 pressors (norepi, epinephrine) and transferred to ICU. PCCM consulted for help with management. Vascular surgery was also consulted. Intra OP TEE showed normal cardiac function.  Pt seen for new vent. BMI indicates morbid obesity. Pt is POD #1 ex lap with repair of L CIV, ligation distal L CIA, L CIA to CFA bypass and wound VAC to abdominal incision is in place.   Patient is currently intubated on ventilator support with NGT in place. MV: 9.4 L/min Temp (24hrs), Avg:96.7 F (35.9 C), Min:94.5 F (34.7 C), Max:98 F (36.7 C)  Propofol: 40.5 ml/hr (1069 kcal)  No family/visitors present to provide information from PTA. Physical assessment indicates no muscle or fat wasting, mild edema to BLE. Per chart review, pt has gained 36 lbs in the past 10 days which is likely at least partly fluid related; will need to continue to monitor weight trends and adjust nutrition needs as warranted in light of this. Current needs based on weight from 03/18/15 (134.4 kg).  Not meeting needs.  Medications reviewed. Labs reviewed; Cl: 113 mmol/L, Ca: 7.7 mg/dL. Drips: Fentanyl @ 200 mcg/hr, Propofol @ 40.5 mL/hr.      Diet Order:  Diet NPO time specified  Skin:  Wound (see comment) (Incisions to back, abdomen, and groin from 03/27/15)  Last BM:  PTA  Height:   Ht Readings from Last 1 Encounters:  03/27/15  (1.6 m)    Weight:   Wt Readings from Last 1 Encounters:  03/28/15 332 lb 7.3 oz (150.8 kg)    Ideal Body Weight:  52.27 kg (kg)  BMI:  Body mass index is 58.91 kg/(m^2).  Estimated Nutritional Needs:   Kcal:  4098-1191 (11-14 kcal/kg ABW)  Protein:  131 grams (2.5 grams/kg IBW)  Fluid:  1.8-2 L/day  EDUCATION NEEDS:   No education needs identified at this time     Trenton Gammon, RD, LDN Inpatient Clinical Dietitian Pager # [phone] After hours/weekend pager # [phone]

## 2015-03-29 ENCOUNTER — Inpatient Hospital Stay (HOSPITAL_COMMUNITY): Payer: PRIVATE HEALTH INSURANCE

## 2015-03-29 ENCOUNTER — Inpatient Hospital Stay (HOSPITAL_COMMUNITY): Payer: PRIVATE HEALTH INSURANCE | Admitting: Certified Registered Nurse Anesthetist

## 2015-03-29 ENCOUNTER — Encounter (HOSPITAL_COMMUNITY)
Admission: RE | Disposition: A | Discharge status: Skilled Nursing Facility | Payer: Self-pay | Source: Ambulatory Visit | Attending: Neurosurgery

## 2015-03-29 DIAGNOSIS — M7989 Other specified soft tissue disorders: Secondary | ICD-10-CM

## 2015-03-29 HISTORY — PX: ABDOMINAL EXPOSURE: SHX5708

## 2015-03-29 LAB — GLUCOSE, CAPILLARY
Glucose-Capillary: 115 mg/dL — ABNORMAL HIGH (ref 65–99)
Glucose-Capillary: 69 mg/dL (ref 65–99)

## 2015-03-29 LAB — CBC
HCT: 24.9 % — ABNORMAL LOW (ref 36.0–46.0)
HEMATOCRIT: 24.4 % — AB (ref 36.0–46.0)
HEMOGLOBIN: 8.7 g/dL — AB (ref 12.0–15.0)
Hemoglobin: 8.4 g/dL — ABNORMAL LOW (ref 12.0–15.0)
MCH: 30.7 pg (ref 26.0–34.0)
MCH: 28.8 pg (ref 26.0–34.0)
MCHC: 35.7 g/dL (ref 30.0–36.0)
MCHC: 33.7 g/dL (ref 30.0–36.0)
MCV: 86.2 fL (ref 78.0–100.0)
MCV: 85.3 fL (ref 78.0–100.0)
PLATELETS: 126 10*3/uL — AB (ref 150–400)
Platelets: 113 10*3/uL — ABNORMAL LOW (ref 150–400)
RBC: 2.83 MIL/uL — ABNORMAL LOW (ref 3.87–5.11)
RBC: 2.92 MIL/uL — AB (ref 3.87–5.11)
RDW: 15.1 % (ref 11.5–15.5)
RDW: 15.2 % (ref 11.5–15.5)
WBC: 8.8 10*3/uL (ref 4.0–10.5)
WBC: 10.9 10*3/uL — AB (ref 4.0–10.5)

## 2015-03-29 LAB — BASIC METABOLIC PANEL
ANION GAP: 7 (ref 5–15)
BUN: 5 mg/dL — ABNORMAL LOW (ref 6–20)
CALCIUM: 7.5 mg/dL — AB (ref 8.9–10.3)
CHLORIDE: 114 mmol/L — AB (ref 101–111)
CO2: 23 mmol/L (ref 22–32)
Creatinine, Ser: 0.62 mg/dL (ref 0.44–1.00)
GFR calc non Af Amer: >60 mL/min (ref >60)
GLUCOSE: 123 mg/dL — AB (ref 65–99)
Potassium: 3.1 mmol/L — ABNORMAL LOW (ref 3.5–5.1)
Sodium: 144 mmol/L (ref 135–145)

## 2015-03-29 LAB — CK: CK TOTAL: 315 U/L — AB (ref 38–234)

## 2015-03-29 LAB — POCT I-STAT 3, ART BLOOD GAS (G3+)
Acid-base deficit: 1 mmol/L (ref 0.0–2.0)
BICARBONATE: 22.7 meq/L (ref 20.0–24.0)
O2 Saturation: 100 %
PCO2 ART: 34.7 mmHg — AB (ref 35.0–45.0)
PH ART: 7.424 (ref 7.350–7.450)
PO2 ART: 161 mmHg — AB (ref 80.0–100.0)
TCO2: 24 mmol/L (ref 0–100)

## 2015-03-29 LAB — HEPARIN LEVEL (UNFRACTIONATED): Heparin Unfractionated: 0.34 IU/mL (ref 0.30–0.70)

## 2015-03-29 LAB — MAGNESIUM: Magnesium: 2 mg/dL (ref 1.7–2.4)

## 2015-03-29 SURGERY — ABDOMINAL EXPOSURE
Anesthesia: General

## 2015-03-29 MED ORDER — MIDAZOLAM HCL 2 MG/2ML IJ SOLN
INTRAMUSCULAR | Status: AC
  Filled 2015-03-29: qty 4

## 2015-03-29 MED ORDER — ROCURONIUM BROMIDE 100 MG/10ML IV SOLN
INTRAVENOUS | Status: DC | PRN
  Administered 2015-03-29 (×2): 25 mg via INTRAVENOUS

## 2015-03-29 MED ORDER — 0.9 % SODIUM CHLORIDE (POUR BTL) OPTIME
TOPICAL | Status: DC | PRN
  Administered 2015-03-29: 2000 mL

## 2015-03-29 MED ORDER — POTASSIUM CHLORIDE 20 MEQ/15ML (10%) PO SOLN
40.0000 meq | Freq: Once | ORAL | Status: AC
  Administered 2015-03-29: 40 meq via ORAL
  Filled 2015-03-29: qty 30

## 2015-03-29 MED ORDER — HEPARIN (PORCINE) IN NACL 100-0.45 UNIT/ML-% IJ SOLN
16.0000 [IU]/kg/h | INTRAMUSCULAR | Status: DC
  Administered 2015-03-29 – 2015-03-31 (×4): 16 [IU]/kg/h via INTRAVENOUS
  Filled 2015-03-29 (×5): qty 250

## 2015-03-29 MED ORDER — MIDAZOLAM HCL 2 MG/2ML IJ SOLN
INTRAMUSCULAR | Status: DC | PRN
  Administered 2015-03-29: 4 mg via INTRAVENOUS

## 2015-03-29 MED ORDER — CEFAZOLIN SODIUM-DEXTROSE 2-3 GM-% IV SOLR
INTRAVENOUS | Status: AC
  Filled 2015-03-29: qty 100

## 2015-03-29 MED ORDER — DEXTROSE 5 % IV SOLN
3.0000 g | INTRAVENOUS | Status: DC | PRN
  Administered 2015-03-29: 3 g via INTRAVENOUS

## 2015-03-29 MED ORDER — HEPARIN BOLUS VIA INFUSION
4000.0000 [IU] | INTRAVENOUS | Status: AC
  Administered 2015-03-29: 4000 [IU] via INTRAVENOUS
  Filled 2015-03-29: qty 4000

## 2015-03-29 MED ORDER — FUROSEMIDE 10 MG/ML IJ SOLN
20.0000 mg | Freq: Two times a day (BID) | INTRAMUSCULAR | Status: DC
  Administered 2015-03-29 – 2015-04-01 (×6): 20 mg via INTRAVENOUS
  Filled 2015-03-29 (×5): qty 2

## 2015-03-29 SURGICAL SUPPLY — 28 items
CANISTER SUCTION 2500CC (MISCELLANEOUS) ×3 IMPLANT
CANISTER WOUND CARE 500ML ATS (WOUND CARE) ×3 IMPLANT
COVER SURGICAL LIGHT HANDLE (MISCELLANEOUS) ×3 IMPLANT
DRAPE LAPAROSCOPIC ABDOMINAL (DRAPES) ×3 IMPLANT
ELECT REM PT RETURN 9FT ADLT (ELECTROSURGICAL) ×3
ELECTRODE REM PT RTRN 9FT ADLT (ELECTROSURGICAL) ×1 IMPLANT
GLOVE BIO SURGEON STRL SZ 6.5 (GLOVE) ×4 IMPLANT
GLOVE BIO SURGEONS STRL SZ 6.5 (GLOVE) ×2
GLOVE BIOGEL PI IND STRL 6.5 (GLOVE) ×4 IMPLANT
GLOVE BIOGEL PI IND STRL 7.5 (GLOVE) ×1 IMPLANT
GLOVE BIOGEL PI INDICATOR 6.5 (GLOVE) ×8
GLOVE BIOGEL PI INDICATOR 7.5 (GLOVE) ×2
GLOVE ECLIPSE 6.5 STRL STRAW (GLOVE) ×3 IMPLANT
GLOVE SURG SS PI 7.5 STRL IVOR (GLOVE) ×3 IMPLANT
GOWN STRL REUS W/ TWL LRG LVL3 (GOWN DISPOSABLE) ×5 IMPLANT
GOWN STRL REUS W/TWL 2XL LVL3 (GOWN DISPOSABLE) ×3 IMPLANT
GOWN STRL REUS W/TWL LRG LVL3 (GOWN DISPOSABLE) ×10
KIT BASIN OR (CUSTOM PROCEDURE TRAY) ×3 IMPLANT
KIT ROOM TURNOVER OR (KITS) ×3 IMPLANT
NS IRRIG 1000ML POUR BTL (IV SOLUTION) ×6 IMPLANT
PACK GENERAL/GYN (CUSTOM PROCEDURE TRAY) ×3 IMPLANT
PAD ARMBOARD 7.5X6 YLW CONV (MISCELLANEOUS) ×6 IMPLANT
SPONGE ABDOMINAL VAC ABTHERA (MISCELLANEOUS) ×3 IMPLANT
STAPLER VISISTAT 35W (STAPLE) ×3 IMPLANT
SUT VIC AB 3-0 SH 27 (SUTURE) ×6
SUT VIC AB 3-0 SH 27X BRD (SUTURE) ×3 IMPLANT
TOWEL OR 17X26 10 PK STRL BLUE (TOWEL DISPOSABLE) ×6 IMPLANT
WATER STERILE IRR 1000ML POUR (IV SOLUTION) ×6 IMPLANT

## 2015-03-29 NOTE — Progress Notes (Signed)
ANTICOAGULATION CONSULT NOTE - Initial Consult  Pharmacy Consult for IV Heparin infusion Indication: L femoral DVT  Allergies  Allergen Reactions  . Propoxyphene Other (See Comments)    Patient Measurements: Height:  (160 cm) Weight: (!) 335 lb 1.6 oz (152 kg) IBW/kg (Calculated) : 52.4 Heparin Dosing Weight: 91.5 kg  Vital Signs: Temp: 97.5 F (36.4 C) (02/19 1257) Temp Source: Axillary (02/19 1257) BP: 98/54 mmHg (02/19 1300) Pulse Rate: 80 (02/19 1200)  Labs:  Recent Labs  03/27/15 1210  03/27/15 2205 03/27/15 2206 03/28/15 0546 03/28/15 0547 03/28/15 0845 03/28/15 1805 03/29/15 0407  HGB 7.4*  < > 9.2*  --  9.1*  --   --   --  8.7*  HCT 22.6*  < > 27.0*  --  26.7*  --   --   --  24.4*  PLT 259  < > 108* 111* 112* 119*  --   --  113*  APTT 28  < > 37 36  --  30  --   --   --   LABPROT 18.0*  < > 16.5* 16.4*  --  15.6*  --   --   --   INR 1.48  < > 1.32 1.31  --  1.23  --   --   --   CREATININE 0.94  --  0.77  --   --  0.91  --   --  0.62  CKTOTAL 85  --   --   --   --   --  374* 359* 315*  CKMB 4.0  --   --   --   --   --   --   --   --   TROPONINI 0.11*  --   --   --   --   --   --   --   --   < > = values in this interval not displayed.  Estimated Creatinine Clearance: 129.3 mL/min (by C-G formula based on Cr of 0.62).   Medical History: Past Medical History  Diagnosis Date  . Anginal pain (HCC) 2016    received referral to [facility]  . Dysrhythmia   . Asthma     as a child  . GERD (gastroesophageal reflux disease)   . Arthritis   . Obesity   . Difficult intravenous access 03/27/2015    Patient has no PIV targets, will require central line or PICC anytime general anesthesia is needed    Medications:  Prescriptions prior to admission  Medication Sig Dispense Refill Last Dose  . aspirin-sod bicarb-citric acid (ALKA-SELTZER) 325 MG TBEF tablet Take 325 mg by mouth every 6 (six) hours as needed (heartburn).   Past Week  at Unknown time  . cyclobenzaprine (FLEXERIL) 10 MG tablet Take 10 mg by mouth 3 (three) times daily as needed for muscle spasms.   Past Week at Unknown time  . gabapentin (NEURONTIN) 300 MG capsule Take 300 mg by mouth 3 (three) times daily.   Past Week at Unknown time  . meloxicam (MOBIC) 15 MG tablet Take 15 mg by mouth daily.   Past Week at Unknown time  . oxybutynin (DITROPAN-XL) 5 MG 24 hr tablet Take 10 mg by mouth at bedtime.  3 Past Week at Unknown time  . phentermine (ADIPEX-P) 37.5 MG tablet Take 37.5 mg by mouth daily before breakfast. Reported on 03/18/2015   More than a month at Unknown time   Scheduled:  . antiseptic oral rinse  7 mL Mouth Rinse QID  . chlorhexidine gluconate  15 mL Mouth Rinse BID  . docusate sodium  100 mg Oral Daily  . enoxaparin (LOVENOX) injection  40 mg Subcutaneous Q24H  . fentaNYL (SUBLIMAZE) injection  50 mcg Intravenous Once  . pantoprazole (PROTONIX) IV  40 mg Intravenous QHS  . sodium chloride flush  3 mL Intravenous Q12H    Assessment: 45 y.o female , morbidly obese  with hemorraghic shock from large RP hematoma that occurred during elective operation for L5 laminectomy on admit date 03/27/15, s/p L CIV repair on 2/17 PM.  Intra OP TEE showed normal cardiac function. Returned to OR today 2/19 and now she is s/p abdominal washout and application of wound vac.    B DVT duplex consistent with L CFV DVT.  Dr. Imogene Burn, vascular surgery noted that given no active bleeding intraoperatively, he will start heparin gtt and that patient is NOT a candidate for pharmacomechanical thrombectomy given presence of injury in the L CIV. Pharmacy consulted to dose IV heparin.  The patient received Lovenox  SQ on 03/28/15 @ 14:03.  SCr 0.62, Hgb 7.4>9.2>9.1> 8.7 low/stable, pltc  259>111>119>113K  I paged Dr. Imogene Burn regarding recent RP hematoma & if okay to give full heparin bolus for +DVT- he said okay to give bolus as pt has stopped bleeding.   Goal of Therapy:   Heparin level 0.3-0.7 units/ml Monitor platelets by anticoagulation protocol: Yes   Plan:  Discontinue SQ lovenox.  Heparin bolus 4000 units IV x1 Start IV heparin drip at 1500 units/hr Check 6 hour heparin level  Daily heparin level and CBC.    Thank you for allowing pharmacy to be part of this patients care team. Noah Delaine, RPh Clinical Pharmacist Pager: [phone] 03/29/2015,1:23 PM

## 2015-03-29 NOTE — Interval H&P Note (Signed)
History and Physical Interval Note:  03/28/12 0730   Kristina Estes  has presented today for surgery, with the diagnosis of retroperitoneal hematoma  The various methods of treatment have been discussed with the patient and family. After consideration of risks, benefits and other options for treatment, the patient has consented to  Procedure(s): ABDOMINAL WASHOUT AND APPLICATION OF WOUND VAC (N/A) as a surgical intervention .  The patient's history has been reviewed, patient examined, no change in status, stable for surgery.  I have reviewed the patient's chart and labs.  Questions were answered to the patient's satisfaction.     Leonides Sake

## 2015-03-29 NOTE — Anesthesia Preprocedure Evaluation (Signed)
Anesthesia Evaluation  Patient identified by MRN, date of birth, ID band Patient unresponsive    Reviewed: Allergy & Precautions, H&P , NPO status , Patient's Chart, lab work & pertinent test results, Unable to perform ROS - Chart review only  History of Anesthesia Complications (+) DIFFICULT IV STICK / SPECIAL LINE and history of anesthetic complications  Airway Mallampati: Intubated  TM Distance: >3 FB Neck ROM: full    Dental no notable dental hx.    Pulmonary asthma ,    Pulmonary exam normal breath sounds clear to auscultation       Cardiovascular (-) anginaNormal cardiovascular exam Rhythm:regular Rate:Normal  03/24/15 Exercise Stress: Conclusion: No chest pain, normal stress, low risk for ischemia.    Neuro/Psych negative neurological ROS     GI/Hepatic Neg liver ROS, GERD  ,  Endo/Other  Morbid obesity  Renal/GU negative Renal ROS     Musculoskeletal  (+) Arthritis ,   Abdominal (+) + obese,   Peds  Hematology negative hematology ROS (+)   Anesthesia Other Findings   Reproductive/Obstetrics negative OB ROS                             Anesthesia Physical  Anesthesia Plan  ASA: IV and emergent  Anesthesia Plan: General   Post-op Pain Management:    Induction: Intravenous  Airway Management Planned: Oral ETT  Additional Equipment: CVP and Arterial line  Intra-op Plan:   Post-operative Plan: Post-operative intubation/ventilation  Informed Consent: I have reviewed the patients History and Physical, chart, labs and discussed the procedure including the risks, benefits and alternatives for the proposed anesthesia with the patient or authorized representative who has indicated his/her understanding and acceptance.     Plan Discussed with: CRNA  Anesthesia Plan Comments:         Anesthesia Quick Evaluation

## 2015-03-29 NOTE — Progress Notes (Signed)
Brief PCCM Progress Note  Pt has gone to OR for lavage and clean-out. Plan is for her to come back with wound vacs in place. Will likely be several more trips to the OR before they will be able to close the abdomen. We will continue same sedation and vent plans, no intention to extubate as long as she needs serial procedures.   Levy Pupa, MD, PhD 03/29/2015, 8:13 AM [facility] [phone] or if no answer [phone]

## 2015-03-29 NOTE — Anesthesia Postprocedure Evaluation (Signed)
Anesthesia Post Note  Patient: Kristina Estes  Procedure(s) Performed: Procedure(s) (LRB): ABDOMINAL WASHOUT AND APPLICATION OF WOUND VAC (N/A)  Patient location during evaluation: SICU Anesthesia Type: General Level of consciousness: sedated Vital Signs Assessment: post-procedure vital signs reviewed and stable Respiratory status: patient on ventilator - see flowsheet for VS and patient remains intubated per anesthesia plan Cardiovascular status: blood pressure returned to baseline and stable Anesthetic complications: no    Last Vitals:  Filed Vitals:   03/29/15 0600 03/29/15 0700  BP: 97/59 94/58  Pulse: 82 80  Temp:    Resp: 20 20    Last Pain:  Filed Vitals:   03/29/15 0712  PainSc: 4                  Rosela Supak

## 2015-03-29 NOTE — Progress Notes (Signed)
Pt off the unit to OR at this time. CRNA manually ventilating to OR.

## 2015-03-29 NOTE — Progress Notes (Signed)
ANTICOAGULATION CONSULT NOTE  Pharmacy Consult for IV Heparin infusion Indication: L femoral DVT  Allergies  Allergen Reactions  . Propoxyphene Other (See Comments)    Patient Measurements: Height:  (160 cm) Weight: (!) 335 lb 1.6 oz (152 kg) IBW/kg (Calculated) : 52.4 Heparin Dosing Weight: 91.5 kg  Vital Signs: Temp: 98.5 F (36.9 C) (02/19 2000) Temp Source: Oral (02/19 2000) BP: 101/52 mmHg (02/19 2100) Pulse Rate: 81 (02/19 1522)  Labs:  Recent Labs  03/27/15 1210  03/27/15 2205 03/27/15 2206 03/28/15 0546 03/28/15 0547 03/28/15 0845 03/28/15 1805 03/29/15 0407 03/29/15 1936 03/29/15 1937  HGB 7.4*  < > 9.2*  --  9.1*  --   --   --  8.7*  --  8.4*  HCT 22.6*  < > 27.0*  --  26.7*  --   --   --  24.4*  --  24.9*  PLT 259  < > 108* 111* 112* 119*  --   --  113*  --  126*  APTT 28  < > 37 36  --  30  --   --   --   --   --   LABPROT 18.0*  < > 16.5* 16.4*  --  15.6*  --   --   --   --   --   INR 1.48  < > 1.32 1.31  --  1.23  --   --   --   --   --   HEPARINUNFRC  --   --   --   --   --   --   --   --   --  0.34  --   CREATININE 0.94  --  0.77  --   --  0.91  --   --  0.62  --   --   CKTOTAL 85  --   --   --   --   --  374* 359* 315*  --   --   CKMB 4.0  --   --   --   --   --   --   --   --   --   --   TROPONINI 0.11*  --   --   --   --   --   --   --   --   --   --   < > = values in this interval not displayed.  Estimated Creatinine Clearance: 129.3 mL/min (by C-G formula based on Cr of 0.62).   Medical History: Past Medical History  Diagnosis Date  . Anginal pain (HCC) 2016    received referral to [facility]  . Dysrhythmia   . Asthma     as a child  . GERD (gastroesophageal reflux disease)   . Arthritis   . Obesity   . Difficult intravenous access 03/27/2015    Patient has no PIV targets, will require central line or PICC anytime general anesthesia is needed    Medications:  Prescriptions prior to admission   Medication Sig Dispense Refill Last Dose  . aspirin-sod bicarb-citric acid (ALKA-SELTZER) 325 MG TBEF tablet Take 325 mg by mouth every 6 (six) hours as needed (heartburn).   Past Week at Unknown time  . cyclobenzaprine (FLEXERIL) 10 MG tablet Take 10 mg by mouth 3 (three) times daily as needed for muscle spasms.   Past Week at Unknown time  . gabapentin (NEURONTIN) 300 MG capsule Take 300 mg by mouth 3 (  three) times daily.   Past Week at Unknown time  . meloxicam (MOBIC) 15 MG tablet Take 15 mg by mouth daily.   Past Week at Unknown time  . oxybutynin (DITROPAN-XL) 5 MG 24 hr tablet Take 10 mg by mouth at bedtime.  3 Past Week at Unknown time  . phentermine (ADIPEX-P) 37.5 MG tablet Take 37.5 mg by mouth daily before breakfast. Reported on 03/18/2015   More than a month at Unknown time   Scheduled:  . antiseptic oral rinse  7 mL Mouth Rinse QID  . chlorhexidine gluconate  15 mL Mouth Rinse BID  . docusate sodium  100 mg Oral Daily  . fentaNYL (SUBLIMAZE) injection  50 mcg Intravenous Once  . furosemide  20 mg Intravenous Q12H  . pantoprazole (PROTONIX) IV  40 mg Intravenous QHS  . sodium chloride flush  3 mL Intravenous Q12H    Assessment: 45 y.o female , morbidly obese  with hemorraghic shock from large RP hematoma that occurred during elective operation for L5 laminectomy on admit date 03/27/15, s/p L CIV repair on 2/17 PM.  Intra OP TEE showed normal cardiac function. Returned to OR today 2/19 and now she is s/p abdominal washout and application of wound vac.    B DVT duplex consistent with L CFV DVT.  Dr. Imogene Burn, vascular surgery noted that given no active bleeding intraoperatively, he will start heparin gtt. Pharmacy consulted to dose IV heparin.  The patient received Lovenox  SQ on 03/28/15 @ 14:03.  SCr 0.62, Hgb 7.4>9.2>9.1> 8.7 low/stable, pltc  259>111>119>113K  Heparin level this evening 0.34, currently therapeutic. Will continue current rate and check a confirmatory level in  the morning.  Goal of Therapy:  Heparin level 0.3-0.7 units/ml Monitor platelets by anticoagulation protocol: Yes   Plan:  Continue IV heparin drip at 1500 units/hr Check 6 hour heparin level  Daily heparin level and CBC.    Thank you for allowing [place] to participate in this patients care. Signe Colt, PharmD Pager: [phone]  03/29/2015,10:16 PM

## 2015-03-29 NOTE — Progress Notes (Signed)
   Daily Progress Note  Unable to close abd fascia still.  Will try again Tuesday.  Will get Gen Surgery's input tomorrow as mesh closure vs component separation may also be needed.  B DVT duplex consistent with L CFV DVT.  Not surprising given injury of of L CIV.  Given no active bleeding intraoperatively, will start heparin gtt.  This patient is NOT a candidate for pharmacomechanical thrombectomy given presence of injury in the L CIV.   Leonides Sake, MD [facility] Office: [phone] Pager: [phone]  03/29/2015, 1:09 PM

## 2015-03-29 NOTE — Progress Notes (Signed)
*  Preliminary Results* Bilateral lower extremity venous duplex completed. Study was very technically difficult and limited due to patient body habitus and edema. There is no obvious evidence of deep vein thrombosis involving the visualized veins of the right lower extremity. There is evidence of mobile acute deep vein thrombosis involving the left common femoral vein. Unable to determine extension of DVT due to technical limitations. No Baker's cyst bilaterally.  Preliminary results discussed with Doreatha Massed, PA-C and Dr. Imogene Burn.  03/29/2015  Gertie Fey, RVT, RDCS, RDMS

## 2015-03-29 NOTE — H&P (View-Only) (Signed)
Referred by:  Dr. Conchita Paris  Reason for referral: Retroperitoneal hematoma   History of Present Illness  Kristina Estes is a 45 y.o. ([birth year]) female who presents with chief complaint: acute hypotension.  This patient is intubated and sedated so history is obtained from the patient's family and Careers adviser.  This patient was in the process of a L5/S1 spine procedure when the was acute onset of hypotension.  There was no blood in the field.  The patient was closed emergently and taken to the Neuro ICU.  The patient has been at least 2 u pRBC transfused and requiring vasopressor support.     Past Medical History  Diagnosis Date  . Anginal pain (HCC) 2016    received referral to [facility]  . Dysrhythmia   . Asthma     as a child  . GERD (gastroesophageal reflux disease)   . Arthritis   . Obesity   . Difficult intravenous access 03/27/2015    Patient has no PIV targets, will require central line or PICC anytime general anesthesia is needed    Past Surgical History  Procedure Laterality Date  . Ectopic pregnancy surgery  1998    [place], [place]   . Carpal tunnel release Bilateral 2004    [facility]  . Cholecystectomy  2014    [facility] in [place]  . Diagnostic laparoscopy  2014  . Repair knee ligament Right 2014    [place], [place]  . Tonsillectomy    . Tonsillectomy and adenoidectomy  1998  . Fracture surgery      left foot   . Wisdom tooth extraction    . Colonoscopy      Social History   Social History  . Marital Status: Married    Spouse Name: N/A  . Number of Children: N/A  . Years of Education: N/A   Occupational History  . Not on file.   Social History Main Topics  . Smoking status: Never Smoker   . Smokeless tobacco: Not on file  . Alcohol Use: No  . Drug Use: No  . Sexual Activity: Not on file   Other Topics Concern  . Not on file   Social History Narrative    Family History: cannot be obtained as patient  sedated and intubated   Current Facility-Administered Medications  Medication Dose Route Frequency Provider Last Rate Last Dose  . 0.9 %  sodium chloride infusion   Intravenous Continuous Lisbeth Renshaw, MD 75 mL/hr at 03/27/15 1330    . 0.9 %  sodium chloride infusion  250 mL Intravenous Continuous Lisbeth Renshaw, MD      . 0.9 %  sodium chloride infusion   Intravenous Once Rogelia Boga, CRNA      . acetaminophen (TYLENOL) tablet 650 mg  650 mg Oral Q4H PRN Lisbeth Renshaw, MD       Or  . acetaminophen (TYLENOL) suppository 650 mg  650 mg Rectal Q4H PRN Lisbeth Renshaw, MD      . ceFAZolin (ANCEF) IVPB 2 g/50 mL premix  2 g Intravenous Q8H Lisbeth Renshaw, MD      . docusate sodium (COLACE) capsule 100 mg  100 mg Oral BID Lisbeth Renshaw, MD   100 mg at 03/27/15 1330  . EPINEPHrine (ADRENALIN) 4 mg in dextrose 5 % 250 mL (0.016 mg/mL) infusion  0.5-20 mcg/min Intravenous Titrated Lisbeth Renshaw, MD 9.4 mL/hr at 03/27/15 1335 2.5 mcg/min at 03/27/15 1335  . gabapentin (NEURONTIN) capsule 300 mg  300 mg Oral TID Lisbeth Renshaw, MD      . HYDROmorphone (DILAUDID) injection 0.5-1 mg  0.5-1 mg Intravenous Q2H PRN Lisbeth Renshaw, MD      . menthol-cetylpyridinium (CEPACOL) lozenge 3 mg  1 lozenge Oral PRN Lisbeth Renshaw, MD       Or  . phenol (CHLORASEPTIC) mouth spray 1 spray  1 spray Mouth/Throat PRN Lisbeth Renshaw, MD      . methocarbamol (ROBAXIN) tablet 500 mg  500 mg Oral Q6H PRN Lisbeth Renshaw, MD       Or  . methocarbamol (ROBAXIN) 500 mg in dextrose 5 % 50 mL IVPB  500 mg Intravenous Q6H PRN Lisbeth Renshaw, MD      . norepinephrine (LEVOPHED) 16 mg in dextrose 5 % 250 mL (0.064 mg/mL) infusion  0-40 mcg/min Intravenous Continuous Lisbeth Renshaw, MD 5.6 mL/hr at 03/27/15 1335 6 mcg/min at 03/27/15 1335  . ondansetron (ZOFRAN) injection 4 mg  4 mg Intravenous Q4H PRN Lisbeth Renshaw, MD      . oxybutynin (DITROPAN-XL) 24 hr tablet 10 mg  10 mg  Oral QHS Lisbeth Renshaw, MD      . oxyCODONE-acetaminophen (PERCOCET/ROXICET) 5-325 MG per tablet 1-2 tablet  1-2 tablet Oral Q4H PRN Lisbeth Renshaw, MD      . pantoprazole (PROTONIX) injection 40 mg  40 mg Intravenous QHS Lisbeth Renshaw, MD      . polyethylene glycol (MIRALAX / GLYCOLAX) packet 17 g  17 g Oral Daily PRN Lisbeth Renshaw, MD      . senna (SENOKOT) tablet 8.6 mg  1 tablet Oral BID Lisbeth Renshaw, MD   8.6 mg at 03/27/15 1330  . sodium chloride flush (NS) 0.9 % injection 3 mL  3 mL Intravenous Q12H Lisbeth Renshaw, MD      . sodium chloride flush (NS) 0.9 % injection 3 mL  3 mL Intravenous PRN Lisbeth Renshaw, MD      . sodium phosphate (FLEET) 7-19 GM/118ML enema 1 enema  1 enema Rectal Once PRN Lisbeth Renshaw, MD         Allergies  Allergen Reactions  . Propoxyphene Other (See Comments)    REVIEW OF SYSTEMS:  (Positives checked otherwise negative)  Cannot be obtained as sedated and intubated   Physical Examination  Filed Vitals:   03/27/15 0625 03/27/15 1300 03/27/15 1330 03/27/15 1345  BP: 128/70     Pulse: 78  110 108  Temp: 97.7 F (36.5 C) 97.4 F (36.3 C)    TempSrc: Oral Axillary    Resp: 18  14 15   Height: 5\' 5"  (1.651 m)     Weight: 296 lb (134.265 kg)     SpO2: 100%  100% 100%   Body mass index is 49.26 kg/(m^2).  General: intubated and sedate, obese  Head: [place]/AT  Ear/Nose/Throat: nares w/o erythema or drainage, oropharynx cannot be examined as intubated  Eyes: dilated pupils, somewhat reactive, EOM cannot be tested  Neck: not tested as sedated and intubated  Pulmonary: BLL rales, mechanical vent sounds  Cardiac: RRR, Nl S1, S2, no Murmurs, rubs or gallops  Vascular: Vessel Right Left  Radial Faintly Palpable Faintly Palpable  Brachial Faintly Palpable Faintly Palpable  Carotid Palpable, without bruit Palpable, without bruit  Aorta Not palpable N/A  Femoral Palpable Palpable  Popliteal Not palpable Not palpable    PT Not Palpable Not Palpable  DP Not Palpable Not Palpable   Gastrointestinal: soft, no grimace to palpation, ND, no G/R, no HSM, no masses, no CVAT  B  Musculoskeletal: Cannot test M/S as sedate and intubate, Extremities without ischemic changes   Neurologic: cannot test as sedated and intubated   Psychiatric: cannot test as sedated and intubated   Dermatologic: See M/S exam for extremity exam, no rashes otherwise noted on quick survey  Lymph : No Cervical, Axillary, or Inguinal lymphadenopathy   Laboratory: CBC:    Component Value Date/Time   WBC 7.9 03/27/2015 1210   RBC 2.60* 03/27/2015 1210   HGB 7.4* 03/27/2015 1210   HCT 22.6* 03/27/2015 1210   PLT 259 03/27/2015 1210   MCV 86.9 03/27/2015 1210   MCH 28.5 03/27/2015 1210   MCHC 32.7 03/27/2015 1210   RDW 12.5 03/27/2015 1210   LYMPHSABS 2.9 03/27/2015 1210   MONOABS 0.5 03/27/2015 1210   EOSABS 0.1 03/27/2015 1210   BASOSABS 0.0 03/27/2015 1210    BMP:    Component Value Date/Time   NA 136 03/27/2015 1210   K 2.8* 03/27/2015 1210   CL 108 03/27/2015 1210   CO2 18* 03/27/2015 1210   GLUCOSE 273* 03/27/2015 1210   BUN 11 03/27/2015 1210   CREATININE 0.94 03/27/2015 1210   CALCIUM 8.8* 03/27/2015 1210   GFRNONAA >60 03/27/2015 1210   GFRAA >60 03/27/2015 1210    Coagulation: Lab Results  Component Value Date   INR 1.48 03/27/2015   No results found for: PTT  Lipids: No results found for: CHOL, TRIG, HDL, CHOLHDL, VLDL, LDLCALC, LDLDIRECT   Radiology: Ct Abdomen Pelvis Wo Contrast  03/27/2015  CLINICAL DATA:  45 year old who became acutely hypotensive intraoperatively during L5-S1 posterior decompression and discectomy earlier today. EXAM: CT ABDOMEN AND PELVIS WITHOUT CONTRAST TECHNIQUE: Multidetector CT imaging of the abdomen and pelvis was performed following the standard protocol without IV contrast. COMPARISON:  None. FINDINGS: Lower chest: Blurred by respiratory motion. Mild dependent  atelectasis posteriorly in the lower lobes. Visualized lung bases otherwise clear. Normal heart size. Hepatobiliary: Beam hardening streak artifact as the patient was unable to raise the arms. Allowing for this, normal unenhanced appearance of the liver. Gallbladder surgically absent. No unexpected biliary ductal dilation. Pancreas: Normal unenhanced appearance. Spleen: Normal unenhanced appearance. Adrenals/Urinary Tract: Normal appearing adrenal glands. Unenhanced appearance of the right kidney. Acute perinephric and subcapsular hematoma involving the left kidney. No visible focal parenchymal abnormality involving the left kidney, allowing for the unenhanced technique. Urinary bladder decompressed by Foley catheter. Stomach/Bowel: Stomach normal in appearance for the degree of distention. Normal-appearing small bowel. Normal-appearing colon with expected stool burden. Normal appendix in the right upper pelvis. Vascular/Lymphatic: No visible aortoiliofemoral atherosclerosis. No pathologic lymphadenopathy. Reproductive: Normal-appearing uterus and ovaries without evidence of adnexal mass. Other: Large retroperitoneal hematoma involving predominantly the left side of the abdomen and pelvis, extending from the level of the upper pole of the left kidney inferiorly to the low pelvis, maximum measurements approximating 7.4 x 9.9 x 17.0 cm. The largest portion of the hematoma is anterior to the left psoas muscle. There is a smaller component of the retroperitoneal hematoma in the right side of the abdomen. The retroperitoneal hematoma displaces the left kidney anteriorly. A small amount of intraperitoneal hemorrhage is present in the paracolic gutters, left greater than right. Musculoskeletal: Postsurgical changes related to L5-S1 discectomy and posterior decompression at L5. Gas bubbles in the surgical tract, the epidural space and the L5-S1 disc. No abnormal fluid collection to suggest abscess or leak. IMPRESSION: 1.  Large retroperitoneal hematoma involving predominantly the left side of the abdomen and pelvis, maximum measurements given  above. 2. Subcapsular left renal hematoma and left perinephric hematoma, contiguous with the retroperitoneal hematoma. 3. Small amount of intraperitoneal hemorrhage in the paracolic gutters, left greater than right. 4. Expected postsurgical changes at L5-S1. Given the fact that the patient has a subcapsular left renal hematoma and a left perinephric hematoma, the possible etiology of the retroperitoneal hematoma may be due to a lesion in the left kidney. Repeat CT of the abdomen and pelvis with contrast may be helpful once the patient has recovered from surgery to exclude that possibility. These results were discussed directly with Dr. Conchita Paris at the time of interpretation 03/27/2015 at 1 o'clock p.m. Electronically Signed   By: Hulan Saas M.D.   On: 03/27/2015 13:27   US Abdomen Limited  03/27/2015  CLINICAL DATA:  Intraoperative exam to assess for abdominal/pelvic fluid/ hematoma. CT scan after this ultrasound reveals large retroperitoneal hematoma left-greater-than-right as well as left perinephric and subscapular hematoma of which Dr.Nundkumar is aware. EXAM: LIMITED ABDOMINAL ULTRASOUND COMPARISON:  CT 03/27/2015 FINDINGS: Limited all intraoperative ultrasound evaluation of the bilateral lower quadrants demonstrates no definite focal free fluid collection. IMPRESSION: No definite focal fluid collection within the lower quadrants. Electronically Signed   By: Elberta Fortis M.D.   On: 03/27/2015 14:13   Dg C-arm 1-60 Min-no Report  03/27/2015  CLINICAL DATA: plif 5-s1 C-ARM 1-60 MINUTES Fluoroscopy was utilized by the requesting physician.  No radiographic interpretation.    Medical Decision Making  Dustin Burrill is a 45 y.o. female who presents with: hemorrhagic shock, RPH, possible arterial injury s/p L5/S1 spinal surgery.   Given the patient's unstable status, large  RPH, and drop in H/H, I doubt this patient can be managed non-operatively.  I discussed with the patient's husband, that I felt that emergently exploring the patient's abdominal and repairing any aortic or iliac injuries was indicated in this case. The patient is aware the risks of this procedure include but are not limited to: bleeding, need for transfusion, infection, death, stroke, paralysis, wound complications, bowel injuries, bowel ischemia, extended ventilation and future ventral hernias and bowel obstructions.    Thank you for allowing [place] to participate in this patient's care.   Leonides Sake, MD [facility] Office: [phone] Pager: [phone]  03/27/2015, 2:11 PM

## 2015-03-29 NOTE — Op Note (Signed)
    OPERATIVE NOTE   PROCEDURE: 1. Abdominal washout 2. Negative pressure abdominal dressing  PRE-OPERATIVE DIAGNOSIS: open abdomen  POST-OPERATIVE DIAGNOSIS: same as above   SURGEON: Leonides Sake, MD  ASSISTANT(S): Doreatha Massed, PAC   ANESTHESIA: general  ESTIMATED BLOOD LOSS: 50 cc  FINDING(S): 1.  No further bleeding 2.  Decreased bowel swelling but continued retroperitoneal swelling 3.  No evidence of bowel injury 4.  Unable to reapproximate fascia  SPECIMEN(S):  none  INDICATIONS:   Kristina Estes is a 45 y.o. female who presents with open abdomen after a massive resuscitation after a iliac vessel injury during a spine procedure.  Her bowels during her first operation were very edematous so it was impossible to close her fascia.  She returns today for washout of the abdomen, closure of the retroperitoneum, and possible closure of fascia.     DESCRIPTION: After obtaining full informed written consent, the patient was brought back to the operating room and placed supine upon the operating table.  The patient received IV antibiotics prior to induction.  After obtaining adequate anesthesia, the patient was prepped and draped in the standard fashion for: abdominal exploration.  Her previous abdominal VAC dressing had been removed except for the interior drape.  I removed the interior drape and then placed the Balfour retractor to obtain lateral exposure.  I then placed the Omnitract retractor to get deeper retractor.  I placed the retractor to obtain exposure of the retroperitoneum .  There was some superficial thrombus noted on the bowels, but otherwise there was not bleeding or thrombus in the peritoneum.  There was some clot on the retroperitoneum but no active bleeding.  I placed figure of 8 stitches in the retroperitoneum to reapproximate the retroperitoneum.  This was somewhat difficult as there was still significant swelling in the retroperitoneum, likely related to the  retroperitoneal hematoma.  I ran the bowels once again and there was marked decreased edema and no evidence of bowel injury.  I replaced the bowels in their anatomic position.  I placed Kocher clamps on the fascia on each side and tried to reapproximate the fascia over the bowels.  This continued to be impossible.  I fashioned the internal drape for the abdominal VAC setup. This was applied on top of the bowels and omentum. I the placed two layer of sponges overlying the internal drape. I stapled the external sponge in place. The external sponge was affixed with adhesive panels. An incision was made in the central drape and the lilypad affixed. The lilypad was connected to a VAC pump and activated at 125 continuous. The abd VAC sponge became adherent.   COMPLICATIONS: none  CONDITION: stable   Leonides Sake, MD [facility] Office: [phone] Pager: [phone]  03/29/2015, 8:54 AM

## 2015-03-29 NOTE — Anesthesia Procedure Notes (Signed)
Date/Time: 03/29/2015 7:45 AM Performed by: Alanda Amass A Pre-anesthesia Checklist: Patient identified, Timeout performed, Emergency Drugs available, Suction available and Patient being monitored Patient Re-evaluated:Patient Re-evaluated prior to inductionOxygen Delivery Method: Circle system utilized Preoxygenation: Pre-oxygenation with 100% oxygen Intubation Type: Inhalational induction with existing ETT Placement Confirmation: positive ETCO2 and breath sounds checked- equal and bilateral Dental Injury: Teeth and Oropharynx as per pre-operative assessment

## 2015-03-29 NOTE — Transfer of Care (Signed)
Immediate Anesthesia Transfer of Care Note  Patient: Kristina Estes  Procedure(s) Performed: Procedure(s): ABDOMINAL WASHOUT AND APPLICATION OF WOUND VAC (N/A)  Patient Location: ICU  Anesthesia Type:General  Level of Consciousness: sedated and unresponsive  Airway & Oxygen Therapy: Patient remains intubated per anesthesia plan and Patient placed on Ventilator (see vital sign flow sheet for setting)  Post-op Assessment: Report given to RN and Post -op Vital signs reviewed and stable  Post vital signs: Reviewed and stable  Last Vitals:  Filed Vitals:   03/29/15 0600 03/29/15 0700  BP: 97/59 94/58  Pulse: 82 80  Temp:    Resp: 20 20    Complications: No apparent anesthesia complications

## 2015-03-29 NOTE — Op Note (Signed)
  OPERATIVE NOTE   PROCEDURE: 1. Exploratory laparotomy 2. Repair of left iliac vein (dictated by Dr. Early) 3. Ligation of distal left common iliac artery (dictated by Dr. Early) 4. Left common iliac artery to left common femoral artery bypass with 8 mm Dacron 5. Placement of abdominal negative pressure dressing  PRE-OPERATIVE DIAGNOSIS: retroperitoneal hematoma after spine procedure, hemorrhagic shock  POST-OPERATIVE DIAGNOSIS: same  CO-SURGEONS: Betzaira Mentel, MD; Todd Early, MD  ASSISTANT(S): Maureen Collins, PAC; Samantha Rhyne, PAC   ANESTHESIA: general  ESTIMATED BLOOD LOSS: 9 L  FLUID: 14 L  BLOOD PRODUCTS:  PRBC: 10 units  BRAT: 3200 cc  FFP: 16 units  Cryo: 8 units  PLT: 3 units  UOP: 500 cc  FINDING(S): 1.  Left common iliac artery injury with compromised 2/3 of circumference 2.  Left common iliac vein spiral tear 3.  Sigmoid colon diverticulitis 4.  Scattered adhesions to bowel and omentum 5.  Stool throughout colon 6.  Adhesions of the liver to the transverse colon 7.  Bowel edema throughout preventing abdominal closure 8.  Dopplerable left posterior tibial artery and anterior tibial artery   SPECIMEN(S):  none  INDICATIONS:   Kristina Estes is a 45 y.o. female who presents with hemorrhagic shock after an aborted spinal procedure.  The patient's CT abd/pelvis was consistent with likely retroperitoneal injury, so I recommended exploration and repair to the patient's family.  The patient is aware the risks of aortic surgery include but are not limited to: bleeding, need for transfusion, infection, death, stroke, paralysis, wound complications, bowel injuries, impotence, bowel ischemia, extended ventilation and future ventral hernias.     DESCRIPTION: After obtaining full informed written consent, the patient was brought back to the operating room and placed supine upon the operating table.  The patient received IV antibiotics prior to induction.   After obtaining adequate anesthesia, the patient was prepped and draped in the standard fashion for: aortic exploration.  A two-surgeon technique was utilized due to the complexity of this case and inability of less experienced personnel to maintain exposure in this morbidly obese patient.  I could not easily feel this patient's sternum or pubic bone.  Based on visual inspection, I determine what I thought was the mid-line.  I made an incision extending from the sternum down to the pubic bone.  I opened the subcutaneous tissue with electrocautery.  This turned out to be challenge due to 8 cm of fat in the anterior wall.  Eventually, I got down to fascia.  Finding the mid-line turned out to be a bit changing, and I initially was going to proceed with a paramedian opening in the rectus fascia due to the difficulty finding the mid-line.  Eventually, I did find the mid-line and opened the fascia under direct visualization.  Immediately upon entering the peritoneum, there was some free floating blood noted.  It was readily apparent due to her morbid obesity exposure was going to be difficult.  At this point, I put in the Bookwalter retractor to gain more exposure  I started to eviscerate her bowels to facilitate exploration of her abdomen, but her omentum appeared to be stuck, so some blunt dissection with electrocautery was necessary to free up the omentum.  I identified the ligament of Treitz and started to run the bowels when I found some adhesions in the distal ileum that required sharp dissection.  I eventually got this portion of the bowel free.  In running the colon, I found the transverse colon   to be adherent to the liver.  I did not visualize the gallbladder as I did not take down these adhesions.  The stomach appeared non-diseased with nasogastric tube in appropriate position.  Distally, there was extensive moderate sized diverticulae in the sigmoid colon.   As this point, I placed a Omni-tract retractor  system.   I eviscerated the transverse colon into a wet towel and then eviscerated the small intestines into a wet towel.  Retractor blades were applied to maintain exposure in this abdomen.  I could feel the aortic pulsation in the retroperitoneum.  There was obvious thrombus in the retroperitoneum.  Using electrocautery and blunt dissection, I dissected out the aorta from 3-4 cm distal to the renal arteries down to the aortic bifurcation.  I got control of both common iliac artery with vessel loops.  I slowly carried my dissection distally on the right common iliac artery and vein.  While there was some thrombus, I did not see an clear injury of either vessel.  I carried this process down the left common iliac artery and vein.  There appeared to be more thrombus on this side.  I had to extend my dissection distally, but it began to enter into the sigmoid mesentery, so my exposure was limited by this patient's anatomy and extreme obesity.  With additional retraction, we found a segment of the left common iliac artery and vein that was intensely bleeding.  I had to apply pressure with sponge sticks to maintain some control.  The patient was given 15000 units of Heparin intravenously, which was a therapeutic bolus. n additional 2000 units of Heparin was administered every hour after initial bolus to maintain anticoagulation.  In total, 19000 units of Heparin was administrated to achieve and maintain a therapeutic level of anticoagulation.  Dr. Early clamped the left proximal common iliac artery.  With great difficulty, I was able to eventually visualize and clamp the distal left common iliac artery.  There was essentially a defect in the left common iliac artery spanning 2/3 of the circumference of this segment.  Due to the difficulties with more distal dissection given this patient's obesity and the sigmoid mesentery, I did not feel, I could easily sew a jump graft from the left common iliac artery to the distal  common iliac artery.  At this point, Dr. Early oversewed the distal left common iliac artery (see his notes for details).  It became apparent also that there was a left common iliac vein injury with a spiral tear.  Dr. Early repaired this with a running stitch of 5-0 Prolene while I held pressure (see his notes for details).  At this point, we had control of the previously torrential bleeding.  I then identified the left common femoral artery under sonosite guidance.  I made an incision in left groin overlying the artery and dissected down the left common femoral artery with electrocautery.  I dissected out the femoral bifurcation and superficial femoral artery and profunda femoral artery.  I dissected from the right groin, immediately on top of the external iliac artery until I reached the transected left common iliac artery with a sponge stick.  I pulled an umbilical tape through the iliac tunnel.  I tied one end of the graft to the umbilical tape and pulled a 8 mm Dacron graft through the left iliac tunnel.  I spatulated the left proximal common iliac artery, cleaning up the edges in the process.  I then spatulated the graft.  I sewed   the graft to the proximal left common iliac artery with a running stitch of 5-0 Prolene, in an end-to-end configuration.  I then pulled the graft to appropriate tension and length.  I clamped the left proximal common femoral artery and put the superficial femoral artery and profunda femoral artery under tension.  I made an arteriotomy and then extended it proximally.  I spatulated the graft to the dimensions of the arteriotmy.  The graft was sewn to the artery in an end-to-side configuration with a running stitch of 5-0 Prolene.  Prior to completion of this, all vessels were backbled: reasonable bleeding from superficial femoral artery and profunda femoral artery and vigorous bleeding from left proximal common femoral artery.  I also allowed the graft to bleed.  There was  excellent pulsatile bleeding.    At this point, I started reversing the anticoagulation.  I gave 80 mg of Protamine in total.  Anesthesia was also aggressively reversing coagulopathies felt to be due to her intense bleeding with blood products.  I packed the left groin with Avitene, as did I pack the retroperitoneum.  The patient continued to decompress the left retroperitoneum hematoma via a rent in the retroperitoneum in the left perirenal area.  I briefly removed retractors to check on the bowel.  Significant edema was noted, as expected given the massive resuscitation.  I did not feel her fascia could be closed.  As I was planning on placing an abdominal wound VAC, I elected to leave the retroperitoneum open to continue decompressing the prior hematoma.  I will plan on closing the retroperitoneum at the first abdominal washout.    Eventually bleeding in both the abdomen and left groin slowed down.  The left groin was essentially dry.  I repaired this groin with a double layer of 2-0 Vicryl and a double layer of 3-0 Vicryl.  The skin was closed with staples.  I obtained a small VAC sponge and fashioned it for this incision.  I affixed the sponge with adhesive dressings and cut a hole in the central and affixed the lilypad.  The lilypad was connected to the VAC pump and the dressing became adherent at 125 continuous.  At this point, I turned my attention to the abdomen.  There no active bleeding but the patient did continue drainage from the retroperitoneal rent.  I ran the bowel from ligament of Treitz to sigmoid colon.  One area of ileum required a few Limbert stitches of 3-0 silk related to the lysis of adhesion.  No mucosa was evident in this segment.   I then fashioned the internal drape for the abdominal VAC setup.  This applied on top of the bowels and omentum.  I the placed two layer of sponges overlying the internal drape.  I stapled the external sponge in place.  The external sponge was affixed with  adhesive panels.  An incision was made in the central drape and the lilypad affixed.  The lilypad was connected to a VAC pump and activated at 125 continuous.  The abd VAC sponge became adherent.    Distally at the end of this case, there were doppler posterior tibial artery and anterior tibial artery signals.  The left calf was soft.   COMPLICATIONS: none  CONDITION: critical   Cashton Hosley, MD [facility] Office: [phone] Pager: [phone]  03/27/2015, 8:33 PM    

## 2015-03-29 NOTE — Progress Notes (Signed)
Patient ID: Kristina Estes, female   DOB: 10-Jan-1971, 45 y.o.   MRN: [id number] Patient in OR the same for abdominal exploration and possible closure Heavily sedated and anesthetized Remains on ventilator

## 2015-03-30 ENCOUNTER — Encounter (HOSPITAL_COMMUNITY): Payer: Self-pay | Admitting: Vascular Surgery

## 2015-03-30 DIAGNOSIS — R578 Other shock: Secondary | ICD-10-CM | POA: Insufficient documentation

## 2015-03-30 DIAGNOSIS — J9601 Acute respiratory failure with hypoxia: Secondary | ICD-10-CM | POA: Insufficient documentation

## 2015-03-30 LAB — POCT I-STAT 7, (LYTES, BLD GAS, ICA,H+H)
ACID-BASE DEFICIT: 7 mmol/L — AB (ref 0.0–2.0)
ACID-BASE DEFICIT: 8 mmol/L — AB (ref 0.0–2.0)
ACID-BASE DEFICIT: 6 mmol/L — AB (ref 0.0–2.0)
Acid-base deficit: 12 mmol/L — ABNORMAL HIGH (ref 0.0–2.0)
Acid-base deficit: 5 mmol/L — ABNORMAL HIGH (ref 0.0–2.0)
BICARBONATE: 20.1 meq/L (ref 20.0–24.0)
BICARBONATE: 20.3 meq/L (ref 20.0–24.0)
Bicarbonate: 16.8 mEq/L — ABNORMAL LOW (ref 20.0–24.0)
Bicarbonate: 20 mEq/L (ref 20.0–24.0)
Bicarbonate: 17.1 mEq/L — ABNORMAL LOW (ref 20.0–24.0)
CALCIUM ION: 0.76 mmol/L — AB (ref 1.12–1.23)
CALCIUM ION: 0.89 mmol/L — AB (ref 1.12–1.23)
CALCIUM ION: 0.73 mmol/L — AB (ref 1.12–1.23)
Calcium, Ion: 0.93 mmol/L — ABNORMAL LOW (ref 1.12–1.23)
Calcium, Ion: 0.83 mmol/L — ABNORMAL LOW (ref 1.12–1.23)
HCT: 29 % — ABNORMAL LOW (ref 36.0–46.0)
HCT: 26 % — ABNORMAL LOW (ref 36.0–46.0)
HEMATOCRIT: 29 % — AB (ref 36.0–46.0)
HEMATOCRIT: 29 % — AB (ref 36.0–46.0)
HEMATOCRIT: 24 % — AB (ref 36.0–46.0)
HEMOGLOBIN: 9.9 g/dL — AB (ref 12.0–15.0)
HEMOGLOBIN: 9.9 g/dL — AB (ref 12.0–15.0)
HEMOGLOBIN: 9.9 g/dL — AB (ref 12.0–15.0)
HEMOGLOBIN: 8.8 g/dL — AB (ref 12.0–15.0)
Hemoglobin: 8.2 g/dL — ABNORMAL LOW (ref 12.0–15.0)
O2 SAT: 100 %
O2 SAT: 100 %
O2 SAT: 100 %
O2 Saturation: 100 %
O2 Saturation: 100 %
PCO2 ART: 42.1 mmHg (ref 35.0–45.0)
PCO2 ART: 45.5 mmHg — AB (ref 35.0–45.0)
PCO2 ART: 33.6 mmHg — AB (ref 35.0–45.0)
PH ART: 7.273 — AB (ref 7.350–7.450)
PH ART: 7.372 (ref 7.350–7.450)
PH ART: 7.374 (ref 7.350–7.450)
PH ART: 7.325 — AB (ref 7.350–7.450)
PO2 ART: 422 mmHg — AB (ref 80.0–100.0)
PO2 ART: 437 mmHg — AB (ref 80.0–100.0)
PO2 ART: 271 mmHg — AB (ref 80.0–100.0)
POTASSIUM: 3.3 mmol/L — AB (ref 3.5–5.1)
POTASSIUM: 3.7 mmol/L (ref 3.5–5.1)
Patient temperature: 34.6
Patient temperature: 34.3
Potassium: 4 mmol/L (ref 3.5–5.1)
Potassium: 4.2 mmol/L (ref 3.5–5.1)
Potassium: 5.9 mmol/L — ABNORMAL HIGH (ref 3.5–5.1)
SODIUM: 140 mmol/L (ref 135–145)
SODIUM: 142 mmol/L (ref 135–145)
SODIUM: 144 mmol/L (ref 135–145)
SODIUM: 142 mmol/L (ref 135–145)
Sodium: 140 mmol/L (ref 135–145)
TCO2: 18 mmol/L (ref 0–100)
TCO2: 21 mmol/L (ref 0–100)
TCO2: 21 mmol/L (ref 0–100)
TCO2: 18 mmol/L (ref 0–100)
TCO2: 22 mmol/L (ref 0–100)
pCO2 arterial: 28.7 mmHg — ABNORMAL LOW (ref 35.0–45.0)
pCO2 arterial: 37.9 mmHg (ref 35.0–45.0)
pH, Arterial: 7.165 — CL (ref 7.350–7.450)
pO2, Arterial: 462 mmHg — ABNORMAL HIGH (ref 80.0–100.0)
pO2, Arterial: 501 mmHg — ABNORMAL HIGH (ref 80.0–100.0)

## 2015-03-30 LAB — POCT I-STAT 3, ART BLOOD GAS (G3+)
Acid-base deficit: 2 mmol/L (ref 0.0–2.0)
Bicarbonate: 22 mEq/L (ref 20.0–24.0)
O2 Saturation: 99 %
PCO2 ART: 31.3 mmHg — AB (ref 35.0–45.0)
PH ART: 7.455 — AB (ref 7.350–7.450)
TCO2: 23 mmol/L (ref 0–100)
pO2, Arterial: 133 mmHg — ABNORMAL HIGH (ref 80.0–100.0)

## 2015-03-30 LAB — BLOOD GAS, ARTERIAL
Acid-Base Excess: 8.5 mmol/L — ABNORMAL HIGH (ref 0.0–2.0)
Bicarbonate: 33.3 mEq/L — ABNORMAL HIGH (ref 20.0–24.0)
DELIVERY SYSTEMS: POSITIVE
DRAWN BY: 236041
Expiratory PAP: 5
FIO2: 0.6
Inspiratory PAP: 20
O2 Saturation: 89.5 %
PCO2 ART: 53.8 mmHg — AB (ref 35.0–45.0)
Patient temperature: 98.6
TCO2: 35 mmol/L (ref 0–100)
pH, Arterial: 7.409 (ref 7.350–7.450)
pO2, Arterial: 60.3 mmHg — ABNORMAL LOW (ref 80.0–100.0)

## 2015-03-30 LAB — GLUCOSE, CAPILLARY
GLUCOSE-CAPILLARY: 55 mg/dL — AB (ref 65–99)
Glucose-Capillary: 72 mg/dL (ref 65–99)

## 2015-03-30 LAB — BASIC METABOLIC PANEL
Anion gap: 7 (ref 5–15)
CALCIUM: 7.6 mg/dL — AB (ref 8.9–10.3)
CHLORIDE: 114 mmol/L — AB (ref 101–111)
CO2: 22 mmol/L (ref 22–32)
CREATININE: 0.65 mg/dL (ref 0.44–1.00)
GFR calc Af Amer: >60 mL/min (ref >60)
GFR calc non Af Amer: >60 mL/min (ref >60)
GLUCOSE: 98 mg/dL (ref 65–99)
Potassium: 3.1 mmol/L — ABNORMAL LOW (ref 3.5–5.1)
Sodium: 143 mmol/L (ref 135–145)

## 2015-03-30 LAB — CBC
HEMATOCRIT: 23.3 % — AB (ref 36.0–46.0)
HEMOGLOBIN: 7.9 g/dL — AB (ref 12.0–15.0)
MCH: 28.9 pg (ref 26.0–34.0)
MCHC: 33.9 g/dL (ref 30.0–36.0)
MCV: 85.3 fL (ref 78.0–100.0)
Platelets: 129 10*3/uL — ABNORMAL LOW (ref 150–400)
RBC: 2.73 MIL/uL — ABNORMAL LOW (ref 3.87–5.11)
RDW: 15.3 % (ref 11.5–15.5)
WBC: 10.7 10*3/uL — AB (ref 4.0–10.5)

## 2015-03-30 LAB — TRIGLYCERIDES: TRIGLYCERIDES: 123 mg/dL (ref <150)

## 2015-03-30 LAB — HEPARIN LEVEL (UNFRACTIONATED): HEPARIN UNFRACTIONATED: 0.57 [IU]/mL (ref 0.30–0.70)

## 2015-03-30 MED ORDER — POTASSIUM CHLORIDE 10 MEQ/50ML IV SOLN
10.0000 meq | INTRAVENOUS | Status: AC
  Administered 2015-03-30 (×2): 10 meq via INTRAVENOUS
  Filled 2015-03-30 (×2): qty 50

## 2015-03-30 MED ORDER — DEXTROSE 50 % IV SOLN: 1.0000 | Freq: Once | INTRAVENOUS | Status: AC

## 2015-03-30 MED ORDER — DEXTROSE-NACL 5-0.9 % IV SOLN
INTRAVENOUS | Status: AC
  Administered 2015-03-30: 21:00:00 via INTRAVENOUS

## 2015-03-30 MED ORDER — DEXTROSE 50 % IV SOLN
INTRAVENOUS | Status: AC
  Administered 2015-03-30: 50 mL
  Filled 2015-03-30: qty 50

## 2015-03-30 NOTE — Progress Notes (Signed)
No issues overnight. Pt remains intubated, sedated.  EXAM:  BP 111/71 mmHg  Pulse 80  Temp(Src) 99.2 F (37.3 C) (Axillary)  Resp 20  Ht  (1.6 m)  Wt 152.5 kg (336 lb 3.2 oz)  BMI 59.57 kg/m2  SpO2 100%  LMP 03/27/2015  Sedated, minimal response to verbal stimuli abd wound vac in place Left calf swollen  CBC    Component Value Date/Time   WBC 10.7* 03/30/2015 0338   RBC 2.73* 03/30/2015 0338   HGB 7.9* 03/30/2015 0338   HCT 23.3* 03/30/2015 0338   PLT 129* 03/30/2015 0338   MCV 85.3 03/30/2015 0338   MCH 28.9 03/30/2015 0338   MCHC 33.9 03/30/2015 0338   RDW 15.3 03/30/2015 0338   LYMPHSABS 0.8 03/27/2015 1726   MONOABS 0.8 03/27/2015 1726   EOSABS 0.0 03/27/2015 1726   BASOSABS 0.0 03/27/2015 1726    BMET    Component Value Date/Time   NA 143 03/30/2015 0926   K 3.1* 03/30/2015 0926   CL 114* 03/30/2015 0926   CO2 22 03/30/2015 0926   GLUCOSE 98 03/30/2015 0926   BUN <5* 03/30/2015 0926   CREATININE 0.65 03/30/2015 0926   CALCIUM 7.6* 03/30/2015 0926   GFRNONAA >60 03/30/2015 0926   GFRAA >60 03/30/2015 1610     IMPRESSION:  45 y.o. female s/p left iliac v repair, LCIA -> LCFA bypass. Abd remains open. Remains hemodynamically stable, H&H relatively stable, no sign of renal dysfunction.  PLAN: - Cont supportive care - Vasc surg/gen surg for abd washout, possible closure tomorrow - although lumbar spine is unstable, not of immediate concern. Once abd is closed and she is stable, able to tolerate prone position, can then instrument L5-S1.

## 2015-03-30 NOTE — Consult Note (Signed)
Reason for Consult: retroperitoneal bleed secondary to iliac injury with wash out x 2; recommendations for abdominal closure Referring Physician: Dr. Adele Barthel PCP:  No PCP Per Patient  Neurosurgery:  Dr. Consuella Lose  Kristina Estes is an 45 y.o. female.  HPI: Pt admitted for surgery on 03/27/15, by Dr. Shaune Spittle, for L5-S1 procedure; pt dropped her blood pressure and the surgery was discontinued and she was closed.  She returned to the Neuro ICU and was transfused with 2 units of PRBC and requiring pressors when seen by Dr. Bridgett Larsson.  She was hemodynamically unstable and taken back to the OR.  She underwent Exploratory laparotomy control of bleeding and left common iliac to left common femoral bypass with 8 mm Hemashield graft, 03/27/15.  In addition to this she got a Left common iliac artery to left common femoral artery bypass with 8 mm Dacron, and placement of abdominal negative pressure dressing, by Dr. Bridgett Larsson at that time also.  On 03/29/15 Dr,. Bridgett Larsson took her back to the OR for abdominal washout, and new negative pressure dressing.  She remains sedated on the vent, and Dr. Bridgett Larsson plans to take her back to the OR on Tuesday and would like some recommendations on closure of the abdominal wall.  Mesh vs component separtion.  If i am counting correctly she has had 13 units of PRBC, 11 FFP, 7 PLATELETS, AND 9 CRYRO, so far.    Past Medical History  Diagnosis Date  . Anginal pain ([place]) 2016    received referral to [facility]  . Dysrhythmia   . Asthma     as a child  . GERD (gastroesophageal reflux disease)   . Arthritis   . Obesity   . Difficult intravenous access 03/27/2015    Patient has no PIV targets, will require central line or PICC anytime general anesthesia is needed    Past Surgical History  Procedure Laterality Date  . Ectopic pregnancy surgery  [place], [place]   . Carpal tunnel release Bilateral 2004    [facility]  . Cholecystectomy  2014     [place] in [place]  . Diagnostic laparoscopy  2014  . Repair knee ligament Right 2014    [place], [place]  . Tonsillectomy    . Tonsillectomy and adenoidectomy  1998  . Fracture surgery      left foot   . Wisdom tooth extraction    . Colonoscopy      History reviewed. No pertinent family history.  Social History:  reports that she has never smoked. She does not have any smokeless tobacco history on file. She reports that she does not drink alcohol or use illicit drugs.  Allergies:  Allergies  Allergen Reactions  . Propoxyphene Other (See Comments)    Medications:  Prior to Admission:  Prescriptions prior to admission  Medication Sig Dispense Refill Last Dose  . aspirin-sod bicarb-citric acid (ALKA-SELTZER) 325 MG TBEF tablet Take 325 mg by mouth every 6 (six) hours as needed (heartburn).   Past Week at Unknown time  . cyclobenzaprine (FLEXERIL) 10 MG tablet Take 10 mg by mouth 3 (three) times daily as needed for muscle spasms.   Past Week at Unknown time  . gabapentin (NEURONTIN) 300 MG capsule Take 300 mg by mouth 3 (three) times daily.   Past Week at Unknown time  . meloxicam (MOBIC) 15 MG tablet Take 15 mg by mouth daily.   Past Week at Unknown time  . oxybutynin (  DITROPAN-XL) 5 MG 24 hr tablet Take 10 mg by mouth at bedtime.  3 Past Week at Unknown time  . phentermine (ADIPEX-P) 37.5 MG tablet Take 37.5 mg by mouth daily before breakfast. Reported on 03/18/2015   More than a month at Unknown time   Scheduled: . antiseptic oral rinse  7 mL Mouth Rinse QID  . chlorhexidine gluconate  15 mL Mouth Rinse BID  . docusate sodium  100 mg Oral Daily  . fentaNYL (SUBLIMAZE) injection  50 mcg Intravenous Once  . furosemide  20 mg Intravenous Q12H  . pantoprazole (PROTONIX) IV  40 mg Intravenous QHS  . potassium chloride  10 mEq Intravenous Q1 Hr x 2  . sodium chloride flush  3 mL Intravenous Q12H   Continuous: . sodium chloride 50 mL/hr at 03/30/15 0700  . sodium chloride    .  fentaNYL infusion INTRAVENOUS 150 mcg/hr (03/30/15 0700)  . heparin 16 Units/kg/hr (03/30/15 0700)  . propofol (DIPRIVAN) infusion 35 mcg/kg/min (03/30/15 0951)   SAY:TKZSWFUXNATFT **OR** acetaminophen, alum & mag hydroxide-simeth, fentaNYL, guaiFENesin-dextromethorphan, hydrALAZINE, labetalol, methocarbamol **OR** methocarbamol (ROBAXIN)  IV, metoprolol, ondansetron (ZOFRAN) IV, phenol, potassium chloride Anti-infectives    Start     Dose/Rate Route Frequency Ordered Stop   03/27/15 2130  cefUROXime (ZINACEF) 1.5 g in dextrose 5 % 50 mL IVPB  Status:  Discontinued     1.5 g 100 mL/hr over 30 Minutes Intravenous Every 12 hours 03/27/15 2124 03/27/15 2132   03/27/15 1600  ceFAZolin (ANCEF) IVPB 2 g/50 mL premix     2 g 100 mL/hr over 30 Minutes Intravenous Every 8 hours 03/27/15 1322 03/28/15 0759   03/27/15 1447  ceFAZolin (ANCEF) 1-5 GM-% IVPB    Comments:  Claybon Jabs   : cabinet override      03/27/15 1447 03/28/15 0259   03/27/15 0800  bacitracin 50,000 Units in sodium chloride irrigation 0.9 % 500 mL irrigation  Status:  Discontinued       As needed 03/27/15 0943 03/27/15 1223   03/27/15 0700  ceFAZolin (ANCEF) 3 g in dextrose 5 % 50 mL IVPB     3 g 130 mL/hr over 30 Minutes Intravenous To [facility] 03/26/15 1409 03/27/15 0750      Results for orders placed or performed during the hospital encounter of 03/27/15 (from the past 48 hour(s))  Magnesium     Status: Abnormal   Collection Time: 03/28/15 11:45 AM  Result Value Ref Range   Magnesium 1.6 (L) 1.7 - 2.4 mg/dL  CK     Status: Abnormal   Collection Time: 03/28/15  6:05 PM  Result Value Ref Range   Total CK 359 (H) 38 - 234 U/L  Glucose, capillary     Status: None   Collection Time: 03/29/15 12:13 AM  Result Value Ref Range   Glucose-Capillary 69 65 - 99 mg/dL   Comment 1 Capillary Specimen    Comment 2 Notify RN   Glucose, capillary     Status: Abnormal   Collection Time: 03/29/15 12:16 AM  Result Value Ref  Range   Glucose-Capillary 115 (H) 65 - 99 mg/dL   Comment 1 Arterial Specimen    Comment 2 Notify RN   I-STAT 3, arterial blood gas (G3+)     Status: Abnormal   Collection Time: 03/29/15  3:34 AM  Result Value Ref Range   pH, Arterial 7.424 7.350 - 7.450   pCO2 arterial 34.7 (L) 35.0 - 45.0 mmHg   pO2, Arterial 161.0 (  H) 80.0 - 100.0 mmHg   Bicarbonate 22.7 20.0 - 24.0 mEq/L   TCO2 24 0 - 100 mmol/L   O2 Saturation 100.0 %   Acid-base deficit 1.0 0.0 - 2.0 mmol/L   Patient temperature 98.5 F    Collection site ARTERIAL LINE    Sample type ARTERIAL   CK     Status: Abnormal   Collection Time: 03/29/15  4:07 AM  Result Value Ref Range   Total CK 315 (H) 38 - 234 U/L  CBC     Status: Abnormal   Collection Time: 03/29/15  4:07 AM  Result Value Ref Range   WBC 8.8 4.0 - 10.5 K/uL   RBC 2.83 (L) 3.87 - 5.11 MIL/uL   Hemoglobin 8.7 (L) 12.0 - 15.0 g/dL   HCT 24.4 (L) 36.0 - 46.0 %   MCV 86.2 78.0 - 100.0 fL   MCH 30.7 26.0 - 34.0 pg   MCHC 35.7 30.0 - 36.0 g/dL   RDW 15.1 11.5 - 15.5 %   Platelets 113 (L) 150 - 400 K/uL    Comment: CONSISTENT WITH PREVIOUS RESULT  Basic metabolic panel     Status: Abnormal   Collection Time: 03/29/15  4:07 AM  Result Value Ref Range   Sodium 144 135 - 145 mmol/L   Potassium 3.1 (L) 3.5 - 5.1 mmol/L   Chloride 114 (H) 101 - 111 mmol/L   CO2 23 22 - 32 mmol/L   Glucose, Bld 123 (H) 65 - 99 mg/dL   BUN 5 (L) 6 - 20 mg/dL   Creatinine, Ser 0.62 0.44 - 1.00 mg/dL   Calcium 7.5 (L) 8.9 - 10.3 mg/dL   GFR calc non Af Amer >60 >60 mL/min   GFR calc Af Amer >60 >60 mL/min    Comment: (NOTE) The eGFR has been calculated using the CKD EPI equation. This calculation has not been validated in all clinical situations. eGFR's persistently <60 mL/min signify possible Chronic Kidney Disease.    Anion gap 7 5 - 15  Magnesium     Status: None   Collection Time: 03/29/15  4:07 AM  Result Value Ref Range   Magnesium 2.0 1.7 - 2.4 mg/dL  Heparin level  (unfractionated)     Status: None   Collection Time: 03/29/15  7:36 PM  Result Value Ref Range   Heparin Unfractionated 0.34 0.30 - 0.70 IU/mL    Comment:        IF HEPARIN RESULTS ARE BELOW EXPECTED VALUES, AND PATIENT DOSAGE HAS BEEN CONFIRMED, SUGGEST FOLLOW UP TESTING OF ANTITHROMBIN III LEVELS.   CBC     Status: Abnormal   Collection Time: 03/29/15  7:37 PM  Result Value Ref Range   WBC 10.9 (H) 4.0 - 10.5 K/uL   RBC 2.92 (L) 3.87 - 5.11 MIL/uL   Hemoglobin 8.4 (L) 12.0 - 15.0 g/dL   HCT 24.9 (L) 36.0 - 46.0 %   MCV 85.3 78.0 - 100.0 fL   MCH 28.8 26.0 - 34.0 pg   MCHC 33.7 30.0 - 36.0 g/dL   RDW 15.2 11.5 - 15.5 %   Platelets 126 (L) 150 - 400 K/uL  I-STAT 3, arterial blood gas (G3+)     Status: Abnormal   Collection Time: 03/30/15  3:24 AM  Result Value Ref Range   pH, Arterial 7.455 (H) 7.350 - 7.450   pCO2 arterial 31.3 (L) 35.0 - 45.0 mmHg   pO2, Arterial 133.0 (H) 80.0 - 100.0 mmHg   Bicarbonate 22.0  20.0 - 24.0 mEq/L   TCO2 23 0 - 100 mmol/L   O2 Saturation 99.0 %   Acid-base deficit 2.0 0.0 - 2.0 mmol/L   Patient temperature 98.5 F    Collection site ARTERIAL LINE    Sample type ARTERIAL   Triglycerides     Status: None   Collection Time: 03/30/15  3:38 AM  Result Value Ref Range   Triglycerides 123 <150 mg/dL  CBC     Status: Abnormal   Collection Time: 03/30/15  3:38 AM  Result Value Ref Range   WBC 10.7 (H) 4.0 - 10.5 K/uL   RBC 2.73 (L) 3.87 - 5.11 MIL/uL   Hemoglobin 7.9 (L) 12.0 - 15.0 g/dL   HCT 23.3 (L) 36.0 - 46.0 %   MCV 85.3 78.0 - 100.0 fL   MCH 28.9 26.0 - 34.0 pg   MCHC 33.9 30.0 - 36.0 g/dL   RDW 15.3 11.5 - 15.5 %   Platelets 129 (L) 150 - 400 K/uL  Heparin level (unfractionated)     Status: None   Collection Time: 03/30/15  3:39 AM  Result Value Ref Range   Heparin Unfractionated 0.57 0.30 - 0.70 IU/mL    Comment:        IF HEPARIN RESULTS ARE BELOW EXPECTED VALUES, AND PATIENT DOSAGE HAS BEEN CONFIRMED, SUGGEST FOLLOW UP  TESTING OF ANTITHROMBIN III LEVELS.   Basic metabolic panel     Status: Abnormal   Collection Time: 03/30/15  9:26 AM  Result Value Ref Range   Sodium 143 135 - 145 mmol/L   Potassium 3.1 (L) 3.5 - 5.1 mmol/L   Chloride 114 (H) 101 - 111 mmol/L   CO2 22 22 - 32 mmol/L   Glucose, Bld 98 65 - 99 mg/dL   BUN <5 (L) 6 - 20 mg/dL   Creatinine, Ser 0.65 0.44 - 1.00 mg/dL   Calcium 7.6 (L) 8.9 - 10.3 mg/dL   GFR calc non Af Amer >60 >60 mL/min   GFR calc Af Amer >60 >60 mL/min    Comment: (NOTE) The eGFR has been calculated using the CKD EPI equation. This calculation has not been validated in all clinical situations. eGFR's persistently <60 mL/min signify possible Chronic Kidney Disease.    Anion gap 7 5 - 15    No results found.  Review of Systems  Unable to perform ROS: intubated   Blood pressure 111/71, pulse 80, temperature 99.2 F (37.3 C), temperature source Axillary, resp. rate 20, height 5' 3" (1.6 m), weight 152.5 kg (336 lb 3.2 oz), last menstrual period 03/27/2015, SpO2 100 %. Physical Exam  Constitutional: She is oriented to person, place, and time. She appears well-developed and well-nourished. No distress.  Sedated and on Vent. She will open her eyes some with exam.  HENT:  Head: Normocephalic and atraumatic.  Intubated, NG in place.  Eyes: Conjunctivae and EOM are normal. Right eye exhibits no discharge. Left eye exhibits no discharge. No scleral icterus.  Neck: Normal range of motion. Neck supple. No JVD present. No thyromegaly present.  Cardiovascular: Regular rhythm, normal heart sounds and intact distal pulses.   No murmur heard. Respiratory: Effort normal. No respiratory distress. She has no wheezes. She has no rales. She exhibits no tenderness.  On full Vent support  GI: She exhibits distension. She exhibits no mass. There is tenderness. There is no rebound and no guarding.  Open abdominal wound vac in place.  No BS.  Musculoskeletal: She exhibits edema.   Lymphadenopathy:  She has no cervical adenopathy.  Neurological: She is alert and oriented to person, place, and time. No cranial nerve deficit.  She is sedated but opens her eyes some with speaking and exam.  Skin: Skin is warm and dry. No rash noted. She is not diaphoretic. No erythema. No pallor.  Psychiatric: She has a normal mood and affect. Her behavior is normal. Judgment and thought content normal.   . sodium chloride 50 mL/hr at 03/30/15 0700  . sodium chloride    . fentaNYL infusion INTRAVENOUS 150 mcg/hr (03/30/15 0700)  . heparin 16 Units/kg/hr (03/30/15 0700)  . propofol (DIPRIVAN) infusion 35 mcg/kg/min (03/30/15 0951)    Assessment/Plan: Back pain with bleeding injury during surgery, surgery aborted. S/p Exploratory laparotomy control of bleeding and left common iliac to left common femoral bypass with 8 mm Hemashield graft. -DR Early, and Left common iliac artery to left common femoral artery bypass with 8 mm Dacron, and placement of abdominal negative pressure dressing, by Dr. Bridgett Larsson at that time also. On 03/29/15 Dr,. [facility] out Dr. Bridgett Larsson 03/29/15. Hx of angina/dysrythmia GERD Obesity, Body mass index is 59.5 Arthritis Hx of asthma Hypokalemia] Fluid overload   Antibiotics:  Pre op only DVT:  Heparin drip  Plan:  I/O shows her 21 liters positive in her fluid balance, her BP is in the 100 range this AM.  K+ is low and H/H  Is also low.  She may require more PRBC's   i will discuss with Dr. Grandville Silos and he will review with Dr. Bridgett Larsson  We may have to wait to close after she has lost some of her extra fluid volume.     JENNINGS,WILLARD 03/30/2015, 10:57 AM

## 2015-03-30 NOTE — Progress Notes (Addendum)
Hypoglycemic Event  CBG: 64  Treatment: D50 IV 25 mL  Symptoms: None  Follow-up CBG: Time: 2050 CBG Result: 72  Possible Reasons for Event: Inadequate meal intake  Comments/MD notified: Elink notified. IV fluids changed    Derek Mound

## 2015-03-30 NOTE — Progress Notes (Signed)
PULMONARY / CRITICAL CARE MEDICINE   Name: Kristina Estes MRN: [id number] DOB: July 29, 1970    ADMISSION DATE:  03/27/2015 CONSULTATION DATE:  03/27/15  REFERRING MD:  Conchita Paris  CHIEF COMPLAINT:  Hemorrhagic shock  HISTORY OF PRESENT ILLNESS:   Kristina Estes is a 45 year old with history of asthma, arthritis, obesity. She had an elective admission today for L5 laminectomy. Intra-Op she went into shock, hemodynamic instability. Subsequent CT showed large left retroperitoneal hematoma. She was put on 2 pressors (norepi, epinephrine) and transferred to ICU. PCCM consulted for help with management. Vascular surgery was also consulted, underwent repair of L CIV with bypass to CFA. Intra OP TEE showed normal cardiac function.   SUBJECTIVE:  Pt is sedated, comfortably laying. Limited info as pt is sedated.   VITAL SIGNS: BP 111/71 mmHg  Pulse 80  Temp(Src) 99.2 F (37.3 C) (Axillary)  Resp 20  Ht  (1.6 m)  Wt 336 lb 3.2 oz (152.5 kg)  BMI 59.57 kg/m2  SpO2 100%  LMP 03/27/2015  HEMODYNAMICS:    VENTILATOR SETTINGS: Vent Mode:  [-] PRVC FiO2 (%):  [30 %] 30 % Set Rate:  [20 bmp] 20 bmp Vt Set:  [450 mL] 450 mL PEEP:  [5 cmH20] 5 cmH20 Plateau Pressure:  [21 cmH20-24 cmH20] 24 cmH20  INTAKE / OUTPUT: I/O last 3 completed shifts: In: 5518.3 [I.V.:5248.3; NG/GT:270] Out: 5195 [Urine:3575; Emesis/NG output:300; Drains:1220; Blood:100]  Pt is 20L (+) since admission  PHYSICAL EXAMINATION: General:  Sedated, comfortably laying in bed Neuro:  CN grossly intact. Pt is sedated so limited exam.  HEENT:  PERLA, (+) corneals; NV difficult to assess; (+) ET tube; (-) LAD Cardiovascular:  Good si/s2; (-) s3, m,r,g Lungs:  Dec BS BLS. Bibasilar crackles. (-) wheeze. (-) accessory muscle use Abdomen:  Dec BS. (+) open abd. (+) surgical dressing Musculoskeletal:  Gr 2 edema.  Skin:  Skin was warm and dry  LABS:  BMET  Recent Labs Lab 03/28/15 0547 03/29/15 0407  03/30/15 0926  NA 144 144 143  K 3.6 3.1* 3.1*  CL 113* 114* 114*  CO2 BUN 10 5* <5*  CREATININE 0.91 0.62 0.65  GLUCOSE 114* 123* 98    Electrolytes  Recent Labs Lab 03/27/15 2205 03/28/15 0547 03/28/15 1145 03/29/15 0407 03/30/15 0926  CALCIUM 7.5* 7.7*  --  7.5* 7.6*  MG 1.2*  --  1.6* 2.0  --     CBC  Recent Labs Lab 03/29/15 0407 03/29/15 1937 03/30/15 0338  WBC 8.8 10.9* 10.7*  HGB 8.7* 8.4* 7.9*  HCT 24.4* 24.9* 23.3*  PLT 113* 126* 129*    Coag's  Recent Labs Lab 03/27/15 2205 03/27/15 2206 03/28/15 0547  APTT 37 36 30  INR 1.32 1.31 1.23    Sepsis Markers  Recent Labs Lab 03/27/15 1210  LATICACIDVEN 3.9*    ABG  Recent Labs Lab 03/28/15 0743 03/29/15 0334 03/30/15 0324  PHART 7.325* 7.424 7.455*  PCO2ART 41.4 34.7* 31.3*  PO2ART 149.0* 161.0* 133.0*    Liver Enzymes  Recent Labs Lab 03/27/15 1210 03/27/15 2205 03/28/15 0547  AST 17 32 39  ALT 11* 20 20  ALKPHOS 40 41 44  BILITOT 0.6 2.7* 1.5*  ALBUMIN 1.8* 3.0* 2.9*    Cardiac Enzymes  Recent Labs Lab 03/27/15 1210  TROPONINI 0.11*    Glucose  Recent Labs Lab 03/27/15 2117 03/29/15 0013 03/29/15 0016  GLUCAP 162* 69 115*    Imaging No results found.  Ct Abdomen  Pelvis Wo Contrast  03/27/2015 CLINICAL DATA: 45 year old who became acutely hypotensive intraoperatively during L5-S1 posterior decompression and discectomy earlier today. EXAM: CT ABDOMEN AND PELVIS WITHOUT CONTRAST TECHNIQUE: Multidetector CT imaging of the abdomen and pelvis was performed following the standard protocol without IV contrast. COMPARISON: None. FINDINGS: Lower chest: Blurred by respiratory motion. Mild dependent atelectasis posteriorly in the lower lobes. Visualized lung bases otherwise clear. Normal heart size. Hepatobiliary: Beam hardening streak artifact as the patient was unable to raise the arms. Allowing for this, normal unenhanced appearance of the liver.  Gallbladder surgically absent. No unexpected biliary ductal dilation. Pancreas: Normal unenhanced appearance. Spleen: Normal unenhanced appearance. Adrenals/Urinary Tract: Normal appearing adrenal glands. Unenhanced appearance of the right kidney. Acute perinephric and subcapsular hematoma involving the left kidney. No visible focal parenchymal abnormality involving the left kidney, allowing for the unenhanced technique. Urinary bladder decompressed by Foley catheter. Stomach/Bowel: Stomach normal in appearance for the degree of distention. Normal-appearing small bowel. Normal-appearing colon with expected stool burden. Normal appendix in the right upper pelvis. Vascular/Lymphatic: No visible aortoiliofemoral atherosclerosis. No pathologic lymphadenopathy. Reproductive: Normal-appearing uterus and ovaries without evidence of adnexal mass. Other: Large retroperitoneal hematoma involving predominantly the left side of the abdomen and pelvis, extending from the level of the upper pole of the left kidney inferiorly to the low pelvis, maximum measurements approximating 7.4 x 9.9 x 17.0 cm. The largest portion of the hematoma is anterior to the left psoas muscle. There is a smaller component of the retroperitoneal hematoma in the right side of the abdomen. The retroperitoneal hematoma displaces the left kidney anteriorly. A small amount of intraperitoneal hemorrhage is present in the paracolic gutters, left greater than right. Musculoskeletal: Postsurgical changes related to L5-S1 discectomy and posterior decompression at L5. Gas bubbles in the surgical tract, the epidural space and the L5-S1 disc. No abnormal fluid collection to suggest abscess or leak. IMPRESSION: 1. Large retroperitoneal hematoma involving predominantly the left side of the abdomen and pelvis, maximum measurements given above. 2. Subcapsular left renal hematoma and left perinephric hematoma, contiguous with the retroperitoneal hematoma. 3. Small amount  of intraperitoneal hemorrhage in the paracolic gutters, left greater than right. 4. Expected postsurgical changes at L5-S1. Given the fact that the patient has a subcapsular left renal hematoma and a left perinephric hematoma, the possible etiology of the retroperitoneal hematoma may be due to a lesion in the left kidney. Repeat CT of the abdomen and pelvis with contrast may be helpful once the patient has recovered from surgery to exclude that possibility. These results were discussed directly with Dr. Conchita Paris at the time of interpretation 03/27/2015 at 1 o'clock p.m. Electronically Signed By: Hulan Saas M.D. On: 03/27/2015 13:27   US Abdomen Limited  03/27/2015 CLINICAL DATA: Intraoperative exam to assess for abdominal/pelvic fluid/ hematoma. CT scan after this ultrasound reveals large retroperitoneal hematoma left-greater-than-right as well as left perinephric and subscapular hematoma of which Dr.Nundkumar is aware. EXAM: LIMITED ABDOMINAL ULTRASOUND COMPARISON: CT 03/27/2015 FINDINGS: Limited all intraoperative ultrasound evaluation of the bilateral lower quadrants demonstrates no definite focal free fluid collection. IMPRESSION: No definite focal fluid collection within the lower quadrants. Electronically Signed By: Elberta Fortis M.D. On: 03/27/2015 14:13   Portable Chest Xray  03/28/2015 CLINICAL DATA: Intubation EXAM: PORTABLE CHEST 1 VIEW COMPARISON: Yesterday FINDINGS: Tubular devices are stable. Cardiomegaly is stable. Hazy opacity throughout the left hemi thorax has increased which may represent hazy airspace disease or layering pleural fluid. Vascularity is unremarkable. IMPRESSION: Increasing AC opacity throughout the left hemi  thorax as described representing either hazy airspace disease or layering pleural fluid. Electronically Signed By: Jolaine Click M.D. On: 03/28/2015 08:56   Dg Chest Port 1 View  03/27/2015 CLINICAL DATA: Open wound in the anterior abdomen.  Intubated. EXAM: PORTABLE CHEST 1 VIEW COMPARISON: None. FINDINGS: Endotracheal tube tip is 2.3 cm above the carina. Enteric tube enters the stomach, with the tip not seen on this image. Right internal jugular central venous catheter terminates in the middle third of the superior vena cava. Left internal jugular central venous sheath is noted with the tip overlying the left superior mediastinum, probably within the lower left internal jugular vein or left brachiocephalic vein. Top-normal heart size and normal mediastinal contour accounting for low lung volumes. No pneumothorax. No pleural effusion. Prominence of the interstitial markings probably represents vascular crowding due to low lung volumes. There is left lower lobe opacity. IMPRESSION: 1. Well-positioned support hardware as described. 2. Low lung volumes. Left lower lobe opacity likely represents atelectasis. Electronically Signed By: Delbert Phenix M.D. On: 03/27/2015 22:20   Dg Abd Portable 1v  03/27/2015 CLINICAL DATA: 45 year old female status post -abdominal surgery. Incorrect surgical device count EXAM: PORTABLE ABDOMEN - 1 VIEW COMPARISON: CT dated 03/27/2015 FINDINGS: Evaluation is limited due to soft tissue attenuation by body habitus as well as portable technique. Support lines noted overlying the left upper abdomen. Right upper quadrant cholecystectomy clips seen. Multiple cutaneous surgical clips noted in the lower abdomen to the right of the spine. No other radiopaque foreign object identified. There is no evidence of bowel obstruction. No radiopaque calculi. The osseous structures are grossly unremarkable. IMPRESSION: No definite evidence of radiopaque surgical foreign object These results were called by telephone at the time of interpretation on 03/27/2015 at 8:08 pm to Nurse Julien Girt, who verbally acknowledged these results. Electronically Signed By: Elgie Collard M.D. On: 03/27/2015 20:11   STUDIES:  CT abd 2/17 >> Large  RP hematoma with renal, perinephric hematoma Intra op TEE 2/17 >> Normal cardiac function by verbal report.   CULTURES:   ANTIBIOTICS: Cefazolin 2/17 >>  SIGNIFICANT EVENTS: 2/17 - Admit for L5 laminectomy >> hemorrhagic shock from RP bleed.   LINES/TUBES: Rt IJ 2/17 >>  DISCUSSION: 45 y/o with hemorraghic shock from large RP hematoma that occurred during operation for L5 laminectomy, s/p L CIV repair  ASSESSMENT / PLAN:  PULMONARY A: Acute Hypoxemic Hypercapneic Respiratory Failure 2 to Hemorrhagic Shock, Unable to Protect the Airway, Pulmonary Edema, RVD 2 to Morbid Obesity. History of asthma -- seems stable.  Possible OSA P:   --cont vent support for now. On 30% Fio2. Still have active Abd issues. --start weaning once surgery is done. Plan for OR in am.  --cont diuresis.  CARDIOVASCULAR A:  S/P Hemorrhagic Shock. Off pressors. Hb has been stable. Pulm edema P:  --cont diuresis  RENAL A:   Has Volume Overload P:   --cont diresis --replace K  GASTROINTESTINAL A:   No issues.  P:   --Cont NPO --PUD prophylaxis  HEMATOLOGIC A:   S/P Hemorrhagic Shock with large RP bleed. LUE DVT P:  -- Hb and hct stable last 24 hrs. No blood transfusion last 24 hrs.  --cont heparin drip --plan for surgery in am. Potentially abd will be closed.   INFECTIOUS A:   No active infection P:   -- on Cefazolin for op prophylaxis.   ENDOCRINE A:   No issues P:   --keep BS < 180 mg% --on SSI  NEUROLOGIC A:  Post op pain P:   --RASS goal: -1 --On propofol drip. --Check triglycerides daily (2/20 was 123)   FAMILY  - Updates: No family around   - Inter-disciplinary family meet or Palliative Care meeting due by: 2/24  Critical care time spent on this patient today : 33 minutes.    Lance Sell A. Christene Slates, MD Pulmonary and Critical Care Medicine [facility] Pager: [phone] After 3 pm or if no response, call [phone]  03/30/2015, 10:20  AM

## 2015-03-30 NOTE — Progress Notes (Signed)
ANTICOAGULATION CONSULT NOTE - Follow Up Consult  Pharmacy Consult for Heparin  Indication: DVT  Allergies  Allergen Reactions  . Propoxyphene Other (See Comments)    Patient Measurements: Height:  (160 cm) Weight: (!) 335 lb 1.6 oz (152 kg) IBW/kg (Calculated) : 52.4  Vital Signs: Temp: 98.5 F (36.9 C) (02/20 0000) Temp Source: Oral (02/20 0000) BP: 85/37 mmHg (02/20 0400) Pulse Rate: 80 (02/20 0000)  Labs:  Recent Labs  03/27/15 1210  03/27/15 2205 03/27/15 2206  03/28/15 0547 03/28/15 0845 03/28/15 1805 03/29/15 0407 03/29/15 1936 03/29/15 1937 03/30/15 0338 03/30/15 0339  HGB 7.4*  < > 9.2*  --   < >  --   --   --  8.7*  --  8.4* 7.9*  --   HCT 22.6*  < > 27.0*  --   < >  --   --   --  24.4*  --  24.9* 23.3*  --   PLT 259  < > 108* 111*  < > 119*  --   --  113*  --  126* 129*  --   APTT 28  < > 37 36  --  30  --   --   --   --   --   --   --   LABPROT 18.0*  < > 16.5* 16.4*  --  15.6*  --   --   --   --   --   --   --   INR 1.48  < > 1.32 1.31  --  1.23  --   --   --   --   --   --   --   HEPARINUNFRC  --   --   --   --   --   --   --   --   --  0.34  --   --  0.57  CREATININE 0.94  --  0.77  --   --  0.91  --   --  0.62  --   --   --   --   CKTOTAL 85  --   --   --   --   --  374* 359* 315*  --   --   --   --   CKMB 4.0  --   --   --   --   --   --   --   --   --   --   --   --   TROPONINI 0.11*  --   --   --   --   --   --   --   --   --   --   --   --   < > = values in this interval not displayed.  Estimated Creatinine Clearance: 129.3 mL/min (by C-G formula based on Cr of 0.62).  Assessment: Heparin level therapeutic x 2  Goal of Therapy:  Heparin level 0.3-0.7 units/ml Monitor platelets by anticoagulation protocol: Yes   Plan:  -Cont heparin 1500 units/hr -Daily CBC/HL -Monitor for bleeding -F/U plans for oral AC  Abran Duke 03/30/2015,4:17 AM

## 2015-03-30 NOTE — Progress Notes (Signed)
eLink Physician-Brief Progress Note Patient Name: Kristina Estes DOB: 02/14/1970 MRN: [id number]   Date of Service  03/30/2015  HPI/Events of Note  Hypoglycemia - Blood glucose = 60 >> given 0.5 amp D50 >> blood glucose = 72.  eICU Interventions  Will change IV fluid to D5 0.9 NaCl to run IV at 50 mL/hour.      Intervention Category Major Interventions: Other:  Kristina Estes 03/30/2015, 9:20 PM

## 2015-03-30 NOTE — Progress Notes (Signed)
   Daily Progress Note  Assessment/Planning: POD #3 s/p X-lap, L CIV repair, L distal CIA ligation, L CIA to CFA bypass   Return to OR tomorrow for washout and possible closure (doubt it will be possible)  Will get General Surgery input in regards to abdominal closure  Continue Lasix to try to drive diuresis  Continue heparin drip until abdomen closed then will switch to Lovenox   Subjective  - 1 Day Post-Op  No events overnight  Objective Filed Vitals:   03/30/15 0400 03/30/15 0500 03/30/15 0600 03/30/15 0700  BP: 97/58  Pulse:      Temp: 99 F (37.2 C)     TempSrc: Oral     Resp: 20 13 0 0  Height:      Weight:   336 lb 3.2 oz (152.5 kg)   SpO2:      A-line: 110-140s/40-50s   Intake/Output Summary (Last 24 hours) at 03/30/15 0757 Last data filed at 03/30/15 0700  Gross per 24 hour  Intake 3804.73 ml  Output   3820 ml  Net -15.27 ml   NEURO sedated on propofol and fentanyl PULM  On vent for open abd Vent Mode:  [-] PRVC FiO2 (%):  [30 %] 30 % Set Rate:  [20 bmp] 20 bmp Vt Set:  [450 mL] 450 mL PEEP:  [5 cmH20] 5 cmH20 Plateau Pressure:  [21 cmH20-24 cmH20] 24 cmH20  CV  RRR, stable HD, A-line pressures normal GI  soft, open abd, VAC adherent, 870 cc REN UOP 2.8 L VASC  L calf swollen but soft, dopplerable DP  Laboratory CBC    Component Value Date/Time   WBC 10.7* 03/30/2015 0338   HGB 7.9* 03/30/2015 0338   HCT 23.3* 03/30/2015 0338   PLT 129* 03/30/2015 0338    BMET    Component Value Date/Time   NA 144 03/29/2015 0407   K 3.1* 03/29/2015 0407   CL 114* 03/29/2015 0407   CO2 23 03/29/2015 0407   GLUCOSE 123* 03/29/2015 0407   BUN 5* 03/29/2015 0407   CREATININE 0.62 03/29/2015 0407   CALCIUM 7.5* 03/29/2015 0407   GFRNONAA >60 03/29/2015 0407   GFRAA >60 03/29/2015 0407    Kristina Sake, MD [facility] Office: [phone] Pager: [phone]  03/30/2015, 7:57 AM

## 2015-03-31 ENCOUNTER — Inpatient Hospital Stay (HOSPITAL_COMMUNITY): Payer: PRIVATE HEALTH INSURANCE | Admitting: Certified Registered Nurse Anesthetist

## 2015-03-31 ENCOUNTER — Encounter (HOSPITAL_COMMUNITY)
Admission: RE | Disposition: A | Discharge status: Skilled Nursing Facility | Payer: Self-pay | Source: Ambulatory Visit | Attending: Neurosurgery

## 2015-03-31 DIAGNOSIS — E8770 Fluid overload, unspecified: Secondary | ICD-10-CM | POA: Insufficient documentation

## 2015-03-31 DIAGNOSIS — J9602 Acute respiratory failure with hypercapnia: Secondary | ICD-10-CM | POA: Insufficient documentation

## 2015-03-31 HISTORY — PX: APPLICATION OF WOUND VAC: SHX5189

## 2015-03-31 HISTORY — PX: ABDOMINAL WOUND DEHISCENCE: SHX540

## 2015-03-31 LAB — BASIC METABOLIC PANEL
ANION GAP: 8 (ref 5–15)
BUN: 7 mg/dL (ref 6–20)
CHLORIDE: 111 mmol/L (ref 101–111)
CO2: 22 mmol/L (ref 22–32)
Calcium: 8 mg/dL — ABNORMAL LOW (ref 8.9–10.3)
Creatinine, Ser: 0.64 mg/dL (ref 0.44–1.00)
GFR calc non Af Amer: >60 mL/min (ref >60)
GLUCOSE: 100 mg/dL — AB (ref 65–99)
Potassium: 2.9 mmol/L — ABNORMAL LOW (ref 3.5–5.1)
Sodium: 141 mmol/L (ref 135–145)

## 2015-03-31 LAB — TYPE AND SCREEN
ABO/RH(D): O POS
ABO/RH(D): O POS
ANTIBODY SCREEN: NEGATIVE
ANTIBODY SCREEN: NEGATIVE
UNIT DIVISION: 0
UNIT DIVISION: 0
UNIT DIVISION: 0
UNIT DIVISION: 0
UNIT DIVISION: 0
UNIT DIVISION: 0
UNIT DIVISION: 0
UNIT DIVISION: 0
UNIT DIVISION: 0
UNIT DIVISION: 0
UNIT DIVISION: 0
Unit division: 0
Unit division: 0
Unit division: 0
Unit division: 0
Unit division: 0
Unit division: 0
Unit division: 0
Unit division: 0

## 2015-03-31 LAB — GLUCOSE, CAPILLARY
GLUCOSE-CAPILLARY: 103 mg/dL — AB (ref 65–99)
GLUCOSE-CAPILLARY: 53 mg/dL — AB (ref 65–99)
GLUCOSE-CAPILLARY: 70 mg/dL (ref 65–99)
GLUCOSE-CAPILLARY: 84 mg/dL (ref 65–99)
Glucose-Capillary: 72 mg/dL (ref 65–99)
Glucose-Capillary: 81 mg/dL (ref 65–99)

## 2015-03-31 LAB — HEPARIN LEVEL (UNFRACTIONATED): HEPARIN UNFRACTIONATED: 0.39 [IU]/mL (ref 0.30–0.70)

## 2015-03-31 LAB — CBC
HCT: 23.5 % — ABNORMAL LOW (ref 36.0–46.0)
HEMOGLOBIN: 7.8 g/dL — AB (ref 12.0–15.0)
MCH: 28.9 pg (ref 26.0–34.0)
MCHC: 33.2 g/dL (ref 30.0–36.0)
MCV: 87 fL (ref 78.0–100.0)
PLATELETS: 158 10*3/uL (ref 150–400)
RBC: 2.7 MIL/uL — AB (ref 3.87–5.11)
RDW: 15.3 % (ref 11.5–15.5)
WBC: 10.2 10*3/uL (ref 4.0–10.5)

## 2015-03-31 LAB — MAGNESIUM: MAGNESIUM: 1.8 mg/dL (ref 1.7–2.4)

## 2015-03-31 LAB — PHOSPHORUS: PHOSPHORUS: 2.2 mg/dL — AB (ref 2.5–4.6)

## 2015-03-31 LAB — MRSA CULTURE: CULTURE: NORMAL

## 2015-03-31 LAB — POTASSIUM
Potassium: 2.9 mmol/L — ABNORMAL LOW (ref 3.5–5.1)
Potassium: 3.8 mmol/L (ref 3.5–5.1)

## 2015-03-31 SURGERY — REPAIR, DEHISCENCE, WOUND, ABDOMEN
Anesthesia: General

## 2015-03-31 MED ORDER — MAGNESIUM SULFATE IN D5W 10-5 MG/ML-% IV SOLN
1.0000 g | Freq: Once | INTRAVENOUS | Status: AC
  Administered 2015-03-31: 1 g via INTRAVENOUS
  Filled 2015-03-31: qty 100

## 2015-03-31 MED ORDER — PROPOFOL 10 MG/ML IV BOLUS
INTRAVENOUS | Status: DC | PRN
  Administered 2015-03-31 (×3): 20 mg via INTRAVENOUS

## 2015-03-31 MED ORDER — FENTANYL CITRATE (PF) 250 MCG/5ML IJ SOLN
INTRAMUSCULAR | Status: AC
  Filled 2015-03-31: qty 5

## 2015-03-31 MED ORDER — ROCURONIUM BROMIDE 100 MG/10ML IV SOLN
INTRAVENOUS | Status: DC | PRN
  Administered 2015-03-31: 50 mg via INTRAVENOUS

## 2015-03-31 MED ORDER — PHENYLEPHRINE 40 MCG/ML (10ML) SYRINGE FOR IV PUSH (FOR BLOOD PRESSURE SUPPORT)
PREFILLED_SYRINGE | INTRAVENOUS | Status: AC
  Filled 2015-03-31: qty 30

## 2015-03-31 MED ORDER — EPHEDRINE SULFATE 50 MG/ML IJ SOLN
INTRAMUSCULAR | Status: AC
  Filled 2015-03-31: qty 1

## 2015-03-31 MED ORDER — DEXTROSE 50 % IV SOLN
1.0000 | Freq: Once | INTRAVENOUS | Status: AC
  Administered 2015-03-31: 50 mL via INTRAVENOUS

## 2015-03-31 MED ORDER — TRACE MINERALS CR-CU-MN-SE-ZN 10-1000-500-60 MCG/ML IV SOLN
INTRAVENOUS | Status: AC
  Administered 2015-03-31: 17:00:00 via INTRAVENOUS
  Filled 2015-03-31: qty 960

## 2015-03-31 MED ORDER — POTASSIUM PHOSPHATES 15 MMOLE/5ML IV SOLN
20.0000 mmol | Freq: Once | INTRAVENOUS | Status: AC
  Administered 2015-03-31: 20 mmol via INTRAVENOUS
  Filled 2015-03-31: qty 6.67

## 2015-03-31 MED ORDER — INSULIN ASPART 100 UNIT/ML ~~LOC~~ SOLN
0.0000 [IU] | Freq: Four times a day (QID) | SUBCUTANEOUS | Status: DC
  Administered 2015-04-01 (×2): 2 [IU] via SUBCUTANEOUS
  Administered 2015-04-01 – 2015-04-02 (×2): 1 [IU] via SUBCUTANEOUS
  Administered 2015-04-02: 2 [IU] via SUBCUTANEOUS
  Administered 2015-04-02: 1 [IU] via SUBCUTANEOUS
  Administered 2015-04-02 – 2015-04-03 (×3): 2 [IU] via SUBCUTANEOUS

## 2015-03-31 MED ORDER — DEXTROSE 50 % IV SOLN
INTRAVENOUS | Status: AC
  Filled 2015-03-31: qty 50

## 2015-03-31 MED ORDER — DEXMEDETOMIDINE HCL 200 MCG/2ML IV SOLN: INTRAVENOUS | Status: DC | PRN

## 2015-03-31 MED ORDER — LACTATED RINGERS IV SOLN
INTRAVENOUS | Status: DC | PRN
  Administered 2015-03-31: 11:00:00 via INTRAVENOUS

## 2015-03-31 MED ORDER — PROPOFOL 10 MG/ML IV BOLUS
INTRAVENOUS | Status: AC
  Filled 2015-03-31: qty 20

## 2015-03-31 MED ORDER — STERILE WATER FOR INJECTION IJ SOLN
INTRAMUSCULAR | Status: AC
  Filled 2015-03-31: qty 10

## 2015-03-31 MED ORDER — POTASSIUM CHLORIDE 10 MEQ/50ML IV SOLN
INTRAVENOUS | Status: AC
  Filled 2015-03-31: qty 150

## 2015-03-31 MED ORDER — DEXTROSE-NACL 5-0.9 % IV SOLN
INTRAVENOUS | Status: DC
  Administered 2015-03-31: 10 mL/h via INTRAVENOUS

## 2015-03-31 MED ORDER — 0.9 % SODIUM CHLORIDE (POUR BTL) OPTIME
TOPICAL | Status: DC | PRN
  Administered 2015-03-31: 1000 mL

## 2015-03-31 MED ORDER — POTASSIUM CHLORIDE 10 MEQ/50ML IV SOLN
10.0000 meq | INTRAVENOUS | Status: AC
  Administered 2015-03-31 (×4): 10 meq via INTRAVENOUS

## 2015-03-31 SURGICAL SUPPLY — 40 items
CANISTER SUCTION 2500CC (MISCELLANEOUS) ×3 IMPLANT
CLIP TI MEDIUM 24 (CLIP) IMPLANT
CLIP TI WIDE RED SMALL 24 (CLIP) IMPLANT
DRAPE LAPAROSCOPIC ABDOMINAL (DRAPES) ×3 IMPLANT
ELECT BLADE 4.0 EZ CLEAN MEGAD (MISCELLANEOUS)
ELECT BLADE 6.5 EXT (BLADE) IMPLANT
ELECT REM PT RETURN 9FT ADLT (ELECTROSURGICAL) ×3
ELECTRODE BLDE 4.0 EZ CLN MEGD (MISCELLANEOUS) IMPLANT
ELECTRODE REM PT RTRN 9FT ADLT (ELECTROSURGICAL) ×1 IMPLANT
GLOVE BIO SURGEON STRL SZ7 (GLOVE) ×3 IMPLANT
GLOVE BIOGEL PI IND STRL 7.5 (GLOVE) ×1 IMPLANT
GLOVE BIOGEL PI INDICATOR 7.5 (GLOVE) ×2
GOWN STRL REUS W/ TWL LRG LVL3 (GOWN DISPOSABLE) ×2 IMPLANT
GOWN STRL REUS W/TWL LRG LVL3 (GOWN DISPOSABLE) ×4
INSERT FOGARTY 61MM (MISCELLANEOUS) IMPLANT
INSERT FOGARTY SM (MISCELLANEOUS) IMPLANT
KIT BASIN OR (CUSTOM PROCEDURE TRAY) ×3 IMPLANT
KIT REMOVER STAPLE SKIN (MISCELLANEOUS) ×3 IMPLANT
KIT ROOM TURNOVER OR (KITS) ×3 IMPLANT
NS IRRIG 1000ML POUR BTL (IV SOLUTION) ×6 IMPLANT
PACK AORTA (CUSTOM PROCEDURE TRAY) ×3 IMPLANT
PAD ARMBOARD 7.5X6 YLW CONV (MISCELLANEOUS) IMPLANT
RETAINER VISCERA MED (MISCELLANEOUS) IMPLANT
SPONGE ABDOMINAL VAC ABTHERA (MISCELLANEOUS) ×3 IMPLANT
SPONGE SURGIFOAM ABS GEL 100 (HEMOSTASIS) IMPLANT
STAPLER VISISTAT 35W (STAPLE) IMPLANT
SUT ETHIBOND 5 LR DA (SUTURE) IMPLANT
SUT PDS AB 1 TP1 96 (SUTURE) ×6 IMPLANT
SUT PROLENE 2 0 MH 48 (SUTURE) IMPLANT
SUT PROLENE 3 0 SH 48 (SUTURE) IMPLANT
SUT PROLENE 3 0 SH1 36 (SUTURE) IMPLANT
SUT PROLENE 5 0 C 1 24 (SUTURE) IMPLANT
SUT PROLENE 5 0 C 1 36 (SUTURE) IMPLANT
SUT SILK 2 0 SH CR/8 (SUTURE) IMPLANT
SUT VIC AB 2-0 CT1 36 (SUTURE) IMPLANT
SUT VIC AB 3-0 SH 27 (SUTURE)
SUT VIC AB 3-0 SH 27X BRD (SUTURE) IMPLANT
TOWEL BLUE STERILE X RAY DET (MISCELLANEOUS) IMPLANT
TRAY FOLEY W/METER SILVER 16FR (SET/KITS/TRAYS/PACK) IMPLANT
WATER STERILE IRR 1000ML POUR (IV SOLUTION) IMPLANT

## 2015-03-31 NOTE — Transfer of Care (Signed)
Immediate Anesthesia Transfer of Care Note  Patient: Kristina Estes  Procedure(s) Performed: Procedure(s): ABDOMINAL WASHOUT; POSSIBLE ABDOMINAL CLOSURE (N/A) APPLICATION OF WOUND VAC (N/A)  Patient Location: ICU and SICU  Anesthesia Type:General  Level of Consciousness: sedated and Patient remains intubated per anesthesia plan  Airway & Oxygen Therapy: Patient remains intubated per anesthesia plan and Patient placed on Ventilator (see vital sign flow sheet for setting)  Post-op Assessment: Report given to RN and Post -op Vital signs reviewed and stable  Post vital signs: Reviewed and stable  Last Vitals:  Filed Vitals:   03/31/15 0900 03/31/15 1000  BP: 85/42 97/84  Pulse: 73 74  Temp:    Resp: 20 20    Complications: No apparent anesthesia complications

## 2015-03-31 NOTE — Anesthesia Preprocedure Evaluation (Addendum)
Anesthesia Evaluation  Patient identified by MRN, date of birth, ID band Patient unresponsive    Reviewed: Allergy & Precautions, NPO status , Patient's Chart, lab work & pertinent test results, Unable to perform ROS - Chart review only  History of Anesthesia Complications Negative for: history of anesthetic complications  Airway Mallampati: Intubated       Dental   Pulmonary  VDRF s/p vascular complication during back surgery   breath sounds clear to auscultation       Cardiovascular Exercise Tolerance: Good hypertension, (-) angina Rhythm:Regular Rate:Tachycardia  Recent hypovolemic shock, now s/p resusc and transfusion, hemodynamically stable and off of pressors currently   Neuro/Psych Remains sedated and ventilated Chronic back pain    GI/Hepatic Neg liver ROS, GERD  Controlled,  Endo/Other  Morbid obesity  Renal/GU negative Renal ROS     Musculoskeletal  (+) Arthritis ,   Abdominal (+) + obese,   Peds  Hematology  (+) Blood dyscrasia (Hb 7.8), ,   Anesthesia Other Findings   Reproductive/Obstetrics                          Anesthesia Physical Anesthesia Plan  ASA: IV  Anesthesia Plan: General   Post-op Pain Management:    Induction: Inhalational  Airway Management Planned: Oral ETT  Additional Equipment: Arterial line  Intra-op Plan:   Post-operative Plan: Post-operative intubation/ventilation  Informed Consent:   History available from chart only  Plan Discussed with: CRNA and Surgeon  Anesthesia Plan Comments: (Plan routine monitors, GETA Consent obtained by Imogene Burn )        Anesthesia Quick Evaluation

## 2015-03-31 NOTE — Progress Notes (Addendum)
PARENTERAL NUTRITION CONSULT NOTE - INITIAL  Pharmacy Consult:  TPN Indication:  Open abdomen  Allergies  Allergen Reactions  . Propoxyphene Other (See Comments)    Patient Measurements: Height:  (160 cm) Weight: (!) 336 lb 3.2 oz (152.5 kg) IBW/kg (Calculated) : 52.4  BMI = 59.7  Vital Signs: Temp: 98.9 F (37.2 C) (02/21 0820) Temp Source: Axillary (02/21 0820) BP: 137/60 mmHg (02/21 0743) Pulse Rate: 70 (02/21 0743) Intake/Output from previous day: 02/20 0701 - 02/21 0700 In: 2677.8 [I.V.:2647.8; NG/GT:30] Out: 4530 [Urine:3705; Emesis/NG output:450; Drains:375] Intake/Output from this shift: Total I/O In: 162 [I.V.:112; IV Piggyback:50] Out: 1820 [Urine:1720; Drains:100]  Labs:  Recent Labs  03/29/15 1937 03/30/15 0338 03/31/15 0320  WBC 10.9* 10.7* 10.2  HGB 8.4* 7.9* 7.8*  HCT 24.9* 23.3* 23.5*  PLT 126* 129* 158     Recent Labs  03/28/15 1145 03/29/15 0407 03/30/15 0338 03/30/15 0926 03/31/15 0730  NA  --  144  --  143 141  K  --  3.1*  --  3.1* 2.9*  2.9*  CL  --  114*  --  114* 111  CO2  --  23  --  22 22  GLUCOSE  --  123*  --  98 100*  BUN  --  5*  --  <5* 7  CREATININE  --  0.62  --  0.65 0.64  CALCIUM  --  7.5*  --  7.6* 8.0*  MG 1.6* 2.0  --   --  1.8  PHOS  --   --   --   --  2.2*  TRIG  --   --  123  --   --    Estimated Creatinine Clearance: 129.5 mL/min (by C-G formula based on Cr of 0.64).    Recent Labs  03/31/15 0026 03/31/15 0354 03/31/15 0818  GLUCAP 84 70 72    Medical History: Past Medical History  Diagnosis Date  . Anginal pain (HCC) 2016    received referral to [facility]  . Dysrhythmia   . Asthma     as a child  . GERD (gastroesophageal reflux disease)   . Arthritis   . Obesity   . Difficult intravenous access 03/27/2015    Patient has no PIV targets, will require central line or PICC anytime general anesthesia is needed      Insulin Requirements in the past 24 hours:   Not yet on SSI  Assessment: 44 YOF admitted on 03/27/15 with back and leg pain and underwent L5 laminectomy with facetectomy later that day.  Intraoperatively patient went into shock and CT revealed large left RP hematoma.  Patient returned to the OR for ex-lap with repair of left iliac vein, ligation of distal left CIA, left CIA-CFA bypass, and placement of abdominal VAC.  Pharmacy consulted to start TPN for nutritional support.  GI: hx GERD / obesity.  Frequent OR visits for abd washout (2/19, 2/21) - too edematous for abd closure, perhaps Thurs Endo: no hx DM - hypoglycemic on 2/20, now low normal with IVF containing D5W Lytes: K+ 2.9 (4 runs ordered), Phos 2.2, others WNL Renal: SCr stable, CrCL 126 ml/min - D%NS at 50 ml/hr Pulm: intubated, FiO2 30% Cards: angina - hypotensive, HR controlled AC: Heparin for new left femoral DVT (dx 03/29/15) - HL therapeutic, hgb 7.8, plts WNL Hepatobil:LFTs WNL, tbili 1.5.  TG 157 > 123 Neuro: Propofol/Fentanyl gtts - GCS 10 ID: afebrile, WBC WNL Best Practices: heparin gtt,  MC TPN Access: right IJ placed 03/27/15 TPN start date: 2/21 >>  Current Nutrition:  NPO Propofol at 24.2 ml/hr = 639 kCal  Nutritional Goals:  1450-1890 kCal, 125-135 grams of protein per day   Plan:  - Initiate Clinimix E 5/15 at 40 ml/hr - Hold lipid emulsion for critically ill patients for the first 7 days of TPN per SCCM/ASPEN guidelines (D#1/7) as well as while patient is on Propofol. - TPN + Propofol will provide 1618 kCal and 48 gm protein per day.  Will be challenging to meet protein need given limitation of Clinimix. - Daily multivitamin and trace elements - Start sensitive SSI Q6H.  D/C if CBGs remain controlled at goal TPN rate - Reduce D5NS to 10 ml/hr when TPN starts - KPhos 20 mmol IV x 1 - Mag sulfate 1gm IV x 1 - F/U AM labs.  MD checking K+ Q6H x 6 occurrences.   Forrest Jaroszewski D. Laney Potash, PharmD, BCPS Pager:  [phone] 03/31/2015, 12:47 PM

## 2015-03-31 NOTE — Interval H&P Note (Signed)
History and Physical Interval Note:  03/31/2015 9:54 AM  Kristina Estes  has presented today for surgery, with the diagnosis of retroperitoneal hematoma  The various methods of treatment have been discussed with the patient and family. After consideration of risks, benefits and other options for treatment, the patient has consented to  Procedure(s): ABDOMINAL WASHOUT AND APPLICATION OF WOUND VAC (N/A) as a surgical intervention .  The patient's history has been reviewed, patient examined, no change in status, stable for surgery.  I have reviewed the patient's chart and labs.  Questions were answered to the patient's satisfaction.     Leonides Sake

## 2015-03-31 NOTE — Progress Notes (Addendum)
Nutrition Consult/Follow Up  DOCUMENTATION CODES:   Morbid obesity  INTERVENTION:    TPN per pharmacy  NUTRITION DIAGNOSIS:   Inadequate oral intake related to inability to eat as evidenced by NPO status, ongoing  GOAL:   Provide needs based on ASPEN/SCCM guidelines, progressing  MONITOR:   Vent status, Weight trends, Labs, Skin, I & O's, TPN prescription   ASSESSMENT:   45 year old with history of asthma, arthritis, obesity. She had an elective admission today for L5 laminectomy. Intra-Op she went into shock, hemodynamic instability. Subsequent CT showed large left retroperitoneal hematoma. She was put on 2 pressors (norepi, epinephrine) and transferred to ICU. PCCM consulted for help with management. Vascular surgery was also consulted. Intra OP TEE showed normal cardiac function.  Patient s/p procedure 2/19: ABDOMINAL WASHOUT NEGATIVE PRESSURE ABDOMINAL DRESSING  Patient is currently intubated on ventilator support -- NGT to LIS Temp (24hrs), Avg:98.6 F (37 C), Min:97.8 F (36.6 C), Max:99.1 F (37.3 C)   Propofol: 32.2 ml/hr ----> 850 fat kcals   RD consulted for new TPN.  Patient is receiving TPN with Clinimix E 5/15 @ 40 ml/hr and lipids @ 10 ml/hr.  TPN + Propofol will provide 2012 total kcals and 48 grams protein per day.  Meets 100% minimum estimated energy needs and 37% minimum estimated protein needs.  Diet Order:  Diet NPO time specified .TPN (CLINIMIX-E) Adult  Skin:  Wound (see comment) (abdominal wound VAC)  Last BM:  2/17  Height:   Ht Readings from Last 1 Encounters:  03/27/15  (1.6 m)    Weight:   Wt Readings from Last 1 Encounters:  03/30/15 336 lb 3.2 oz (152.5 kg)    Ideal Body Weight:  52.27 kg (kg)  BMI:  Body mass index is 59.57 kg/(m^2).  Estimated Nutritional Needs:   Kcal:  1610-9604  Protein:  130-140 gm  Fluid:  per MD  EDUCATION NEEDS:   No education needs identified at this time  Maureen Chatters, RD,  LDN Pager #: [phone] After-Hours Pager #: [phone]

## 2015-03-31 NOTE — Progress Notes (Signed)
ANTICOAGULATION CONSULT NOTE - Follow Up Consult  Pharmacy Consult for Heparin  Indication: DVT  Allergies  Allergen Reactions  . Propoxyphene Other (See Comments)    Patient Measurements: Height:  (160 cm) Weight: (!) 336 lb 3.2 oz (152.5 kg) IBW/kg (Calculated) : 52.4  Vital Signs: Temp: 98.9 F (37.2 C) (02/21 0820) Temp Source: Axillary (02/21 0820) BP: 95/41 mmHg (02/21 0700) Pulse Rate: 71 (02/21 0700)  Labs:  Recent Labs  03/28/15 1805  03/29/15 0407 03/29/15 1936 03/29/15 1937 03/30/15 0338 03/30/15 0339 03/30/15 0926 03/31/15 0320 03/31/15 0730  HGB  --   < > 8.7*  --  8.4* 7.9*  --   --  7.8*  --   HCT  --   < > 24.4*  --  24.9* 23.3*  --   --  23.5*  --   PLT  --   < > 113*  --  126* 129*  --   --  158  --   HEPARINUNFRC  --   --   --  0.34  --   --  0.57  --  0.39  --   CREATININE  --   --  0.62  --   --   --   --  0.65  --  0.64  CKTOTAL 359*  --  315*  --   --   --   --   --   --   --   < > = values in this interval not displayed.  Estimated Creatinine Clearance: 129.5 mL/min (by C-G formula based on Cr of 0.64).  Assessment: 45 y.o female with hemorraghic shock from large RP hematoma that occurred during elective operation. B DVT duplex consistent with L CFV DVT. Dr. Imogene Burn, vascular surgery noted that given no active bleeding intraoperatively, he will start heparin gtt. Pharmacy consulted to dose IV heparin. Ok to bolus per Dr. Imogene Burn.   HL remains therapeutic (0.39) on 1500 units/h. No further bleed noted. Hgb 7.8 (stable), pltc up to wnl. S/p 13 uPRBC, 11 FFP, 7 plt, 9 cryo so far this admit. To switch to lovenox when abdomen closed per MD note.  Goal of Therapy:  Heparin level 0.3-0.7 units/ml Monitor platelets by anticoagulation protocol: Yes   Plan:  -Cont heparin 1500 units/hr -Daily CBC/HL -Monitor for bleeding -Switch to lovenox when abdomen closed -F/u long-term AC plans after all OR trips completed, likely will need several more  prior to abdomen closure - next 2/21  Babs Bertin, PharmD, BCPS Clinical Pharmacist Pager [phone] 03/31/2015 8:59 AM

## 2015-03-31 NOTE — Progress Notes (Signed)
   Daily Progress Note  Bowels decompressed and edema greatly decreased.  Pt not a closure candidate today but I suspect she might be able to be closed on Thursday.  Continue aggressive diuresis.  I would avoid tube feeding for now to facilitate fascial closure, but might want to consider TPN given no nutrition up to now.   Leonides Sake, MD [facility] [place] Office: [phone] Pager: [phone]  03/31/2015, 11:27 AM

## 2015-03-31 NOTE — Op Note (Addendum)
    OPERATIVE NOTE   PROCEDURE: 1. Abdominal negative pressure dressing change  PRE-OPERATIVE DIAGNOSIS: open abdomen  POST-OPERATIVE DIAGNOSIS: same as above   SURGEON: Leonides Sake, MD  ASSISTANT(S): Doreatha Massed, PAC   ANESTHESIA: general  ESTIMATED BLOOD LOSS: 30 cc  FINDING(S): 1.  No active bleeding 2.  Intestinal edema greatly decreased 3.  Still too much edema to close abdomen  SPECIMEN(S):  none  INDICATIONS:   Kristina Estes is a 45 y.o. female who presents withopen abdomen after a massive resuscitation after a iliac vessel injury during a spine procedure. She returns today for washout of the abdomen, possible abdominal VAC placement, and possible closure of fascia.   DESCRIPTION: After obtaining full informed written consent, the patient was brought back to the operating room and placed supine upon the operating table.  The patient received IV antibiotics prior to induction.  The previous left incisional groin VAC dressing was removed and the left groin bandaged with sterile bandages.  After obtaining adequate anesthesia, the patient was prepped and draped in the standard fashion for: exploratory laparotomy..Her previous abdominal VAC dressing had been removed except for the interior drape prior to prepping. I removed the interior drape.  The bowels appeared greatly decompressed.  I placed Kocher clamps on the fascia on each side and tried to reapproximate the fascia over the bowels.  This was much better than before but there was still too much edema to close the fascia.  I suspect she might be able to finish closure this coming Thursday.  At this point, I obtained the abdominal VAC pack and fashioned the interior drape for the bowels.  I packed this in over the bowels.  I then applied two layers of sponge over the drape.  These sponges were affixed with adhesive strips.  I then cut a hole in the center drape and applied the lilypad.  The lilypad was connected to the  [facility] circuit which began suction at 125 continuous.  The VAC sponge became adherent.   COMPLICATIONS: none  CONDITION: stable   Leonides Sake, MD [facility] [place] Office: [phone] Pager: [phone]  03/31/2015, 11:20 AM

## 2015-03-31 NOTE — Progress Notes (Signed)
No issues overnight. Pt taken to OR today for wound vac change, bowel edema is improved but unable to close today.  EXAM:  BP 154/70 mmHg  Pulse 70  Temp(Src) 97.8 F (36.6 C) (Axillary)  Resp 16  Ht  (1.6 m)  Wt 152.5 kg (336 lb 3.2 oz)  BMI 59.57 kg/m2  SpO2 100%  LMP 03/27/2015  Remains intubated, sedated on propofol, fentanyl Minimal response to verbal stimuli Abd dressing in place Mild swelling LLE  IMPRESSION:  45 y.o. female with open abd after iliac v. repair, and LCIV-LCFA bypass Hemodynamically stable  PLAN: - Cont vent support per PCCM - TPN started - Per vasc surg, hopefully close abd on Thurs - Will address lumbar spine once she is stable, abd is closed, and she can be prone.

## 2015-03-31 NOTE — Progress Notes (Signed)
PULMONARY / CRITICAL CARE MEDICINE   Name: Kristina Estes MRN: [id number] DOB: 08-29-1970    ADMISSION DATE:  03/27/2015 CONSULTATION DATE:  03/27/15  REFERRING MD:  Conchita Paris  CHIEF COMPLAINT:  Hemorrhagic shock  HISTORY OF PRESENT ILLNESS:   Kristina Estes is a 45 year old with history of asthma, arthritis, obesity. She had an elective admission today for L5 laminectomy. Intra-Op she went into shock, hemodynamic instability. Subsequent CT showed large left retroperitoneal hematoma. She was put on 2 pressors (norepi, epinephrine) and transferred to ICU. PCCM consulted for help with management. Vascular surgery was also consulted, underwent repair of L CIV with bypass to CFA. Intra OP TEE showed normal cardiac function.  SUBJECTIVE:  Pt is sedated, comfortably laying. Limited info as pt is sedated.   VITAL SIGNS: BP 137/60 mmHg  Pulse 70  Temp(Src) 98.9 F (37.2 C) (Axillary)  Resp 20  Ht  (1.6 m)  Wt 336 lb 3.2 oz (152.5 kg)  BMI 59.57 kg/m2  SpO2 100%  LMP 03/27/2015  HEMODYNAMICS:    VENTILATOR SETTINGS: Vent Mode:  [-] PRVC FiO2 (%):  [30 %] 30 % Set Rate:  [20 bmp] 20 bmp Vt Set:  [450 mL] 450 mL PEEP:  [5 cmH20] 5 cmH20 Plateau Pressure:  [23 cmH20-28 cmH20] 25 cmH20  INTAKE / OUTPUT: I/O last 3 completed shifts: In: 4461 [I.V.:4341; NG/GT:120] Out: 7185 [Urine:5660; Emesis/NG output:650; Drains:875]  PHYSICAL EXAMINATION: General: Sedated, comfortably laying in bed Neuro: CN grossly intact. Pt is sedated so limited exam.  HEENT: PERLA, (+) corneals; NV difficult to assess; (+) ET tube; (-) LAD Cardiovascular: Good si/s2; (-) s3, m,r,g Lungs: Dec BS BLS. Bibasilar crackles. (-) wheeze. (-) accessory muscle use Abdomen: Dec BS. (+) open abd. (+) surgical dressing Musculoskeletal: Gr 2 edema. less edema this am.  Skin: Skin was warm and dry  LABS:  BMET  Recent Labs Lab 03/29/15 0407 03/30/15 0926 03/31/15 0730  NA 144 143 141  K 3.1*  3.1* 2.9*  CL 114* 114* 111  CO2 BUN 5* <5* 7  CREATININE 0.62 0.65 0.64  GLUCOSE 123* 98 100*    Electrolytes  Recent Labs Lab 03/27/15 2205  03/28/15 1145 03/29/15 0407 03/30/15 0926 03/31/15 0730  CALCIUM 7.5*  < >  --  7.5* 7.6* 8.0*  MG 1.2*  --  1.6* 2.0  --   --   < > = values in this interval not displayed.  CBC  Recent Labs Lab 03/29/15 1937 03/30/15 0338 03/31/15 0320  WBC 10.9* 10.7* 10.2  HGB 8.4* 7.9* 7.8*  HCT 24.9* 23.3* 23.5*  PLT 126* 129* 158    Coag's  Recent Labs Lab 03/27/15 2205 03/27/15 2206 03/28/15 0547  APTT 37 36 30  INR 1.32 1.31 1.23    Sepsis Markers  Recent Labs Lab 03/27/15 1210  LATICACIDVEN 3.9*    ABG  Recent Labs Lab 03/28/15 0743 03/29/15 0334 03/30/15 0324  PHART 7.325* 7.424 7.455*  PCO2ART 41.4 34.7* 31.3*  PO2ART 149.0* 161.0* 133.0*    Liver Enzymes  Recent Labs Lab 03/27/15 1210 03/27/15 2205 03/28/15 0547  AST 17 32 39  ALT 11* 20 20  ALKPHOS 40 41 44  BILITOT 0.6 2.7* 1.5*  ALBUMIN 1.8* 3.0* 2.9*    Cardiac Enzymes  Recent Labs Lab 03/27/15 1210  TROPONINI 0.11*    Glucose  Recent Labs Lab 03/29/15 0016 03/30/15 2026 03/30/15 2051 03/31/15 0026 03/31/15 0354 03/31/15 0818  GLUCAP 115* 55* 72 84 70  72    Imaging No results found. Ct Abdomen Pelvis Wo Contrast  03/27/2015 CLINICAL DATA: 45 year old who became acutely hypotensive intraoperatively during L5-S1 posterior decompression and discectomy earlier today. EXAM: CT ABDOMEN AND PELVIS WITHOUT CONTRAST TECHNIQUE: Multidetector CT imaging of the abdomen and pelvis was performed following the standard protocol without IV contrast. COMPARISON: None. FINDINGS: Lower chest: Blurred by respiratory motion. Mild dependent atelectasis posteriorly in the lower lobes. Visualized lung bases otherwise clear. Normal heart size. Hepatobiliary: Beam hardening streak artifact as the patient was unable to raise the arms.  Allowing for this, normal unenhanced appearance of the liver. Gallbladder surgically absent. No unexpected biliary ductal dilation. Pancreas: Normal unenhanced appearance. Spleen: Normal unenhanced appearance. Adrenals/Urinary Tract: Normal appearing adrenal glands. Unenhanced appearance of the right kidney. Acute perinephric and subcapsular hematoma involving the left kidney. No visible focal parenchymal abnormality involving the left kidney, allowing for the unenhanced technique. Urinary bladder decompressed by Foley catheter. Stomach/Bowel: Stomach normal in appearance for the degree of distention. Normal-appearing small bowel. Normal-appearing colon with expected stool burden. Normal appendix in the right upper pelvis. Vascular/Lymphatic: No visible aortoiliofemoral atherosclerosis. No pathologic lymphadenopathy. Reproductive: Normal-appearing uterus and ovaries without evidence of adnexal mass. Other: Large retroperitoneal hematoma involving predominantly the left side of the abdomen and pelvis, extending from the level of the upper pole of the left kidney inferiorly to the low pelvis, maximum measurements approximating 7.4 x 9.9 x 17.0 cm. The largest portion of the hematoma is anterior to the left psoas muscle. There is a smaller component of the retroperitoneal hematoma in the right side of the abdomen. The retroperitoneal hematoma displaces the left kidney anteriorly. A small amount of intraperitoneal hemorrhage is present in the paracolic gutters, left greater than right. Musculoskeletal: Postsurgical changes related to L5-S1 discectomy and posterior decompression at L5. Gas bubbles in the surgical tract, the epidural space and the L5-S1 disc. No abnormal fluid collection to suggest abscess or leak. IMPRESSION: 1. Large retroperitoneal hematoma involving predominantly the left side of the abdomen and pelvis, maximum measurements given above. 2. Subcapsular left renal hematoma and left perinephric hematoma,  contiguous with the retroperitoneal hematoma. 3. Small amount of intraperitoneal hemorrhage in the paracolic gutters, left greater than right. 4. Expected postsurgical changes at L5-S1. Given the fact that the patient has a subcapsular left renal hematoma and a left perinephric hematoma, the possible etiology of the retroperitoneal hematoma may be due to a lesion in the left kidney. Repeat CT of the abdomen and pelvis with contrast may be helpful once the patient has recovered from surgery to exclude that possibility. These results were discussed directly with Dr. Conchita Paris at the time of interpretation 03/27/2015 at 1 o'clock p.m. Electronically Signed By: Hulan Saas M.D. On: 03/27/2015 13:27   US Abdomen Limited  03/27/2015 CLINICAL DATA: Intraoperative exam to assess for abdominal/pelvic fluid/ hematoma. CT scan after this ultrasound reveals large retroperitoneal hematoma left-greater-than-right as well as left perinephric and subscapular hematoma of which Dr.Nundkumar is aware. EXAM: LIMITED ABDOMINAL ULTRASOUND COMPARISON: CT 03/27/2015 FINDINGS: Limited all intraoperative ultrasound evaluation of the bilateral lower quadrants demonstrates no definite focal free fluid collection. IMPRESSION: No definite focal fluid collection within the lower quadrants. Electronically Signed By: Elberta Fortis M.D. On: 03/27/2015 14:13   Portable Chest Xray  03/28/2015 CLINICAL DATA: Intubation EXAM: PORTABLE CHEST 1 VIEW COMPARISON: Yesterday FINDINGS: Tubular devices are stable. Cardiomegaly is stable. Hazy opacity throughout the left hemi thorax has increased which may represent hazy airspace disease or layering pleural fluid. Vascularity  is unremarkable. IMPRESSION: Increasing AC opacity throughout the left hemi thorax as described representing either hazy airspace disease or layering pleural fluid. Electronically Signed By: Jolaine Click M.D. On: 03/28/2015 08:56   Dg Chest Port 1  View  03/27/2015 CLINICAL DATA: Open wound in the anterior abdomen. Intubated. EXAM: PORTABLE CHEST 1 VIEW COMPARISON: None. FINDINGS: Endotracheal tube tip is 2.3 cm above the carina. Enteric tube enters the stomach, with the tip not seen on this image. Right internal jugular central venous catheter terminates in the middle third of the superior vena cava. Left internal jugular central venous sheath is noted with the tip overlying the left superior mediastinum, probably within the lower left internal jugular vein or left brachiocephalic vein. Top-normal heart size and normal mediastinal contour accounting for low lung volumes. No pneumothorax. No pleural effusion. Prominence of the interstitial markings probably represents vascular crowding due to low lung volumes. There is left lower lobe opacity. IMPRESSION: 1. Well-positioned support hardware as described. 2. Low lung volumes. Left lower lobe opacity likely represents atelectasis. Electronically Signed By: Delbert Phenix M.D. On: 03/27/2015 22:20   Dg Abd Portable 1v  03/27/2015 CLINICAL DATA: 45 year old female status post -abdominal surgery. Incorrect surgical device count EXAM: PORTABLE ABDOMEN - 1 VIEW COMPARISON: CT dated 03/27/2015 FINDINGS: Evaluation is limited due to soft tissue attenuation by body habitus as well as portable technique. Support lines noted overlying the left upper abdomen. Right upper quadrant cholecystectomy clips seen. Multiple cutaneous surgical clips noted in the lower abdomen to the right of the spine. No other radiopaque foreign object identified. There is no evidence of bowel obstruction. No radiopaque calculi. The osseous structures are grossly unremarkable. IMPRESSION: No definite evidence of radiopaque surgical foreign object These results were called by telephone at the time of interpretation on 03/27/2015 at 8:08 pm to Nurse Julien Girt, who verbally acknowledged these results. Electronically Signed By: Elgie Collard M.D. On: 03/27/2015 20:11   STUDIES:  CT abd 2/17 >> Large RP hematoma with renal, perinephric hematoma Intra op TEE 2/17 >> Normal cardiac function by verbal report.   CULTURES: MRSA screen (2/18) (-)  ANTIBIOTICS: Cefazolin 2/17 >> 2/18  SIGNIFICANT EVENTS: 2/17 - Admit for L5 laminectomy >> hemorrhagic shock from RP bleed.   LINES/TUBES: Rt IJ 2/17 >>  DISCUSSION: 44 y/o with hemorraghic shock from large RP hematoma that occurred during operation for L5 laminectomy, s/p L CIV repair  ASSESSMENT / PLAN:  PULMONARY A: Acute Hypoxemic Hypercapneic Respiratory Failure 2 to Hemorrhagic Shock, Unable to Protect the Airway, Pulmonary Edema, RVD 2 to Morbid Obesity. History of asthma -- seems stable.  Possible OSA P:  --cont vent support for now. On 30% Fio2. Still have active Abd issues. --start weaning once surgery is done. Plan for OR this am --cont diuresis. --will consider weaning in am. Plan to taper down sedation post op.   CARDIOVASCULAR A:  S/P Hemorrhagic Shock. Off pressors. Hb has been stable. Pulm edema P:  --cont diuresis. Correct K.   RENAL A:  Has Volume Overload P:  --cont diresis --replace K --rpt CXR in AM  GASTROINTESTINAL A:  Malnutrition, likely moderate.  P:  --Cont NPO --PUD prophylaxis --Nutrition consult >> needs TPN. It might be awhile before we can use the gut and pt is malnourished,   HEMATOLOGIC A:  S/P Hemorrhagic Shock with large RP bleed. LUE DVT P:  --Hb and hct stable last 24 hrs. No blood transfusion last 24 hrs.  --cont heparin drip --plan for surgery this  am.   INFECTIOUS A:  No active infection P:  --Not on Abx. --Mentioned to nurse to contact Neurosurgery to check the dressing/sutures at the back.    ENDOCRINE A:  No issues P:  --keep BS < 180 mg% --on SSI  NEUROLOGIC A:  Post op pain P:  --RASS goal: -1 --On propofol drip. Will consider switching to  precedex post op or just fentanyl drip to facilitate weaning.  --Check triglycerides daily (2/20 was 123)   FAMILY  Updates: No family around.   Inter-disciplinary family meet or Palliative Care meeting due by: 2/24  Critical care time spent on this patient today : 30 minutes.    Lance Sell A. Christene Slates, MD Pulmonary and Critical Care Medicine [facility] Pager: [phone] After 3 pm or if no response, call [phone]  03/31/2015, 9:19 AM

## 2015-04-01 ENCOUNTER — Encounter (HOSPITAL_COMMUNITY): Payer: Self-pay | Admitting: Vascular Surgery

## 2015-04-01 ENCOUNTER — Inpatient Hospital Stay (HOSPITAL_COMMUNITY): Payer: PRIVATE HEALTH INSURANCE

## 2015-04-01 LAB — COMPREHENSIVE METABOLIC PANEL
ALBUMIN: 2.1 g/dL — AB (ref 3.5–5.0)
ALK PHOS: 43 U/L (ref 38–126)
ALT: 13 U/L — AB (ref 14–54)
AST: 16 U/L (ref 15–41)
Anion gap: 12 (ref 5–15)
BILIRUBIN TOTAL: 0.8 mg/dL (ref 0.3–1.2)
BUN: 6 mg/dL (ref 6–20)
CALCIUM: 8.3 mg/dL — AB (ref 8.9–10.3)
CO2: 23 mmol/L (ref 22–32)
CREATININE: 0.72 mg/dL (ref 0.44–1.00)
Chloride: 106 mmol/L (ref 101–111)
GFR calc Af Amer: >60 mL/min (ref >60)
GLUCOSE: 129 mg/dL — AB (ref 65–99)
POTASSIUM: 3.3 mmol/L — AB (ref 3.5–5.1)
Sodium: 141 mmol/L (ref 135–145)
TOTAL PROTEIN: 5.2 g/dL — AB (ref 6.5–8.1)

## 2015-04-01 LAB — GLUCOSE, CAPILLARY
GLUCOSE-CAPILLARY: 112 mg/dL — AB (ref 65–99)
GLUCOSE-CAPILLARY: 166 mg/dL — AB (ref 65–99)
Glucose-Capillary: 123 mg/dL — ABNORMAL HIGH (ref 65–99)
Glucose-Capillary: 151 mg/dL — ABNORMAL HIGH (ref 65–99)
Glucose-Capillary: 158 mg/dL — ABNORMAL HIGH (ref 65–99)

## 2015-04-01 LAB — DIFFERENTIAL
Basophils Absolute: 0 10*3/uL (ref 0.0–0.1)
Basophils Relative: 0 %
EOS ABS: 0.4 10*3/uL (ref 0.0–0.7)
EOS PCT: 4 %
LYMPHS ABS: 1.8 10*3/uL (ref 0.7–4.0)
LYMPHS PCT: 18 %
MONOS PCT: 6 %
Monocytes Absolute: 0.6 10*3/uL (ref 0.1–1.0)
NEUTROS PCT: 72 %
Neutro Abs: 7.2 10*3/uL (ref 1.7–7.7)

## 2015-04-01 LAB — CBC
HEMATOCRIT: 25 % — AB (ref 36.0–46.0)
HEMOGLOBIN: 8.2 g/dL — AB (ref 12.0–15.0)
MCH: 28.6 pg (ref 26.0–34.0)
MCHC: 32.8 g/dL (ref 30.0–36.0)
MCV: 87.1 fL (ref 78.0–100.0)
Platelets: 188 10*3/uL (ref 150–400)
RBC: 2.87 MIL/uL — AB (ref 3.87–5.11)
RDW: 15.1 % (ref 11.5–15.5)
WBC: 10 10*3/uL (ref 4.0–10.5)

## 2015-04-01 LAB — PREALBUMIN: PREALBUMIN: 8.4 mg/dL — AB (ref 18–38)

## 2015-04-01 LAB — POTASSIUM
POTASSIUM: 3.5 mmol/L (ref 3.5–5.1)
POTASSIUM: 4.1 mmol/L (ref 3.5–5.1)
Potassium: 3.6 mmol/L (ref 3.5–5.1)

## 2015-04-01 LAB — HEPARIN LEVEL (UNFRACTIONATED)
HEPARIN UNFRACTIONATED: 0.26 [IU]/mL — AB (ref 0.30–0.70)
HEPARIN UNFRACTIONATED: 0.4 [IU]/mL (ref 0.30–0.70)
Heparin Unfractionated: 0.16 IU/mL — ABNORMAL LOW (ref 0.30–0.70)

## 2015-04-01 LAB — MAGNESIUM: Magnesium: 1.8 mg/dL (ref 1.7–2.4)

## 2015-04-01 LAB — PHOSPHORUS: Phosphorus: 3 mg/dL (ref 2.5–4.6)

## 2015-04-01 MED ORDER — TRACE MINERALS CR-CU-MN-SE-ZN 10-1000-500-60 MCG/ML IV SOLN
INTRAVENOUS | Status: AC
  Administered 2015-04-01: 17:00:00 via INTRAVENOUS
  Filled 2015-04-01: qty 1992

## 2015-04-01 MED ORDER — MAGNESIUM SULFATE 2 GM/50ML IV SOLN
2.0000 g | Freq: Once | INTRAVENOUS | Status: AC
  Administered 2015-04-01: 2 g via INTRAVENOUS
  Filled 2015-04-01: qty 50

## 2015-04-01 MED ORDER — FUROSEMIDE 10 MG/ML IJ SOLN
20.0000 mg | Freq: Three times a day (TID) | INTRAMUSCULAR | Status: DC
  Administered 2015-04-01 – 2015-04-03 (×5): 20 mg via INTRAVENOUS
  Filled 2015-04-01 (×6): qty 2

## 2015-04-01 MED ORDER — POTASSIUM CHLORIDE 10 MEQ/50ML IV SOLN
10.0000 meq | INTRAVENOUS | Status: AC
  Administered 2015-04-01 (×5): 10 meq via INTRAVENOUS
  Filled 2015-04-01: qty 50

## 2015-04-01 MED ORDER — DEXTROSE 5 % IV SOLN
1.5000 g | INTRAVENOUS | Status: DC
  Filled 2015-04-01: qty 1.5

## 2015-04-01 MED ORDER — HEPARIN (PORCINE) IN NACL 100-0.45 UNIT/ML-% IJ SOLN
2300.0000 [IU]/h | INTRAMUSCULAR | Status: DC
  Administered 2015-04-02: 2100 [IU]/h via INTRAVENOUS
  Administered 2015-04-02: 1950 [IU]/h via INTRAVENOUS
  Administered 2015-04-03 – 2015-04-05 (×6): 2300 [IU]/h via INTRAVENOUS
  Filled 2015-04-01 (×13): qty 250

## 2015-04-01 MED ORDER — DEXMEDETOMIDINE HCL IN NACL 400 MCG/100ML IV SOLN
0.4000 ug/kg/h | INTRAVENOUS | Status: DC
  Administered 2015-04-01 (×2): 0.5 ug/kg/h via INTRAVENOUS
  Administered 2015-04-01: 0.4 ug/kg/h via INTRAVENOUS
  Administered 2015-04-02: 0.7 ug/kg/h via INTRAVENOUS
  Administered 2015-04-02 (×3): 0.5 ug/kg/h via INTRAVENOUS
  Administered 2015-04-02: 0.499 ug/kg/h via INTRAVENOUS
  Administered 2015-04-02 – 2015-04-03 (×4): 0.7 ug/kg/h via INTRAVENOUS
  Administered 2015-04-03: 0.8 ug/kg/h via INTRAVENOUS
  Administered 2015-04-03 (×2): 0.7 ug/kg/h via INTRAVENOUS
  Administered 2015-04-04 (×2): 0.8 ug/kg/h via INTRAVENOUS
  Administered 2015-04-04: 0.7 ug/kg/h via INTRAVENOUS
  Administered 2015-04-04 (×2): 0.6 ug/kg/h via INTRAVENOUS
  Administered 2015-04-05: 0.8 ug/kg/h via INTRAVENOUS
  Administered 2015-04-05: 0.7 ug/kg/h via INTRAVENOUS
  Administered 2015-04-05: 0.5 ug/kg/h via INTRAVENOUS
  Administered 2015-04-05 (×2): 0.7 ug/kg/h via INTRAVENOUS
  Administered 2015-04-05: 0.6 ug/kg/h via INTRAVENOUS
  Administered 2015-04-06: 0.4 ug/kg/h via INTRAVENOUS
  Administered 2015-04-06: 0.6 ug/kg/h via INTRAVENOUS
  Administered 2015-04-06 – 2015-04-07 (×6): 0.4 ug/kg/h via INTRAVENOUS
  Administered 2015-04-08: 0.7 ug/kg/h via INTRAVENOUS
  Administered 2015-04-08: 0.4 ug/kg/h via INTRAVENOUS
  Administered 2015-04-08: 0.7 ug/kg/h via INTRAVENOUS
  Administered 2015-04-08: 0.4 ug/kg/h via INTRAVENOUS
  Administered 2015-04-09 (×2): 0.7 ug/kg/h via INTRAVENOUS
  Filled 2015-04-01: qty 100
  Filled 2015-04-01 (×2): qty 50
  Filled 2015-04-01 (×7): qty 100
  Filled 2015-04-01: qty 50
  Filled 2015-04-01 (×9): qty 100
  Filled 2015-04-01: qty 50
  Filled 2015-04-01 (×2): qty 100
  Filled 2015-04-01: qty 50
  Filled 2015-04-01: qty 100
  Filled 2015-04-01 (×2): qty 50
  Filled 2015-04-01 (×5): qty 100
  Filled 2015-04-01: qty 50
  Filled 2015-04-01 (×5): qty 100
  Filled 2015-04-01: qty 50
  Filled 2015-04-01: qty 100
  Filled 2015-04-01: qty 50
  Filled 2015-04-01: qty 100
  Filled 2015-04-01: qty 50

## 2015-04-01 NOTE — Progress Notes (Signed)
No issues overnight. Pt arousable, follows commands briskly BLE, appears to have good symmetric strength.   Cont mgmt per vasc - plan on OR tomorrow for likely closure.  I did speak with the patient's husband today. He was again updated on the plan above. All his questions were answered.

## 2015-04-01 NOTE — Progress Notes (Signed)
ANTICOAGULATION CONSULT NOTE - Follow Up Consult  Pharmacy Consult for Heparin  Indication: DVT  Allergies  Allergen Reactions  . Propoxyphene Other (See Comments)    Unspecified    Patient Measurements: Height:  (160 cm) Weight: 284 lb 6.3 oz (129 kg) IBW/kg (Calculated) : 52.4 Heparin dosing wt: 91 kg  Vital Signs: Temp: 99 F (37.2 C) (02/22 0817) Temp Source: Axillary (02/22 0817) BP: 136/85 mmHg (02/22 0747) Pulse Rate: 88 (02/22 1100)  Labs:  Recent Labs  03/30/15 0338 03/30/15 0339 03/30/15 0926 03/31/15 0320 03/31/15 0730 04/01/15 0417 04/01/15 0418  HGB 7.9*  --   --  7.8*  --  8.2*  --   HCT 23.3*  --   --  23.5*  --  25.0*  --   PLT 129*  --   --  158  --  188  --   HEPARINUNFRC  --  0.57  --  0.39  --   --  0.26*  CREATININE  --   --  0.65  --  0.64 0.72  --     Estimated Creatinine Clearance: 116.4 mL/min (by C-G formula based on Cr of 0.72).  Assessment: 45 y.o female with hemorraghic shock from large RP hematoma that occurred during elective operation. B DVT duplex consistent with L CFV DVT. Dr. Imogene Burn, vascular surgery noted that given no active bleeding intraoperatively, will start heparin gtt. Pharmacy consulted to dose IV heparin. Ok to bolus per Dr. Imogene Burn.  HL therapeutic x1 (0.4) on 1650 units/h. No further bleed noted. No issues with line per RN. Hgb low but stable, pltc up to wnl. S/p 13 uPRBC, 11 FFP, 7 plt, 9 cryo so far this admit. To switch to lovenox when abdomen closed per MD note.  Goal of Therapy:  Heparin level 0.3-0.7 units/ml Monitor platelets by anticoagulation protocol: Yes   Plan:  Heparin at 1650 units/h 6h HL to confirm, Daily CBC/HL Monitor for bleeding Switch to lovenox when abdomen closed F/u long-term AC plans after all OR trips completed, hopeful to close 2/23  Babs Bertin, PharmD, [facility] Clinical Pharmacist Pager [phone] 04/01/2015 11:29 AM

## 2015-04-01 NOTE — Progress Notes (Addendum)
[facility] [place]  Subjective  - intubated, does not respond to voice.   Objective 124/53 81 99.5 F (37.5 C) (Axillary) 20 100%  Intake/Output Summary (Last 24 hours) at 04/01/15 0715 Last data filed at 04/01/15 0700  Gross per 24 hour  Intake 3698.54 ml  Output   5195 ml  Net -1496.46 ml    Palpable right foot DP, left foot warm and well perfused Abdomin soft, wound vac in place  Assessment/Planning: POD # 5 PROCEDURE: 1. Exploratory laparotomy 2. Repair of left iliac vein (dictated by Dr. Arbie Cookey) 3. Ligation of distal left common iliac artery (dictated by Dr. Arbie Cookey) 4. Left common iliac artery to left common femoral artery bypass with 8 mm Dacron 5. Placement of abdominal negative pressure dressing TPN started Plan return to OR tomorrow for abdominal closure by Dr. Imogene Burn.  Clinton Gallant [facility] [address] 04/01/2015 7:15 AM --  Laboratory Lab Results:  Recent Labs  03/31/15 0320 04/01/15 0417  WBC 10.2 10.0  HGB 7.8* 8.2*  HCT 23.5* 25.0*  PLT 158 188   BMET  Recent Labs  03/31/15 0730  04/01/15 0011 04/01/15 0417  NA 141  --   --  141  K 2.9*  2.9*  < > 3.5 3.3*  CL 111  --   --  106  CO2 22  --   --  23  GLUCOSE 100*  --   --  129*  BUN 7  --   --  6  CREATININE 0.64  --   --  0.72  CALCIUM 8.0*  --   --  8.3*  < > = values in this interval not displayed.  COAG Lab Results  Component Value Date   INR 1.23 03/28/2015   INR 1.31 03/27/2015   INR 1.32 03/27/2015   No results found for: PTT  Addendum  I have independently interviewed and examined the patient, and I agree with the physician assistant's findings.  Continued diuresis.  Increased to q8 hr.  Replete K.  Hopefully can close abdomen tomorrow.  Leonides Sake, MD [facility] [place] Office: [phone] Pager: [phone]  04/01/2015, 8:14 AM

## 2015-04-01 NOTE — Progress Notes (Signed)
ANTICOAGULATION CONSULT NOTE - Follow Up Consult  Pharmacy Consult for Heparin  Indication: DVT  Allergies  Allergen Reactions  . Propoxyphene Other (See Comments)    Unspecified    Patient Measurements: Height:  (160 cm) Weight: 284 lb 6.3 oz (129 kg) IBW/kg (Calculated) : 52.4 Heparin dosing wt: 91 kg  Vital Signs: Temp: 99.2 F (37.3 C) (02/22 2045) Temp Source: Oral (02/22 2045) BP: 109/58 mmHg (02/22 2045) Pulse Rate: 79 (02/22 2045)  Labs:  Recent Labs  03/30/15 8657  03/30/15 0926 03/31/15 0320 03/31/15 0730 04/01/15 0417 04/01/15 0418 04/01/15 1115 04/01/15 1956  HGB 7.9*  --   --  7.8*  --  8.2*  --   --   --   HCT 23.3*  --   --  23.5*  --  25.0*  --   --   --   PLT 129*  --   --  158  --  188  --   --   --   HEPARINUNFRC  --   < >  --  0.39  --   --  0.26* 0.40 0.16*  CREATININE  --   --  0.65  --  0.64 0.72  --   --   --   < > = values in this interval not displayed.  Estimated Creatinine Clearance: 116.4 mL/min (by C-G formula based on Cr of 0.72).  Assessment: 45 y.o female with hemorraghic shock from large RP hematoma that occurred during elective operation. B DVT duplex consistent with L CFV DVT. Dr. Imogene Burn, vascular surgery noted that given no active bleeding intraoperatively, will start heparin gtt. Ok to bolus per Dr. Imogene Burn.  --PM: HL down to 0.16. No interruptions or line problems reported by RN.  Goal of Therapy:  Heparin level 0.3-0.7 units/ml Monitor platelets by anticoagulation protocol: Yes   Plan:  Increase heparin level to 1950 units/hr Will recheck HL and CBC in AM  Magan Winnett S. Merilynn Finland, PharmD, [facility] Clinical Staff Pharmacist Pager [phone]   04/01/2015 9:02 PM

## 2015-04-01 NOTE — Progress Notes (Signed)
ANTICOAGULATION CONSULT NOTE - Follow Up Consult  Pharmacy Consult for Heparin  Indication: DVT  Allergies  Allergen Reactions  . Propoxyphene Other (See Comments)    Patient Measurements: Height:  (160 cm) Weight: (!) 336 lb 3.2 oz (152.5 kg) IBW/kg (Calculated) : 52.4 Heparin dosing wt: 91 kg  Vital Signs: Temp: 99.9 F (37.7 C) (02/21 2000) Temp Source: Axillary (02/21 2000) Pulse Rate: 80 (02/22 0306)  Labs:  Recent Labs  03/30/15 0338 03/30/15 9562 03/30/15 0926 03/31/15 0320 03/31/15 0730 04/01/15 0417 04/01/15 0418  HGB 7.9*  --   --  7.8*  --  8.2*  --   HCT 23.3*  --   --  23.5*  --  25.0*  --   PLT 129*  --   --  158  --  188  --   HEPARINUNFRC  --  0.57  --  0.39  --   --  0.26*  CREATININE  --   --  0.65  --  0.64  --   --     Estimated Creatinine Clearance: 129.5 mL/min (by C-G formula based on Cr of 0.64).  Assessment: 45 y.o female with hemorraghic shock from large RP hematoma that occurred during elective operation. B DVT duplex consistent with L CFV DVT. Dr. Imogene Burn, vascular surgery noted that given no active bleeding intraoperatively, he will start heparin gtt. Pharmacy consulted to dose IV heparin. Ok to bolus per Dr. Imogene Burn.  HL now subtherapeutic (0.26) on 1500 units/h. No further bleed noted. No issues with line per RN. Hgb low but stable, pltc up to wnl. S/p 13 uPRBC, 11 FFP, 7 plt, 9 cryo so far this admit. To switch to lovenox when abdomen closed per MD note.  Goal of Therapy:  Heparin level 0.3-0.7 units/ml Monitor platelets by anticoagulation protocol: Yes   Plan:  -Increase heparin to 1650 units/hr -F/u 6 hr heparin level  Christoper Fabian, PharmD, BCPS Clinical pharmacist, pager [phone] 04/01/2015 4:57 AM

## 2015-04-01 NOTE — Progress Notes (Signed)
PARENTERAL NUTRITION CONSULT NOTE - FOLLOW UP  Pharmacy Consult:  TPN Indication:  Open abdomen  Allergies  Allergen Reactions  . Propoxyphene Other (See Comments)    Patient Measurements: Height:  (160 cm) Weight: 284 lb 6.3 oz (129 kg) IBW/kg (Calculated) : 52.4  BMI = 59.7  Vital Signs: Temp: 99.5 F (37.5 C) (02/22 0400) Temp Source: Axillary (02/22 0400) BP: 136/85 mmHg (02/22 0747) Pulse Rate: 112 (02/22 0747) Intake/Output from previous day: 02/21 0701 - 02/22 0700 In: 3698.5 [I.V.:2319.2; NG/GT:30; IV Piggyback:806.7; TPN:542.7] Out: 5195 [Urine:4595; Emesis/NG output:500; Drains:100]  Labs:  Recent Labs  03/30/15 0338 03/31/15 0320 04/01/15 0417  WBC 10.7* 10.2 10.0  HGB 7.9* 7.8* 8.2*  HCT 23.3* 23.5* 25.0*  PLT 129* 158 188     Recent Labs  03/30/15 0338  03/30/15 0926 03/31/15 0730 03/31/15 1820 04/01/15 0011 04/01/15 0417  NA  --   --  143 141  --   --  141  K  --   < > 3.1* 2.9*  2.9* 3.8 3.5 3.3*  CL  --   --  114* 111  --   --  106  CO2  --   --  22 22  --   --  23  GLUCOSE  --   --  98 100*  --   --  129*  BUN  --   --  <5* 7  --   --  6  CREATININE  --   --  0.65 0.64  --   --  0.72  CALCIUM  --   --  7.6* 8.0*  --   --  8.3*  MG  --   --   --  1.8  --   --  1.8  PHOS  --   --   --  2.2*  --   --  3.0  PROT  --   --   --   --   --   --  5.2*  ALBUMIN  --   --   --   --   --   --  2.1*  AST  --   --   --   --   --   --  16  ALT  --   --   --   --   --   --  13*  ALKPHOS  --   --   --   --   --   --  43  BILITOT  --   --   --   --   --   --  0.8  PREALBUMIN  --   --   --   --   --   --  8.4*  TRIG 123  --   --   --   --   --   --   < > = values in this interval not displayed. Estimated Creatinine Clearance: 116.4 mL/min (by C-G formula based on Cr of 0.72).    Recent Labs  03/31/15 1750 03/31/15 2255 04/01/15 0408  GLUCAP 81 112* 123*     Insulin Requirements in the past 24 hours:  None  Assessment: 44 YOF  admitted on 03/27/15 with back and leg pain and underwent L5 laminectomy with facetectomy later that day.  Intraoperatively patient went into shock and CT revealed large left RP hematoma.  Patient returned to the OR for ex-lap with repair of left iliac vein, ligation of distal left CIA, left CIA-CFA  bypass, and placement of abdominal VAC.  Pharmacy consulted to manage TPN for nutritional support.  GI: hx GERD / obesity.  Baseline prealbumin low at 8.4.  Frequent OR visits for abd washout (2/19, 2/21) - too edematous for abd closure, perhaps 2/23.  NG/emesis , drain O/P .  PPI IV, PO docusate (not given) Endo: no hx DM - hypoglycemic prior to TPN initiation, CBGs now better Lytes: K+ 3.3, others WNL (Mag low normal) Renal: SCr stable, CrCL 126 ml/min - great UOP 1.5 ml/kg/hr, D5NS at 10 ml/hr Pulm: intubated, FiO2 30% Cards: angina - VSS - Lasix IV (dose increased), net negative 1.5L AC: Heparin for new left femoral DVT (dx 03/29/15) - hgb improved to 8.2, plts WNL Hepatobil:LFTs WNL, tbili 1.5.  TG 157 > 123 Neuro: Propofol >> Precedex, Fentanyl gtt - GCS 10, CPOT 0 ID: afebrile, WBC WNL - not on abx Best Practices: heparin gtt, MC TPN Access: right IJ placed 03/27/15 TPN start date: 2/21 >>  Current Nutrition:  TPN Propofol increased to 32.2 ml/hr = 850 kCal/day >> to switch to Precedex per RN Goal rate without lipid = 110 ml/hr, providing 1704 kCal and 120gm protein daily, meeting 100% of kCal and 92% of protein needs  Nutritional Goals:  1450-1890 kCal, 130-140 grams of protein per day   Plan:  - Increase Clinimix E 5/15 to 83 ml/hr (goal rate 110 ml/hr).  Clinimix providing 1414 kCal and 100gm protein per day and meeting 98% of minimal kCal and 77% of minimal protein needs. - Hold lipid emulsion in critically ill patients for the first 7 days of TPN per SCCM/ASPEN guidelines (D#2/7)  - Daily multivitamin and trace elements - Continue sensitive SSI Q6H.  D/C if CBGs remain  controlled at goal TPN rate - KCL x 5 runs.  MD checking K+ Q6H while in Lasix - Mag sulfate 2gm IV x 1 - F/U AM labs, volume status as TPN will provide 2L of fluid    Hanna Aultman D. Laney Potash, PharmD, BCPS Pager:  [phone] 04/01/2015, 8:11 AM

## 2015-04-01 NOTE — Progress Notes (Addendum)
PULMONARY / CRITICAL CARE MEDICINE   Name: Kristina Estes MRN: [id number] DOB: 06-Sep-1970    ADMISSION DATE:  03/27/2015 CONSULTATION DATE: 03/27/15  REFERRING MD: Conchita Paris  CHIEF COMPLAINT: Hemorrhagic shock  HISTORY OF PRESENT ILLNESS:  Kristina Estes is a 45 year old with history of asthma, arthritis, obesity. She had an elective admission today for L5 laminectomy. Intra-Op she went into shock, hemodynamic instability. Subsequent CT showed large left retroperitoneal hematoma. She was put on 2 pressors (norepi, epinephrine) and transferred to ICU. PCCM consulted for help with management. Vascular surgery was also consulted, underwent repair of L CIV with bypass to CFA. Intra OP TEE showed normal cardiac function.  SUBJECTIVE:  Pt is sedated, comfortably laying. Limited info as pt is sedated.   Follows simple commands.   VITAL SIGNS: BP 136/85 mmHg  Pulse 112  Temp(Src) 99 F (37.2 C) (Axillary)  Resp 25  Ht  (1.6 m)  Wt 284 lb 6.3 oz (129 kg)  BMI 50.39 kg/m2  SpO2 100%  LMP 03/27/2015  HEMODYNAMICS:    VENTILATOR SETTINGS: Vent Mode:  [-] PRVC FiO2 (%):  [30 %] 30 % Set Rate:  [20 bmp] 20 bmp Vt Set:  [450 mL] 450 mL PEEP:  [5 cmH20] 5 cmH20 Plateau Pressure:  [25 cmH20-27 cmH20] 26 cmH20  INTAKE / OUTPUT: I/O last 3 completed shifts: In: 5141.1 [I.V.:3731.8; NG/GT:60; IV Piggyback:806.7] Out: 7075 [Urine:5800; Emesis/NG output:950; Drains:325]  PHYSICAL EXAMINATION: General: Sedated, comfortably laying in bed Neuro: CN grossly intact. Pt is sedated so limited exam.  HEENT: PERLA, (+) corneals; NV difficult to assess; (+) ET tube; (-) LAD Cardiovascular: Good si/s2; (-) s3, m,r,g Lungs: Dec BS BLS. Bibasilar crackles. (-) wheeze. (-) accessory muscle use Abdomen: Dec BS. (+) open abd. (+) surgical dressing Musculoskeletal: Gr 2 edema. less edema this am.  Skin: Skin was warm and dry  LABS:  BMET  Recent Labs Lab 03/30/15 0926  03/31/15 0730 03/31/15 1820 04/01/15 0011 04/01/15 0417  NA 143 141  --   --  141  K 3.1* 2.9*  2.9* 3.8 3.5 3.3*  CL 114* 111  --   --  106  CO2 22 22  --   --  23  BUN <5* 7  --   --  6  CREATININE 0.65 0.64  --   --  0.72  GLUCOSE 98 100*  --   --  129*    Electrolytes  Recent Labs Lab 03/29/15 0407 03/30/15 0926 03/31/15 0730 04/01/15 0417  CALCIUM 7.5* 7.6* 8.0* 8.3*  MG 2.0  --  1.8 1.8  PHOS  --   --  2.2* 3.0    CBC  Recent Labs Lab 03/30/15 0338 03/31/15 0320 04/01/15 0417  WBC 10.7* 10.2 10.0  HGB 7.9* 7.8* 8.2*  HCT 23.3* 23.5* 25.0*  PLT 129* 158 188    Coag's  Recent Labs Lab 03/27/15 2205 03/27/15 2206 03/28/15 0547  APTT 37 36 30  INR 1.32 1.31 1.23    Sepsis Markers  Recent Labs Lab 03/27/15 1210  LATICACIDVEN 3.9*    ABG  Recent Labs Lab 03/28/15 0743 03/29/15 0334 03/30/15 0324  PHART 7.325* 7.424 7.455*  PCO2ART 41.4 34.7* 31.3*  PO2ART 149.0* 161.0* 133.0*    Liver Enzymes  Recent Labs Lab 03/27/15 2205 03/28/15 0547 04/01/15 0417  AST 32 39 16  ALT 20 20 13*  ALKPHOS 41 44 43  BILITOT 2.7* 1.5* 0.8  ALBUMIN 3.0* 2.9* 2.1*    Cardiac Enzymes  Recent  Labs Lab 03/27/15 1210  TROPONINI 0.11*    Glucose  Recent Labs Lab 03/31/15 0818 03/31/15 1345 03/31/15 1419 03/31/15 1750 03/31/15 2255 04/01/15 0408  GLUCAP 72 53* 103* 81 112* 123*    Imaging Dg Chest Port 1 View  04/01/2015  CLINICAL DATA:  45 year old female with ventilator dependent respiratory failure. Hemorrhagic shock. Initial encounter. EXAM: PORTABLE CHEST 1 VIEW COMPARISON:  03/28/2015 and earlier FINDINGS: Portable AP semi upright view at 0616 hours. Stable endotracheal tube tip just below the clavicles. Stable right IJ central line. Enteric tube courses to the abdomen, tip not included. Stable left IJ central line. Mildly improved lung volumes. Mediastinal contours remain normal. No pneumothorax or pulmonary edema. No pleural  effusion identified. Left greater than right lung base atelectasis or consolidation has mildly regressed since 03/28/2015. No areas of worsening ventilation. IMPRESSION: 1.  Stable lines and tubes. 2. Mildly improved ventilation since 03/28/2015 with regressed left greater than right bibasilar atelectasis/consolidation. Electronically Signed   By: Odessa Fleming M.D.   On: 04/01/2015 07:40    STUDIES:  CT abd 2/17 >> Large RP hematoma with renal, perinephric hematoma Intra op TEE 2/17 >> Normal cardiac function by verbal report.   CULTURES: MRSA screen (2/18) (-)  ANTIBIOTICS: Cefazolin 2/17 >> 2/18  SIGNIFICANT EVENTS: 2/17 - Admit for L5 laminectomy >> hemorrhagic shock from RP bleed.   LINES/TUBES: Rt IJ 2/17 >>  DISCUSSION: 45 y/o with hemorraghic shock from large RP hematoma that occurred during operation for L5 laminectomy, s/p L CIV repair and L iliac artery bypass.   ASSESSMENT / PLAN:  PULMONARY A: Acute Hypoxemic Hypercapneic Respiratory Failure 2 to Hemorrhagic Shock, Unable to Protect the Airway, Pulmonary Edema, RVD 2 to Morbid Obesity. History of asthma -- seems stable.  Possible OSA P:  --cont vent support for now. On 30% Fio2.  --will try weaning today to exercise pt. Turn off sedation. CXR with improved effusion/atelectasis/congestion.  --cont diuresis.    CARDIOVASCULAR A:  S/P Hemorrhagic Shock. Off pressors. Hb has been stable. Pulm edema -- better.  P:  --cont diuresis. Correct K, Mg.   RENAL A:  Has Volume Overload P:  --cont diuresis --replace K, Mg   GASTROINTESTINAL A:  Malnutrition, likely moderate.  P:  --Cont NPO --PUD prophylaxis --cont TPN.,   HEMATOLOGIC A:  S/P Hemorrhagic Shock with large RP bleed. Hb and hct have been stable. LUE DVT P:  --Hb and hct stable last 2days. No blood transfusion last 2d --cont heparin drip --plan for surgery tomorrow -- abdominal closure   INFECTIOUS A:  No active  infection P:  --Not on Abx. --Mentioned to nurse to contact Neurosurgery to check the dressing/sutures at the back.   ENDOCRINE A:  No issues P:  --keep BS < 180 mg% --on SSI  NEUROLOGIC A:  Post op pain P:  --RASS goal: -1 --off propofol. Will try precedex drip to faciltate weaning. --cont fentanyl drip.     FAMILY  - Updates: No family around.   - Inter-disciplinary family meet or Palliative Care meeting due by:  2/24  -- I spent 33 minutes of Critical Care time with this pt today.    Lance Sell A. Christene Slates, MD Pulmonary and Critical Care Medicine [facility] Pager: [phone] After 3 pm or if no response, call [phone]  04/01/2015, 9:15 AM

## 2015-04-02 ENCOUNTER — Inpatient Hospital Stay (HOSPITAL_COMMUNITY): Payer: PRIVATE HEALTH INSURANCE

## 2015-04-02 ENCOUNTER — Inpatient Hospital Stay (HOSPITAL_COMMUNITY): Payer: PRIVATE HEALTH INSURANCE | Admitting: Certified Registered Nurse Anesthetist

## 2015-04-02 ENCOUNTER — Encounter (HOSPITAL_COMMUNITY)
Admission: RE | Disposition: A | Discharge status: Skilled Nursing Facility | Payer: Self-pay | Source: Ambulatory Visit | Attending: Neurosurgery

## 2015-04-02 DIAGNOSIS — R509 Fever, unspecified: Secondary | ICD-10-CM

## 2015-04-02 HISTORY — PX: APPLICATION OF WOUND VAC: SHX5189

## 2015-04-02 HISTORY — PX: ABDOMINAL WOUND DEHISCENCE: SHX540

## 2015-04-02 LAB — COMPREHENSIVE METABOLIC PANEL
ALK PHOS: 44 U/L (ref 38–126)
ALT: 29 U/L (ref 14–54)
AST: 46 U/L — AB (ref 15–41)
Albumin: 2.1 g/dL — ABNORMAL LOW (ref 3.5–5.0)
Anion gap: 10 (ref 5–15)
BILIRUBIN TOTAL: 1 mg/dL (ref 0.3–1.2)
BUN: 14 mg/dL (ref 6–20)
CALCIUM: 8.5 mg/dL — AB (ref 8.9–10.3)
CO2: 24 mmol/L (ref 22–32)
CREATININE: 0.6 mg/dL (ref 0.44–1.00)
Chloride: 104 mmol/L (ref 101–111)
Glucose, Bld: 156 mg/dL — ABNORMAL HIGH (ref 65–99)
Potassium: 3.6 mmol/L (ref 3.5–5.1)
Sodium: 138 mmol/L (ref 135–145)
TOTAL PROTEIN: 5.6 g/dL — AB (ref 6.5–8.1)

## 2015-04-02 LAB — POTASSIUM
Potassium: 3.7 mmol/L (ref 3.5–5.1)
Potassium: 3.8 mmol/L (ref 3.5–5.1)
Potassium: 4 mmol/L (ref 3.5–5.1)

## 2015-04-02 LAB — GLUCOSE, CAPILLARY
GLUCOSE-CAPILLARY: 151 mg/dL — AB (ref 65–99)
GLUCOSE-CAPILLARY: 135 mg/dL — AB (ref 65–99)
GLUCOSE-CAPILLARY: 138 mg/dL — AB (ref 65–99)
GLUCOSE-CAPILLARY: 128 mg/dL — AB (ref 65–99)

## 2015-04-02 LAB — HEPARIN LEVEL (UNFRACTIONATED)
HEPARIN UNFRACTIONATED: 0.23 [IU]/mL — AB (ref 0.30–0.70)
Heparin Unfractionated: 0.41 IU/mL (ref 0.30–0.70)

## 2015-04-02 LAB — PHOSPHORUS: Phosphorus: 2.8 mg/dL (ref 2.5–4.6)

## 2015-04-02 LAB — URINALYSIS, ROUTINE W REFLEX MICROSCOPIC
Glucose, UA: NEGATIVE mg/dL
HGB URINE DIPSTICK: NEGATIVE
Ketones, ur: NEGATIVE mg/dL
NITRITE: NEGATIVE
PH: 6 (ref 5.0–8.0)
Protein, ur: NEGATIVE mg/dL
SPECIFIC GRAVITY, URINE: 1.019 (ref 1.005–1.030)

## 2015-04-02 LAB — CBC
HEMATOCRIT: 26.3 % — AB (ref 36.0–46.0)
Hemoglobin: 8.5 g/dL — ABNORMAL LOW (ref 12.0–15.0)
MCH: 28.7 pg (ref 26.0–34.0)
MCHC: 32.3 g/dL (ref 30.0–36.0)
MCV: 88.9 fL (ref 78.0–100.0)
Platelets: 226 10*3/uL (ref 150–400)
RBC: 2.96 MIL/uL — AB (ref 3.87–5.11)
RDW: 14.8 % (ref 11.5–15.5)
WBC: 11.9 10*3/uL — AB (ref 4.0–10.5)

## 2015-04-02 LAB — URINE MICROSCOPIC-ADD ON

## 2015-04-02 LAB — PROTIME-INR
INR: 1.2 (ref 0.00–1.49)
Prothrombin Time: 15.4 seconds — ABNORMAL HIGH (ref 11.6–15.2)

## 2015-04-02 LAB — MAGNESIUM: MAGNESIUM: 1.9 mg/dL (ref 1.7–2.4)

## 2015-04-02 LAB — LACTIC ACID, PLASMA: Lactic Acid, Venous: 0.8 mmol/L (ref 0.5–2.0)

## 2015-04-02 LAB — MRSA PCR SCREENING: MRSA BY PCR: NEGATIVE

## 2015-04-02 LAB — TRIGLYCERIDES: Triglycerides: 72 mg/dL (ref <150)

## 2015-04-02 SURGERY — REPAIR, DEHISCENCE, WOUND, ABDOMEN
Anesthesia: General | Site: Abdomen

## 2015-04-02 MED ORDER — MIDAZOLAM HCL 2 MG/2ML IJ SOLN
INTRAMUSCULAR | Status: AC
  Filled 2015-04-02: qty 2

## 2015-04-02 MED ORDER — TRACE MINERALS CR-CU-MN-SE-ZN 10-1000-500-60 MCG/ML IV SOLN
INTRAVENOUS | Status: AC
  Administered 2015-04-02: 18:00:00 via INTRAVENOUS
  Filled 2015-04-02: qty 2640

## 2015-04-02 MED ORDER — FENTANYL CITRATE (PF) 100 MCG/2ML IJ SOLN
INTRAMUSCULAR | Status: DC | PRN
  Administered 2015-04-02: 250 ug via INTRAVENOUS

## 2015-04-02 MED ORDER — HYDROMORPHONE HCL 1 MG/ML IJ SOLN: 0.2500 mg | INTRAMUSCULAR | Status: DC | PRN

## 2015-04-02 MED ORDER — LABETALOL HCL 5 MG/ML IV SOLN
INTRAVENOUS | Status: AC
  Filled 2015-04-02: qty 4

## 2015-04-02 MED ORDER — FENTANYL CITRATE (PF) 250 MCG/5ML IJ SOLN
INTRAMUSCULAR | Status: AC
  Filled 2015-04-02: qty 5

## 2015-04-02 MED ORDER — 0.9 % SODIUM CHLORIDE (POUR BTL) OPTIME
TOPICAL | Status: DC | PRN
  Administered 2015-04-02: 1000 mL

## 2015-04-02 MED ORDER — LIDOCAINE HCL (CARDIAC) 20 MG/ML IV SOLN
INTRAVENOUS | Status: DC | PRN
  Administered 2015-04-02: 100 mg via INTRAVENOUS

## 2015-04-02 MED ORDER — PROMETHAZINE HCL 25 MG/ML IJ SOLN: 6.2500 mg | INTRAMUSCULAR | Status: DC | PRN

## 2015-04-02 MED ORDER — PIPERACILLIN-TAZOBACTAM 3.375 G IVPB
3.3750 g | Freq: Three times a day (TID) | INTRAVENOUS | Status: DC
  Administered 2015-04-02 – 2015-04-09 (×20): 3.375 g via INTRAVENOUS
  Filled 2015-04-02 (×21): qty 50

## 2015-04-02 MED ORDER — PIPERACILLIN-TAZOBACTAM 3.375 G IVPB 30 MIN
3.3750 g | Freq: Once | INTRAVENOUS | Status: AC
  Administered 2015-04-02: 3.375 g via INTRAVENOUS
  Filled 2015-04-02: qty 50

## 2015-04-02 MED ORDER — LABETALOL HCL 5 MG/ML IV SOLN
INTRAVENOUS | Status: DC | PRN
  Administered 2015-04-02: 10 mg via INTRAVENOUS

## 2015-04-02 MED ORDER — VANCOMYCIN HCL 10 G IV SOLR
2500.0000 mg | Freq: Once | INTRAVENOUS | Status: AC
  Administered 2015-04-02: 2500 mg via INTRAVENOUS
  Filled 2015-04-02: qty 2500

## 2015-04-02 MED ORDER — PROPOFOL 10 MG/ML IV BOLUS
INTRAVENOUS | Status: AC
  Filled 2015-04-02: qty 40

## 2015-04-02 MED ORDER — VANCOMYCIN HCL 10 G IV SOLR
2250.0000 mg | Freq: Once | INTRAVENOUS | Status: DC
  Filled 2015-04-02: qty 2250

## 2015-04-02 MED ORDER — ROCURONIUM BROMIDE 100 MG/10ML IV SOLN
INTRAVENOUS | Status: DC | PRN
  Administered 2015-04-02 (×2): 50 mg via INTRAVENOUS

## 2015-04-02 MED ORDER — SODIUM CHLORIDE 0.9 % IV SOLN
INTRAVENOUS | Status: DC | PRN
  Administered 2015-04-02: 13:00:00 via INTRAVENOUS

## 2015-04-02 MED ORDER — MIDAZOLAM HCL 5 MG/5ML IJ SOLN
INTRAMUSCULAR | Status: DC | PRN
  Administered 2015-04-02: 2 mg via INTRAVENOUS

## 2015-04-02 MED ORDER — VANCOMYCIN HCL IN DEXTROSE 1-5 GM/200ML-% IV SOLN
1000.0000 mg | Freq: Two times a day (BID) | INTRAVENOUS | Status: DC
  Administered 2015-04-02: 1000 mg via INTRAVENOUS
  Administered 2015-04-02: 2500 mg via INTRAVENOUS
  Administered 2015-04-03 – 2015-04-05 (×4): 1000 mg via INTRAVENOUS
  Filled 2015-04-02 (×7): qty 200

## 2015-04-02 SURGICAL SUPPLY — 48 items
CANISTER SUCTION 2500CC (MISCELLANEOUS) ×2 IMPLANT
CLIP TI MEDIUM 24 (CLIP) IMPLANT
CLIP TI WIDE RED SMALL 24 (CLIP) IMPLANT
COVER SURGICAL LIGHT HANDLE (MISCELLANEOUS) ×2 IMPLANT
DRAPE UNIVERSAL PACK (DRAPES) ×2 IMPLANT
DRSG COVADERM 4X8 (GAUZE/BANDAGES/DRESSINGS) ×2 IMPLANT
ELECT BLADE 4.0 EZ CLEAN MEGAD (MISCELLANEOUS)
ELECT BLADE 6.5 EXT (BLADE) IMPLANT
ELECT REM PT RETURN 9FT ADLT (ELECTROSURGICAL) ×2
ELECTRODE BLDE 4.0 EZ CLN MEGD (MISCELLANEOUS) IMPLANT
ELECTRODE REM PT RTRN 9FT ADLT (ELECTROSURGICAL) ×1 IMPLANT
GLOVE BIO SURGEON STRL SZ 6.5 (GLOVE) ×4 IMPLANT
GLOVE BIO SURGEON STRL SZ7 (GLOVE) ×2 IMPLANT
GLOVE BIOGEL PI IND STRL 6.5 (GLOVE) ×3 IMPLANT
GLOVE BIOGEL PI IND STRL 7.5 (GLOVE) ×1 IMPLANT
GLOVE BIOGEL PI INDICATOR 6.5 (GLOVE) ×3
GLOVE BIOGEL PI INDICATOR 7.5 (GLOVE) ×1
GOWN STRL REUS W/ TWL LRG LVL3 (GOWN DISPOSABLE) ×3 IMPLANT
GOWN STRL REUS W/TWL LRG LVL3 (GOWN DISPOSABLE) ×3
INSERT FOGARTY 61MM (MISCELLANEOUS) IMPLANT
INSERT FOGARTY SM (MISCELLANEOUS) IMPLANT
KIT BASIN OR (CUSTOM PROCEDURE TRAY) ×2 IMPLANT
KIT REMOVER STAPLE SKIN (MISCELLANEOUS) ×2 IMPLANT
KIT ROOM TURNOVER OR (KITS) ×2 IMPLANT
NS IRRIG 1000ML POUR BTL (IV SOLUTION) ×2 IMPLANT
PACK AORTA (CUSTOM PROCEDURE TRAY) IMPLANT
PACK GENERAL/GYN (CUSTOM PROCEDURE TRAY) ×2 IMPLANT
PAD ARMBOARD 7.5X6 YLW CONV (MISCELLANEOUS) ×4 IMPLANT
RETAINER VISCERA MED (MISCELLANEOUS) IMPLANT
SPONGE ABDOMINAL VAC ABTHERA (MISCELLANEOUS) ×2 IMPLANT
SPONGE SURGIFOAM ABS GEL 100 (HEMOSTASIS) IMPLANT
STAPLER VISISTAT 35W (STAPLE) ×2 IMPLANT
SUT ETHIBOND 5 LR DA (SUTURE) IMPLANT
SUT PDS AB 1 TP1 96 (SUTURE) IMPLANT
SUT PROLENE 2 0 MH 48 (SUTURE) IMPLANT
SUT PROLENE 3 0 SH 48 (SUTURE) IMPLANT
SUT PROLENE 3 0 SH1 36 (SUTURE) IMPLANT
SUT PROLENE 5 0 C 1 24 (SUTURE) IMPLANT
SUT PROLENE 5 0 C 1 36 (SUTURE) IMPLANT
SUT SILK 2 0 (SUTURE) ×1
SUT SILK 2 0 SH CR/8 (SUTURE) IMPLANT
SUT SILK 2-0 18XBRD TIE 12 (SUTURE) ×1 IMPLANT
SUT VIC AB 2-0 CT1 36 (SUTURE) IMPLANT
SUT VIC AB 3-0 SH 27 (SUTURE)
SUT VIC AB 3-0 SH 27X BRD (SUTURE) IMPLANT
TOWEL BLUE STERILE X RAY DET (MISCELLANEOUS) IMPLANT
TRAY FOLEY W/METER SILVER 16FR (SET/KITS/TRAYS/PACK) ×2 IMPLANT
WATER STERILE IRR 1000ML POUR (IV SOLUTION) ×2 IMPLANT

## 2015-04-02 NOTE — Progress Notes (Signed)
Unable to close abd again today, started on lasix. Pt appears stable from neurologic standpoint. Cont current mgmt per vascular surg. I did speak with the patient's husband and mother. All questions were answered.

## 2015-04-02 NOTE — Progress Notes (Signed)
PULMONARY / CRITICAL CARE MEDICINE   Name: Kristina Estes MRN: [id number] DOB: February 26, 1970    ADMISSION DATE:  03/27/2015  CONSULTATION DATE: 03/27/15  REFERRING MD: Conchita Paris  CHIEF COMPLAINT: Hemorrhagic shock  HISTORY OF PRESENT ILLNESS:  Kristina Estes is a 45 year old with history of asthma, arthritis, obesity. She had an elective admission today for L5 laminectomy. Intra-Op she went into shock, hemodynamic instability. Subsequent CT showed large left retroperitoneal hematoma. She was put on 2 pressors (norepi, epinephrine) and transferred to ICU. PCCM consulted for help with management. Vascular surgery was also consulted, underwent repair of L CIV with bypass to CFA. Intra OP TEE showed normal cardiac function.  SUBJECTIVE:  Pt is  comfortably laying. Follows simple commands.  Did fair on PST in 2/22.    VITAL SIGNS: BP 109/58 mmHg  Pulse 79  Temp(Src) 100.4 F (38 C) (Axillary)  Resp 20  Ht  (1.6 m)  Wt 279 lb 1.6 oz (126.6 kg)  BMI 49.45 kg/m2  SpO2 100%  LMP 03/27/2015  HEMODYNAMICS:    VENTILATOR SETTINGS: Vent Mode:  [-] PRVC FiO2 (%):  [30 %] 30 % Set Rate:  [20 bmp] 20 bmp Vt Set:  [450 mL] 450 mL PEEP:  [5 cmH20] 5 cmH20 Plateau Pressure:  [20 cmH20-24 cmH20] 24 cmH20  INTAKE / OUTPUT: I/O last 3 completed shifts: In: 3927.7 [I.V.:1735.2; IV Piggyback:250] Out: 4970 [Urine:3950; Emesis/NG output:210; Drains:810]  PHYSICAL EXAMINATION: General: Sedated, comfortably laying in bed Neuro: CN grossly intact. Pt is sedated so limited exam.  HEENT: PERLA, (+) corneals; NV difficult to assess; (+) ET tube; (-) LAD Cardiovascular: Good si/s2; (-) s3, m,r,g Lungs: Dec BS BLS. Bibasilar crackles. (-) wheeze. (-) accessory muscle use Abdomen: Dec BS. (+) open abd. (+) surgical dressing Musculoskeletal: Gr 2 edema. less edema this am.  Skin: Skin was warm and dry   LABS:  BMET  Recent Labs Lab 03/31/15 0730  04/01/15 0417  04/01/15 1115 04/01/15 1750 04/02/15 0456  NA 141  --  141  --   --  138  K 2.9*  2.9*  < > 3.3* 3.6 4.1 3.6  CL 111  --  106  --   --  104  CO2 22  --  23  --   --  24  BUN 7  --  6  --   --  14  CREATININE 0.64  --  0.72  --   --  0.60  GLUCOSE 100*  --  129*  --   --  156*  < > = values in this interval not displayed.  Electrolytes  Recent Labs Lab 03/31/15 0730 04/01/15 0417 04/02/15 0456  CALCIUM 8.0* 8.3* 8.5*  MG 1.8 1.8 1.9  PHOS 2.2* 3.0 2.8    CBC  Recent Labs Lab 03/31/15 0320 04/01/15 0417 04/02/15 0456  WBC 10.2 10.0 11.9*  HGB 7.8* 8.2* 8.5*  HCT 23.5* 25.0* 26.3*  PLT 158 188 226    Coag's  Recent Labs Lab 03/27/15 2205 03/27/15 2206 03/28/15 0547 04/02/15 0730  APTT 37 36 30  --   INR 1.32 1.31 1.23 1.20    Sepsis Markers  Recent Labs Lab 03/27/15 1210  LATICACIDVEN 3.9*    ABG  Recent Labs Lab 03/28/15 0743 03/29/15 0334 03/30/15 0324  PHART 7.325* 7.424 7.455*  PCO2ART 41.4 34.7* 31.3*  PO2ART 149.0* 161.0* 133.0*    Liver Enzymes  Recent Labs Lab 03/28/15 0547 04/01/15 0417 04/02/15 0456  AST 39 16 46*  ALT 20 13* 29  ALKPHOS 44 43 44  BILITOT 1.5* 0.8 1.0  ALBUMIN 2.9* 2.1* 2.1*    Cardiac Enzymes  Recent Labs Lab 03/27/15 1210  TROPONINI 0.11*    Glucose  Recent Labs Lab 04/01/15 0408 04/01/15 1253 04/01/15 1545 04/01/15 2039 04/02/15 0400 04/02/15 0818  GLUCAP 123* 166* 151* 158* 151* 135*    STUDIES:  CT abd 2/17 >> Large RP hematoma with renal, perinephric hematoma Intra op TEE 2/17 >> Normal cardiac function by verbal report.   CULTURES: MRSA screen (2/18) (-)  ANTIBIOTICS: Cefazolin 2/17 >> 2/18  SIGNIFICANT EVENTS: 2/17 - Admit for L5 laminectomy >> hemorrhagic shock from RP bleed.   LINES/TUBES: Rt IJ 2/17 >>  DISCUSSION: 45 y/o with hemorraghic shock from large RP hematoma that occurred during operation for L5 laminectomy, s/p L CIV repair and L iliac artery bypass.    ASSESSMENT / PLAN:  PULMONARY A: Acute Hypoxemic Hypercapneic Respiratory Failure 2 to Hemorrhagic Shock, Unable to Protect the Airway, Pulmonary Edema, RVD 2 to Morbid Obesity. History of asthma -- seems stable.  Possible OSA P:  --cont vent support for now. On 30% Fio2. doing fair on PST. Anticipate wean in next 1-2 days if no other issues.  --cont diuresis.   CARDIOVASCULAR A:  S/P Hemorrhagic Shock. Off pressors. Hb has been stable. Pulm edema -- better.  P:  --cont diuresis. Correct K, Mg.   RENAL A:  Has Volume Overload P:  --cont diuresis --replace K, Mg   GASTROINTESTINAL A:  Malnutrition, likely moderate.  P:  --Cont NPO --PUD prophylaxis --cont TPN.,   HEMATOLOGIC A:  S/P Hemorrhagic Shock with large RP bleed. Hb and hct have been stable. LUE DVT P:  --Hb and hct stable last 2days. No blood transfusion last 2d --cont heparin drip --plan for surgery today-- abdominal closure   INFECTIOUS A:  No active infection. Starting to have fever last 24 hrs.  P:  --Will dose zosyn and vanc x 1. --plan for cultures -- blood, sputum, U/A. Check CXR.  --check lactic acid  ENDOCRINE A:  No issues P:  --keep BS < 180 mg% --on SSI  NEUROLOGIC A:  Post op pain P:  --RASS goal: -1 -- comfortable on precedex drip.  --cont fentanyl drip.     FAMILY  - Updates: family updated.   - Inter-disciplinary family meet or Palliative Care meeting due by: 2/24  -- I spent 30 minutes of Critical Care time with this pt today.      Lance Sell A. Christene Slates, MD Pulmonary and Critical Care Medicine [facility] Pager: [phone] After 3 pm or if no response, call [phone]  04/02/2015, 9:46 AM

## 2015-04-02 NOTE — Progress Notes (Addendum)
ANTICOAGULATION CONSULT NOTE - Follow Up Consult  Pharmacy Consult for Heparin  Indication: DVT  Allergies  Allergen Reactions  . Propoxyphene Other (See Comments)    Unspecified    Patient Measurements: Height:  (160 cm) Weight: 279 lb 1.6 oz (126.6 kg) IBW/kg (Calculated) : 52.4 Heparin dosing wt: 84.5kg  Vital Signs: Temp: 100.4 F (38 C) (02/23 0824) Temp Source: Axillary (02/23 0824) Pulse Rate: 79 (02/23 0900)  Labs:  Recent Labs  03/31/15 0320 03/31/15 0730 04/01/15 0417  04/01/15 1115 04/01/15 1956 04/02/15 0456 04/02/15 0730  HGB 7.8*  --  8.2*  --   --   --  8.5*  --   HCT 23.5*  --  25.0*  --   --   --  26.3*  --   PLT 158  --  188  --   --   --  226  --   LABPROT  --   --   --   --   --   --   --  15.4*  INR  --   --   --   --   --   --   --  1.20  HEPARINUNFRC 0.39  --   --   < > 0.40 0.16* 0.41  --   CREATININE  --  0.64 0.72  --   --   --  0.60  --   < > = values in this interval not displayed.  Estimated Creatinine Clearance: 115.1 mL/min (by C-G formula based on Cr of 0.6).  Assessment: 45 y.o female with hemorraghic shock from large RP hematoma that occurred during elective operation. Found to have a DVT. Continuing on heparin per pharmacy. Ok to bolus per Dr. Imogene Burn.  HL therapeutic x1 (0.4) on 1650 units/h. No further bleed noted. Hgb low but stable, pltc up to wnl.  Goal of Therapy:  Heparin level 0.3-0.7 units/ml Monitor platelets by anticoagulation protocol: Yes   Plan:  Heparin at 1950 units/h - f/u restart post-OR Daily CBC/HL Monitor for bleeding Switch to lovenox when abdomen closed F/u long-term AC plans after all OR trips completed, hopeful to close 2/23  Babs Bertin, PharmD, BCPS Clinical Pharmacist Pager [phone] 04/02/2015 10:17 AM  ADDENDUM:  Spoke with Dr. Imogene Burn - ok to restart heparin post-op. Communicated with RN to restart ~1510. Will f/u 6h heparin level.  Plan: Restart heparin at previous rate 1950  units/h 6h HL, daily HL/CBC Mon s/sx bleeding  Babs Bertin, PharmD, [facility] Clinical Pharmacist Pager [phone] 04/02/2015 3:08 PM

## 2015-04-02 NOTE — Transfer of Care (Signed)
Immediate Anesthesia Transfer of Care Note  Patient: Kristina Estes  Procedure(s) Performed: Procedure(s): ABDOMINAL WASHOUT (N/A) APPLICATION OF WOUND VAC (N/A)  Patient Location: SICU  Anesthesia Type:General  Level of Consciousness: sedated, responds to stimulation and Patient remains intubated per anesthesia plan  Airway & Oxygen Therapy: Patient remains intubated per anesthesia plan and Patient placed on Ventilator (see vital sign flow sheet for setting)  Post-op Assessment: Report given to RN and Post -op Vital signs reviewed and stable  Post vital signs: Reviewed and stable  Last Vitals:  Filed Vitals:   04/02/15 1200 04/02/15 1221  BP:    Pulse: 82   Temp:  38.2 C  Resp: 20     Complications: No apparent anesthesia complications

## 2015-04-02 NOTE — Op Note (Signed)
    OPERATIVE NOTE   PROCEDURE: 1. Abdominal negative pressure dressing change  PRE-OPERATIVE DIAGNOSIS: open abdomen  POST-OPERATIVE DIAGNOSIS: same as above   SURGEON: Leonides Sake, MD  ASSISTANT(S): Karsten Ro, PAC   ANESTHESIA: general  ESTIMATED BLOOD LOSS: 30 cc  FINDING(S): 1. Increased intraperitoneal fluid 2. Increased intestinal distension with stool in colons 3. Unable to close fascia over intestines  SPECIMEN(S): none  INDICATIONS:  Kristina Estes is a 45 y.o. female who presents with open abdomen after a massive resuscitation after a iliac vessel injury during a spine procedure. She returns today for washout of the abdomen, possible abdominal VAC placement, and possible closure of fascia.   DESCRIPTION: After obtaining full informed written consent, the patient was brought back to the operating room and placed supine upon the operating table. After obtaining adequate anesthesia, the patient was prepped and draped in the standard fashion for: exploratory laparotomy.  Her previous abdominal VAC dressing had been removed except for the interior drape prior to prepping. I removed the interior drape. The bowels appeared markedly distended today compared to last time.  I placed Kocher clamps on the fascia on each side and tried to reapproximate the fascia over the bowels. This was impossible to reapproximate the fascia given the substantial bowel distension.  Additionally, there was more intraperitoneal fluid present.  At this point, I obtained the abdominal VAC pack and fashioned the interior drape for the bowels.  I packed this in over the bowels.  I then applied two layers of sponge over the drape.  These sponges were affixed with adhesive strips.  I then cut a hole in the center drape and applied the lilypad.  The lilypad was connected to the [facility] circuit which began suction at 125 continuous.  The VAC sponge became adherent.  She will need continue diuresis over  the weekend.  Will try to close again on Monday.   COMPLICATIONS: none  CONDITION: stable  Leonides Sake, MD, [facility] [place] Office: [phone] Pager: [phone]  04/02/2015, 2:11 PM

## 2015-04-02 NOTE — Interval H&P Note (Signed)
History and Physical Interval Note:  04/02/2015 9:24 AM  Kristina Estes  has presented today for surgery, with the diagnosis of Open abdomen S31.109A  The various methods of treatment have been discussed with the patient and family. After consideration of risks, benefits and other options for treatment, the patient has consented to  Procedure(s): ABDOMINAL WASHOUT; POSSIBLE ABDOMINAL CLOSURE (N/A) APPLICATION OF WOUND VAC (N/A) as a surgical intervention .  The patient's history has been reviewed, patient examined, no change in status, stable for surgery.  I have reviewed the patient's chart and labs.  Questions were answered to the patient's satisfaction.     Leonides Sake

## 2015-04-02 NOTE — Progress Notes (Signed)
PARENTERAL NUTRITION CONSULT NOTE - FOLLOW UP  Pharmacy Consult:  TPN Indication:  Open abdomen  Allergies  Allergen Reactions  . Propoxyphene Other (See Comments)    Unspecified    Patient Measurements: Height:  (160 cm) Weight: 279 lb 1.6 oz (126.6 kg) IBW/kg (Calculated) : 52.4  BMI = 59.7  Vital Signs: Temp: 100.4 F (38 C) (02/23 0824) Temp Source: Axillary (02/23 0824) BP: 109/58 mmHg (02/22 2045) Pulse Rate: 89 (02/23 0600) Intake/Output from previous day: 02/22 0701 - 02/23 0700 In: 2629.9 [I.V.:917.4; IV Piggyback:250; TPN:1462.5] Out: 3720 [Urine:2700; Emesis/NG output:210; Drains:810]  Labs:  Recent Labs  03/31/15 0320 04/01/15 0417 04/02/15 0456 04/02/15 0730  WBC 10.2 10.0 11.9*  --   HGB 7.8* 8.2* 8.5*  --   HCT 23.5* 25.0* 26.3*  --   PLT 158 188 226  --   INR  --   --   --  1.20     Recent Labs  03/31/15 0730  04/01/15 0417 04/01/15 1115 04/01/15 1750 04/02/15 0455 04/02/15 0456  NA 141  --  141  --   --   --  138  K 2.9*  2.9*  < > 3.3* 3.6 4.1  --  3.6  CL 111  --  106  --   --   --  104  CO2 22  --  23  --   --   --  24  GLUCOSE 100*  --  129*  --   --   --  156*  BUN 7  --  6  --   --   --  14  CREATININE 0.64  --  0.72  --   --   --  0.60  CALCIUM 8.0*  --  8.3*  --   --   --  8.5*  MG 1.8  --  1.8  --   --   --  1.9  PHOS 2.2*  --  3.0  --   --   --  2.8  PROT  --   --  5.2*  --   --   --  5.6*  ALBUMIN  --   --  2.1*  --   --   --  2.1*  AST  --   --  16  --   --   --  46*  ALT  --   --  13*  --   --   --  29  ALKPHOS  --   --  43  --   --   --  44  BILITOT  --   --  0.8  --   --   --  1.0  PREALBUMIN  --   --  8.4*  --   --   --   --   TRIG  --   --   --   --   --  72  --   < > = values in this interval not displayed. Estimated Creatinine Clearance: 115.1 mL/min (by C-G formula based on Cr of 0.6).    Recent Labs  04/01/15 1545 04/01/15 2039 04/02/15 0400  GLUCAP 151* 158* 151*     Insulin Requirements in  the past 24 hours:  8 units SSI  Assessment: 44 YOF admitted on 03/27/15 with back and leg pain and underwent L5 laminectomy with facetectomy later that day.  Intraoperatively patient went into shock and CT revealed large left RP hematoma.  Patient returned to the OR  for ex-lap with repair of left iliac vein, ligation of distal left CIA, left CIA-CFA bypass, and placement of abdominal VAC.  Pharmacy consulted to manage TPN for nutritional support.  GI: hx GERD / obesity.  Baseline prealbumin low at 8.4.  Frequent OR visits for abd washout (2/19, 2/21) - too edematous for abd closure, probable 2/23 (spoke with Dr Imogene Burn who says ok to increase to goal TPN rate).  NG/emesis decreasing, now , drain O/P increasing to .  PPI IV, PO docusate (not given) Endo: no hx DM - hypoglycemic prior to TPN initiation, CBGs now better Lytes: lytes WNL Renal: SCr stable, CrCL > 100 ml/min - UOP 0.9 ml/kg/hr, D5NS at 10 ml/hr Pulm: intubated, FiO2 30% Cards: angina - VSS - Lasix IV (dose increased), net negative 1L AC: Heparin for new left femoral DVT (dx 03/29/15) - hgb improved to 8.5, plts WNL Hepatobil:LFTs WNL, tbili 1.  TG 157 > 123 > 72 Neuro: Propofol >> Precedex, Fentanyl gtt - GCS 13, CPOT 0 ID: tmax 100.4, WBC 11.9 - not on abx (now with pre-op abx)  Best Practices: heparin gtt, MC TPN Access: right IJ placed 03/27/15 TPN start date: 2/21 >>  Current Nutrition:  TPN  Nutritional Goals:  1450-1890 kCal, 130-140 grams of protein per day  Plan:  - Increase Clinimix E 5/15 to 110 ml/hr (goal rate 110 ml/hr).  Clinimix providing 1874 kCal and 132gm protein per day and meeting 100% of needs. - Hold lipid emulsion in critically ill patients for the first 7 days of TPN per SCCM/ASPEN guidelines (D#3/7)  - Daily multivitamin and trace elements - Continue sensitive SSI Q6H.  D/C if CBGs remain controlled at goal TPN rate - F/U AM labs, volume status as TPN will provide 2.5L of fluid  Isaac Bliss, PharmD, BCPS, [facility] Clinical Pharmacist Pager [phone] 04/02/2015 8:56 AM

## 2015-04-02 NOTE — Progress Notes (Signed)
ANTICOAGULATION CONSULT NOTE - Follow Up Consult  Pharmacy Consult for Heparin  Indication: DVT  Allergies  Allergen Reactions  . Propoxyphene Other (See Comments)    Unspecified    Patient Measurements: Height:  (160 cm) Weight: 279 lb 1.6 oz (126.6 kg) IBW/kg (Calculated) : 52.4 Heparin dosing wt: 84.5kg  Vital Signs: Temp: 100.3 F (37.9 C) (02/23 1937) Temp Source: Oral (02/23 1937) BP: 146/62 mmHg (02/23 1929) Pulse Rate: 89 (02/23 2200)  Labs:  Recent Labs  03/31/15 0320 03/31/15 0730 04/01/15 0417  04/01/15 1956 04/02/15 0456 04/02/15 0730 04/02/15 2140  HGB 7.8*  --  8.2*  --   --  8.5*  --   --   HCT 23.5*  --  25.0*  --   --  26.3*  --   --   PLT 158  --  188  --   --  226  --   --   LABPROT  --   --   --   --   --   --  15.4*  --   INR  --   --   --   --   --   --  1.20  --   HEPARINUNFRC 0.39  --   --   < > 0.16* 0.41  --  0.23*  CREATININE  --  0.64 0.72  --   --  0.60  --   --   < > = values in this interval not displayed.  Estimated Creatinine Clearance: 115.1 mL/min (by C-G formula based on Cr of 0.6).  Assessment: 45 y.o female with hemorraghic shock from large RP hematoma that occurred during elective operation. Found to have a DVT. Continuing on heparin per pharmacy. Ok to bolus per Dr. Imogene Burn.  Heparin was off earlier today for OR, resumed around 1500 at rate of 1950 units/hr.  HL drawn 6 hrs later slightly low at 0.23.  No bleeding noted.   Goal of Therapy:  Heparin level 0.3-0.7 units/ml Monitor platelets by anticoagulation protocol: Yes   Plan:  - increase heparin to 2100 units/hr and check 6 hr HL at 0500 am - daily HL/CBC  Herby Abraham, Pharm.D. [phone] 04/02/2015 10:42 PM

## 2015-04-02 NOTE — Anesthesia Postprocedure Evaluation (Signed)
Anesthesia Post Note  Patient: Kristina Estes  Procedure(s) Performed: Procedure(s) (LRB): ABDOMINAL WASHOUT (N/A) APPLICATION OF WOUND VAC (N/A)  Patient location during evaluation: SICU Anesthesia Type: General Level of consciousness: sedated Pain management: pain level controlled Vital Signs Assessment: post-procedure vital signs reviewed and stable Respiratory status: patient remains intubated per anesthesia plan Cardiovascular status: stable Anesthetic complications: no    Last Vitals:  Filed Vitals:   04/02/15 1200 04/02/15 1221  BP:    Pulse: 82   Temp:  38.2 C  Resp: 20     Last Pain:  Filed Vitals:   04/02/15 1442  PainSc: Asleep                 Makailyn Mccormick,W. EDMOND

## 2015-04-02 NOTE — Anesthesia Postprocedure Evaluation (Signed)
Anesthesia Post Note  Patient: Kristina Estes  Procedure(s) Performed: Procedure(s) (LRB): ABDOMINAL WASHOUT; POSSIBLE ABDOMINAL CLOSURE (N/A) APPLICATION OF WOUND VAC (N/A)  Patient location during evaluation: SICU Anesthesia Type: General Level of consciousness: sedated and patient remains intubated per anesthesia plan Pain management: pain level controlled Vital Signs Assessment: post-procedure vital signs reviewed and stable Respiratory status: patient remains intubated per anesthesia plan and patient on ventilator - see flowsheet for VS Cardiovascular status: blood pressure returned to baseline and stable Anesthetic complications: no    Last Vitals:  Filed Vitals:   04/02/15 1700 04/02/15 1800  BP:    Pulse: 85 87  Temp:    Resp: 21 21    Last Pain:  Filed Vitals:   04/02/15 1836  PainSc: Asleep                 Eshaan Titzer,E. Tine Mabee

## 2015-04-02 NOTE — Progress Notes (Signed)
ANTICOAGULATION CONSULT NOTE - Follow Up Consult  Pharmacy Consult for Heparin  Indication: DVT  Allergies  Allergen Reactions  . Propoxyphene Other (See Comments)    Unspecified    Patient Measurements: Height:  (160 cm) Weight: 279 lb 1.6 oz (126.6 kg) IBW/kg (Calculated) : 52.4 Heparin dosing wt: 84.5kg  Vital Signs: Temp: 100.4 F (38 C) (02/23 0824) Temp Source: Axillary (02/23 0824) BP: 109/58 mmHg (02/22 2045) Pulse Rate: 89 (02/23 0600)  Labs:  Recent Labs  03/31/15 0320 03/31/15 0730 04/01/15 0417  04/01/15 1115 04/01/15 1956 04/02/15 0456 04/02/15 0730  HGB 7.8*  --  8.2*  --   --   --  8.5*  --   HCT 23.5*  --  25.0*  --   --   --  26.3*  --   PLT 158  --  188  --   --   --  226  --   LABPROT  --   --   --   --   --   --   --  15.4*  INR  --   --   --   --   --   --   --  1.20  HEPARINUNFRC 0.39  --   --   < > 0.40 0.16* 0.41  --   CREATININE  --  0.64 0.72  --   --   --  0.60  --   < > = values in this interval not displayed.  Estimated Creatinine Clearance: 115.1 mL/min (by C-G formula based on Cr of 0.6).  Assessment: 45 y.o female with hemorraghic shock from large RP hematoma that occurred during elective operation. Found to have a DVT. Continuing on heparin per pharmacy. Ok to bolus per Dr. Imogene Burn.  HL therapeutic x1 (0.4) on 1650 units/h. No further bleed noted. Hgb low but stable, pltc up to wnl.  Goal of Therapy:  Heparin level 0.3-0.7 units/ml Monitor platelets by anticoagulation protocol: Yes   Plan:  Heparin at 1950 units/h 6h HL to confirm, Daily CBC/HL Monitor for bleeding Switch to lovenox when abdomen closed F/u long-term AC plans after all OR trips completed, hopeful to close 2/23  Babs Bertin, PharmD, [facility] Clinical Pharmacist Pager [phone] 04/02/2015 8:32 AM

## 2015-04-02 NOTE — Progress Notes (Signed)
   Daily Progress Note  I had 45 minute discussion with the patient's husband and family.  I have been calling the husband only as he was the only one listed on the facesheet.  This has been updated to reflect the other family members.  We discussed her currently condition and the longitudinal plan.  - Will plan return to OR for washout, possible VAC placement, and possible abdominal closure.    - Will get General Surgery to peeks in also  - Will need continued diuresis and bowel prep to try to get best possible chance of abdominal closure   Leonides Sake, MD [facility] [place] Office: [phone] Pager: [phone]  04/02/2015, 5:00 PM

## 2015-04-02 NOTE — Progress Notes (Signed)
eLink Physician-Brief Progress Note Patient Name: Kristina Estes DOB: [birth year] MRN: [id number]   Date of Service  04/02/2015  HPI/Events of Note  Nursing requesting restraint renewal  eICU Interventions  Renewed restraints     Intervention Category Minor Interventions: Routine modifications to care plan (e.g. PRN medications for pain, fever)  Sandrea Hughs 04/02/2015, 10:37 PM

## 2015-04-02 NOTE — Anesthesia Preprocedure Evaluation (Addendum)
Anesthesia Evaluation  Patient identified by MRN, date of birth, ID band Patient unresponsive    Reviewed: Allergy & Precautions, NPO status , Patient's Chart, lab work & pertinent test results, Unable to perform ROS - Chart review only  Airway Mallampati: Intubated  TM Distance: >3 FB Neck ROM: Full    Dental no notable dental hx.    Pulmonary neg pulmonary ROS,    breath sounds clear to auscultation+ rhonchi        Cardiovascular Normal cardiovascular exam+ dysrhythmias  Rhythm:Regular Rate:Normal     Neuro/Psych negative neurological ROS  negative psych ROS   GI/Hepatic negative GI ROS, Neg liver ROS,   Endo/Other  Morbid obesity  Renal/GU negative Renal ROS  negative genitourinary   Musculoskeletal negative musculoskeletal ROS (+)   Abdominal   Peds negative pediatric ROS (+)  Hematology negative hematology ROS (+)   Anesthesia Other Findings   Reproductive/Obstetrics negative OB ROS                            Anesthesia Physical Anesthesia Plan  ASA: III  Anesthesia Plan: General   Post-op Pain Management:    Induction: Inhalational  Airway Management Planned: Oral ETT  Additional Equipment:   Intra-op Plan:   Post-operative Plan: Post-operative intubation/ventilation  Informed Consent: I have reviewed the patients History and Physical, chart, labs and discussed the procedure including the risks, benefits and alternatives for the proposed anesthesia with the patient or authorized representative who has indicated his/her understanding and acceptance.   Dental advisory given  Plan Discussed with: CRNA and Surgeon  Anesthesia Plan Comments:         Anesthesia Quick Evaluation

## 2015-04-02 NOTE — Progress Notes (Signed)
Pharmacy Antibiotic Note  Kristina Estes is a 45 y.o. female admitted on 03/27/2015 with r/o HAP/abdominal infxn.  Pharmacy has been consulted for vanc/zosyn dosing - day #1. Tmax/24h 100.4, wbc 11.9 (up). SCr stable 0.6, normalized CrCl~80, good UOP. Scheduled for abdominal washout today to hopefully close abdomen.  Plan: Vanc  IV x1; then Vanc 1g IV q12h Zosyn 3.375g IV ( inf); then 3.375g IV q8h (4h inf) Monitor clinical progress, c/s, renal function, abx plan/LOT VT@SS  as indicated  Height:  (160 cm) Weight: 279 lb 1.6 oz (126.6 kg) IBW/kg (Calculated) : 52.4  Temp (24hrs), Avg:99.3 F (37.4 C), Min:97.3 F (36.3 C), Max:100.4 F (38 C)   Recent Labs Lab 03/27/15 1210  03/29/15 0407 03/29/15 1937 03/30/15 0338 03/30/15 0926 03/31/15 0320 03/31/15 0730 04/01/15 0417 04/02/15 0456  WBC 7.9  < > 8.8 10.9* 10.7*  --  10.2  --  10.0 11.9*  CREATININE 0.94  < > 0.62  --   --  0.65  --  0.64 0.72 0.60  LATICACIDVEN 3.9*  --   --   --   --   --   --   --   --   --   < > = values in this interval not displayed.  Estimated Creatinine Clearance: 115.1 mL/min (by C-G formula based on Cr of 0.6).    Allergies  Allergen Reactions  . Propoxyphene Other (See Comments)    Unspecified    Antimicrobials this admission: 2/23 vanc>> 2/23 zosyn>>  Dose adjustments this admission: n/a  Microbiology results: 2/23 BCx2>> 2/23 sputum>> MRSA pcr: neg  Babs Bertin, PharmD, BCPS Clinical Pharmacist Pager [phone] 04/02/2015 10:10 AM

## 2015-04-02 NOTE — Anesthesia Procedure Notes (Signed)
Date/Time: 04/02/2015 1:30 PM Performed by: Wray Kearns A Pre-anesthesia Checklist: Patient identified, Timeout performed, Emergency Drugs available, Suction available and Patient being monitored Patient Re-evaluated:Patient Re-evaluated prior to inductionOxygen Delivery Method: Circle system utilized Preoxygenation: Pre-oxygenation with 100% oxygen Intubation Type: Combination inhalational/ intravenous induction and Inhalational induction with existing ETT Tube type: Oral Placement Confirmation: breath sounds checked- equal and bilateral and positive ETCO2 Tube secured with: Tape Dental Injury: Teeth and Oropharynx as per pre-operative assessment

## 2015-04-02 NOTE — Care Management Note (Addendum)
Case Management Note  Patient Details  Name: Kristina Estes MRN: [id number] Date of Birth: 1970-03-10  Subjective/Objective:     Pt lives with spouse, was partially immobile due to back and leg pain.  Currently on vent and back to OR for abdominal washout and possible closure.  CM and SW will continue to follow.                       Expected Discharge Plan:   SNF for rehab  Discharge planning Services   CM, CSW consult  Nika Yazzie, [place], [place] 04/02/2015, 12:49 PM

## 2015-04-03 ENCOUNTER — Encounter (HOSPITAL_COMMUNITY): Payer: Self-pay | Admitting: Vascular Surgery

## 2015-04-03 LAB — BASIC METABOLIC PANEL
Anion gap: 5 (ref 5–15)
BUN: 17 mg/dL (ref 6–20)
CHLORIDE: 105 mmol/L (ref 101–111)
CO2: 24 mmol/L (ref 22–32)
CREATININE: 0.78 mg/dL (ref 0.44–1.00)
Calcium: 8 mg/dL — ABNORMAL LOW (ref 8.9–10.3)
GFR calc Af Amer: >60 mL/min (ref >60)
GFR calc non Af Amer: >60 mL/min (ref >60)
Glucose, Bld: 185 mg/dL — ABNORMAL HIGH (ref 65–99)
POTASSIUM: 4 mmol/L (ref 3.5–5.1)
SODIUM: 134 mmol/L — AB (ref 135–145)

## 2015-04-03 LAB — GLUCOSE, CAPILLARY
GLUCOSE-CAPILLARY: 148 mg/dL — AB (ref 65–99)
GLUCOSE-CAPILLARY: 167 mg/dL — AB (ref 65–99)
GLUCOSE-CAPILLARY: 199 mg/dL — AB (ref 65–99)
Glucose-Capillary: 144 mg/dL — ABNORMAL HIGH (ref 65–99)
Glucose-Capillary: 168 mg/dL — ABNORMAL HIGH (ref 65–99)
Glucose-Capillary: 190 mg/dL — ABNORMAL HIGH (ref 65–99)

## 2015-04-03 LAB — CBC
HEMATOCRIT: 26.9 % — AB (ref 36.0–46.0)
HEMOGLOBIN: 9 g/dL — AB (ref 12.0–15.0)
MCH: 30.5 pg (ref 26.0–34.0)
MCHC: 33.5 g/dL (ref 30.0–36.0)
MCV: 91.2 fL (ref 78.0–100.0)
Platelets: 277 10*3/uL (ref 150–400)
RBC: 2.95 MIL/uL — AB (ref 3.87–5.11)
RDW: 14.6 % (ref 11.5–15.5)
WBC: 15.3 10*3/uL — ABNORMAL HIGH (ref 4.0–10.5)

## 2015-04-03 LAB — HEPARIN LEVEL (UNFRACTIONATED)
Heparin Unfractionated: 0.27 IU/mL — ABNORMAL LOW (ref 0.30–0.70)
Heparin Unfractionated: 0.4 IU/mL (ref 0.30–0.70)

## 2015-04-03 LAB — POTASSIUM
POTASSIUM: 4.1 mmol/L (ref 3.5–5.1)
Potassium: 3.4 mmol/L — ABNORMAL LOW (ref 3.5–5.1)
Potassium: 4.1 mmol/L (ref 3.5–5.1)

## 2015-04-03 MED ORDER — FUROSEMIDE 10 MG/ML IJ SOLN: 20.0000 mg | Freq: Every day | INTRAMUSCULAR | Status: DC

## 2015-04-03 MED ORDER — POTASSIUM CHLORIDE 10 MEQ/50ML IV SOLN
INTRAVENOUS | Status: AC
  Filled 2015-04-03: qty 50

## 2015-04-03 MED ORDER — POTASSIUM CHLORIDE 10 MEQ/50ML IV SOLN
10.0000 meq | INTRAVENOUS | Status: AC
  Administered 2015-04-03 (×2): 10 meq via INTRAVENOUS
  Filled 2015-04-03: qty 50

## 2015-04-03 MED ORDER — METOCLOPRAMIDE HCL 5 MG/5ML PO SOLN
10.0000 mg | Freq: Four times a day (QID) | ORAL | Status: DC
  Administered 2015-04-03 – 2015-04-12 (×34): 10 mg via ORAL
  Filled 2015-04-03 (×40): qty 10

## 2015-04-03 MED ORDER — INSULIN ASPART 100 UNIT/ML ~~LOC~~ SOLN
0.0000 [IU] | Freq: Four times a day (QID) | SUBCUTANEOUS | Status: DC
  Administered 2015-04-03: 2 [IU] via SUBCUTANEOUS
  Administered 2015-04-03 – 2015-04-04 (×3): 3 [IU] via SUBCUTANEOUS
  Administered 2015-04-04: 2 [IU] via SUBCUTANEOUS
  Administered 2015-04-05: 3 [IU] via SUBCUTANEOUS
  Administered 2015-04-05 (×2): 2 [IU] via SUBCUTANEOUS
  Administered 2015-04-05 – 2015-04-06 (×2): 3 [IU] via SUBCUTANEOUS
  Administered 2015-04-06: 2 [IU] via SUBCUTANEOUS

## 2015-04-03 MED ORDER — TRACE MINERALS CR-CU-MN-SE-ZN 10-1000-500-60 MCG/ML IV SOLN
INTRAVENOUS | Status: AC
  Administered 2015-04-03: 17:00:00 via INTRAVENOUS
  Filled 2015-04-03: qty 2640

## 2015-04-03 MED ORDER — FUROSEMIDE 10 MG/ML IJ SOLN
20.0000 mg | Freq: Two times a day (BID) | INTRAMUSCULAR | Status: DC
  Administered 2015-04-03 – 2015-04-05 (×6): 20 mg via INTRAVENOUS
  Filled 2015-04-03 (×6): qty 2

## 2015-04-03 NOTE — Progress Notes (Addendum)
[facility] [place]  Subjective  - Intubated opens eyes.   Objective 112/70 94 100.5 F (38.1 C) (Oral) 21 98%  Intake/Output Summary (Last 24 hours) at 04/03/15 0721 Last data filed at 04/03/15 0600  Gross per 24 hour  Intake 4664.53 ml  Output   4260 ml  Net 404.53 ml    Abdominal wound vac in place Left groin soft Decreased edema left LE  Assessment/Planning: POD # 7  PROCEDURE: 1. Exploratory laparotomy 2. Repair of left iliac vein (dictated by Dr. Arbie Cookey) 3. Ligation of distal left common iliac artery (dictated by Dr. Arbie Cookey) 4. Left common iliac artery to left common femoral artery bypass with 8 mm Dacron 5. Placement of abdominal negative pressure dressing  Followed by multiple wash outs and attempts to close abdomin.   04/02/2015: OR FINDING(S): 1. Increased intraperitoneal fluid 2. Increased intestinal distension with stool in colons 3. Unable to close fascia over intestines  Recommend bowel regimen to reduce stool and air.  Will plan return to OR Monday for abdominal closure.   Daily left groin dry dressing changes if the left groin drains Dr. Imogene Burn needs to be notified and an incisional wound vac should be placed.  Decreased urine output with decrease lasix dosing.  Clinton Gallant [facility] 04/03/2015 7:21 AM --  Laboratory Lab Results:  Recent Labs  04/02/15 0456 04/03/15 0500  WBC 11.9* 15.3*  HGB 8.5* 9.0*  HCT 26.3* 26.9*  PLT 226 277   BMET  Recent Labs  04/02/15 0456  04/02/15 2355 04/03/15 0500  NA 138  --   --  134*  K 3.6  < > 3.4* 4.0  CL 104  --   --  105  CO2 24  --   --  24  GLUCOSE 156*  --   --  185*  BUN 14  --   --  17  CREATININE 0.60  --   --  0.78  CALCIUM 8.5*  --   --  8.0*  < > = values in this interval not displayed.  COAG Lab Results  Component Value Date   INR 1.20 04/02/2015   INR 1.23 03/28/2015   INR 1.31 03/27/2015   No results found for: PTT  Addendum  I have  independently interviewed and examined the patient, and I agree with the physician assistant's findings.  Soft BP with decreasing response to lasix.  Decreased interval down to daily to let her intravascular volume recover.  Will need some type of promotility agent to get this patient to evacuate her bowels to have any chance of closing her.  Her bowel distension with stool throughout colon prevented closure last time.  Will try again on Monday to close her.  Will get Gen Surgery to peek in on Monday.   Leonides Sake, MD [facility] [place] Office: [phone] Pager: [phone]  04/03/2015, 8:35 AM

## 2015-04-03 NOTE — Progress Notes (Addendum)
PULMONARY / CRITICAL CARE MEDICINE   Name: Kristina Estes MRN: [id number] DOB: 1970/06/16    ADMISSION DATE:  03/27/2015  CONSULTATION DATE: 03/27/15  REFERRING MD: Conchita Paris  CHIEF COMPLAINT: Hemorrhagic shock  HISTORY OF PRESENT ILLNESS:  Kristina Estes is a 45 year old with history of asthma, arthritis, obesity. She had an elective admission today for L5 laminectomy. Intra-Op she went into shock, hemodynamic instability. Subsequent CT showed large left retroperitoneal hematoma. She was put on 2 pressors (norepi, epinephrine) and transferred to ICU. PCCM consulted for help with management. Vascular surgery was also consulted, underwent repair of L CIV with bypass to CFA. Intra OP TEE showed normal cardiac function.  SUBJECTIVE:  Pt doing fair on daily wean trial to exercise her. Comfortable. (-) inc in ET secretions.  Had OR 12/23 -- lots of edema and stool.  Low grade fever.   VITAL SIGNS: BP 104/57 mmHg  Pulse 88  Temp(Src) 99.6 F (37.6 C) (Oral)  Resp 21  Ht  (1.6 m)  Wt 298 lb (135.172 kg)  BMI 52.80 kg/m2  SpO2 99%  LMP 03/27/2015  HEMODYNAMICS:    VENTILATOR SETTINGS: Vent Mode:  [-] PRVC FiO2 (%):  [30 %] 30 % Set Rate:  [20 bmp] 20 bmp Vt Set:  [450 mL] 450 mL PEEP:  [5 cmH20] 5 cmH20 Plateau Pressure:  [11 cmH20-27 cmH20] 27 cmH20  INTAKE / OUTPUT: I/O last 3 completed shifts: In: 6245.4 [I.V.:2171.4; NG/GT:90; IV Piggyback:950] Out: 5880 [Urine:4385; Emesis/NG output:485; Drains:1010]   PHYSICAL EXAMINATION: General: Sedated, comfortably laying in bed Neuro: CN grossly intact. Pt is sedated so limited exam.  HEENT: PERLA, (+) corneals; NV difficult to assess; (+) ET tube; (-) LAD Cardiovascular: Good si/s2; (-) s3, m,r,g Lungs: Dec BS BLS. Bibasilar crackles. (-) wheeze. (-) accessory muscle use Abdomen: Dec BS. (+) open abd. (+) surgical dressing. Very diminished BS.  Musculoskeletal: Gr 2 edema. less edema this am.  Skin: Skin  was warm and dry  LABS:  BMET  Recent Labs Lab 04/01/15 0417  04/02/15 0456  04/02/15 1800 04/02/15 2355 04/03/15 0500  NA 141  --  138  --   --   --  134*  K 3.3*  < > 3.6  < > 4.0 3.4* 4.0  CL 106  --  104  --   --   --  105  CO2 23  --  24  --   --   --  24  BUN 6  --  14  --   --   --  17  CREATININE 0.72  --  0.60  --   --   --  0.78  GLUCOSE 129*  --  156*  --   --   --  185*  < > = values in this interval not displayed.  Electrolytes  Recent Labs Lab 03/31/15 0730 04/01/15 0417 04/02/15 0456 04/03/15 0500  CALCIUM 8.0* 8.3* 8.5* 8.0*  MG 1.8 1.8 1.9  --   PHOS 2.2* 3.0 2.8  --     CBC  Recent Labs Lab 04/01/15 0417 04/02/15 0456 04/03/15 0500  WBC 10.0 11.9* 15.3*  HGB 8.2* 8.5* 9.0*  HCT 25.0* 26.3* 26.9*  PLT 188 226 277    Coag's  Recent Labs Lab 03/27/15 2205 03/27/15 2206 03/28/15 0547 04/02/15 0730  APTT 37 36 30  --   INR 1.32 1.31 1.23 1.20    Sepsis Markers  Recent Labs Lab 03/27/15 1210 04/02/15 1100  LATICACIDVEN 3.9* 0.8  ABG  Recent Labs Lab 03/28/15 0743 03/29/15 0334 03/30/15 0324  PHART 7.325* 7.424 7.455*  PCO2ART 41.4 34.7* 31.3*  PO2ART 149.0* 161.0* 133.0*    Liver Enzymes  Recent Labs Lab 03/28/15 0547 04/01/15 0417 04/02/15 0456  AST 39 16 46*  ALT 20 13* 29  ALKPHOS 44 43 44  BILITOT 1.5* 0.8 1.0  ALBUMIN 2.9* 2.1* 2.1*    Cardiac Enzymes  Recent Labs Lab 03/27/15 1210  TROPONINI 0.11*    Glucose  Recent Labs Lab 04/02/15 0818 04/02/15 1220 04/02/15 1604 04/02/15 2351 04/03/15 0347 04/03/15 0601  GLUCAP 135* 138* 128* 190* 168* 167*    Imaging Dg Chest Port 1 View  04/02/2015  CLINICAL DATA:  Respiratory failure.  Preop EXAM: PORTABLE CHEST 1 VIEW COMPARISON:  04/01/2015 FINDINGS: Endotracheal tube tip at the clavicular heads. There are bilateral neck catheters with tips in stable position. The left neck catheter overlaps arterial and venous structures as before. An  orogastric tube reaches the stomach at least. Low volumes with perihilar density, streaky at the right base. No edema, effusion, or pneumothorax. IMPRESSION: 1. Stable positioning of tubes and lines. 2. Unchanged low volumes with perihilar atelectasis. Electronically Signed   By: Marnee Spring M.D.   On: 04/02/2015 11:02   STUDIES:  CT abd 2/17 >> Large RP hematoma with renal, perinephric hematoma Intra op TEE 2/17 >> Normal cardiac function by verbal report.   CULTURES: MRSA screen (2/18) (-) Blood culture (2/23) >> Sputum culture (2/23) >>    ANTIBIOTICS: Cefazolin 2/17 >> 2/18 Vanc 2/23 >> Zosyn 2/23 >>  SIGNIFICANT EVENTS: 2/17 - Admit for L5 laminectomy >> hemorrhagic shock from RP bleed.   LINES/TUBES: Rt IJ 2/17 >>  DISCUSSION: 45 y/o with hemorraghic shock from large RP hematoma that occurred during operation for L5 laminectomy, s/p L CIV repair and L iliac artery bypass.   ASSESSMENT / PLAN:  PULMONARY A: Acute Hypoxemic Hypercapneic Respiratory Failure 2 to Hemorrhagic Shock, Unable to Protect the Airway, Pulmonary Edema, RVD 2 to Morbid Obesity. History of asthma -- seems stable.  Possible OSA Concern for HCAP given fever (2/23). CXR is not impressive for HCAP.  P:  --cont vent support for now. On 30% Fio2. doing fair on daily PST. Doing daily wean trials to exercise pt. Pt is for another OR on 2/27.  Hopefully, we can extubate 24-48 hrs after.  --cont zosyn/vanc >> de escalate once with cultures --cont diuresis. I think she needs more lasix.   CARDIOVASCULAR A:  S/P Hemorrhagic Shock. Off pressors. Hb has been stable. Pulm edema -- better.  P:  --cont diuresis. Correct K, Mg.  -- suggest to increase lasix to q12. >> will increase lasix to q12, ok with dr. Imogene Burn  RENAL A:  Has Volume Overload P:  --cont diuresis --replace K, Mg   GASTROINTESTINAL A:  Malnutrition, likely moderate.  Constipation P:  --pt had OR on 2/23 for abd  closure but her gut was full of stool and had edema so they were not able to close.  --plan to start reglan qid via NGT. Spoke with dr. Imogene Burn -- OK with it. . Need to decompress abd. May need enema, or erythromycin, etc.  --PUD prophylaxis --cont TPN. --cut down on opiates  HEMATOLOGIC A:  S/P Hemorrhagic Shock with large RP bleed. Hb and hct have been stable. LUE DVT P:  --Hb and hct have been. No blood transfusion last 3d.  --cont heparin drip --plan for surgery on 2/27 for abd  closure. Needs to move her bowels prior to surgery.   INFECTIOUS A:  R/O HCAP. D7 on the vent R/O Abd infxn P:  --Cont vanc and zosyn (D2) >> deescalate once with cultures.  --f/u sputum and blood cultures >>  --Lactate was 0.8 on 2/23  ENDOCRINE A:  No issues P:  --keep BS < 180 mg% --on SSI  NEUROLOGIC A:  Post op pain P:  --RASS goal: -1 --comfortable on precedex drip.  --cont fentanyl drip.     FAMILY  - Updates: family updated.   - Inter-disciplinary family meet or Palliative Care meeting due by: 2/29  -- I spent 30 minutes of Critical Care time with this pt today.    Lance Sell A. Christene Slates, MD Pulmonary and Critical Care Medicine [facility] Pager: [phone] After 3 pm or if no response, call [phone]  04/03/2015, 9:57 AM

## 2015-04-03 NOTE — Progress Notes (Signed)
ANTICOAGULATION CONSULT NOTE - Follow Up Consult  Pharmacy Consult for Heparin  Indication: DVT  Allergies  Allergen Reactions  . Propoxyphene Other (See Comments)    Unspecified    Patient Measurements: Height:  (160 cm) Weight: 298 lb (135.172 kg) IBW/kg (Calculated) : 52.4 Heparin dosing wt: 85 kg  Vital Signs: Temp: 100.5 F (38.1 C) (02/24 0400) Temp Source: Oral (02/24 0400) BP: 93/52 mmHg (02/24 0400) Pulse Rate: 83 (02/24 0400)  Labs:  Recent Labs  03/31/15 0730  04/01/15 0417  04/02/15 0456 04/02/15 0730 04/02/15 2140 04/03/15 0500  HGB  --   < > 8.2*  --  8.5*  --   --  9.0*  HCT  --   --  25.0*  --  26.3*  --   --  26.9*  PLT  --   --  188  --  226  --   --  277  LABPROT  --   --   --   --   --  15.4*  --   --   INR  --   --   --   --   --  1.20  --   --   HEPARINUNFRC  --   --   --   < > 0.41  --  0.23* 0.27*  CREATININE 0.64  --  0.72  --  0.60  --   --   --   < > = values in this interval not displayed.  Estimated Creatinine Clearance: 119.9 mL/min (by C-G formula based on Cr of 0.6).  Assessment: 45 y.o female with hemorraghic shock from large RP hematoma that occurred during elective operation. Found to have a DVT. Continuing on heparin per pharmacy. Ok to bolus per Dr. Imogene Burn.  Heparin level remains subtherapeutic (0.27) on 2100 units/hr. Hgb low but stable. No issues with line or bleeding reported per RN.  Goal of Therapy:  Heparin level 0.3-0.7 units/ml Monitor platelets by anticoagulation protocol: Yes   Plan:  Increase heparin to 2300 units/hr F/u 6 hr heparin level  Christoper Fabian, PharmD, BCPS Clinical pharmacist, pager [phone] 04/03/2015 5:44 AM

## 2015-04-03 NOTE — Progress Notes (Signed)
PARENTERAL NUTRITION CONSULT NOTE - FOLLOW UP  Pharmacy Consult:  TPN Indication:  Open abdomen  Allergies  Allergen Reactions  . Propoxyphene Other (See Comments)    Unspecified    Patient Measurements: Height:  (160 cm) Weight: 298 lb (135.172 kg) IBW/kg (Calculated) : 52.4  BMI = 59.7  Vital Signs: Temp: 99.6 F (37.6 C) (02/24 0750) Temp Source: Oral (02/24 0750) BP: 112/70 mmHg (02/24 0700) Pulse Rate: 94 (02/24 0700) Intake/Output from previous day: 02/23 0701 - 02/24 0700 In: 4664.5 [I.V.:1613.5; NG/GT:90; IV Piggyback:950; TPN:2011] Out: 4260 [Urine:3335; Emesis/NG output:275; Drains:650]  Labs:  Recent Labs  04/01/15 0417 04/02/15 0456 04/02/15 0730 04/03/15 0500  WBC 10.0 11.9*  --  15.3*  HGB 8.2* 8.5*  --  9.0*  HCT 25.0* 26.3*  --  26.9*  PLT 188 226  --  277  INR  --   --  1.20  --      Recent Labs  04/01/15 0417  04/02/15 0455 04/02/15 0456  04/02/15 1800 04/02/15 2355 04/03/15 0500  NA 141  --   --  138  --   --   --  134*  K 3.3*  < >  --  3.6  < > 4.0 3.4* 4.0  CL 106  --   --  104  --   --   --  105  CO2 23  --   --  24  --   --   --  24  GLUCOSE 129*  --   --  156*  --   --   --  185*  BUN 6  --   --  14  --   --   --  17  CREATININE 0.72  --   --  0.60  --   --   --  0.78  CALCIUM 8.3*  --   --  8.5*  --   --   --  8.0*  MG 1.8  --   --  1.9  --   --   --   --   PHOS 3.0  --   --  2.8  --   --   --   --   PROT 5.2*  --   --  5.6*  --   --   --   --   ALBUMIN 2.1*  --   --  2.1*  --   --   --   --   AST 16  --   --  46*  --   --   --   --   ALT 13*  --   --  29  --   --   --   --   ALKPHOS 43  --   --  44  --   --   --   --   BILITOT 0.8  --   --  1.0  --   --   --   --   PREALBUMIN 8.4*  --   --   --   --   --   --   --   TRIG  --   --  72  --   --   --   --   --   < > = values in this interval not displayed. Estimated Creatinine Clearance: 119.9 mL/min (by C-G formula based on Cr of 0.78).    Recent Labs   04/02/15 2351 04/03/15 0347 04/03/15 0601  GLUCAP 190* 168*  167*     Insulin Requirements in the past 24 hours:  6 units SSI  Assessment: 44 YOF admitted on 03/27/15 with back and leg pain and underwent L5 laminectomy with facetectomy later that day.  Intraoperatively patient went into shock and CT revealed large left RP hematoma.  Patient returned to the OR for ex-lap with repair of left iliac vein, ligation of distal left CIA, left CIA-CFA bypass, and placement of abdominal VAC.  Pharmacy consulted to manage TPN for nutritional support.  GI: hx GERD / obesity.  Baseline prealbumin low at 8.4.  Frequent OR visits for abd washout (2/19, 2/21) - too edematous for abd closure on 2/23, return to OR Mon 2/27.  NG/emesis decreasing, now , drain O/P 650 ml.  PPI IV, PO docusate (not given) Endo: no hx DM - hypoglycemic prior to TPN initiation, CBGs mildly elevated to ~ 190s Lytes: lytes WNL, except Na 134 Renal: SCr stable, CrCL > 100 ml/min - UOP 0.9 ml/kg/hr, D5NS at 10 ml/hr Pulm: intubated, FiO2 30% Cards: angina - VSS - Lasix IV (dose reduced to daily), net + 400 mL AC: Heparin for new left femoral DVT (dx 03/29/15) - hgb improved to 8.5, plts WNL Hepatobil:LFTs WNL, tbili 1.  TG 157 > 123 > 72 Neuro: Propofol >> Precedex, Fentanyl gtt - GCS 13, CPOT 0 ID: tmax 100.8, WBC 15.3 - not on abx (now with pre-op abx)  Best Practices: heparin gtt, MC TPN Access: right IJ placed 03/27/15 TPN start date: 2/21 >>  Current Nutrition:  TPN  Nutritional Goals:  1450-1890 kCal, 130-140 grams of protein per day  Plan:  - Continue Clinimix E 5/15 to 110 ml/hr (goal rate 110 ml/hr).  Clinimix providing 1874 kCal and 132gm protein per day and meeting 100% of needs. - Hold lipid emulsion in critically ill patients for the first 7 days of TPN per SCCM/ASPEN guidelines (D#4/7)  - Daily multivitamin and trace elements - Change to moderate SSI Q6H - Consider cyclic TPN on Mon after abdomen  closure?  Isaac Bliss, PharmD, BCPS, [facility] Clinical Pharmacist Pager [phone] 04/03/2015 8:23 AM

## 2015-04-03 NOTE — Progress Notes (Signed)
No issues overnight.   EXAM:  BP 102/59 mmHg  Pulse 82  Temp(Src) 99.8 F (37.7 C) (Oral)  Resp 20  Ht  (1.6 m)  Wt 135.172 kg (298 lb)  BMI 52.80 kg/m2  SpO2 100%  LMP 03/27/2015  On sedation, Eyes open spontaneously FC briskly BLE, symmetric Abd vac in place  IMPRESSION:  45 y.o. female s/p repair of CIV and CIA -> CFA bypass. Mult attempts to close abd.  PLAN: - Cont current mgmt per vasc surg - Attempt abd closure again on Mon - Placement of lumbar hardware deferred until she's stable, abd closed, and can tol being prone.

## 2015-04-03 NOTE — Progress Notes (Signed)
ANTICOAGULATION CONSULT NOTE - Follow Up Consult  Pharmacy Consult for Heparin  Indication: DVT  Allergies  Allergen Reactions  . Propoxyphene Other (See Comments)    Unspecified    Patient Measurements: Height:  (160 cm) Weight: 298 lb (135.172 kg) IBW/kg (Calculated) : 52.4 Heparin dosing wt: 84.5kg  Vital Signs: Temp: 99.8 F (37.7 C) (02/24 1257) Temp Source: Oral (02/24 1257) BP: 102/59 mmHg (02/24 1000) Pulse Rate: 82 (02/24 1100)  Labs:  Recent Labs  04/01/15 0417  04/02/15 0456 04/02/15 0730 04/02/15 2140 04/03/15 0500 04/03/15 1203  HGB 8.2*  --  8.5*  --   --  9.0*  --   HCT 25.0*  --  26.3*  --   --  26.9*  --   PLT 188  --  226  --   --  277  --   LABPROT  --   --   --  15.4*  --   --   --   INR  --   --   --  1.20  --   --   --   HEPARINUNFRC  --   < > 0.41  --  0.23* 0.27* 0.40  CREATININE 0.72  --  0.60  --   --  0.78  --   < > = values in this interval not displayed.  Estimated Creatinine Clearance: 119.9 mL/min (by C-G formula based on Cr of 0.78).  Assessment: 45 y.o female with hemorraghic shock from large RP hematoma that occurred during elective operation. Found to have a DVT. Continuing on heparin per pharmacy. Ok to bolus per Dr. Imogene Burn.  Heparin level now therapeutic 0.4 on 2300 units/hr  Goal of Therapy:  Heparin level 0.3-0.7 units/ml Monitor platelets by anticoagulation protocol: Yes   Plan:  Continue heparin at 2300 units/hr  daily HL/CBC  Monitor for bleeding  Switch to lovenox when abdomen closed  F/u long-term [facility] plans after all OR trips completed, hopeful to close 2/27  Isaac Bliss, PharmD, BCPS, [facility] Clinical Pharmacist Pager [phone] 04/03/2015 1:11 PM

## 2015-04-03 NOTE — Plan of Care (Signed)
Problem: Activity: Goal: Ability to tolerate increased activity will improve Outcome: Not Progressing Pt remains intubated until the abdomen can be closed. Goal: Mobility will improve Outcome: Not Progressing See previous  Problem: Bowel/Gastric: Goal: Gastrointestinal status for postoperative course will improve Outcome: Not Progressing Pt NPO with little to no bowel sounds. TPN started.  Problem: Education: Goal: Will regain or maintain usual level of consciousness Outcome: Progressing Pt sedated on vent but is following commands  Problem: Nutrition: Goal: Ability to attain and maintain optimal nutritional status will improve Outcome: Progressing TPN

## 2015-04-04 ENCOUNTER — Inpatient Hospital Stay (HOSPITAL_COMMUNITY): Payer: PRIVATE HEALTH INSURANCE

## 2015-04-04 LAB — GLUCOSE, CAPILLARY
Glucose-Capillary: 114 mg/dL — ABNORMAL HIGH (ref 65–99)
Glucose-Capillary: 123 mg/dL — ABNORMAL HIGH (ref 65–99)
Glucose-Capillary: 164 mg/dL — ABNORMAL HIGH (ref 65–99)
Glucose-Capillary: 169 mg/dL — ABNORMAL HIGH (ref 65–99)

## 2015-04-04 LAB — HEPARIN LEVEL (UNFRACTIONATED): Heparin Unfractionated: 0.38 [IU]/mL (ref 0.30–0.70)

## 2015-04-04 LAB — CBC
HEMATOCRIT: 26.8 % — AB (ref 36.0–46.0)
Hemoglobin: 8.4 g/dL — ABNORMAL LOW (ref 12.0–15.0)
MCH: 28.3 pg (ref 26.0–34.0)
MCHC: 31.3 g/dL (ref 30.0–36.0)
MCV: 90.2 fL (ref 78.0–100.0)
Platelets: 313 10*3/uL (ref 150–400)
RBC: 2.97 MIL/uL — ABNORMAL LOW (ref 3.87–5.11)
RDW: 14.5 % (ref 11.5–15.5)
WBC: 16.3 10*3/uL — ABNORMAL HIGH (ref 4.0–10.5)

## 2015-04-04 LAB — POTASSIUM
POTASSIUM: 4.2 mmol/L (ref 3.5–5.1)
Potassium: 3.6 mmol/L (ref 3.5–5.1)
Potassium: 3.7 mmol/L (ref 3.5–5.1)
Potassium: 3.9 mmol/L (ref 3.5–5.1)

## 2015-04-04 MED ORDER — TRACE MINERALS CR-CU-MN-SE-ZN 10-1000-500-60 MCG/ML IV SOLN
INTRAVENOUS | Status: AC
  Administered 2015-04-04: 18:00:00 via INTRAVENOUS
  Filled 2015-04-04: qty 2640

## 2015-04-04 MED ORDER — SODIUM CHLORIDE 0.9% FLUSH
10.0000 mL | Freq: Two times a day (BID) | INTRAVENOUS | Status: DC
  Administered 2015-04-04 – 2015-04-14 (×15): 10 mL
  Administered 2015-04-15: 30 mL
  Administered 2015-04-15 – 2015-04-18 (×5): 10 mL
  Administered 2015-04-19: 40 mL
  Administered 2015-04-19 – 2015-04-20 (×2): 20 mL
  Administered 2015-04-20 – 2015-04-22 (×5): 10 mL
  Administered 2015-04-22: 20 mL
  Administered 2015-04-23: 10 mL
  Administered 2015-04-24: 15 mL
  Administered 2015-04-25 – 2015-04-27 (×4): 10 mL

## 2015-04-04 MED ORDER — POTASSIUM CHLORIDE 10 MEQ/50ML IV SOLN
10.0000 meq | INTRAVENOUS | Status: AC
  Administered 2015-04-04 (×2): 10 meq via INTRAVENOUS
  Filled 2015-04-04 (×2): qty 50

## 2015-04-04 MED ORDER — SODIUM CHLORIDE 0.9% FLUSH
10.0000 mL | INTRAVENOUS | Status: DC | PRN
  Administered 2015-04-14: 10 mL
  Administered 2015-04-16: 20 mL
  Filled 2015-04-04 (×2): qty 40

## 2015-04-04 NOTE — Progress Notes (Signed)
Peripherally Inserted Central Catheter/Midline Placement  The IV Nurse has discussed with the patient and/or persons authorized to consent for the patient, the purpose of this procedure and the potential benefits and risks involved with this procedure.  The benefits include less needle sticks, lab draws from the catheter and patient may be discharged home with the catheter.  Risks include, but not limited to, infection, bleeding, blood clot (thrombus formation), and puncture of an artery; nerve damage and irregular heat beat.  Alternatives to this procedure were also discussed.  Consent obtained from husband via telephone.  PICC/Midline Placement Documentation  PICC Triple Lumen 04/04/15 PICC Right Brachial 42 cm 0 cm (Active)  Indication for Insertion or Continuance of Line Vasoactive infusions;Prolonged intravenous therapies;Administration of hyperosmolar/irritating solutions (i.e. TPN, Vancomycin, etc.) 04/04/2015  2:06 PM  Exposed Catheter (cm) 0 cm 04/04/2015  2:06 PM  Site Assessment Clean;Dry;Intact 04/04/2015  2:06 PM  Lumen #1 Status Flushed;Saline locked;Blood return noted 04/04/2015  2:06 PM  Lumen #2 Status Flushed;Saline locked;Blood return noted 04/04/2015  2:06 PM  Lumen #3 Status Flushed;Saline locked;Blood return noted 04/04/2015  2:06 PM  Dressing Type Transparent 04/04/2015  2:06 PM  Dressing Status Clean;Dry;Intact;Antimicrobial disc in place 04/04/2015  2:06 PM  Line Care Connections checked and tightened 04/04/2015  2:06 PM  Line Adjustment (NICU/IV Team Only) No 04/04/2015  2:06 PM  Dressing Intervention New dressing 04/04/2015  2:06 PM  Dressing Change Due 04/11/15 04/04/2015  2:06 PM       Kristina Estes 04/04/2015, 2:08 PM

## 2015-04-04 NOTE — Progress Notes (Signed)
PULMONARY / CRITICAL CARE MEDICINE   Name: Kristina Estes MRN: [id number] DOB: 1970/11/14    ADMISSION DATE:  03/27/2015  CONSULTATION DATE: 03/27/15  REFERRING MD: Conchita Paris  CHIEF COMPLAINT: Hemorrhagic shock  HISTORY OF PRESENT ILLNESS:  Kristina Estes is a 45 year old with history of asthma, arthritis, obesity. She had an elective admission today for L5 laminectomy. Intra-Op she went into shock, hemodynamic instability. Subsequent CT showed large left retroperitoneal hematoma. She was put on 2 pressors (norepi, epinephrine) and transferred to ICU. PCCM consulted for help with management. Vascular surgery was also consulted, underwent repair of L CIV with bypass to CFA. Intra OP TEE showed normal cardiac function.  SUBJECTIVE:  Awake, c/o expected abd pain. No evidence of distress.  Right CVL has come out & needs to be removed.   VITAL SIGNS: BP 100/56 mmHg  Pulse 88  Temp(Src) 99.2 F (37.3 C) (Axillary)  Resp 21  Ht  (1.6 m)  Wt 288 lb (130.636 kg)  BMI 51.03 kg/m2  SpO2 100%  LMP 03/27/2015  HEMODYNAMICS:    VENTILATOR SETTINGS: Vent Mode:  [-] PSV FiO2 (%):  [30 %] 30 % Set Rate:  [20 bmp] 20 bmp Vt Set:  [450 mL] 450 mL PEEP:  [5 cmH20] 5 cmH20 Pressure Support:  [16 cmH20] 16 cmH20 Plateau Pressure:  [26 cmH20-28 cmH20] 28 cmH20  INTAKE / OUTPUT: I/O last 3 completed shifts: In: 7418 [I.V.:2328; NG/GT:240; IV Piggyback:1000] Out: 7265 [Urine:5090; Emesis/NG output:925; Drains:1250]   PHYSICAL EXAMINATION: General: awake, c/o abd pain, no other clear distress Neuro: CN grossly intact. Pt is sedated so limited exam.  HEENT: PERLA, (+) corneals; orally intubated. Right IJ leaking IVFs, left Introducer unremarkable Cardiovascular: Good si/s2; (-) s3, m,r,g Lungs: Dec BS BLS. No accessory muscle use Abdomen: Dec BS. (+) open abd. (+) surgical dressing. Very diminished BS.  Musculoskeletal: Gr 2 edema. less edema this am.  Skin: Skin was  warm and dry  LABS:  BMET  Recent Labs Lab 04/01/15 0417  04/02/15 0456  04/03/15 0500  04/03/15 1849 04/03/15 2340 04/04/15 0410  NA 141  --  138  --  134*  --   --   --   --   K 3.3*  < > 3.6  < > 4.0  < > 4.1 3.6 4.2  CL 106  --  104  --  105  --   --   --   --   CO2 23  --  24  --  24  --   --   --   --   BUN 6  --  14  --  17  --   --   --   --   CREATININE 0.72  --  0.60  --  0.78  --   --   --   --   GLUCOSE 129*  --  156*  --  185*  --   --   --   --   < > = values in this interval not displayed.  Electrolytes  Recent Labs Lab 03/31/15 0730 04/01/15 0417 04/02/15 0456 04/03/15 0500  CALCIUM 8.0* 8.3* 8.5* 8.0*  MG 1.8 1.8 1.9  --   PHOS 2.2* 3.0 2.8  --     CBC  Recent Labs Lab 04/02/15 0456 04/03/15 0500 04/04/15 0410  WBC 11.9* 15.3* 16.3*  HGB 8.5* 9.0* 8.4*  HCT 26.3* 26.9* 26.8*  PLT 226 277 313    Coag's  Recent Labs Lab 04/02/15  0730  INR 1.20    Sepsis Markers  Recent Labs Lab 04/02/15 1100  LATICACIDVEN 0.8    ABG  Recent Labs Lab 03/29/15 0334 03/30/15 0324  PHART 7.424 7.455*  PCO2ART 34.7* 31.3*  PO2ART 161.0* 133.0*    Liver Enzymes  Recent Labs Lab 04/01/15 0417 04/02/15 0456  AST 16 46*  ALT 13* 29  ALKPHOS 43 44  BILITOT 0.8 1.0  ALBUMIN 2.1* 2.1*    Cardiac Enzymes No results for input(s): TROPONINI, PROBNP in the last 168 hours.  Glucose  Recent Labs Lab 04/03/15 0347 04/03/15 0601 04/03/15 1256 04/03/15 1738 04/03/15 2330 04/04/15 0540  GLUCAP 168* 167* 199* 144* 169* 164*    Imaging No results found. STUDIES:  CT abd 2/17 >> Large RP hematoma with renal, perinephric hematoma Intra op TEE 2/17 >> Normal cardiac function by verbal report.   CULTURES: MRSA screen (2/18) (-) Blood culture (2/23) >> Sputum culture (2/23) >>    ANTIBIOTICS: Cefazolin 2/17 >> 2/18 Vanc 2/23 >> Zosyn 2/23 >>  SIGNIFICANT EVENTS: 2/17 - Admit for L5 laminectomy >> hemorrhagic shock from RP  bleed.  2/25: CVL out.  LINES/TUBES: Rt IJ 2/17 >> 2/25  DISCUSSION: 45 y/o with hemorraghic shock from large RP hematoma that occurred during operation for L5 laminectomy, s/p L CIV repair and L iliac artery bypass. Will cont supportive care. Plan for back to OR Monday. Will keep comfortable on vent for now. IV access is the issue for today. Line got inadvertently withdrawn. Ideally needs PICC, if this can be done today will go with this and remove the cortis as well. If can't will place CVL  ASSESSMENT / PLAN:  PULMONARY A: Acute Hypoxemic Hypercapneic Respiratory Failure 2 to Hemorrhagic Shock, Unable to Protect the Airway, Pulmonary Edema, RVD 2 to Morbid Obesity. History of asthma -- seems stable.  Possible OSA Concern for HCAP given fever (2/23). CXR is not impressive for HCAP.  P:  --cont vent support for now. On 30% Fio2. doing fair on daily PST. Doing daily wean trials to exercise pt. Pt is for another OR on 2/27.  Hopefully, we can extubate 24-48 hrs after.  --f/u cxr in am  --see ID section --cont diuresis as BP/BUN/cr allow  CARDIOVASCULAR A:  S/P Hemorrhagic Shock. Off pressors. Hb has been stable. Pulm edema -- better.  S/p Iliac artery repair P:  --cont diuresis. Correct K, Mg.  --cont tele   RENAL A:  Volume overload  P:  --cont diuresis --strict I&O --replace K, Mg   GASTROINTESTINAL A:  Malnutrition, likely moderate.  Constipation --pt had OR on 2/23 for abd closure but her gut was full of stool and had edema so they were not able to close.  Plan --plan to start reglan qid via NGT. Spoke with dr. Imogene Burn -- OK with it. . Need to decompress abd. May need enema, or erythromycin, etc.  --PUD prophylaxis --cont TPN. --cut down on opiates  HEMATOLOGIC A:  S/P Hemorrhagic Shock with large RP bleed. Hb and hct have been stable. LUE DVT P:  --Hb and hct have been. No blood transfusion last 4d.  --cont heparin drip --plan for surgery on  2/27 for abd closure. Needs to move her bowels prior to surgery.   INFECTIOUS A:  R/O HCAP. D7 on the vent R/O Abd infxn P:  --Cont vanc and zosyn (D3) >> deescalate once with cultures.  --f/u sputum and blood cultures >>   ENDOCRINE A:  No issues P:  --keep  BS < 180 mg% --on SSI  NEUROLOGIC A:  Post op pain P:  --RASS goal: -1 --comfortable on precedex drip.  --cont fentanyl drip.     FAMILY  - Updates: family updated.   - Inter-disciplinary family meet or Palliative Care meeting due by: 2/29  Simonne Martinet ACNP-BC [facility] Pulmonary/Critical Care Pager # [phone] OR # [phone] if no answer      04/04/2015, 10:10 AM

## 2015-04-04 NOTE — Progress Notes (Signed)
PARENTERAL NUTRITION CONSULT NOTE - FOLLOW UP  Pharmacy Consult:  TPN Indication:  Open abdomen  Allergies  Allergen Reactions  . Propoxyphene Other (See Comments)    Unspecified    Patient Measurements: Height:  (160 cm) Weight: 288 lb (130.636 kg) IBW/kg (Calculated) : 52.4  BMI = 59.7  Vital Signs: Temp: 99.7 F (37.6 C) (02/25 0348) Temp Source: Axillary (02/25 0348) BP: 100/56 mmHg (02/25 0700) Pulse Rate: 88 (02/25 0700) Intake/Output from previous day: 02/24 0701 - 02/25 0700 In: 4872.2 [I.V.:1542.2; NG/GT:150; IV Piggyback:650; TPN:2530] Out: 4580 [Urine:3180; Emesis/NG output:650; Drains:750]  Labs:  Recent Labs  04/02/15 0456 04/02/15 0730 04/03/15 0500 04/04/15 0410  WBC 11.9*  --  15.3* 16.3*  HGB 8.5*  --  9.0* 8.4*  HCT 26.3*  --  26.9* 26.8*  PLT 226  --  277 313  INR  --  1.20  --   --      Recent Labs  04/02/15 0455 04/02/15 0456  04/03/15 0500  04/03/15 1849 04/03/15 2340 04/04/15 0410  NA  --  138  --  134*  --   --   --   --   K  --  3.6  < > 4.0  < > 4.1 3.6 4.2  CL  --  104  --  105  --   --   --   --   CO2  --  24  --  24  --   --   --   --   GLUCOSE  --  156*  --  185*  --   --   --   --   BUN  --  14  --  17  --   --   --   --   CREATININE  --  0.60  --  0.78  --   --   --   --   CALCIUM  --  8.5*  --  8.0*  --   --   --   --   MG  --  1.9  --   --   --   --   --   --   PHOS  --  2.8  --   --   --   --   --   --   PROT  --  5.6*  --   --   --   --   --   --   ALBUMIN  --  2.1*  --   --   --   --   --   --   AST  --  46*  --   --   --   --   --   --   ALT  --  29  --   --   --   --   --   --   ALKPHOS  --  44  --   --   --   --   --   --   BILITOT  --  1.0  --   --   --   --   --   --   TRIG 72  --   --   --   --   --   --   --   < > = values in this interval not displayed. Estimated Creatinine Clearance: 117.3 mL/min (by C-G formula based on Cr of 0.78).    Recent Labs  04/03/15 1738 04/03/15 2330 04/04/15  0540   GLUCAP 144* 169* 164*     Insulin Requirements in the past 24 hours:  11 units SSI  Assessment: 44 YOF admitted on 03/27/15 with back and leg pain and underwent L5 laminectomy with facetectomy later that day.  Intraoperatively patient went into shock and CT revealed large left RP hematoma.  Patient returned to the OR for ex-lap with repair of left iliac vein, ligation of distal left CIA, left CIA-CFA bypass, and placement of abdominal VAC.  Pharmacy consulted to manage TPN for nutritional support.  GI: hx GERD / obesity.  Baseline prealbumin low at 8.4.  Frequent OR visits for abd washout (2/19, 2/21) - too edematous for abd closure on 2/23, return to OR Mon 2/27.  NG/emesis up to 475 , drain O/P 750 ml.  PPI IV, PO docusate (not given). On scheduled metoclopramide   Endo: no hx DM - hypoglycemic prior to TPN initiation, CBGs now controlled at 169-199 Lytes: lytes WNL, except Na 134 Renal: SCr stable, CrCL > 100 ml/min - UOP 1.1 ml/kg/hr, D5NS at 10 ml/hr Pulm: intubated, FiO2 30% Cards: angina - VSS - Lasix IV (dose reduced to daily), net + 173 mL AC: Heparin for new left femoral DVT (dx 03/29/15) - hgb stable at 8.4, plts WNL Hepatobil:LFTs WNL, tbili 1.  TG 157 > 123 > 72 Neuro: Propofol >> Precedex, Fentanyl gtt - GCS 13, CPOT 0 ID: tmax 99.7, WBC 16.3 - not on abx (now with pre-op abx)  Best Practices: heparin gtt, MC TPN Access: right IJ placed 03/27/15 TPN start date: 2/21 >>  Current Nutrition:  TPN  Nutritional Goals:  1450-1890 kCal, 130-140 grams of protein per day  Plan:  - Continue Clinimix E 5/15 to 110 ml/hr (goal rate 110 ml/hr).  Clinimix providing 1874 kCal and 132gm protein per day and meeting 100% of needs. - Hold lipid emulsion in critically ill patients for the first 7 days of TPN per SCCM/ASPEN guidelines (D#5/7)  - Daily multivitamin and trace elements - F/u TPN labs tomorrow  - Change to moderate SSI Q6H - Consider cyclic TPN on Mon after abdomen  closure?  Vinnie Level, PharmD., BCPS Clinical Pharmacist Pager [phone]

## 2015-04-04 NOTE — Progress Notes (Signed)
Subjective: Interval History: none..Still on vent.   Objective: Vital signs in last 24 hours: Temp:  [99.6 F (37.6 C)-100.1 F (37.8 C)] 99.7 F (37.6 C) (02/25 0348) Pulse Rate:  [79-94] 88 (02/25 0700) Resp:  [20-27] 21 (02/25 0700) BP: (89-120)/(45-70) 100/56 mmHg (02/25 0700) SpO2:  [98 %-100 %] 100 % (02/25 0700) Arterial Line BP: (74-108)/(47-81) 99/71 mmHg (02/25 0700) FiO2 (%):  [30 %] 30 % (02/25 0307) Weight:  [288 lb (130.636 kg)] 288 lb (130.636 kg) (02/25 0200)  Intake/Output from previous day: 02/24 0701 - 02/25 0700 In: 4872.2 [I.V.:1542.2; NG/GT:150; IV Piggyback:650; TPN:2530] Out: 4580 [Urine:3180; Emesis/NG output:650; Drains:750] Intake/Output this shift:   Abdomen soft. VAC dressing intact.  Lab Results:  Recent Labs  04/03/15 0500 04/04/15 0410  WBC 15.3* 16.3*  HGB 9.0* 8.4*  HCT 26.9* 26.8*  PLT 277 313   BMET  Recent Labs  04/02/15 0456  04/03/15 0500  04/03/15 2340 04/04/15 0410  NA 138  --  134*  --   --   --   K 3.6  < > 4.0  < > 3.6 4.2  CL 104  --  105  --   --   --   CO2 24  --  24  --   --   --   GLUCOSE 156*  --  185*  --   --   --   BUN 14  --  17  --   --   --   CREATININE 0.60  --  0.78  --   --   --   CALCIUM 8.5*  --  8.0*  --   --   --   < > = values in this interval not displayed.  Studies/Results: Ct Abdomen Pelvis Wo Contrast  03/27/2015  CLINICAL DATA:  45 year old who became acutely hypotensive intraoperatively during L5-S1 posterior decompression and discectomy earlier today. EXAM: CT ABDOMEN AND PELVIS WITHOUT CONTRAST TECHNIQUE: Multidetector CT imaging of the abdomen and pelvis was performed following the standard protocol without IV contrast. COMPARISON:  None. FINDINGS: Lower chest: Blurred by respiratory motion. Mild dependent atelectasis posteriorly in the lower lobes. Visualized lung bases otherwise clear. Normal heart size. Hepatobiliary: Beam hardening streak artifact as the patient was unable to raise the  arms. Allowing for this, normal unenhanced appearance of the liver. Gallbladder surgically absent. No unexpected biliary ductal dilation. Pancreas: Normal unenhanced appearance. Spleen: Normal unenhanced appearance. Adrenals/Urinary Tract: Normal appearing adrenal glands. Unenhanced appearance of the right kidney. Acute perinephric and subcapsular hematoma involving the left kidney. No visible focal parenchymal abnormality involving the left kidney, allowing for the unenhanced technique. Urinary bladder decompressed by Foley catheter. Stomach/Bowel: Stomach normal in appearance for the degree of distention. Normal-appearing small bowel. Normal-appearing colon with expected stool burden. Normal appendix in the right upper pelvis. Vascular/Lymphatic: No visible aortoiliofemoral atherosclerosis. No pathologic lymphadenopathy. Reproductive: Normal-appearing uterus and ovaries without evidence of adnexal mass. Other: Large retroperitoneal hematoma involving predominantly the left side of the abdomen and pelvis, extending from the level of the upper pole of the left kidney inferiorly to the low pelvis, maximum measurements approximating 7.4 x 9.9 x 17.0 cm. The largest portion of the hematoma is anterior to the left psoas muscle. There is a smaller component of the retroperitoneal hematoma in the right side of the abdomen. The retroperitoneal hematoma displaces the left kidney anteriorly. A small amount of intraperitoneal hemorrhage is present in the paracolic gutters, left greater than right. Musculoskeletal: Postsurgical changes related to L5-S1  discectomy and posterior decompression at L5. Gas bubbles in the surgical tract, the epidural space and the L5-S1 disc. No abnormal fluid collection to suggest abscess or leak. IMPRESSION: 1. Large retroperitoneal hematoma involving predominantly the left side of the abdomen and pelvis, maximum measurements given above. 2. Subcapsular left renal hematoma and left perinephric  hematoma, contiguous with the retroperitoneal hematoma. 3. Small amount of intraperitoneal hemorrhage in the paracolic gutters, left greater than right. 4. Expected postsurgical changes at L5-S1. Given the fact that the patient has a subcapsular left renal hematoma and a left perinephric hematoma, the possible etiology of the retroperitoneal hematoma may be due to a lesion in the left kidney. Repeat CT of the abdomen and pelvis with contrast may be helpful once the patient has recovered from surgery to exclude that possibility. These results were discussed directly with Dr. Conchita Paris at the time of interpretation 03/27/2015 at 1 o'clock p.m. Electronically Signed   By: Hulan Saas M.D.   On: 03/27/2015 13:27   US Abdomen Limited  03/27/2015  CLINICAL DATA:  Intraoperative exam to assess for abdominal/pelvic fluid/ hematoma. CT scan after this ultrasound reveals large retroperitoneal hematoma left-greater-than-right as well as left perinephric and subscapular hematoma of which Dr.Nundkumar is aware. EXAM: LIMITED ABDOMINAL ULTRASOUND COMPARISON:  CT 03/27/2015 FINDINGS: Limited all intraoperative ultrasound evaluation of the bilateral lower quadrants demonstrates no definite focal free fluid collection. IMPRESSION: No definite focal fluid collection within the lower quadrants. Electronically Signed   By: Elberta Fortis M.D.   On: 03/27/2015 14:13   Dg Chest Port 1 View  04/02/2015  CLINICAL DATA:  Respiratory failure.  Preop EXAM: PORTABLE CHEST 1 VIEW COMPARISON:  04/01/2015 FINDINGS: Endotracheal tube tip at the clavicular heads. There are bilateral neck catheters with tips in stable position. The left neck catheter overlaps arterial and venous structures as before. An orogastric tube reaches the stomach at least. Low volumes with perihilar density, streaky at the right base. No edema, effusion, or pneumothorax. IMPRESSION: 1. Stable positioning of tubes and lines. 2. Unchanged low volumes with perihilar  atelectasis. Electronically Signed   By: Marnee Spring M.D.   On: 04/02/2015 11:02   Dg Chest Port 1 View  04/01/2015  CLINICAL DATA:  45 year old female with ventilator dependent respiratory failure. Hemorrhagic shock. Initial encounter. EXAM: PORTABLE CHEST 1 VIEW COMPARISON:  03/28/2015 and earlier FINDINGS: Portable AP semi upright view at 0616 hours. Stable endotracheal tube tip just below the clavicles. Stable right IJ central line. Enteric tube courses to the abdomen, tip not included. Stable left IJ central line. Mildly improved lung volumes. Mediastinal contours remain normal. No pneumothorax or pulmonary edema. No pleural effusion identified. Left greater than right lung base atelectasis or consolidation has mildly regressed since 03/28/2015. No areas of worsening ventilation. IMPRESSION: 1.  Stable lines and tubes. 2. Mildly improved ventilation since 03/28/2015 with regressed left greater than right bibasilar atelectasis/consolidation. Electronically Signed   By: Odessa Fleming M.D.   On: 04/01/2015 07:40   Portable Chest Xray  03/28/2015  CLINICAL DATA:  Intubation EXAM: PORTABLE CHEST 1 VIEW COMPARISON:  Yesterday FINDINGS: Tubular devices are stable. Cardiomegaly is stable. Hazy opacity throughout the left hemi thorax has increased which may represent hazy airspace disease or layering pleural fluid. Vascularity is unremarkable. IMPRESSION: Increasing AC opacity throughout the left hemi thorax as described representing either hazy airspace disease or layering pleural fluid. Electronically Signed   By: Jolaine Click M.D.   On: 03/28/2015 08:56   Dg Chest [facility]  1 View  03/27/2015  CLINICAL DATA:  Open wound in the anterior abdomen.  Intubated. EXAM: PORTABLE CHEST 1 VIEW COMPARISON:  None. FINDINGS: Endotracheal tube tip is 2.3 cm above the carina. Enteric tube enters the stomach, with the tip not seen on this image. Right internal jugular central venous catheter terminates in the middle third of the  superior vena cava. Left internal jugular central venous sheath is noted with the tip overlying the left superior mediastinum, probably within the lower left internal jugular vein or left brachiocephalic vein. Top-normal heart size and normal mediastinal contour accounting for low lung volumes. No pneumothorax. No pleural effusion. Prominence of the interstitial markings probably represents vascular crowding due to low lung volumes. There is left lower lobe opacity. IMPRESSION: 1. Well-positioned support hardware as described. 2. Low lung volumes. Left lower lobe opacity likely represents atelectasis. Electronically Signed   By: Delbert Phenix M.D.   On: 03/27/2015 22:20   Dg Abd Portable 1v  03/27/2015  CLINICAL DATA:  45 year old female status post -abdominal surgery. Incorrect surgical device count EXAM: PORTABLE ABDOMEN - 1 VIEW COMPARISON:  CT dated 03/27/2015 FINDINGS: Evaluation is limited due to soft tissue attenuation by body habitus as well as portable technique. Support lines noted overlying the left upper abdomen. Right upper quadrant cholecystectomy clips seen. Multiple cutaneous surgical clips noted in the lower abdomen to the right of the spine. No other radiopaque foreign object identified. There is no evidence of bowel obstruction. No radiopaque calculi. The osseous structures are grossly unremarkable. IMPRESSION: No definite evidence of radiopaque surgical foreign object These results were called by telephone at the time of interpretation on 03/27/2015 at 8:08 pm to Nurse Julien Girt, who verbally acknowledged these results. Electronically Signed   By: Elgie Collard M.D.   On: 03/27/2015 20:11   Dg C-arm 1-60 Min-no Report  03/27/2015  CLINICAL DATA: plif 5-s1 C-ARM 1-60 MINUTES Fluoroscopy was utilized by the requesting physician.  No radiographic interpretation.   Anti-infectives: Anti-infectives    Start     Dose/Rate Route Frequency Ordered Stop   04/02/15 2330  vancomycin (VANCOCIN)  IVPB 1000 mg/200 mL premix     1,000 mg 200 mL/hr over 60 Minutes Intravenous Every 12 hours 04/02/15 1015     04/02/15 1830  piperacillin-tazobactam (ZOSYN) IVPB 3.375 g     3.375 g 12.5 mL/hr over 240 Minutes Intravenous 3 times per day 04/02/15 1015     04/02/15 1100  cefUROXime (ZINACEF) 1.5 g in dextrose 5 % 50 mL IVPB  Status:  Discontinued     1.5 g 100 mL/hr over 30 Minutes Intravenous To ShortStay Surgical 04/01/15 1106 04/02/15 1448   04/02/15 1100  vancomycin (VANCOCIN) 2,500 mg in sodium chloride 0.9 % 500 mL IVPB     2,500 mg 250 mL/hr over 120 Minutes Intravenous  Once 04/02/15 1029 04/02/15 1342   04/02/15 1030  vancomycin (VANCOCIN) 2,250 mg in sodium chloride 0.9 % 500 mL IVPB  Status:  Discontinued     2,250 mg 250 mL/hr over 120 Minutes Intravenous  Once 04/02/15 1015 04/02/15 1029   04/02/15 1030  piperacillin-tazobactam (ZOSYN) IVPB 3.375 g     3.375 g 100 mL/hr over 30 Minutes Intravenous  Once 04/02/15 1015 04/02/15 1212   03/27/15 2130  cefUROXime (ZINACEF) 1.5 g in dextrose 5 % 50 mL IVPB  Status:  Discontinued     1.5 g 100 mL/hr over 30 Minutes Intravenous Every 12 hours 03/27/15 2124 03/27/15 2132   03/27/15 1600  ceFAZolin (ANCEF) IVPB 2 g/50 mL premix     2 g 100 mL/hr over 30 Minutes Intravenous Every 8 hours 03/27/15 1322 03/28/15 0759   03/27/15 1447  ceFAZolin (ANCEF) 1-5 GM-% IVPB    Comments:  Merril Abbe   : cabinet override      03/27/15 1447 03/28/15 0259   03/27/15 0800  bacitracin 50,000 Units in sodium chloride irrigation 0.9 % 500 mL irrigation  Status:  Discontinued       As needed 03/27/15 0943 03/27/15 1223   03/27/15 0700  ceFAZolin (ANCEF) 3 g in dextrose 5 % 50 mL IVPB     3 g 130 mL/hr over 30 Minutes Intravenous To ShortStay Surgical 03/26/15 1409 03/27/15 0750      Assessment/Plan: s/p Procedure(s): ABDOMINAL WASHOUT (N/A) APPLICATION OF WOUND VAC (N/A) Continue current support. Plan return to the OR on Monday for gifted  abdominal closure   LOS: 8 days   Kristina Estes 04/04/2015, 7:42 AM

## 2015-04-04 NOTE — Progress Notes (Signed)
ANTICOAGULATION CONSULT NOTE - Follow Up Consult  Pharmacy Consult for Heparin  Indication: DVT  Allergies  Allergen Reactions  . Propoxyphene Other (See Comments)    Unspecified    Patient Measurements: Height:  (160 cm) Weight: 288 lb (130.636 kg) IBW/kg (Calculated) : 52.4 Heparin dosing wt: 84.5kg  Vital Signs: Temp: 99.2 F (37.3 C) (02/25 0758) Temp Source: Axillary (02/25 0758) BP: 100/56 mmHg (02/25 0700) Pulse Rate: 88 (02/25 0700)  Labs:  Recent Labs  04/02/15 0456 04/02/15 0730  04/03/15 0500 04/03/15 1203 04/04/15 0410  HGB 8.5*  --   --  9.0*  --  8.4*  HCT 26.3*  --   --  26.9*  --  26.8*  PLT 226  --   --  277  --  313  LABPROT  --  15.4*  --   --   --   --   INR  --  1.20  --   --   --   --   HEPARINUNFRC 0.41  --   < > 0.27* 0.40 0.38  CREATININE 0.60  --   --  0.78  --   --   < > = values in this interval not displayed.  Estimated Creatinine Clearance: 117.3 mL/min (by C-G formula based on Cr of 0.78).  Assessment: 45 y.o female with hemorraghic shock from large RP hematoma that occurred during elective operation. Found to have a DVT. Continuing on heparin per pharmacy. Ok to bolus per Dr. Imogene Burn.  Heparin level remains therapeutic 0.38 on 2300 units/h. Hg 8.4 stable, plt wnl. No bleed documented.  Goal of Therapy:  Heparin level 0.3-0.7 units/ml Monitor platelets by anticoagulation protocol: Yes   Plan:  Continue heparin at 2300 units/hr  Daily HL/CBC  Monitor for bleeding  Switch to lovenox when abdomen closed  F/u long-term [facility] plans after all OR trips completed, hopeful to close 2/27  Babs Bertin, PharmD, [facility] Clinical Pharmacist Pager [phone] 04/04/2015 10:38 AM

## 2015-04-04 NOTE — Progress Notes (Signed)
Lumbar dressing came off last night, re-dressed No acute events Stable Await abdomen closure

## 2015-04-05 LAB — CBC
HEMATOCRIT: 25.6 % — AB (ref 36.0–46.0)
HEMOGLOBIN: 8.2 g/dL — AB (ref 12.0–15.0)
MCH: 28.6 pg (ref 26.0–34.0)
MCHC: 32 g/dL (ref 30.0–36.0)
MCV: 89.2 fL (ref 78.0–100.0)
Platelets: 405 10*3/uL — ABNORMAL HIGH (ref 150–400)
RBC: 2.87 MIL/uL — ABNORMAL LOW (ref 3.87–5.11)
RDW: 14.3 % (ref 11.5–15.5)
WBC: 15.9 10*3/uL — AB (ref 4.0–10.5)

## 2015-04-05 LAB — BASIC METABOLIC PANEL
Anion gap: 8 (ref 5–15)
BUN: 17 mg/dL (ref 6–20)
CO2: 26 mmol/L (ref 22–32)
CREATININE: 0.83 mg/dL (ref 0.44–1.00)
Calcium: 8.1 mg/dL — ABNORMAL LOW (ref 8.9–10.3)
Chloride: 100 mmol/L — ABNORMAL LOW (ref 101–111)
GFR calc Af Amer: >60 mL/min (ref >60)
Glucose, Bld: 186 mg/dL — ABNORMAL HIGH (ref 65–99)
POTASSIUM: 3.7 mmol/L (ref 3.5–5.1)
SODIUM: 134 mmol/L — AB (ref 135–145)

## 2015-04-05 LAB — POTASSIUM
POTASSIUM: 3.9 mmol/L (ref 3.5–5.1)
Potassium: 3.6 mmol/L (ref 3.5–5.1)
Potassium: 4 mmol/L (ref 3.5–5.1)

## 2015-04-05 LAB — CULTURE, RESPIRATORY W GRAM STAIN

## 2015-04-05 LAB — CULTURE, RESPIRATORY

## 2015-04-05 LAB — GLUCOSE, CAPILLARY
GLUCOSE-CAPILLARY: 172 mg/dL — AB (ref 65–99)
GLUCOSE-CAPILLARY: 158 mg/dL — AB (ref 65–99)
Glucose-Capillary: 138 mg/dL — ABNORMAL HIGH (ref 65–99)
Glucose-Capillary: 141 mg/dL — ABNORMAL HIGH (ref 65–99)

## 2015-04-05 LAB — HEPARIN LEVEL (UNFRACTIONATED): HEPARIN UNFRACTIONATED: 0.43 [IU]/mL (ref 0.30–0.70)

## 2015-04-05 LAB — PHOSPHORUS: PHOSPHORUS: 3.2 mg/dL (ref 2.5–4.6)

## 2015-04-05 LAB — TRIGLYCERIDES: Triglycerides: 179 mg/dL — ABNORMAL HIGH (ref <150)

## 2015-04-05 MED ORDER — TRACE MINERALS CR-CU-MN-SE-ZN 10-1000-500-60 MCG/ML IV SOLN
INTRAVENOUS | Status: AC
  Administered 2015-04-05: 17:00:00 via INTRAVENOUS
  Filled 2015-04-05: qty 2640

## 2015-04-05 MED ORDER — FLEET ENEMA 7-19 GM/118ML RE ENEM
1.0000 | ENEMA | Freq: Once | RECTAL | Status: AC
  Administered 2015-04-05: 1 via RECTAL
  Filled 2015-04-05: qty 1

## 2015-04-05 NOTE — Progress Notes (Signed)
ANTICOAGULATION CONSULT NOTE - Follow Up Consult  Pharmacy Consult for Heparin  Indication: DVT  Allergies  Allergen Reactions  . Propoxyphene Other (See Comments)    Unspecified    Patient Measurements: Height:  (160 cm) Weight: 293 lb (132.904 kg) IBW/kg (Calculated) : 52.4 Heparin dosing wt: 84.5kg  Vital Signs: Temp: 98.8 F (37.1 C) (02/26 0831) Temp Source: Axillary (02/26 0831) BP: 89/51 mmHg (02/26 0831) Pulse Rate: 85 (02/26 0831)  Labs:  Recent Labs  04/03/15 0500 04/03/15 1203 04/04/15 0410 04/05/15 0410  HGB 9.0*  --  8.4* 8.2*  HCT 26.9*  --  26.8* 25.6*  PLT 277  --  313 405*  HEPARINUNFRC 0.27* 0.40 0.38 0.43  CREATININE 0.78  --   --  0.83    Estimated Creatinine Clearance: 114.3 mL/min (by C-G formula based on Cr of 0.83).  Assessment: 45 y.o female with hemorraghic shock from large RP hematoma that occurred during elective operation. Found to have a DVT. Continuing on heparin per pharmacy. Ok to bolus per Dr. Imogene Burn.  Heparin level remains therapeutic 0.43 on 2300 units/h. Hg 8.2 stable, plt ok. No bleed documented.  Goal of Therapy:  Heparin level 0.3-0.7 units/ml Monitor platelets by anticoagulation protocol: Yes   Plan:  Continue heparin at 2300 units/hr  Daily HL/CBC  Monitor for bleeding  Switch to lovenox when abdomen closed  F/u long-term [facility] plans after all OR trips completed, hopeful to close 2/27  Babs Bertin, PharmD, BCPS Clinical Pharmacist Pager [phone] 04/05/2015 11:22 AM

## 2015-04-05 NOTE — Progress Notes (Signed)
Subjective: Interval History: none.. No bowel movement since yesterday. Opens eyes on vent  Objective: Vital signs in last 24 hours: Temp:  [99.4 F (37.4 C)-100 F (37.8 C)] 99.4 F (37.4 C) (02/26 0339) Pulse Rate:  [77-104] 77 (02/26 0600) Resp:  [20-28] 23 (02/26 0600) BP: (84-133)/(40-81) 84/40 mmHg (02/26 0600) SpO2:  [100 %] 100 % (02/26 0600) Arterial Line BP: (75-133)/(54-89) 116/69 mmHg (02/26 0600) FiO2 (%):  [30 %] 30 % (02/26 0400) Weight:  [293 lb (132.904 kg)] 293 lb (132.904 kg) (02/26 0000)  Intake/Output from previous day: 02/25 0701 - 02/26 0700 In: 4356.8 [I.V.:1406.8; NG/GT:30; IV Piggyback:500; TPN:2420] Out: 3580 [Urine:2630; Emesis/NG output:300; Drains:650] Intake/Output this shift:    Abdomen soft nontender. VAC in place. 2-3+ dorsalis pedis pulses bilaterally  Lab Results:  Recent Labs  04/04/15 0410 04/05/15 0410  WBC 16.3* 15.9*  HGB 8.4* 8.2*  HCT 26.8* 25.6*  PLT 313 405*   BMET  Recent Labs  04/03/15 0500  04/05/15 0026 04/05/15 0410  NA 134*  --   --  134*  K 4.0  < > 3.6 3.7  CL 105  --   --  100*  CO2 24  --   --  26  GLUCOSE 185*  --   --  186*  BUN 17  --   --  17  CREATININE 0.78  --   --  0.83  CALCIUM 8.0*  --   --  8.1*  < > = values in this interval not displayed.  Studies/Results: Ct Abdomen Pelvis Wo Contrast  03/27/2015  CLINICAL DATA:  45 year old who became acutely hypotensive intraoperatively during L5-S1 posterior decompression and discectomy earlier today. EXAM: CT ABDOMEN AND PELVIS WITHOUT CONTRAST TECHNIQUE: Multidetector CT imaging of the abdomen and pelvis was performed following the standard protocol without IV contrast. COMPARISON:  None. FINDINGS: Lower chest: Blurred by respiratory motion. Mild dependent atelectasis posteriorly in the lower lobes. Visualized lung bases otherwise clear. Normal heart size. Hepatobiliary: Beam hardening streak artifact as the patient was unable to raise the arms. Allowing  for this, normal unenhanced appearance of the liver. Gallbladder surgically absent. No unexpected biliary ductal dilation. Pancreas: Normal unenhanced appearance. Spleen: Normal unenhanced appearance. Adrenals/Urinary Tract: Normal appearing adrenal glands. Unenhanced appearance of the right kidney. Acute perinephric and subcapsular hematoma involving the left kidney. No visible focal parenchymal abnormality involving the left kidney, allowing for the unenhanced technique. Urinary bladder decompressed by Foley catheter. Stomach/Bowel: Stomach normal in appearance for the degree of distention. Normal-appearing small bowel. Normal-appearing colon with expected stool burden. Normal appendix in the right upper pelvis. Vascular/Lymphatic: No visible aortoiliofemoral atherosclerosis. No pathologic lymphadenopathy. Reproductive: Normal-appearing uterus and ovaries without evidence of adnexal mass. Other: Large retroperitoneal hematoma involving predominantly the left side of the abdomen and pelvis, extending from the level of the upper pole of the left kidney inferiorly to the low pelvis, maximum measurements approximating 7.4 x 9.9 x 17.0 cm. The largest portion of the hematoma is anterior to the left psoas muscle. There is a smaller component of the retroperitoneal hematoma in the right side of the abdomen. The retroperitoneal hematoma displaces the left kidney anteriorly. A small amount of intraperitoneal hemorrhage is present in the paracolic gutters, left greater than right. Musculoskeletal: Postsurgical changes related to L5-S1 discectomy and posterior decompression at L5. Gas bubbles in the surgical tract, the epidural space and the L5-S1 disc. No abnormal fluid collection to suggest abscess or leak. IMPRESSION: 1. Large retroperitoneal hematoma involving predominantly the left  side of the abdomen and pelvis, maximum measurements given above. 2. Subcapsular left renal hematoma and left perinephric hematoma,  contiguous with the retroperitoneal hematoma. 3. Small amount of intraperitoneal hemorrhage in the paracolic gutters, left greater than right. 4. Expected postsurgical changes at L5-S1. Given the fact that the patient has a subcapsular left renal hematoma and a left perinephric hematoma, the possible etiology of the retroperitoneal hematoma may be due to a lesion in the left kidney. Repeat CT of the abdomen and pelvis with contrast may be helpful once the patient has recovered from surgery to exclude that possibility. These results were discussed directly with Dr. Conchita Paris at the time of interpretation 03/27/2015 at 1 o'clock p.m. Electronically Signed   By: Hulan Saas M.D.   On: 03/27/2015 13:27   US Abdomen Limited  03/27/2015  CLINICAL DATA:  Intraoperative exam to assess for abdominal/pelvic fluid/ hematoma. CT scan after this ultrasound reveals large retroperitoneal hematoma left-greater-than-right as well as left perinephric and subscapular hematoma of which Dr.Nundkumar is aware. EXAM: LIMITED ABDOMINAL ULTRASOUND COMPARISON:  CT 03/27/2015 FINDINGS: Limited all intraoperative ultrasound evaluation of the bilateral lower quadrants demonstrates no definite focal free fluid collection. IMPRESSION: No definite focal fluid collection within the lower quadrants. Electronically Signed   By: Elberta Fortis M.D.   On: 03/27/2015 14:13   Dg Chest Port 1 View  04/04/2015  CLINICAL DATA:  Pulmonary edema EXAM: PORTABLE CHEST 1 VIEW COMPARISON:  April 02, 2015 FINDINGS: The right IJ has been removed. ET tube is in good position. No pneumothorax. No pulmonary nodules, masses, infiltrates, or overt edema. IMPRESSION: No pulmonary edema.  ET tube in good position. Electronically Signed   By: Gerome Sam III M.D   On: 04/04/2015 12:22   Dg Chest Port 1 View  04/02/2015  CLINICAL DATA:  Respiratory failure.  Preop EXAM: PORTABLE CHEST 1 VIEW COMPARISON:  04/01/2015 FINDINGS: Endotracheal tube tip at the  clavicular heads. There are bilateral neck catheters with tips in stable position. The left neck catheter overlaps arterial and venous structures as before. An orogastric tube reaches the stomach at least. Low volumes with perihilar density, streaky at the right base. No edema, effusion, or pneumothorax. IMPRESSION: 1. Stable positioning of tubes and lines. 2. Unchanged low volumes with perihilar atelectasis. Electronically Signed   By: Marnee Spring M.D.   On: 04/02/2015 11:02   Dg Chest Port 1 View  04/01/2015  CLINICAL DATA:  45 year old female with ventilator dependent respiratory failure. Hemorrhagic shock. Initial encounter. EXAM: PORTABLE CHEST 1 VIEW COMPARISON:  03/28/2015 and earlier FINDINGS: Portable AP semi upright view at 0616 hours. Stable endotracheal tube tip just below the clavicles. Stable right IJ central line. Enteric tube courses to the abdomen, tip not included. Stable left IJ central line. Mildly improved lung volumes. Mediastinal contours remain normal. No pneumothorax or pulmonary edema. No pleural effusion identified. Left greater than right lung base atelectasis or consolidation has mildly regressed since 03/28/2015. No areas of worsening ventilation. IMPRESSION: 1.  Stable lines and tubes. 2. Mildly improved ventilation since 03/28/2015 with regressed left greater than right bibasilar atelectasis/consolidation. Electronically Signed   By: Odessa Fleming M.D.   On: 04/01/2015 07:40   Portable Chest Xray  03/28/2015  CLINICAL DATA:  Intubation EXAM: PORTABLE CHEST 1 VIEW COMPARISON:  Yesterday FINDINGS: Tubular devices are stable. Cardiomegaly is stable. Hazy opacity throughout the left hemi thorax has increased which may represent hazy airspace disease or layering pleural fluid. Vascularity is unremarkable. IMPRESSION: Increasing AC  opacity throughout the left hemi thorax as described representing either hazy airspace disease or layering pleural fluid. Electronically Signed   By: Jolaine Click M.D.   On: 03/28/2015 08:56   Dg Chest Port 1 View  03/27/2015  CLINICAL DATA:  Open wound in the anterior abdomen.  Intubated. EXAM: PORTABLE CHEST 1 VIEW COMPARISON:  None. FINDINGS: Endotracheal tube tip is 2.3 cm above the carina. Enteric tube enters the stomach, with the tip not seen on this image. Right internal jugular central venous catheter terminates in the middle third of the superior vena cava. Left internal jugular central venous sheath is noted with the tip overlying the left superior mediastinum, probably within the lower left internal jugular vein or left brachiocephalic vein. Top-normal heart size and normal mediastinal contour accounting for low lung volumes. No pneumothorax. No pleural effusion. Prominence of the interstitial markings probably represents vascular crowding due to low lung volumes. There is left lower lobe opacity. IMPRESSION: 1. Well-positioned support hardware as described. 2. Low lung volumes. Left lower lobe opacity likely represents atelectasis. Electronically Signed   By: Delbert Phenix M.D.   On: 03/27/2015 22:20   Dg Abd Portable 1v  03/27/2015  CLINICAL DATA:  45 year old female status post -abdominal surgery. Incorrect surgical device count EXAM: PORTABLE ABDOMEN - 1 VIEW COMPARISON:  CT dated 03/27/2015 FINDINGS: Evaluation is limited due to soft tissue attenuation by body habitus as well as portable technique. Support lines noted overlying the left upper abdomen. Right upper quadrant cholecystectomy clips seen. Multiple cutaneous surgical clips noted in the lower abdomen to the right of the spine. No other radiopaque foreign object identified. There is no evidence of bowel obstruction. No radiopaque calculi. The osseous structures are grossly unremarkable. IMPRESSION: No definite evidence of radiopaque surgical foreign object These results were called by telephone at the time of interpretation on 03/27/2015 at 8:08 pm to Nurse Julien Girt, who verbally acknowledged  these results. Electronically Signed   By: Elgie Collard M.D.   On: 03/27/2015 20:11   Dg C-arm 1-60 Min-no Report  03/27/2015  CLINICAL DATA: plif 5-s1 C-ARM 1-60 MINUTES Fluoroscopy was utilized by the requesting physician.  No radiographic interpretation.   Anti-infectives: Anti-infectives    Start     Dose/Rate Route Frequency Ordered Stop   04/02/15 2330  vancomycin (VANCOCIN) IVPB 1000 mg/200 mL premix     1,000 mg 200 mL/hr over 60 Minutes Intravenous Every 12 hours 04/02/15 1015     04/02/15 1830  piperacillin-tazobactam (ZOSYN) IVPB 3.375 g     3.375 g 12.5 mL/hr over 240 Minutes Intravenous 3 times per day 04/02/15 1015     04/02/15 1100  cefUROXime (ZINACEF) 1.5 g in dextrose 5 % 50 mL IVPB  Status:  Discontinued     1.5 g 100 mL/hr over 30 Minutes Intravenous To ShortStay Surgical 04/01/15 1106 04/02/15 1448   04/02/15 1100  vancomycin (VANCOCIN) 2,500 mg in sodium chloride 0.9 % 500 mL IVPB     2,500 mg 250 mL/hr over 120 Minutes Intravenous  Once 04/02/15 1029 04/02/15 1342   04/02/15 1030  vancomycin (VANCOCIN) 2,250 mg in sodium chloride 0.9 % 500 mL IVPB  Status:  Discontinued     2,250 mg 250 mL/hr over 120 Minutes Intravenous  Once 04/02/15 1015 04/02/15 1029   04/02/15 1030  piperacillin-tazobactam (ZOSYN) IVPB 3.375 g     3.375 g 100 mL/hr over 30 Minutes Intravenous  Once 04/02/15 1015 04/02/15 1212   03/27/15 2130  cefUROXime (ZINACEF)  1.5 g in dextrose 5 % 50 mL IVPB  Status:  Discontinued     1.5 g 100 mL/hr over 30 Minutes Intravenous Every 12 hours 03/27/15 2124 03/27/15 2132   03/27/15 1600  ceFAZolin (ANCEF) IVPB 2 g/50 mL premix     2 g 100 mL/hr over 30 Minutes Intravenous Every 8 hours 03/27/15 1322 03/28/15 0759   03/27/15 1447  ceFAZolin (ANCEF) 1-5 GM-% IVPB    Comments:  Merril Abbe   : cabinet override      03/27/15 1447 03/28/15 0259   03/27/15 0800  bacitracin 50,000 Units in sodium chloride irrigation 0.9 % 500 mL irrigation  Status:   Discontinued       As needed 03/27/15 0943 03/27/15 1223   03/27/15 0700  ceFAZolin (ANCEF) 3 g in dextrose 5 % 50 mL IVPB     3 g 130 mL/hr over 30 Minutes Intravenous To ShortStay Surgical 03/26/15 1409 03/27/15 0750      Assessment/Plan: s/p Procedure(s): ABDOMINAL WASHOUT (N/A) APPLICATION OF WOUND VAC (N/A) Schedule for reexploration and attempted closure of her abdomen tomorrow.   LOS: 9 days   Kentrell Hallahan 04/05/2015, 8:05 AM

## 2015-04-05 NOTE — Progress Notes (Signed)
Pharmacy Antibiotic Note  Kristina Estes is a 45 y.o. female admitted on 03/27/2015 with r/o HAP/abdominal infxn.  Pharmacy has been consulted for zosyn dosing - day #4. AF, WBC 15.9 (stable), CrCl>100. Hopefully close abdomen tomorrow.  Plan: Zosyn 3.375g IV q8h (4h inf) Monitor clinical progress, c/s, renal function, abx plan/LOT  Height:  (160 cm) Weight: 293 lb (132.904 kg) IBW/kg (Calculated) : 52.4  Temp (24hrs), Avg:99.6 F (37.6 C), Min:98.8 F (37.1 C), Max:100 F (37.8 C)   Recent Labs Lab 03/31/15 0730 04/01/15 0417 04/02/15 0456 04/02/15 1100 04/03/15 0500 04/04/15 0410 04/05/15 0410  WBC  --  10.0 11.9*  --  15.3* 16.3* 15.9*  CREATININE 0.64 0.72 0.60  --  0.78  --  0.83  LATICACIDVEN  --   --   --  0.8  --   --   --     Estimated Creatinine Clearance: 114.3 mL/min (by C-G formula based on Cr of 0.83).    Allergies  Allergen Reactions  . Propoxyphene Other (See Comments)    Unspecified    Antimicrobials this admission: 2/23 vanc>>2/26 2/23 zosyn>>  Dose adjustments this admission: n/a  Microbiology results: 2/23 BCx2>>ngtd 2/23 sputum>>few yeast (candida) MRSA pcr: neg  Babs Bertin, PharmD, [place] Clinical Pharmacist Pager [phone] 04/05/2015 11:27 AM

## 2015-04-05 NOTE — Progress Notes (Signed)
No changes To OR with vascular surgery tomorrow Call with questions

## 2015-04-05 NOTE — Progress Notes (Signed)
Elink made aware of low systolic bp 88-95 while coming in on camera rounds. Dr. Darrick Penna states that she is ok with current BP. Will continue to closely monitor. Modena Jansky RN [address] [place]

## 2015-04-05 NOTE — Progress Notes (Signed)
PULMONARY / CRITICAL CARE MEDICINE   Name: Kristina Estes MRN: [id number] DOB: 1970-06-16    ADMISSION DATE:  03/27/2015  CONSULTATION DATE: 03/27/15  REFERRING MD: Conchita Paris  CHIEF COMPLAINT: Hemorrhagic shock  HISTORY OF PRESENT ILLNESS:  Kristina Estes is a 45 year old with history of asthma, arthritis, obesity. She had an elective admission today for L5 laminectomy. Intra-Op she went into shock, hemodynamic instability. Subsequent CT showed large left retroperitoneal hematoma. She was put on 2 pressors (norepi, epinephrine) and transferred to ICU. PCCM consulted for help with management. Vascular surgery was also consulted, underwent repair of L CIV with bypass to CFA. Intra OP TEE showed normal cardiac function.  SUBJECTIVE:  Awake, c/o expected abd pain. No evidence of distress.   VITAL SIGNS: BP 89/51 mmHg  Pulse 85  Temp(Src) 98.8 F (37.1 C) (Axillary)  Resp 27  Ht  (1.6 m)  Wt 293 lb (132.904 kg)  BMI 51.92 kg/m2  SpO2 100%  LMP 03/27/2015  HEMODYNAMICS:    VENTILATOR SETTINGS: Vent Mode:  [-] PRVC FiO2 (%):  [30 %] 30 % Set Rate:  [20 bmp] 20 bmp Vt Set:  [450 mL] 450 mL PEEP:  [5 cmH20] 5 cmH20 Plateau Pressure:  [22 cmH20-29 cmH20] 29 cmH20  INTAKE / OUTPUT: I/O last 3 completed shifts: In: 6813.1 [I.V.:2193.1; NG/GT:90; IV Piggyback:900] Out: 5760 [Urine:3910; Emesis/NG output:800; Drains:1050]   PHYSICAL EXAMINATION: General: awake, c/o abd pain, no other clear distress Neuro: CN grossly intact. Pt is sedated so limited exam.  HEENT: PERLA, (+) corneals; orally intubated. Both neck line dressings are out Cardiovascular: Good si/s2; (-) s3, m,r,g Lungs: Dec BS BLS. No accessory muscle use Abdomen: Dec BS. (+) open abd. (+) surgical dressing. Very diminished BS.  Musculoskeletal: Gr 2 edema. less edema this am.  Skin: Skin was warm and dry  LABS:  BMET  Recent Labs Lab 04/02/15 0456  04/03/15 0500  04/04/15 1805  04/05/15 0026 04/05/15 0410  NA 138  --  134*  --   --   --  134*  K 3.6  < > 4.0  < > 3.7 3.6 3.7  CL 104  --  105  --   --   --  100*  CO2 24  --  24  --   --   --  26  BUN 14  --  17  --   --   --  17  CREATININE 0.60  --  0.78  --   --   --  0.83  GLUCOSE 156*  --  185*  --   --   --  186*  < > = values in this interval not displayed.  Electrolytes  Recent Labs Lab 03/31/15 0730 04/01/15 0417 04/02/15 0456 04/03/15 0500 04/05/15 0410  CALCIUM 8.0* 8.3* 8.5* 8.0* 8.1*  MG 1.8 1.8 1.9  --   --   PHOS 2.2* 3.0 2.8  --  3.2    CBC  Recent Labs Lab 04/03/15 0500 04/04/15 0410 04/05/15 0410  WBC 15.3* 16.3* 15.9*  HGB 9.0* 8.4* 8.2*  HCT 26.9* 26.8* 25.6*  PLT 277 313 405*    Coag's  Recent Labs Lab 04/02/15 0730  INR 1.20    Sepsis Markers  Recent Labs Lab 04/02/15 1100  LATICACIDVEN 0.8    ABG  Recent Labs Lab 03/30/15 0324  PHART 7.455*  PCO2ART 31.3*  PO2ART 133.0*    Liver Enzymes  Recent Labs Lab 04/01/15 0417 04/02/15 0456  AST 16 46*  ALT 13* 29  ALKPHOS 43 44  BILITOT 0.8 1.0  ALBUMIN 2.1* 2.1*    Cardiac Enzymes No results for input(s): TROPONINI, PROBNP in the last 168 hours.  Glucose  Recent Labs Lab 04/03/15 2330 04/04/15 0540 04/04/15 1235 04/04/15 1832 04/04/15 2356 04/05/15 0616  GLUCAP 169* 164* 123* 114* 141* 172*    Imaging Dg Chest Port 1 View  04/04/2015  CLINICAL DATA:  Pulmonary edema EXAM: PORTABLE CHEST 1 VIEW COMPARISON:  April 02, 2015 FINDINGS: The right IJ has been removed. ET tube is in good position. No pneumothorax. No pulmonary nodules, masses, infiltrates, or overt edema. IMPRESSION: No pulmonary edema.  ET tube in good position. Electronically Signed   By: Gerome Sam III M.D   On: 04/04/2015 12:22   STUDIES:  CT abd 2/17 >> Large RP hematoma with renal, perinephric hematoma Intra op TEE 2/17 >> Normal cardiac function by verbal report.   CULTURES: MRSA screen (2/18)  (-) Blood culture (2/23) >> Sputum culture (2/23) >>    ANTIBIOTICS: Cefazolin 2/17 >> 2/18 Vanc 2/23 >>2/26 Zosyn 2/23 >>  SIGNIFICANT EVENTS: 2/17 - Admit for L5 laminectomy >> hemorrhagic shock from RP bleed.  2/25: CVL out.  LINES/TUBES: RtPICC 2/25>>>  IJ 2/17 >> 2/25  DISCUSSION: 45 y/o with hemorraghic shock from large RP hematoma that occurred during operation for L5 laminectomy, s/p L CIV repair and L iliac artery bypass. Will cont supportive care. Plan for back to OR Monday. Will keep comfortable on vent for now. Plan for back to OR 2/27  ASSESSMENT / PLAN:  PULMONARY A: Acute Hypoxemic Hypercapneic Respiratory Failure 2 to Hemorrhagic Shock, Unable to Protect the Airway, Pulmonary Edema, RVD 2 to Morbid Obesity. History of asthma -- seems stable.  Possible OSA Concern for HCAP given fever (2/23). CXR is not impressive for HCAP.  P:  --cont vent support for now. On 30% Fio2. doing fair on daily PST. Doing daily wean trials to exercise pt. Pt is for another OR on 2/27.  Hopefully, we can extubate 24-48 hrs after.  --f/u cxr in am  --see ID section --cont diuresis as BP/BUN/cr allow  CARDIOVASCULAR A:  S/P Hemorrhagic Shock. Off pressors. Hb has been stable. Pulm edema -- better.  S/p Iliac artery repair P:  --cont diuresis. Correct K, Mg.  --cont tele   RENAL A:  Volume overload  P:  --cont diuresis --strict I&O --replace K, Mg   GASTROINTESTINAL A:  Malnutrition, likely moderate.  Constipation --pt had OR on 2/23 for abd closure but her gut was full of stool and had edema so they were not able to close.  Plan --plan to start reglan qid via NGT. Spoke with dr. Imogene Burn -- OK with it. . Need to decompress abd. May need enema, or erythromycin, etc.  --PUD prophylaxis --cont TPN. --cut down on opiates  HEMATOLOGIC A:  S/P Hemorrhagic Shock with large RP bleed. Hb and hct have been stable. LUE DVT P:  --Hb and hct have been. No blood  transfusion last 4d.  --cont heparin drip --plan for surgery on 2/27 for abd closure. Needs to move her bowels prior to surgery.   INFECTIOUS A:  R/O HCAP. D7 on the vent R/O Abd infxn P:  --Cont vanc and zosyn (D4) >> will d.c vanc --f/u sputum and blood cultures >>   ENDOCRINE A:  No issues P:  --keep BS < 180 mg% --on SSI  NEUROLOGIC A:  Post op pain P:  --RASS goal: -1 --comfortable  on precedex drip.  --cont fentanyl drip.     FAMILY  - Updates: family updated.   - Inter-disciplinary family meet or Palliative Care meeting due by: 2/29  Simonne Martinet ACNP-BC [facility] Pulmonary/Critical Care Pager # [phone] OR # [phone] if no answer      04/05/2015, 8:44 AM

## 2015-04-05 NOTE — Progress Notes (Signed)
PARENTERAL NUTRITION CONSULT NOTE - FOLLOW UP  Pharmacy Consult:  TPN Indication:  Open abdomen  Allergies  Allergen Reactions  . Propoxyphene Other (See Comments)    Unspecified    Patient Measurements: Height:  (160 cm) Weight: 293 lb (132.904 kg) IBW/kg (Calculated) : 52.4  BMI = 59.7  Vital Signs: Temp: 99.4 F (37.4 C) (02/26 0339) Temp Source: Oral (02/26 0339) BP: 84/40 mmHg (02/26 0600) Pulse Rate: 77 (02/26 0600) Intake/Output from previous day: 02/25 0701 - 02/26 0700 In: 4356.8 [I.V.:1406.8; NG/GT:30; IV Piggyback:500; TPN:2420] Out: 3580 [Urine:2630; Emesis/NG output:300; Drains:650]  Labs:  Recent Labs  04/02/15 0730 04/03/15 0500 04/04/15 0410 04/05/15 0410  WBC  --  15.3* 16.3* 15.9*  HGB  --  9.0* 8.4* 8.2*  HCT  --  26.9* 26.8* 25.6*  PLT  --  277 313 405*  INR 1.20  --   --   --      Recent Labs  04/03/15 0500  04/04/15 1805 04/05/15 0026 04/05/15 0410  NA 134*  --   --   --  134*  K 4.0  < > 3.7 3.6 3.7  CL 105  --   --   --  100*  CO2 24  --   --   --  26  GLUCOSE 185*  --   --   --  186*  BUN 17  --   --   --  17  CREATININE 0.78  --   --   --  0.83  CALCIUM 8.0*  --   --   --  8.1*  PHOS  --   --   --   --  3.2  TRIG  --   --   --   --  179*  < > = values in this interval not displayed. Estimated Creatinine Clearance: 114.3 mL/min (by C-G formula based on Cr of 0.83).    Recent Labs  04/04/15 1832 04/04/15 2356 04/05/15 0616  GLUCAP 114* 141* 172*     Insulin Requirements in the past 24 hours:  7 units SSI  Assessment: 44 YOF admitted on 03/27/15 with back and leg pain and underwent L5 laminectomy with facetectomy later that day.  Intraoperatively patient went into shock and CT revealed large left RP hematoma.  Patient returned to the OR for ex-lap with repair of left iliac vein, ligation of distal left CIA, left CIA-CFA bypass, and placement of abdominal VAC.  Pharmacy consulted to manage TPN for nutritional  support.  GI: hx GERD / obesity.  Baseline prealbumin low at 8.4.  Frequent OR visits for abd washout (2/19, 2/21) - too edematous for abd closure on 2/23, return to OR Mon 2/27.  NG/emesis up to , drain O/P 700 ml.  PPI IV, PO docusate (not given). On scheduled metoclopramide   Endo: no hx DM - hypoglycemic prior to TPN initiation, CBGs now controlled at 123-172 Lytes: lytes WNL, except Na 134 Renal: SCr stable, Na 134, CrCL > 100 ml/min - UOP 0.8 ml/kg/hr, D5NS at 10 ml/hr Pulm: intubated, FiO2 30% Cards: angina - BP low - Lasix IV (dose reduced to daily), net + 817 mL AC: Heparin for new left femoral DVT (dx 03/29/15) - hgb stable at 8.2, plts 405 Hepatobil:LFTs WNL, tbili 1.  TG 72>>179 Neuro: Propofol >> Precedex, Fentanyl gtt - GCS 13, CPOT 0 ID: Afebrile, WBC 16.3>>15.9 - not on abx (now with pre-op abx)  Best Practices: heparin gtt, MC TPN Access: right IJ  placed 03/27/15 TPN start date: 2/21 >>  Current Nutrition:  TPN  Nutritional Goals:  1450-1890 kCal, 130-140 grams of protein per day  Plan:  - Continue Clinimix E 5/15 to 110 ml/hr (goal rate 110 ml/hr).  Clinimix providing 1874 kCal and 132gm protein per day and meeting 100% of needs. - Hold lipid emulsion in critically ill patients for the first 7 days of TPN per SCCM/ASPEN guidelines (D#6/7)  - Daily multivitamin and trace elements - F/u TPN labs tomorrow  - Continue moderate SSI Q6H - Consider cyclic TPN on Mon after abdomen closure?  Vinnie Level, PharmD., BCPS Clinical Pharmacist Pager [phone]

## 2015-04-06 ENCOUNTER — Inpatient Hospital Stay (HOSPITAL_COMMUNITY): Payer: PRIVATE HEALTH INSURANCE | Admitting: Certified Registered Nurse Anesthetist

## 2015-04-06 ENCOUNTER — Encounter (HOSPITAL_COMMUNITY)
Admission: RE | Disposition: A | Discharge status: Skilled Nursing Facility | Payer: Self-pay | Source: Ambulatory Visit | Attending: Neurosurgery

## 2015-04-06 ENCOUNTER — Encounter (HOSPITAL_COMMUNITY): Payer: Self-pay | Admitting: Certified Registered Nurse Anesthetist

## 2015-04-06 HISTORY — PX: APPLICATION OF WOUND VAC: SHX5189

## 2015-04-06 HISTORY — PX: ABDOMINAL WOUND DEHISCENCE: SHX540

## 2015-04-06 LAB — COMPREHENSIVE METABOLIC PANEL
ALBUMIN: 1.5 g/dL — AB (ref 3.5–5.0)
ALK PHOS: 78 U/L (ref 38–126)
ALT: 76 U/L — ABNORMAL HIGH (ref 14–54)
ANION GAP: 9 (ref 5–15)
AST: 67 U/L — ABNORMAL HIGH (ref 15–41)
BILIRUBIN TOTAL: 4.8 mg/dL — AB (ref 0.3–1.2)
BUN: 18 mg/dL (ref 6–20)
CALCIUM: 8 mg/dL — AB (ref 8.9–10.3)
CO2: 27 mmol/L (ref 22–32)
Chloride: 97 mmol/L — ABNORMAL LOW (ref 101–111)
Creatinine, Ser: 0.84 mg/dL (ref 0.44–1.00)
GFR calc Af Amer: >60 mL/min (ref >60)
GLUCOSE: 179 mg/dL — AB (ref 65–99)
Potassium: 3.5 mmol/L (ref 3.5–5.1)
Sodium: 133 mmol/L — ABNORMAL LOW (ref 135–145)
TOTAL PROTEIN: 5.8 g/dL — AB (ref 6.5–8.1)

## 2015-04-06 LAB — GLUCOSE, CAPILLARY
GLUCOSE-CAPILLARY: 169 mg/dL — AB (ref 65–99)
Glucose-Capillary: 147 mg/dL — ABNORMAL HIGH (ref 65–99)
Glucose-Capillary: 174 mg/dL — ABNORMAL HIGH (ref 65–99)
Glucose-Capillary: 185 mg/dL — ABNORMAL HIGH (ref 65–99)
Glucose-Capillary: 205 mg/dL — ABNORMAL HIGH (ref 65–99)

## 2015-04-06 LAB — CBC
HCT: 25.1 % — ABNORMAL LOW (ref 36.0–46.0)
Hemoglobin: 7.9 g/dL — ABNORMAL LOW (ref 12.0–15.0)
MCH: 28.1 pg (ref 26.0–34.0)
MCHC: 31.5 g/dL (ref 30.0–36.0)
MCV: 89.3 fL (ref 78.0–100.0)
PLATELETS: 455 10*3/uL — AB (ref 150–400)
RBC: 2.81 MIL/uL — ABNORMAL LOW (ref 3.87–5.11)
RDW: 14.6 % (ref 11.5–15.5)
WBC: 17.3 10*3/uL — AB (ref 4.0–10.5)

## 2015-04-06 LAB — TRIGLYCERIDES: Triglycerides: 159 mg/dL — ABNORMAL HIGH (ref <150)

## 2015-04-06 LAB — PHOSPHORUS: Phosphorus: 3.1 mg/dL (ref 2.5–4.6)

## 2015-04-06 LAB — DIFFERENTIAL
BASOS PCT: 0 %
Basophils Absolute: 0 10*3/uL (ref 0.0–0.1)
EOS ABS: 0.7 10*3/uL (ref 0.0–0.7)
Eosinophils Relative: 4 %
Lymphocytes Relative: 10 %
Lymphs Abs: 1.7 10*3/uL (ref 0.7–4.0)
MONO ABS: 2.1 10*3/uL — AB (ref 0.1–1.0)
Monocytes Relative: 12 %
NEUTROS ABS: 12.8 10*3/uL — AB (ref 1.7–7.7)
NEUTROS PCT: 74 %

## 2015-04-06 LAB — PREALBUMIN: Prealbumin: 9.7 mg/dL — ABNORMAL LOW (ref 18–38)

## 2015-04-06 LAB — MAGNESIUM: MAGNESIUM: 2 mg/dL (ref 1.7–2.4)

## 2015-04-06 LAB — POTASSIUM
POTASSIUM: 4.2 mmol/L (ref 3.5–5.1)
Potassium: 3.5 mmol/L (ref 3.5–5.1)
Potassium: 3.8 mmol/L (ref 3.5–5.1)

## 2015-04-06 LAB — HEPARIN LEVEL (UNFRACTIONATED): Heparin Unfractionated: 0.43 IU/mL (ref 0.30–0.70)

## 2015-04-06 SURGERY — REPAIR, DEHISCENCE, WOUND, ABDOMEN
Anesthesia: General | Site: Abdomen

## 2015-04-06 MED ORDER — HEPARIN (PORCINE) IN NACL 100-0.45 UNIT/ML-% IJ SOLN
2300.0000 [IU]/h | INTRAMUSCULAR | Status: DC
  Administered 2015-04-06 – 2015-04-10 (×7): 2300 [IU]/h via INTRAVENOUS
  Filled 2015-04-06 (×12): qty 250

## 2015-04-06 MED ORDER — FENTANYL CITRATE (PF) 2500 MCG/50ML IJ SOLN
2500.0000 ug | INTRAMUSCULAR | Status: DC | PRN
  Administered 2015-04-06: 75 ug/h via INTRAVENOUS

## 2015-04-06 MED ORDER — PHENYLEPHRINE HCL 10 MG/ML IJ SOLN
INTRAMUSCULAR | Status: DC | PRN
  Administered 2015-04-06 (×3): 80 ug via INTRAVENOUS

## 2015-04-06 MED ORDER — ROCURONIUM BROMIDE 100 MG/10ML IV SOLN
INTRAVENOUS | Status: DC | PRN
  Administered 2015-04-06 (×2): 50 mg via INTRAVENOUS

## 2015-04-06 MED ORDER — INSULIN ASPART 100 UNIT/ML ~~LOC~~ SOLN
0.0000 [IU] | SUBCUTANEOUS | Status: DC
  Administered 2015-04-06 (×2): 3 [IU] via SUBCUTANEOUS
  Administered 2015-04-06: 5 [IU] via SUBCUTANEOUS
  Administered 2015-04-07: 3 [IU] via SUBCUTANEOUS
  Administered 2015-04-07: 2 [IU] via SUBCUTANEOUS
  Administered 2015-04-07 (×3): 3 [IU] via SUBCUTANEOUS
  Administered 2015-04-07 – 2015-04-08 (×3): 2 [IU] via SUBCUTANEOUS
  Administered 2015-04-08: 3 [IU] via SUBCUTANEOUS
  Administered 2015-04-08 (×2): 2 [IU] via SUBCUTANEOUS
  Administered 2015-04-09 (×2): 3 [IU] via SUBCUTANEOUS
  Administered 2015-04-09: 2 [IU] via SUBCUTANEOUS
  Administered 2015-04-09 – 2015-04-10 (×6): 3 [IU] via SUBCUTANEOUS

## 2015-04-06 MED ORDER — LACTATED RINGERS IV SOLN
INTRAVENOUS | Status: DC | PRN
Start: 2015-04-06 — End: 2015-04-06
  Administered 2015-04-06: 10:00:00 via INTRAVENOUS

## 2015-04-06 MED ORDER — FENTANYL CITRATE (PF) 250 MCG/5ML IJ SOLN
INTRAMUSCULAR | Status: AC
  Filled 2015-04-06: qty 5

## 2015-04-06 MED ORDER — 0.9 % SODIUM CHLORIDE (POUR BTL) OPTIME
TOPICAL | Status: DC | PRN
  Administered 2015-04-06: 2000 mL

## 2015-04-06 MED ORDER — PROPOFOL 10 MG/ML IV BOLUS
INTRAVENOUS | Status: DC | PRN
  Administered 2015-04-06: 100 mg via INTRAVENOUS

## 2015-04-06 MED ORDER — PHENYLEPHRINE 40 MCG/ML (10ML) SYRINGE FOR IV PUSH (FOR BLOOD PRESSURE SUPPORT)
PREFILLED_SYRINGE | INTRAVENOUS | Status: AC
  Filled 2015-04-06: qty 30

## 2015-04-06 MED ORDER — FUROSEMIDE 10 MG/ML IJ SOLN
INTRAMUSCULAR | Status: DC | PRN
  Administered 2015-04-06: 20 mg via INTRAMUSCULAR

## 2015-04-06 MED ORDER — POTASSIUM CHLORIDE 10 MEQ/50ML IV SOLN
10.0000 meq | INTRAVENOUS | Status: DC
  Administered 2015-04-06 (×2): 10 meq via INTRAVENOUS
  Filled 2015-04-06 (×2): qty 50

## 2015-04-06 MED ORDER — ROCURONIUM BROMIDE 50 MG/5ML IV SOLN
INTRAVENOUS | Status: AC
  Filled 2015-04-06: qty 1

## 2015-04-06 MED ORDER — THROMBIN 20000 UNITS EX SOLR
CUTANEOUS | Status: AC
  Filled 2015-04-06: qty 20000

## 2015-04-06 MED ORDER — TRACE MINERALS CR-CU-MN-SE-ZN 10-1000-500-60 MCG/ML IV SOLN
INTRAVENOUS | Status: AC
Start: 2015-04-06 — End: 2015-04-07
  Administered 2015-04-06: 18:00:00 via INTRAVENOUS
  Filled 2015-04-06: qty 2640

## 2015-04-06 MED ORDER — MIDAZOLAM HCL 2 MG/2ML IJ SOLN
INTRAMUSCULAR | Status: AC
  Filled 2015-04-06: qty 2

## 2015-04-06 MED ORDER — PROPOFOL 10 MG/ML IV BOLUS
INTRAVENOUS | Status: AC
  Filled 2015-04-06: qty 20

## 2015-04-06 MED ORDER — SODIUM CHLORIDE 0.9 % IV SOLN
INTRAVENOUS | Status: DC
  Administered 2015-04-06 – 2015-04-08 (×3): via INTRAVENOUS

## 2015-04-06 MED ORDER — FUROSEMIDE 10 MG/ML IJ SOLN
20.0000 mg | Freq: Four times a day (QID) | INTRAMUSCULAR | Status: DC
  Administered 2015-04-06 – 2015-04-07 (×3): 20 mg via INTRAVENOUS
  Filled 2015-04-06 (×3): qty 2

## 2015-04-06 MED ORDER — FENTANYL CITRATE (PF) 250 MCG/5ML IJ SOLN
INTRAMUSCULAR | Status: DC | PRN
  Administered 2015-04-06: 100 ug via INTRAVENOUS
  Administered 2015-04-06: 50 ug via INTRAVENOUS
  Administered 2015-04-06: 100 ug via INTRAVENOUS

## 2015-04-06 MED ORDER — BISACODYL 10 MG RE SUPP
10.0000 mg | Freq: Once | RECTAL | Status: AC
  Administered 2015-04-06: 10 mg via RECTAL
  Filled 2015-04-06: qty 1

## 2015-04-06 MED ORDER — MIDAZOLAM HCL 5 MG/5ML IJ SOLN
INTRAMUSCULAR | Status: DC | PRN
  Administered 2015-04-06: 2 mg via INTRAVENOUS

## 2015-04-06 SURGICAL SUPPLY — 46 items
CANISTER SUCTION 2500CC (MISCELLANEOUS) ×3 IMPLANT
CANISTER WOUND CARE 500ML ATS (WOUND CARE) ×3 IMPLANT
CLIP TI MEDIUM 24 (CLIP) ×3 IMPLANT
CLIP TI WIDE RED SMALL 24 (CLIP) ×3 IMPLANT
ELECT BLADE 4.0 EZ CLEAN MEGAD (MISCELLANEOUS) ×3
ELECT BLADE 6.5 EXT (BLADE) IMPLANT
ELECT REM PT RETURN 9FT ADLT (ELECTROSURGICAL) ×3
ELECTRODE BLDE 4.0 EZ CLN MEGD (MISCELLANEOUS) ×1 IMPLANT
ELECTRODE REM PT RTRN 9FT ADLT (ELECTROSURGICAL) ×1 IMPLANT
GLOVE BIO SURGEON STRL SZ7 (GLOVE) ×3 IMPLANT
GLOVE BIOGEL PI IND STRL 6.5 (GLOVE) ×3 IMPLANT
GLOVE BIOGEL PI IND STRL 7.0 (GLOVE) ×1 IMPLANT
GLOVE BIOGEL PI IND STRL 7.5 (GLOVE) ×1 IMPLANT
GLOVE BIOGEL PI INDICATOR 6.5 (GLOVE) ×6
GLOVE BIOGEL PI INDICATOR 7.0 (GLOVE) ×2
GLOVE BIOGEL PI INDICATOR 7.5 (GLOVE) ×2
GLOVE SS BIOGEL STRL SZ 6.5 (GLOVE) ×1 IMPLANT
GLOVE SUPERSENSE BIOGEL SZ 6.5 (GLOVE) ×2
GLOVE SURG SS PI 7.0 STRL IVOR (GLOVE) ×3 IMPLANT
GOWN STRL REUS W/ TWL LRG LVL3 (GOWN DISPOSABLE) ×3 IMPLANT
GOWN STRL REUS W/TWL LRG LVL3 (GOWN DISPOSABLE) ×6
INSERT FOGARTY 61MM (MISCELLANEOUS) ×6 IMPLANT
INSERT FOGARTY SM (MISCELLANEOUS) ×12 IMPLANT
KIT BASIN OR (CUSTOM PROCEDURE TRAY) ×3 IMPLANT
KIT ROOM TURNOVER OR (KITS) ×3 IMPLANT
NS IRRIG 1000ML POUR BTL (IV SOLUTION) ×6 IMPLANT
PACK AORTA (CUSTOM PROCEDURE TRAY) ×3 IMPLANT
PAD ARMBOARD 7.5X6 YLW CONV (MISCELLANEOUS) ×6 IMPLANT
RETAINER VISCERA MED (MISCELLANEOUS) ×3 IMPLANT
SPONGE ABDOMINAL VAC ABTHERA (MISCELLANEOUS) ×3 IMPLANT
SPONGE SURGIFOAM ABS GEL 100 (HEMOSTASIS) IMPLANT
STAPLER VISISTAT 35W (STAPLE) ×9 IMPLANT
SUT ETHIBOND 5 LR DA (SUTURE) IMPLANT
SUT PDS AB 1 TP1 96 (SUTURE) ×6 IMPLANT
SUT PROLENE 2 0 MH 48 (SUTURE) IMPLANT
SUT PROLENE 3 0 SH 48 (SUTURE) IMPLANT
SUT PROLENE 3 0 SH1 36 (SUTURE) IMPLANT
SUT PROLENE 5 0 C 1 24 (SUTURE) IMPLANT
SUT PROLENE 5 0 C 1 36 (SUTURE) IMPLANT
SUT SILK 2 0 SH CR/8 (SUTURE) ×3 IMPLANT
SUT VIC AB 2-0 CT1 36 (SUTURE) ×6 IMPLANT
SUT VIC AB 3-0 SH 27 (SUTURE)
SUT VIC AB 3-0 SH 27X BRD (SUTURE) IMPLANT
TOWEL BLUE STERILE X RAY DET (MISCELLANEOUS) ×6 IMPLANT
TRAY FOLEY W/METER SILVER 16FR (SET/KITS/TRAYS/PACK) ×3 IMPLANT
WATER STERILE IRR 1000ML POUR (IV SOLUTION) ×6 IMPLANT

## 2015-04-06 NOTE — Progress Notes (Addendum)
   Daily Progress Note  Continued interference with closure by bowel distension and stool in colon.  Unable to close fascia. Gen Surgery also evaluated intraoperatively and discussing plan for closure with biomesh on Wednesday.    - Increase diuresis - Continue efforts to encourage bowel movements  Leonides Sake, MD [facility] Office: [phone] Pager: [phone]  04/06/2015, 11:00 AM

## 2015-04-06 NOTE — Progress Notes (Signed)
Per Dr. Craige Cotta restart Heparin drip at 1730.  Corliss Skains RN

## 2015-04-06 NOTE — Progress Notes (Addendum)
ANTICOAGULATION CONSULT NOTE - Follow Up Consult  Pharmacy Consult for Heparin  Indication: DVT  Allergies  Allergen Reactions  . Propoxyphene Other (See Comments)    Unspecified    Patient Measurements: Height:  (160 cm) Weight: 265 lb 10.5 oz (120.5 kg) IBW/kg (Calculated) : 52.4 Heparin dosing wt: 84.5kg  Vital Signs: Temp: 99.4 F (37.4 C) (02/27 0815) Temp Source: Axillary (02/27 0815) BP: 107/58 mmHg (02/27 0800) Pulse Rate: 80 (02/27 0800)  Labs:  Recent Labs  04/04/15 0410 04/05/15 0410 04/06/15 0503 04/06/15 0504  HGB 8.4* 8.2* 7.9*  --   HCT 26.8* 25.6* 25.1*  --   PLT 313 405* 455*  --   HEPARINUNFRC 0.38 0.43  --  0.43  CREATININE  --  0.83 0.84  --     Estimated Creatinine Clearance: 106.3 mL/min (by C-G formula based on Cr of 0.84).   Medications: Heparin @ 2300 units/hr  Assessment: 45 y.o female with hemorraghic shock from large RP hematoma that occurred during an elective laminectomy. She was found to have a left femoral DVT and was started on IV heparin 2/19. Heparin level this morning therapeutic at 0.43. Hgb slowly trending down to 7.9 but overall stable. No bleeding. Plan for OR today to close abdomen.  Goal of Therapy:  Heparin level 0.3-0.7 units/ml Monitor platelets by anticoagulation protocol: Yes   Plan:  1) Follow up anticoagulation after OR  Louie Casa, PharmD, BCPS 04/06/2015 8:28 AM   Addendum: Patient is now s/p OR. Per Dr. Craige Cotta, ok to resume heparin tonight at 1730. Will resume at previous therapeutic rate of 2300 units/hr and follow up AM labs.  Louie Casa, PharmD, BCPS 04/06/2015, 2:12 PM

## 2015-04-06 NOTE — Progress Notes (Signed)
eLink Physician-Brief Progress Note Patient Name: Kristina Estes DOB: [birth year] MRN: [id number]   Date of Service  04/06/2015  HPI/Events of Note  RN calls as pt is on heparin drip and is for OR this am  eICU Interventions  Told nurse to stop heparin drip and inform surgeon regarding heparin.      Intervention Category Intermediate Interventions: Other:  Daneen Schick Dios 04/06/2015, 7:03 AM

## 2015-04-06 NOTE — Progress Notes (Signed)
PARENTERAL NUTRITION CONSULT NOTE - FOLLOW UP  Pharmacy Consult:  TPN Indication:  Open abdomen  Allergies  Allergen Reactions  . Propoxyphene Other (See Comments)    Unspecified    Patient Measurements: Height:  (160 cm) Weight: 265 lb 10.5 oz (120.5 kg) IBW/kg (Calculated) : 52.4  BMI = 59.7  Vital Signs: Temp: 99.4 F (37.4 C) (02/27 0815) Temp Source: Axillary (02/27 0815) BP: 107/58 mmHg (02/27 0800) Pulse Rate: 80 (02/27 0800) Intake/Output from previous day: 02/26 0701 - 02/27 0700 In: 4111.9 [I.V.:1341.9; NG/GT:190; IV Piggyback:50; TPN:2530] Out: 3725 [Urine:2925; Emesis/NG output:150; Drains:650]  Labs:  Recent Labs  04/04/15 0410 04/05/15 0410 04/06/15 0503  WBC 16.3* 15.9* 17.3*  HGB 8.4* 8.2* 7.9*  HCT 26.8* 25.6* 25.1*  PLT 313 405* 455*     Recent Labs  04/05/15 0410  04/05/15 1700 04/05/15 2350 04/06/15 0503  NA 134*  --   --   --  133*  K 3.7  < > 3.9 3.8 3.5  CL 100*  --   --   --  97*  CO2 26  --   --   --  27  GLUCOSE 186*  --   --   --  179*  BUN 17  --   --   --  18  CREATININE 0.83  --   --   --  0.84  CALCIUM 8.1*  --   --   --  8.0*  MG  --   --   --   --  2.0  PHOS 3.2  --   --   --  3.1  PROT  --   --   --   --  5.8*  ALBUMIN  --   --   --   --  1.5*  AST  --   --   --   --  67*  ALT  --   --   --   --  76*  ALKPHOS  --   --   --   --  78  BILITOT  --   --   --   --  4.8*  PREALBUMIN  --   --   --   --  9.7*  TRIG 179*  --   --   --  159*  < > = values in this interval not displayed. Estimated Creatinine Clearance: 106.3 mL/min (by C-G formula based on Cr of 0.84).    Recent Labs  04/05/15 1753 04/06/15 0007 04/06/15 0609  GLUCAP 158* 147* 169*     Insulin Requirements in the past 24 hours:  10 units SSI  Assessment: 44 YOF admitted on 03/27/15 with back and leg pain and underwent L5 laminectomy with facetectomy later that day.  Intraoperatively patient went into shock and CT revealed large left RP  hematoma.  Patient returned to the OR for ex-lap with repair of left iliac vein, ligation of distal left CIA, left CIA-CFA bypass, and placement of abdominal VAC.  Pharmacy consulted to manage TPN for nutritional support.  GI: hx GERD / obesity.  Baseline prealbumin low at 8.4 >> 9.7.  Frequent OR visits for abd washout (2/19, 2/21) - too edematous for abd closure on 2/23, return to OR Mon 2/27.  NG/emesis improved to , drain O/P 650 mL.  PPI IV, PO docusate (not given), Reglan   Endo: no hx DM - hypoglycemic prior to TPN initiation, CBGs now acceptable Lytes: lytes WNL except low Na/CL Renal: SCr stable,  CrCL > 100 ml/min - good UOP 1 ml/kg/hr, D5NS at 10 ml/hr Pulm: intubated, FiO2 30% Cards: angina - BP soft, HR controlled - Lasix IV BID AC: Heparin for new left femoral DVT (dx 03/29/15) - hgb down to 7.9, plts elevated at 455 Hepatobil: AST/ALT mildly elevated 67/76, tbili increased to 4.8 (no jaundice).  TG mildly elevated at 159 - watch Neuro: Propofol >> Precedex, Fentanyl gtt - GCS 13, CPOT 0 ID: Tmax 100.8, WBC increased to 17.3 - not on abx (now with pre-op abx) Best Practices: heparin gtt, MC TPN Access: right IJ placed 03/27/15 TPN start date: 2/21 >>  Current Nutrition:  TPN  Nutritional Goals:  1450-1890 kCal, 130-140 grams of protein per day   Plan:  - Continue Clinimix E 5/15 at goal rate of 110 ml/hr, providing 1874 kCal and 132gm protein per day and meeting 100% of patient's needs. - Hold lipid emulsion in critically ill patients for the first 7 days of TPN per SCCM/ASPEN guidelines (D#7/7)  - Daily multivitamin and trace elements, watch tbili - Change moderate SSI to Q4H.  Avoid adding insulin to TPN. - KCL x 3 runs - Consider cycling TPN after abdominal closure   Kenidi Elenbaas D. Laney Potash, PharmD, BCPS Pager:  [phone] 04/06/2015, 9:16 AM

## 2015-04-06 NOTE — Transfer of Care (Signed)
Immediate Anesthesia Transfer of Care Note  Patient: Kristina Estes  Procedure(s) Performed: Procedure(s): ABDOMINAL WASHOUT (N/A) APPLICATION OF WOUND VAC (N/A)  Patient Location: SICU  Anesthesia Type:General  Level of Consciousness: Patient remains intubated per anesthesia plan  Airway & Oxygen Therapy: Patient remains intubated per anesthesia plan and Patient placed on Ventilator (see vital sign flow sheet for setting)  Post-op Assessment: Report given to RN and Post -op Vital signs reviewed and stable  Post vital signs: Reviewed and stable  Last Vitals:  Filed Vitals:   04/06/15 0945 04/06/15 1116  BP: 100/61 138/88  Pulse: 81 116  Temp:    Resp: 21 23    Complications: No apparent anesthesia complications

## 2015-04-06 NOTE — Op Note (Addendum)
      OPERATIVE NOTE   PROCEDURE: 1. Abdominal negative pressure dressing change  PRE-OPERATIVE DIAGNOSIS: open abdomen  POST-OPERATIVE DIAGNOSIS: same as above   SURGEON: Leonides Sake, MD  ANESTHESIA: general  ESTIMATED BLOOD LOSS: 30 cc  FINDING(S): 1. Continued intestinal distension and stool in colon 2.  Intra-loop bowel adhesions forming 3. Unable to close fascia over intestines  SPECIMEN(S): none  INDICATIONS:  Kristina Estes is a 45 y.o. (1970/05/29) female  who presents with open abdomen after a massive resuscitation after a iliac vessel injury during a spine procedure. She returns today for washout of the abdomen, possible abdominal VAC placement, and possible closure of fascia.   DESCRIPTION: After obtaining full informed written consent, the patient was brought back to the operating room and placed supine upon the operating table. After obtaining adequate anesthesia, the patient was prepped and draped in the standard fashion for: exploratory laparotomy. Her previous abdominal VAC dressing had been removed except for the interior drape prior to prepping. I removed the interior drape. The bowels continued to appear markedly distended.  Additionally, the bowel loops appeared to be more adherent to each other.  There was minimal fluid within the abdominal cavity and no active bleeding.  I placed Kocher clamps on the fascia on each side and tried to reapproximate the fascia over the bowels. This was impossible to reapproximate the fascia given the bowel distension. I had Dr. Janee Morn, General Surgery, take a look and he agreed that reconstruction of the abdominal wall was going to be necessary.  He was going to coordinate with Dr. Derrell Lolling on Wednesday to schedule that additional surgery.  At this point, I obtained the abdominal VAC pack and fashioned the interior drape for the bowels. I packed this in over the bowels. I then applied two layers of sponge over the  drape. These sponges were affixed with adhesive strips. I then cut a hole in the center drape and applied the lilypad. The lilypad was connected to the [facility] circuit which began suction at 125 continuous. The VAC sponge became adherent.   COMPLICATIONS: none  CONDITION: stable  Leonides Sake, MD, [facility] [place] Office: [phone] Pager: [phone]  04/06/2015, 10:54 AM

## 2015-04-06 NOTE — Progress Notes (Addendum)
PULMONARY / CRITICAL CARE MEDICINE   Name: Kristina Estes MRN: [id number] DOB: 11-05-1970    ADMISSION DATE:  03/27/2015  CONSULTATION DATE: 03/27/15  REFERRING MD: Conchita Paris  CHIEF COMPLAINT: Hemorrhagic shock  SUBJECTIVE:  Returned from OR this AM >> Abdomen not able to be closed yet.   VITAL SIGNS: BP 138/88 mmHg  Pulse 116  Temp(Src) 98.8 F (37.1 C) (Axillary)  Resp 23  Ht  (1.6 m)  Wt 265 lb 10.5 oz (120.5 kg)  BMI 47.07 kg/m2  SpO2 100%  LMP 03/27/2015  VENTILATOR SETTINGS: Vent Mode:  [-] PRVC FiO2 (%):  [30 %-40 %] 40 % Set Rate:  [20 bmp] 20 bmp Vt Set:  [450 mL] 450 mL PEEP:  [5 cmH20-8 cmH20] 5 cmH20 Plateau Pressure:  [22 cmH20-26 cmH20] 22 cmH20  INTAKE / OUTPUT: I/O last 3 completed shifts: In: 6492.3 [I.V.:2122.3; NG/GT:220; IV Piggyback:300] Out: 5950 [Urine:4500; Emesis/NG output:450; Drains:1000]   PHYSICAL EXAMINATION: General: RASS 0 Neuro: follows commands HEENT: ETT in place Cardiac: regular, no murmur Chest: no wheeze Abd: wound vac in place Ext: 2+ edema Skin: no rashes   LABS:  BMET  Recent Labs Lab 04/03/15 0500  04/05/15 0410  04/05/15 2350 04/06/15 0503 04/06/15 1230  NA 134*  --  134*  --   --  133*  --   K 4.0  < > 3.7  < > 3.8 3.5 3.5  CL 105  --  100*  --   --  97*  --   CO2 24  --  26  --   --  27  --   BUN 17  --  17  --   --  18  --   CREATININE 0.78  --  0.83  --   --  0.84  --   GLUCOSE 185*  --  186*  --   --  179*  --   < > = values in this interval not displayed.  Electrolytes  Recent Labs Lab 04/01/15 0417 04/02/15 0456 04/03/15 0500 04/05/15 0410 04/06/15 0503  CALCIUM 8.3* 8.5* 8.0* 8.1* 8.0*  MG 1.8 1.9  --   --  2.0  PHOS 3.0 2.8  --  3.2 3.1    CBC  Recent Labs Lab 04/04/15 0410 04/05/15 0410 04/06/15 0503  WBC 16.3* 15.9* 17.3*  HGB 8.4* 8.2* 7.9*  HCT 26.8* 25.6* 25.1*  PLT 313 405* 455*    Coag's  Recent Labs Lab 04/02/15 0730  INR 1.20    Sepsis  Markers  Recent Labs Lab 04/02/15 1100  LATICACIDVEN 0.8    Liver Enzymes  Recent Labs Lab 04/01/15 0417 04/02/15 0456 04/06/15 0503  AST 16 46* 67*  ALT 13* 29 76*  ALKPHOS 43 44 78  BILITOT 0.8 1.0 4.8*  ALBUMIN 2.1* 2.1* 1.5*   Glucose  Recent Labs Lab 04/05/15 0616 04/05/15 1220 04/05/15 1753 04/06/15 0007 04/06/15 0609 04/06/15 1235  GLUCAP 172* 138* 158* 147* 169* 174*    Imaging No results found.    STUDIES:  2/17 CT abd/pelvis >>Lt sided retroperitoneal hematoma 2/19 Doppler b/l legs >> DVT Lt common femoral vein  CULTURES: 2/23 Blood >> 2/24 Sputum >> Few candida  ANTIBIOTICS: 2/23 Vancomycin >> 2/26 2/23 Zosyn >>   SIGNIFICANT EVENTS: 2/17 L5 laminectomy >> retroperitoneal hemorrhage; vascular surgery consulted >> Lt common iliac to Lt common femoral bypass 2/19 abdominal washout, wound vac; DVT Lt leg >> start heparin gtt 2/20 General surgery consulted 2/21 To OR 2/23  To OR 2/27 To OR  LINES/TUBES: 2/17 IJ CVL >> 2/25 2/17 ETT >> 2/25 Rt PICC >>   DISCUSSION: 45 yo female with back and leg pain from L5-S1 spondylolisthesis.  She had L5 laminectomy on 2/17 >> c/b retroperitoneal hemorrhage.  She required laparotomy with Lt common iliac to common femoral bypass.  Her abdomen has remained open due to edema, and she has required repeated trips to the OR.  She remains on the vent for OR trips.  She has hx of Asthma, GERD.  ASSESSMENT / PLAN:  PULMONARY A: Acute respiratory failure in setting of retroperitoneal hemorrhage. P:  Full vent support >> might need tracheostomy if unable to wean from vent soon F/u CXR  CARDIOVASCULAR A: Hemorrhagic shock 2nd to retroperitoneal bleeding >> resolved. Lt leg DVT noted 2/19. P:  Continue heparin gtt Monitor hemodynamics  RENAL A:  Hypervolemia. Hypokalemia. P:  Continue lasix with goal of negative fluid balance Replace electrolytes as needed  GASTROINTESTINAL A:  S/p  laparotomy with difficulty closing abdominal wound due to edema. Nutrition. P: Wound vac, post op care per surgery Plan for her to return to OR 3/01 TNA, reglan per surgery Protonix for SUP  HEMATOLOGIC A:  Anemia 2nd to acute blood loss, and critical illness. P:  F/u CBC Transfuse for Hb < 7  INFECTIOUS A:  HCAP. P:  Day 5/7 of zosyn  ENDOCRINE A:  Hyperglycemia. P:  SSI  NEUROLOGIC A:  L5-S1 spondylolisthesis. Sedation. P:  RASS goal -1  CC time 38 minutes.  Coralyn Helling, MD [facility] Pulmonary/Critical Care 04/06/2015, 2:11 PM Pager:  [phone] After 3pm call: [phone]

## 2015-04-06 NOTE — Anesthesia Preprocedure Evaluation (Signed)
Anesthesia Evaluation  Patient identified by MRN, date of birth, ID band Patient unresponsive    Reviewed: Allergy & Precautions, NPO status , Patient's Chart, lab work & pertinent test results, Unable to perform ROS - Chart review only  Airway Mallampati: Intubated  TM Distance: >3 FB Neck ROM: Full    Dental no notable dental hx.    Pulmonary neg pulmonary ROS,    breath sounds clear to auscultation+ rhonchi        Cardiovascular Normal cardiovascular exam+ dysrhythmias  Rhythm:Regular Rate:Normal     Neuro/Psych negative neurological ROS  negative psych ROS   GI/Hepatic negative GI ROS, Neg liver ROS,   Endo/Other  Morbid obesity  Renal/GU negative Renal ROS  negative genitourinary   Musculoskeletal negative musculoskeletal ROS (+)   Abdominal   Peds negative pediatric ROS (+)  Hematology negative hematology ROS (+)   Anesthesia Other Findings   Reproductive/Obstetrics negative OB ROS                             Lab Results  Component Value Date   WBC 17.3* 04/06/2015   HGB 7.9* 04/06/2015   HCT 25.1* 04/06/2015   MCV 89.3 04/06/2015   PLT 455* 04/06/2015   Lab Results  Component Value Date   CREATININE 0.84 04/06/2015   BUN 18 04/06/2015   NA 133* 04/06/2015   K 3.5 04/06/2015   CL 97* 04/06/2015   CO2 27 04/06/2015    Anesthesia Physical  Anesthesia Plan  ASA: III  Anesthesia Plan: General   Post-op Pain Management:    Induction: Inhalational  Airway Management Planned: Oral ETT  Additional Equipment:   Intra-op Plan:   Post-operative Plan: Post-operative intubation/ventilation  Informed Consent: I have reviewed the patients History and Physical, chart, labs and discussed the procedure including the risks, benefits and alternatives for the proposed anesthesia with the patient or authorized representative who has indicated his/her understanding and  acceptance.   Dental advisory given  Plan Discussed with: CRNA and Surgeon  Anesthesia Plan Comments:         Anesthesia Quick Evaluation

## 2015-04-06 NOTE — Anesthesia Postprocedure Evaluation (Signed)
Anesthesia Post Note  Patient: Kristina Estes  Procedure(s) Performed: Procedure(s) (LRB): ABDOMINAL WASHOUT (N/A) APPLICATION OF WOUND VAC (N/A)  Patient location during evaluation: ICU Anesthesia Type: General Level of consciousness: patient remains intubated per anesthesia plan Pain management: pain level controlled Vital Signs Assessment: post-procedure vital signs reviewed and stable Respiratory status: patient remains intubated per anesthesia plan Cardiovascular status: blood pressure returned to baseline Anesthetic complications: no    Last Vitals:  Filed Vitals:   04/06/15 0945 04/06/15 1116  BP: 100/61 138/88  Pulse: 81 116  Temp:    Resp: 21 23    Last Pain:  Filed Vitals:   04/06/15 1118  PainSc: 0-No pain                 Kennieth Rad

## 2015-04-06 NOTE — Progress Notes (Signed)
Per assessment no need to continue restraints per RN, restraints discontinued and documented. Patient is following commands, discussed with patient the need to place safety mitts and she agreed.  Corliss Skains RN

## 2015-04-06 NOTE — Progress Notes (Signed)
Pt is awake, alert. Mouthing questions, nods appropriate responses. Moving BLE well, appears symmetric. Unable to close abd again today, plan on definitive closure possibly with abd wall reconstruction on Wed. I spoke with the patient's mother, all questions were answered.

## 2015-04-06 NOTE — Progress Notes (Signed)
Potassium 3.5 on recheck, patient receiving IV potassium per orders. There was a delay on administration since patient was in the OR in the AM.  Corliss Skains RN

## 2015-04-07 ENCOUNTER — Inpatient Hospital Stay (HOSPITAL_COMMUNITY): Payer: PRIVATE HEALTH INSURANCE

## 2015-04-07 ENCOUNTER — Encounter (HOSPITAL_COMMUNITY): Payer: Self-pay | Admitting: Vascular Surgery

## 2015-04-07 LAB — GLUCOSE, CAPILLARY
GLUCOSE-CAPILLARY: 135 mg/dL — AB (ref 65–99)
GLUCOSE-CAPILLARY: 156 mg/dL — AB (ref 65–99)
GLUCOSE-CAPILLARY: 162 mg/dL — AB (ref 65–99)
GLUCOSE-CAPILLARY: 170 mg/dL — AB (ref 65–99)
GLUCOSE-CAPILLARY: 176 mg/dL — AB (ref 65–99)
Glucose-Capillary: 146 mg/dL — ABNORMAL HIGH (ref 65–99)

## 2015-04-07 LAB — CULTURE, BLOOD (SINGLE): Culture: NO GROWTH

## 2015-04-07 LAB — BASIC METABOLIC PANEL
Anion gap: 8 (ref 5–15)
BUN: 19 mg/dL (ref 6–20)
CALCIUM: 8.3 mg/dL — AB (ref 8.9–10.3)
CHLORIDE: 95 mmol/L — AB (ref 101–111)
CO2: 31 mmol/L (ref 22–32)
CREATININE: 0.83 mg/dL (ref 0.44–1.00)
Glucose, Bld: 163 mg/dL — ABNORMAL HIGH (ref 65–99)
Potassium: 3.8 mmol/L (ref 3.5–5.1)
SODIUM: 134 mmol/L — AB (ref 135–145)

## 2015-04-07 LAB — CBC
HEMATOCRIT: 25.3 % — AB (ref 36.0–46.0)
Hemoglobin: 8.1 g/dL — ABNORMAL LOW (ref 12.0–15.0)
MCH: 28.7 pg (ref 26.0–34.0)
MCHC: 32 g/dL (ref 30.0–36.0)
MCV: 89.7 fL (ref 78.0–100.0)
PLATELETS: 477 10*3/uL — AB (ref 150–400)
RBC: 2.82 MIL/uL — ABNORMAL LOW (ref 3.87–5.11)
RDW: 14.9 % (ref 11.5–15.5)
WBC: 22.2 10*3/uL — AB (ref 4.0–10.5)

## 2015-04-07 LAB — POTASSIUM
POTASSIUM: 3.8 mmol/L (ref 3.5–5.1)
Potassium: 3.9 mmol/L (ref 3.5–5.1)
Potassium: 3.8 mmol/L (ref 3.5–5.1)

## 2015-04-07 LAB — HEPARIN LEVEL (UNFRACTIONATED): HEPARIN UNFRACTIONATED: 0.51 [IU]/mL (ref 0.30–0.70)

## 2015-04-07 LAB — PHOSPHORUS: PHOSPHORUS: 3.2 mg/dL (ref 2.5–4.6)

## 2015-04-07 MED ORDER — FAT EMULSION 20 % IV EMUL
240.0000 mL | INTRAVENOUS | Status: AC
  Administered 2015-04-07: 240 mL via INTRAVENOUS
  Filled 2015-04-07: qty 250

## 2015-04-07 MED ORDER — TRACE MINERALS CR-CU-MN-SE-ZN 10-1000-500-60 MCG/ML IV SOLN
INTRAVENOUS | Status: AC
  Administered 2015-04-07: 18:00:00 via INTRAVENOUS
  Filled 2015-04-07: qty 2640

## 2015-04-07 MED ORDER — FUROSEMIDE 10 MG/ML IJ SOLN
40.0000 mg | Freq: Two times a day (BID) | INTRAMUSCULAR | Status: AC
  Administered 2015-04-07 – 2015-04-08 (×2): 40 mg via INTRAVENOUS
  Filled 2015-04-07 (×2): qty 4

## 2015-04-07 MED ORDER — DOCUSATE SODIUM 50 MG/5ML PO LIQD
100.0000 mg | Freq: Every day | ORAL | Status: DC
  Administered 2015-04-07 – 2015-04-12 (×5): 100 mg via ORAL
  Filled 2015-04-07 (×12): qty 10

## 2015-04-07 NOTE — Progress Notes (Signed)
1 Day Post-Op  Subjective: Pt with no acute changes in OR. Abd con't to be open after yesterdays washout.  Objective: Vital signs in last 24 hours: Temp:  [98.8 F (37.1 C)-99.6 F (37.6 C)] 99.1 F (37.3 C) (02/28 0440) Pulse Rate:  [80-118] 83 (02/28 0700) Resp:  [15-33] 20 (02/28 0700) BP: (89-140)/(51-88) 89/51 mmHg (02/28 0700) SpO2:  [100 %] 100 % (02/28 0700) FiO2 (%):  [30 %-40 %] 40 % (02/28 0500) Weight:  [126.1 kg (278 lb)] 126.1 kg (278 lb) (02/28 0400) Last BM Date: 03/27/15  Intake/Output from previous day: 02/27 0701 - 02/28 0700 In: 4554.7 [I.V.:1646.5; NG/GT:70; IV Piggyback:200; TPN:2638.2] Out: 6205 [Urine:5305; Emesis/NG output:650; Drains:200; Blood:50] Intake/Output this shift:    General appearance: alert GI: vac in place  Lab Results:   Recent Labs  04/06/15 0503 04/07/15 0433  WBC 17.3* 22.2*  HGB 7.9* 8.1*  HCT 25.1* 25.3*  PLT 455* 477*   BMET  Recent Labs  04/05/15 0410  04/06/15 0503  04/06/15 2315 04/07/15 0433  NA 134*  --  133*  --   --   --   K 3.7  < > 3.5  < > 3.9 3.8  CL 100*  --  97*  --   --   --   CO2 26  --  27  --   --   --   GLUCOSE 186*  --  179*  --   --   --   BUN 17  --  18  --   --   --   CREATININE 0.83  --  0.84  --   --   --   CALCIUM 8.1*  --  8.0*  --   --   --   < > = values in this interval not displayed. PT/INR No results for input(s): LABPROT, INR in the last 72 hours. ABG No results for input(s): PHART, HCO3 in the last 72 hours.  Invalid input(s): PCO2, PO2  Studies/Results: No results found.  Anti-infectives: Anti-infectives    Start     Dose/Rate Route Frequency Ordered Stop   04/02/15 2330  vancomycin (VANCOCIN) IVPB 1000 mg/200 mL premix  Status:  Discontinued     1,000 mg 200 mL/hr over 60 Minutes Intravenous Every 12 hours 04/02/15 1015 04/05/15 0849   04/02/15 1830  piperacillin-tazobactam (ZOSYN) IVPB 3.375 g     3.375 g 12.5 mL/hr over 240 Minutes Intravenous 3 times per day  04/02/15 1015     04/02/15 1100  cefUROXime (ZINACEF) 1.5 g in dextrose 5 % 50 mL IVPB  Status:  Discontinued     1.5 g 100 mL/hr over 30 Minutes Intravenous To ShortStay Surgical 04/01/15 1106 04/02/15 1448   04/02/15 1100  vancomycin (VANCOCIN) 2,500 mg in sodium chloride 0.9 % 500 mL IVPB     2,500 mg 250 mL/hr over 120 Minutes Intravenous  Once 04/02/15 1029 04/02/15 1342   04/02/15 1030  vancomycin (VANCOCIN) 2,250 mg in sodium chloride 0.9 % 500 mL IVPB  Status:  Discontinued     2,250 mg 250 mL/hr over 120 Minutes Intravenous  Once 04/02/15 1015 04/02/15 1029   04/02/15 1030  piperacillin-tazobactam (ZOSYN) IVPB 3.375 g     3.375 g 100 mL/hr over 30 Minutes Intravenous  Once 04/02/15 1015 04/02/15 1212   03/27/15 2130  cefUROXime (ZINACEF) 1.5 g in dextrose 5 % 50 mL IVPB  Status:  Discontinued     1.5 g 100 mL/hr over 30 Minutes  Intravenous Every 12 hours 03/27/15 2124 03/27/15 2132   03/27/15 1600  ceFAZolin (ANCEF) IVPB 2 g/50 mL premix     2 g 100 mL/hr over 30 Minutes Intravenous Every 8 hours 03/27/15 1322 03/28/15 0759   03/27/15 1447  ceFAZolin (ANCEF) 1-5 GM-% IVPB    Comments:  Merril Abbe   : cabinet override      03/27/15 1447 03/28/15 0259   03/27/15 0800  bacitracin 50,000 Units in sodium chloride irrigation 0.9 % 500 mL irrigation  Status:  Discontinued       As needed 03/27/15 0943 03/27/15 1223   03/27/15 0700  ceFAZolin (ANCEF) 3 g in dextrose 5 % 50 mL IVPB     3 g 130 mL/hr over 30 Minutes Intravenous To ShortStay Surgical 03/26/15 1409 03/27/15 0750      Assessment/Plan: s/p Procedure(s): ABDOMINAL WASHOUT (N/A) APPLICATION OF WOUND VAC (N/A) Would plan for OR tomorrow potentially to attempt abd wall coverage with biologic mesh and possible skin closure. Will discuss with/coordinate with Dr. Imogene Burn   LOS: 11 days    Marigene Ehlers., [facility] 04/07/2015

## 2015-04-07 NOTE — Progress Notes (Signed)
   Daily Progress Note  Assessment/Planning: S/p x-lap, Repair L CIV, Ligation distal L CIA, L CIA to CFA bypass, s/p multiple abd vac changes x 4   Discuss plan with Dr. Derrell Lolling.  He plans on taking her back to OR tomorrow  Some progress overnight with -2 L diuresis and +BM  Pt on empiric abx: unclear source of leukocytosis  Subjective  - 1 Day Post-Op  No events overnight  Objective Filed Vitals:   04/07/15 0735 04/07/15 0758 04/07/15 0800 04/07/15 0858  BP: 99/63  95/51 98/58  Pulse: 86  88 90  Temp:  98.4 F (36.9 C)    TempSrc:  Axillary    Resp:   24 27  Height:      Weight:      SpO2:   100% 100%    Intake/Output Summary (Last 24 hours) at 04/07/15 0925 Last data filed at 04/07/15 0800  Gross per 24 hour  Intake 4584.8 ml  Output   6460 ml  Net -1875.2 ml    PULM  BLL rales CV  RRR GI  soft, open ABD, VAC adherent VASC  Warm feet with palpable pulses  Laboratory CBC    Component Value Date/Time   WBC 22.2* 04/07/2015 0433   HGB 8.1* 04/07/2015 0433   HCT 25.3* 04/07/2015 0433   PLT 477* 04/07/2015 0433    BMET    Component Value Date/Time   NA 133* 04/06/2015 0503   K 3.8 04/07/2015 0433   CL 97* 04/06/2015 0503   CO2 27 04/06/2015 0503   GLUCOSE 179* 04/06/2015 0503   BUN 18 04/06/2015 0503   CREATININE 0.84 04/06/2015 0503   CALCIUM 8.0* 04/06/2015 0503   GFRNONAA >60 04/06/2015 0503   GFRAA >60 04/06/2015 0503    Leonides Sake, MD [facility] [place] Office: [phone] Pager: [phone]  04/07/2015, 9:25 AM

## 2015-04-07 NOTE — Progress Notes (Signed)
Utilization Review Completed.  

## 2015-04-07 NOTE — Progress Notes (Signed)
PULMONARY / CRITICAL CARE MEDICINE   Name: Kristina Estes MRN: [id number] DOB: 12/05/1970    ADMISSION DATE:  03/27/2015  CONSULTATION DATE: 03/27/15  REFERRING MD: Conchita Paris  CHIEF COMPLAINT: Hemorrhagic shock  SUBJECTIVE:  Tolerating some pressure support.   VITAL SIGNS: BP 98/58 mmHg  Pulse 90  Temp(Src) 98.4 F (36.9 C) (Axillary)  Resp 27  Ht  (1.6 m)  Wt 278 lb (126.1 kg)  BMI 49.26 kg/m2  SpO2 100%  LMP 03/27/2015  VENTILATOR SETTINGS: Vent Mode:  [-] PSV FiO2 (%):  [40 %] 40 % Set Rate:  [20 bmp] 20 bmp Vt Set:  [450 mL] 450 mL PEEP:  [5 cmH20] 5 cmH20 Pressure Support:  [15 cmH20] 15 cmH20 Plateau Pressure:  [12 cmH20-27 cmH20] 12 cmH20  INTAKE / OUTPUT: I/O last 3 completed shifts: In: 6700.3 [I.V.:2322.1; NG/GT:170; IV Piggyback:250] Out: 8280 [Urine:6830; Emesis/NG output:800; Drains:600; Blood:50]   PHYSICAL EXAMINATION: General: RASS 0 Neuro: follows commands HEENT: ETT in place Cardiac: regular, no murmur Chest: no wheeze Abd: wound vac in place Ext: 1+ edema Skin: no rashes   LABS:  BMET  Recent Labs Lab 04/03/15 0500  04/05/15 0410  04/06/15 0503  04/06/15 1739 04/06/15 2315 04/07/15 0433  NA 134*  --  134*  --  133*  --   --   --   --   K 4.0  < > 3.7  < > 3.5  < > 4.2 3.9 3.8  CL 105  --  100*  --  97*  --   --   --   --   CO2 24  --  26  --  27  --   --   --   --   BUN 17  --  17  --  18  --   --   --   --   CREATININE 0.78  --  0.83  --  0.84  --   --   --   --   GLUCOSE 185*  --  186*  --  179*  --   --   --   --   < > = values in this interval not displayed.  Electrolytes  Recent Labs Lab 04/01/15 0417 04/02/15 0456 04/03/15 0500 04/05/15 0410 04/06/15 0503  CALCIUM 8.3* 8.5* 8.0* 8.1* 8.0*  MG 1.8 1.9  --   --  2.0  PHOS 3.0 2.8  --  3.2 3.1    CBC  Recent Labs Lab 04/05/15 0410 04/06/15 0503 04/07/15 0433  WBC 15.9* 17.3* 22.2*  HGB 8.2* 7.9* 8.1*  HCT 25.6* 25.1* 25.3*  PLT 405* 455* 477*     Coag's  Recent Labs Lab 04/02/15 0730  INR 1.20    Sepsis Markers  Recent Labs Lab 04/02/15 1100  LATICACIDVEN 0.8    Liver Enzymes  Recent Labs Lab 04/01/15 0417 04/02/15 0456 04/06/15 0503  AST 16 46* 67*  ALT 13* 29 76*  ALKPHOS 43 44 78  BILITOT 0.8 1.0 4.8*  ALBUMIN 2.1* 2.1* 1.5*   Glucose  Recent Labs Lab 04/06/15 1235 04/06/15 1635 04/06/15 1950 04/07/15 0035 04/07/15 0407 04/07/15 0756  GLUCAP 174* 205* 185* 176* 146* 170*    Imaging Dg Chest Port 1 View  04/07/2015  CLINICAL DATA:  Respiratory failure. EXAM: PORTABLE CHEST 1 VIEW COMPARISON:  04/04/2015. FINDINGS: Interim placement of PICC line, its tip is at the cavoatrial junction Endotracheal tube, NG tube scratched in stable position. Mediastinum and hilar structures normal. Heart  size stable. Low lung volumes with mild bibasilar atelectasis. No pleural effusion or pneumothorax . IMPRESSION: 1. Interim placement of PICC line, its tip is at the cavoatrial junction . Remaining lines and tubes in stable position. 2. Low lung volumes with mild bibasilar atelectasis. Electronically Signed   By: Maisie Fus  Register   On: 04/07/2015 07:45      STUDIES:  2/17 CT abd/pelvis >>Lt sided retroperitoneal hematoma 2/19 Doppler b/l legs >> DVT Lt common femoral vein  CULTURES: 2/23 Blood >> 2/24 Sputum >> Few candida  ANTIBIOTICS: 2/23 Vancomycin >> 2/26 2/23 Zosyn >>   SIGNIFICANT EVENTS: 2/17 L5 laminectomy >> retroperitoneal hemorrhage; vascular surgery consulted >> Lt common iliac to Lt common femoral bypass 2/19 abdominal washout, wound vac; DVT Lt leg >> start heparin gtt 2/20 General surgery consulted 2/21 To OR 2/23 To OR 2/27 To OR  LINES/TUBES: 2/17 IJ CVL >> 2/25 2/17 ETT >> 2/25 Rt PICC >>   DISCUSSION: 45 yo female with back and leg pain from L5-S1 spondylolisthesis.  She had L5 laminectomy on 2/17 >> c/b retroperitoneal hemorrhage.  She required laparotomy with Lt common iliac  to common femoral bypass.  Her abdomen has remained open due to edema, and she has required repeated trips to the OR.  She remains on the vent for OR trips.  She has hx of Asthma, GERD.  ASSESSMENT / PLAN:  PULMONARY A: Acute respiratory failure in setting of retroperitoneal hemorrhage. P:  Pressure support wean as tolerated >> defer extubation until surgical issues improved Might need trach to assist with vent weaning F/u CXR  CARDIOVASCULAR A: Hemorrhagic shock 2nd to retroperitoneal bleeding >> resolved. Lt leg DVT noted 2/19. P:  Continue heparin gtt Monitor hemodynamics  RENAL A:  Hypervolemia. Hypokalemia. P:  Change lasix to 40 mg q12h on 2/28 Replace electrolytes as needed  GASTROINTESTINAL A:  S/p laparotomy with difficulty closing abdominal wound due to edema. Nutrition. P: Wound vac, post op care per surgery Plan for her to return to OR 3/01 TNA, reglan per surgery Protonix for SUP  HEMATOLOGIC A:  Anemia 2nd to acute blood loss, and critical illness. P:  F/u CBC Transfuse for Hb < 7  INFECTIOUS A:  HCAP. P:  Day 6/7 of zosyn  ENDOCRINE A:  Hyperglycemia. P:  SSI  NEUROLOGIC A:  L5-S1 spondylolisthesis. Sedation. P:  RASS goal 0 to -1  Updated pt's husband at bedside.  CC time 32 minutes.  Coralyn Helling, MD [facility] Pulmonary/Critical Care 04/07/2015, 9:30 AM Pager:  [phone] After 3pm call: [phone]

## 2015-04-07 NOTE — Progress Notes (Signed)
ANTICOAGULATION CONSULT NOTE - Follow Up Consult  Pharmacy Consult for Heparin  Indication: DVT  Allergies  Allergen Reactions  . Propoxyphene Other (See Comments)    Unspecified    Patient Measurements: Height:  (160 cm) Weight: 278 lb (126.1 kg) IBW/kg (Calculated) : 52.4 Heparin dosing wt: 84.5kg  Vital Signs: Temp: 98.4 F (36.9 C) (02/28 0758) Temp Source: Axillary (02/28 0758) BP: 95/51 mmHg (02/28 0800) Pulse Rate: 88 (02/28 0800)  Labs:  Recent Labs  04/05/15 0410 04/06/15 0503 04/06/15 0504 04/07/15 0433  HGB 8.2* 7.9*  --  8.1*  HCT 25.6* 25.1*  --  25.3*  PLT 405* 455*  --  477*  HEPARINUNFRC 0.43  --  0.43 0.51  CREATININE 0.83 0.84  --   --     Estimated Creatinine Clearance: 109.3 mL/min (by C-G formula based on Cr of 0.84).   Medications: Heparin @ 2300 units/hr  Assessment: 45 y.o female with hemorraghic shock from large RP hematoma that occurred during an elective laminectomy. She was found to have a left femoral DVT and was started on IV heparin 2/19. She went to the OR yesterday for abdominal washout and heparin resumed after. Planning for OR tomorrow to attempt closure. Heparin level remains therapeutic at 0.51. Hgb low but stable. No bleeding.  Goal of Therapy:  Heparin level 0.3-0.7 units/ml Monitor platelets by anticoagulation protocol: Yes   Plan:  1) Continue heparin at 2300 units/hr 2) Daily heparin level, CBC 3) Plan OR tomorrow to attempt abdomen closure  Louie Casa, PharmD, BCPS 04/07/2015 9:04 AM

## 2015-04-07 NOTE — Progress Notes (Signed)
Pt remains stable, no issues overnight. She's awake, alert. MAE well, symmetric. Abd remains open, vac in place. Plan on return to OR tomorrow for closure.

## 2015-04-07 NOTE — Progress Notes (Signed)
Nutrition Follow Up  DOCUMENTATION CODES:   Morbid obesity  INTERVENTION:    TPN per pharmacy  NUTRITION DIAGNOSIS:   Inadequate oral intake related to inability to eat as evidenced by NPO status, ongoing  GOAL:   Provide needs based on ASPEN/SCCM guidelines, met  MONITOR:   Vent status, Weight trends, Labs, Skin, I & O's, TPN prescription   ASSESSMENT:   45-year-old with history of asthma, arthritis, obesity. She had an elective admission today for L5 laminectomy. Intra-Op she went into shock, hemodynamic instability. Subsequent CT showed large left retroperitoneal hematoma. She was put on 2 pressors (norepi, epinephrine) and transferred to ICU. PCCM consulted for help with management. Vascular surgery was also consulted. Intra OP TEE showed normal cardiac function.  Patient s/p procedure 2/19: ABDOMINAL WASHOUT NEGATIVE PRESSURE ABDOMINAL DRESSING  Patient s/p procedure 2/23 & 2/27: ABDOMINAL NEGATIVE PRESSURE DRESSING CHANGE  Patient is currently intubated on ventilator support -- NGT to LIS Temp (24hrs), Avg:99.1 F (37.3 C), Min:98.4 F (36.9 C), Max:99.6 F (37.6 C)   Propofol discontinued 2/23.  Patient is receiving TPN with Clinimix E 5/15 @ 110 ml/hr and 20% lipids @ 10 ml/hr on Tuesdays and Thursdays only.  TPN provides a weekly average of 2011 kcals, 132 gm protein per day.  Meets 106% minimum estimated energy needs and 100% minimum estimated protein needs.  Plan is for patient to potentially go to OR tomorrow.  CCM note reviewed.  May need tracheostomy.  Diet Order:  Diet NPO time specified .TPN (CLINIMIX-E) Adult TPN (CLINIMIX-E) Adult  Skin:  Wound (see comment) (abdominal wound VAC)  Last BM:  2/27  Height:   Ht Readings from Last 1 Encounters:  03/27/15 5' 3" (1.6 m)    Weight:   Wt Readings from Last 1 Encounters:  04/07/15 278 lb (126.1 kg)    Ideal Body Weight:  52.27 kg (kg)  BMI:  Body mass index is 49.26 kg/(m^2).  Estimated  Nutritional Needs:   Kcal:  1478-1882  Protein:  130-140 gm  Fluid:  per MD  EDUCATION NEEDS:   No education needs identified at this time  Katie Lamberton, RD, LDN Pager #: [phone] After-Hours Pager #: [phone]  

## 2015-04-07 NOTE — Progress Notes (Signed)
PARENTERAL NUTRITION CONSULT NOTE - FOLLOW UP  Pharmacy Consult:  TPN Indication:  Open abdomen  Allergies  Allergen Reactions  . Propoxyphene Other (See Comments)    Unspecified    Patient Measurements: Height:  (160 cm) Weight: 278 lb (126.1 kg) IBW/kg (Calculated) : 52.4  BMI = 59.7  Vital Signs: Temp: 98.4 F (36.9 C) (02/28 0758) Temp Source: Axillary (02/28 0758) BP: 98/58 mmHg (02/28 0858) Pulse Rate: 90 (02/28 0858) Intake/Output from previous day: 02/27 0701 - 02/28 0700 In: 4554.7 [I.V.:1646.5; NG/GT:70; IV Piggyback:200; WUJ:8119.1] Out: 6205 [Urine:5305; Emesis/NG output:650; Drains:200; Blood:50]  Labs:  Recent Labs  04/05/15 0410 04/06/15 0503 04/07/15 0433  WBC 15.9* 17.3* 22.2*  HGB 8.2* 7.9* 8.1*  HCT 25.6* 25.1* 25.3*  PLT 405* 455* 477*     Recent Labs  04/05/15 0410  04/06/15 0503  04/06/15 1739 04/06/15 2315 04/07/15 0433  NA 134*  --  133*  --   --   --   --   K 3.7  < > 3.5  < > 4.2 3.9 3.8  CL 100*  --  97*  --   --   --   --   CO2 26  --  27  --   --   --   --   GLUCOSE 186*  --  179*  --   --   --   --   BUN 17  --  18  --   --   --   --   CREATININE 0.83  --  0.84  --   --   --   --   CALCIUM 8.1*  --  8.0*  --   --   --   --   MG  --   --  2.0  --   --   --   --   PHOS 3.2  --  3.1  --   --   --   --   PROT  --   --  5.8*  --   --   --   --   ALBUMIN  --   --  1.5*  --   --   --   --   AST  --   --  67*  --   --   --   --   ALT  --   --  76*  --   --   --   --   ALKPHOS  --   --  78  --   --   --   --   BILITOT  --   --  4.8*  --   --   --   --   PREALBUMIN  --   --  9.7*  --   --   --   --   TRIG 179*  --  159*  --   --   --   --   < > = values in this interval not displayed. Estimated Creatinine Clearance: 109.3 mL/min (by C-G formula based on Cr of 0.84).    Recent Labs  04/07/15 0035 04/07/15 0407 04/07/15 0756  GLUCAP 176* 146* 170*     Insulin Requirements in the past 24 hours:  16 units  SSI  Assessment: 44 YOF admitted on 03/27/15 with back and leg pain and underwent L5 laminectomy with facetectomy later that day.  Intraoperatively patient went into shock and CT revealed large left RP hematoma.  Patient returned to the  OR for ex-lap with repair of left iliac vein, ligation of distal left CIA, left CIA-CFA bypass, and placement of abdominal VAC.  Pharmacy consulted to manage TPN for nutritional support.  GI: hx GERD / obesity.  Baseline prealbumin low at 8.4 >> 9.7.  Frequent OR visits for abd washout (2/19, 2/21, 2/27) - too edematous for abd closure on 2/23, unable to close fascia over intestines on 2/27, plan for biomesh on 3/1.  NG/emesis O/P , drain O/P 200 mL.  PPI IV, PO docusate, Reglan   Endo: no hx DM - hypoglycemic prior to TPN initiation, CBGs now high normal Lytes: no labs today except K+ 3.8, lytes stable yesterday Renal: SCr stable, CrCL > 100 ml/min - good UOP 1.8 ml/kg/hr, D5NS at 10 ml/hr Pulm: intubated, FiO2 30% Cards: angina - BP soft, HR controlled - Lasix IV (dose increased) AC: Heparin for new left femoral DVT (dx 03/29/15) - hgb 8.1, plts elevated at 477 Hepatobil: AST/ALT mildly elevated 67/76, tbili increased to 4.8 (no jaundice).  TG mildly elevated at 159 - watch Neuro: Propofol >> Precedex, Fentanyl gtt - GCS 13, CPOT 0 ID: afebrile, WBC increased to 22.2 - not on abx (now with pre-op abx) Best Practices: heparin gtt, MC TPN Access: right IJ placed 03/27/15 TPN start date: 2/21 >>  Current Nutrition:  TPN  Nutritional Goals:  1450-1890 kCal, 130-140 grams of protein per day   Plan:  - Continue Clinimix E 5/15 at goal rate of 110 ml/hr and start 20% lipid at 10 ml/hr on Tues and Thurs only to minimize kCal provision.  TPN will provide a weekly average of 2011 kCal and 32gm protein per day, exceeding kCal goal by 6% and meeting 100% of protein need. - Daily multivitamin and trace elements, watch tbili and monitor for jaundice - Continue  moderate SSI Q4H and add 10 units regular insulin to TPN given plan to cycle TPN and expect glucose to be elevated during peak infusion. - Consider cycling TPN after abdominal closure   Martinez Boxx D. Laney Potash, PharmD, BCPS Pager:  [phone] 04/07/2015, 9:36 AM

## 2015-04-08 ENCOUNTER — Inpatient Hospital Stay (HOSPITAL_COMMUNITY): Payer: PRIVATE HEALTH INSURANCE

## 2015-04-08 ENCOUNTER — Inpatient Hospital Stay (HOSPITAL_COMMUNITY): Payer: PRIVATE HEALTH INSURANCE | Admitting: Anesthesiology

## 2015-04-08 ENCOUNTER — Encounter (HOSPITAL_COMMUNITY)
Admission: RE | Disposition: A | Discharge status: Skilled Nursing Facility | Payer: Self-pay | Source: Ambulatory Visit | Attending: Neurosurgery

## 2015-04-08 ENCOUNTER — Encounter (HOSPITAL_COMMUNITY): Payer: Self-pay | Admitting: Anesthesiology

## 2015-04-08 HISTORY — PX: LAPAROTOMY: SHX154

## 2015-04-08 LAB — COMPREHENSIVE METABOLIC PANEL
ALBUMIN: 1.6 g/dL — AB (ref 3.5–5.0)
ALT: 135 U/L — AB (ref 14–54)
AST: 108 U/L — AB (ref 15–41)
Alkaline Phosphatase: 134 U/L — ABNORMAL HIGH (ref 38–126)
Anion gap: 11 (ref 5–15)
BUN: 22 mg/dL — AB (ref 6–20)
CHLORIDE: 96 mmol/L — AB (ref 101–111)
CO2: 27 mmol/L (ref 22–32)
CREATININE: 0.71 mg/dL (ref 0.44–1.00)
Calcium: 8.5 mg/dL — ABNORMAL LOW (ref 8.9–10.3)
GFR calc Af Amer: >60 mL/min (ref >60)
GLUCOSE: 184 mg/dL — AB (ref 65–99)
POTASSIUM: 4.3 mmol/L (ref 3.5–5.1)
Sodium: 134 mmol/L — ABNORMAL LOW (ref 135–145)
Total Bilirubin: 6.1 mg/dL — ABNORMAL HIGH (ref 0.3–1.2)
Total Protein: 6.3 g/dL — ABNORMAL LOW (ref 6.5–8.1)

## 2015-04-08 LAB — CBC
HEMATOCRIT: 25 % — AB (ref 36.0–46.0)
HEMATOCRIT: 32.4 % — AB (ref 36.0–46.0)
Hemoglobin: 7.7 g/dL — ABNORMAL LOW (ref 12.0–15.0)
Hemoglobin: 10.6 g/dL — ABNORMAL LOW (ref 12.0–15.0)
MCH: 27.8 pg (ref 26.0–34.0)
MCH: 29.5 pg (ref 26.0–34.0)
MCHC: 30.8 g/dL (ref 30.0–36.0)
MCHC: 32.7 g/dL (ref 30.0–36.0)
MCV: 90.3 fL (ref 78.0–100.0)
MCV: 90.3 fL (ref 78.0–100.0)
PLATELETS: 540 10*3/uL — AB (ref 150–400)
PLATELETS: 529 10*3/uL — AB (ref 150–400)
RBC: 2.77 MIL/uL — ABNORMAL LOW (ref 3.87–5.11)
RBC: 3.59 MIL/uL — AB (ref 3.87–5.11)
RDW: 15.1 % (ref 11.5–15.5)
RDW: 15.3 % (ref 11.5–15.5)
WBC: 21.5 10*3/uL — ABNORMAL HIGH (ref 4.0–10.5)
WBC: 19.1 10*3/uL — AB (ref 4.0–10.5)

## 2015-04-08 LAB — GLUCOSE, CAPILLARY
GLUCOSE-CAPILLARY: 139 mg/dL — AB (ref 65–99)
GLUCOSE-CAPILLARY: 140 mg/dL — AB (ref 65–99)
GLUCOSE-CAPILLARY: 133 mg/dL — AB (ref 65–99)
GLUCOSE-CAPILLARY: 166 mg/dL — AB (ref 65–99)
Glucose-Capillary: 138 mg/dL — ABNORMAL HIGH (ref 65–99)
Glucose-Capillary: 152 mg/dL — ABNORMAL HIGH (ref 65–99)

## 2015-04-08 LAB — BASIC METABOLIC PANEL
Anion gap: 10 (ref 5–15)
BUN: 16 mg/dL (ref 6–20)
CALCIUM: 8.5 mg/dL — AB (ref 8.9–10.3)
CHLORIDE: 95 mmol/L — AB (ref 101–111)
CO2: 31 mmol/L (ref 22–32)
CREATININE: 0.76 mg/dL (ref 0.44–1.00)
GFR calc Af Amer: >60 mL/min (ref >60)
GFR calc non Af Amer: >60 mL/min (ref >60)
Glucose, Bld: 172 mg/dL — ABNORMAL HIGH (ref 65–99)
Potassium: 3.8 mmol/L (ref 3.5–5.1)
Sodium: 136 mmol/L (ref 135–145)

## 2015-04-08 LAB — MAGNESIUM: Magnesium: 2.1 mg/dL (ref 1.7–2.4)

## 2015-04-08 LAB — HEPARIN LEVEL (UNFRACTIONATED): Heparin Unfractionated: 0.59 IU/mL (ref 0.30–0.70)

## 2015-04-08 LAB — POTASSIUM
Potassium: 4.2 mmol/L (ref 3.5–5.1)
Potassium: 3.9 mmol/L (ref 3.5–5.1)

## 2015-04-08 LAB — PREPARE RBC (CROSSMATCH)

## 2015-04-08 LAB — TRIGLYCERIDES: Triglycerides: 240 mg/dL — ABNORMAL HIGH (ref <150)

## 2015-04-08 SURGERY — LAPAROTOMY, EXPLORATORY
Anesthesia: General

## 2015-04-08 MED ORDER — SODIUM CHLORIDE 0.9 % IV SOLN
2500.0000 ug | INTRAVENOUS | Status: DC | PRN
  Administered 2015-04-08: 200 ug/h via INTRAVENOUS

## 2015-04-08 MED ORDER — ROCURONIUM BROMIDE 100 MG/10ML IV SOLN
INTRAVENOUS | Status: DC | PRN
  Administered 2015-04-08 (×2): 50 mg via INTRAVENOUS

## 2015-04-08 MED ORDER — FENTANYL CITRATE (PF) 100 MCG/2ML IJ SOLN
INTRAMUSCULAR | Status: DC | PRN
  Administered 2015-04-08: 150 ug via INTRAVENOUS
  Administered 2015-04-08: 100 ug via INTRAVENOUS
  Administered 2015-04-08: 150 ug via INTRAVENOUS

## 2015-04-08 MED ORDER — 0.9 % SODIUM CHLORIDE (POUR BTL) OPTIME
TOPICAL | Status: DC | PRN
  Administered 2015-04-08: 1000 mL

## 2015-04-08 MED ORDER — HEMOSTATIC AGENTS (NO CHARGE) OPTIME
TOPICAL | Status: DC | PRN
  Administered 2015-04-08: 2 via TOPICAL

## 2015-04-08 MED ORDER — ONDANSETRON HCL 4 MG/2ML IJ SOLN
INTRAMUSCULAR | Status: DC | PRN
  Administered 2015-04-08: 4 mg via INTRAVENOUS

## 2015-04-08 MED ORDER — VECURONIUM BROMIDE 10 MG IV SOLR
INTRAVENOUS | Status: AC
  Filled 2015-04-08: qty 10

## 2015-04-08 MED ORDER — MIDAZOLAM HCL 5 MG/5ML IJ SOLN
INTRAMUSCULAR | Status: DC | PRN
  Administered 2015-04-08: 2 mg via INTRAVENOUS

## 2015-04-08 MED ORDER — VECURONIUM BROMIDE 10 MG IV SOLR
INTRAVENOUS | Status: DC | PRN
  Administered 2015-04-08 (×2): 3 mg via INTRAVENOUS

## 2015-04-08 MED ORDER — MIDAZOLAM HCL 2 MG/2ML IJ SOLN
INTRAMUSCULAR | Status: AC
  Filled 2015-04-08: qty 2

## 2015-04-08 MED ORDER — FENTANYL CITRATE (PF) 250 MCG/5ML IJ SOLN
INTRAMUSCULAR | Status: AC
  Filled 2015-04-08: qty 5

## 2015-04-08 MED ORDER — M.V.I. ADULT IV INJ
INJECTION | INTRAVENOUS | Status: AC
  Administered 2015-04-08: 18:00:00 via INTRAVENOUS
  Filled 2015-04-08: qty 2640

## 2015-04-08 MED ORDER — SODIUM CHLORIDE 0.9 % IV SOLN: Freq: Once | INTRAVENOUS | Status: DC

## 2015-04-08 MED ORDER — DEXMEDETOMIDINE HCL IN NACL 400 MCG/100ML IV SOLN
INTRAVENOUS | Status: DC | PRN
  Administered 2015-04-08: .7 ug/kg/h via INTRAVENOUS

## 2015-04-08 MED ORDER — ROCURONIUM BROMIDE 50 MG/5ML IV SOLN
INTRAVENOUS | Status: AC
  Filled 2015-04-08: qty 3

## 2015-04-08 MED ORDER — SODIUM CHLORIDE 0.9 % IV SOLN
INTRAVENOUS | Status: DC | PRN
  Administered 2015-04-08: 15:00:00 via INTRAVENOUS

## 2015-04-08 MED ORDER — SODIUM CHLORIDE 0.9 % IJ SOLN
INTRAMUSCULAR | Status: AC
  Filled 2015-04-08: qty 10

## 2015-04-08 SURGICAL SUPPLY — 45 items
BINDER ABDOMINAL 12 ML 46-62 (SOFTGOODS) ×3 IMPLANT
CANISTER SUCTION 2500CC (MISCELLANEOUS) ×3 IMPLANT
CLEANER TIP ELECTROSURG 2X2 (MISCELLANEOUS) ×3 IMPLANT
COVER SURGICAL LIGHT HANDLE (MISCELLANEOUS) ×3 IMPLANT
DRAIN CHANNEL 19F RND (DRAIN) ×6 IMPLANT
DRAPE LAPAROSCOPIC ABDOMINAL (DRAPES) ×3 IMPLANT
DRAPE WARM FLUID 44X44 (DRAPE) ×3 IMPLANT
DRSG OPSITE POSTOP 4X10 (GAUZE/BANDAGES/DRESSINGS) ×3 IMPLANT
DRSG OPSITE POSTOP 4X6 (GAUZE/BANDAGES/DRESSINGS) ×3 IMPLANT
DRSG OPSITE POSTOP 4X8 (GAUZE/BANDAGES/DRESSINGS) ×3 IMPLANT
ELECT BLADE 6.5 EXT (BLADE) ×3 IMPLANT
ELECT REM PT RETURN 9FT ADLT (ELECTROSURGICAL) ×3
ELECTRODE REM PT RTRN 9FT ADLT (ELECTROSURGICAL) ×1 IMPLANT
EVACUATOR SILICONE 100CC (DRAIN) ×6 IMPLANT
GAUZE SPONGE 4X4 12PLY STRL (GAUZE/BANDAGES/DRESSINGS) ×3 IMPLANT
GLOVE BIO SURGEON STRL SZ7.5 (GLOVE) ×3 IMPLANT
GLOVE BIOGEL PI IND STRL 8 (GLOVE) ×1 IMPLANT
GLOVE BIOGEL PI INDICATOR 8 (GLOVE) ×2
GOWN STRL REUS W/ TWL LRG LVL3 (GOWN DISPOSABLE) ×3 IMPLANT
GOWN STRL REUS W/ TWL XL LVL3 (GOWN DISPOSABLE) ×1 IMPLANT
GOWN STRL REUS W/TWL LRG LVL3 (GOWN DISPOSABLE) ×6
GOWN STRL REUS W/TWL XL LVL3 (GOWN DISPOSABLE) ×2
KIT BASIN OR (CUSTOM PROCEDURE TRAY) ×3 IMPLANT
KIT ROOM TURNOVER OR (KITS) ×3 IMPLANT
MESH PHASIX ST 25X30 (Mesh General) ×3 IMPLANT
NS IRRIG 1000ML POUR BTL (IV SOLUTION) ×6 IMPLANT
PACK GENERAL/GYN (CUSTOM PROCEDURE TRAY) ×3 IMPLANT
PAD ARMBOARD 7.5X6 YLW CONV (MISCELLANEOUS) ×3 IMPLANT
POUCH STERILIZATION 7X13 (STERILIZATION PRODUCTS) ×3 IMPLANT
STAPLER VISISTAT 35W (STAPLE) ×6 IMPLANT
SUCTION POOLE TIP (SUCTIONS) ×3 IMPLANT
SUT ETHILON 2 0 FS 18 (SUTURE) ×6 IMPLANT
SUT ETHILON 2 LR (SUTURE) ×6 IMPLANT
SUT PDS AB 1 TP1 96 (SUTURE) ×6 IMPLANT
SUT PROLENE 1 XLH 60 (SUTURE) ×9 IMPLANT
SUT SILK 2 0 SH CR/8 (SUTURE) ×3 IMPLANT
SUT SILK 2 0 TIES 10X30 (SUTURE) ×3 IMPLANT
SUT SILK 3 0 SH CR/8 (SUTURE) ×3 IMPLANT
SUT SILK 3 0 TIES 10X30 (SUTURE) ×3 IMPLANT
SUT VIC AB 0 CT1 18XCR BRD 8 (SUTURE) ×4 IMPLANT
SUT VIC AB 0 CT1 8-18 (SUTURE) ×8
SUT VIC AB 2-0 SH 18 (SUTURE) ×3 IMPLANT
TOWEL OR 17X24 6PK STRL BLUE (TOWEL DISPOSABLE) ×3 IMPLANT
TOWEL OR 17X26 10 PK STRL BLUE (TOWEL DISPOSABLE) ×3 IMPLANT
YANKAUER SUCT BULB TIP NO VENT (SUCTIONS) IMPLANT

## 2015-04-08 NOTE — Plan of Care (Signed)
1450 To OR via bed, bagged by CRNA and on monitor

## 2015-04-08 NOTE — Progress Notes (Signed)
ANTICOAGULATION CONSULT NOTE - Follow Up Consult  Pharmacy Consult for Heparin + Zosyn Indication: DVT + HCAP  Allergies  Allergen Reactions  . Propoxyphene Other (See Comments)    Unspecified    Patient Measurements: Height:  (160 cm) Weight: 278 lb (126.1 kg) IBW/kg (Calculated) : 52.4 Heparin Dosing Weight: 84.5 kg  Vital Signs: Temp: 98.7 F (37.1 C) (03/01 0838) Temp Source: Oral (03/01 0838) BP: 94/52 mmHg (03/01 1000) Pulse Rate: 81 (03/01 1000)  Labs:  Recent Labs  04/06/15 0503 04/06/15 0504 04/07/15 0433 04/07/15 1520 04/08/15 0415  HGB 7.9*  --  8.1*  --  7.7*  HCT 25.1*  --  25.3*  --  25.0*  PLT 455*  --  477*  --  540*  HEPARINUNFRC  --  0.43 0.51  --  0.59  CREATININE 0.84  --   --  0.83 0.76    Estimated Creatinine Clearance: 114.8 mL/min (by C-G formula based on Cr of 0.76).   Assessment:  AC: Heparin for DVT intra-op. Not candidate for pharmacomechanical thrombectomy with injury in L CIV. Per Dr. Imogene Burn - ok to give full bolus - stopped bleeding.  HL 0.59 in goal. Hgb down to 7.7. -To switch to lovenox when abdomen closed. Plan for OR 3/1 to attempt closure  Infectious Disease: abx D#7/7 to r/o abdominal infxn/HAP. AF, WBC trending up 17.3 > 22.2>21.5. Afebrile. CrCl>100  2/23 vanc>>2/26 2/23 zosyn>> 2/17 cefaz>>2/18  2/23 BCx1>>Negative 2/23 sputum>>few yeast (candida) MRSA pcr: neg  Goal of Therapy:  Heparin level 0.3-0.7 units/ml Monitor platelets by anticoagulation protocol: Yes   Plan:  Continue heparin at 2300 units/hr-hold heparin this AM for trip to OR (Switch to lovenox when abdomen closed - plan for OR 3/1) F/u long-term AC plans after all OR trips completed Continue zosyn 3.375g IV q8, please d/c today per CCM note.   Aaric Dolph S. Merilynn Finland, PharmD, BCPS Clinical Staff Pharmacist Pager [phone]  Misty Stanley Stillinger 04/08/2015,10:38 AM

## 2015-04-08 NOTE — Progress Notes (Signed)
PARENTERAL NUTRITION CONSULT NOTE - FOLLOW UP  Pharmacy Consult:  TPN Indication:  Open abdomen  Allergies  Allergen Reactions  . Propoxyphene Other (See Comments)    Unspecified    Patient Measurements: Height:  (160 cm) Weight: 278 lb (126.1 kg) IBW/kg (Calculated) : 52.4  BMI = 59.7  Vital Signs: Temp: 98.7 F (37.1 C) (03/01 0838) Temp Source: Oral (03/01 0838) BP: 94/52 mmHg (03/01 1000) Pulse Rate: 81 (03/01 1000) Intake/Output from previous day: 02/28 0701 - 03/01 0700 In: 4841.1 [I.V.:1676.4; NG/GT:250; IV Piggyback:150; TPN:2764.7] Out: 5785 [Urine:4285; Emesis/NG output:900; Drains:600]  Labs:  Recent Labs  04/06/15 0503 04/07/15 0433 04/08/15 0415  WBC 17.3* 22.2* 21.5*  HGB 7.9* 8.1* 7.7*  HCT 25.1* 25.3* 25.0*  PLT 455* 477* 540*     Recent Labs  04/06/15 0503  04/07/15 1520 04/07/15 2315 04/08/15 0415  NA 133*  --  134*  --  136  K 3.5  < > 3.8 4.2 3.8  CL 97*  --  95*  --  95*  CO2 27  --  31  --  31  GLUCOSE 179*  --  163*  --  172*  BUN 18  --  19  --  16  CREATININE 0.84  --  0.83  --  0.76  CALCIUM 8.0*  --  8.3*  --  8.5*  MG 2.0  --   --   --  2.1  PHOS 3.1  --  3.2  --   --   PROT 5.8*  --   --   --   --   ALBUMIN 1.5*  --   --   --   --   AST 67*  --   --   --   --   ALT 76*  --   --   --   --   ALKPHOS 78  --   --   --   --   BILITOT 4.8*  --   --   --   --   PREALBUMIN 9.7*  --   --   --   --   TRIG 159*  --   --   --  240*  < > = values in this interval not displayed. Estimated Creatinine Clearance: 114.8 mL/min (by C-G formula based on Cr of 0.76).    Recent Labs  04/07/15 2356 04/08/15 0400 04/08/15 0832  GLUCAP 138* 139* 140*     Insulin Requirements in the past 24 hours:  9 units SSI  Current Nutrition:  Clinimix E 5/15 at goal rate of 110 ml/hr and start 20% lipid at 10 ml/hr on Tues and Thurs only to minimize kCal provision.  TPN will provide a weekly average of 2011 kCal and 32gm protein per day,  exceeding kCal goal by 6% and meeting 100% of protein need  Nutritional Goals:  1450-1890 kCal, 130-140 grams of protein per day   Assessment: 44 YOF admitted on 03/27/15 with back and leg pain and underwent L5 laminectomy with facetectomy later that day.  Intraoperatively patient went into shock and CT revealed large left RP hematoma.  Patient returned to the OR for ex-lap with repair of left iliac vein, ligation of distal left CIA, left CIA-CFA bypass, and placement of abdominal VAC.  Pharmacy consulted to manage TPN for nutritional support.  GI: hx GERD / obesity.  Baseline prealbumin low at 8.4 >> 9.7.  Frequent OR visits for abd washout (2/19, 2/21, 2/27) - too edematous  for abd closure on 2/23, unable to close fascia over intestines on 2/27, plan for biomesh today.  NG/emesis O/P , drain O/P 600 mL.  PPI IV, PO docusate, Reglan   Endo: no hx DM - hypoglycemic prior to TPN initiation, CBGs 135-172 Lytes: K+ 3.8, lytes stable Renal: SCr stable, CrCL > 100 ml/min - good UOP 1.4 ml/kg/hr, NS at 10 ml/hr Pulm: intubated, FiO2 40%, may need trach Cards: angina - BP soft, HR controlled - Lasix IV (dose increased) AC: Heparin for new left femoral DVT (dx 03/29/15) - hgb 7.7, plts elevated at 540 Hepatobil: AST/ALT mildly elevated 67/76, tbili increased to 4.8 (no jaundice).  TG 159>>240 - watch Neuro: Propofol >> Precedex, Fentanyl gtt - GCS 13-15, CPOT 0 ID: afebrile, WBC increased to 21.5, on Zosyn Best Practices: heparin gtt, MC  TPN Access: right IJ placed 03/27/15 TPN start date: 2/21 >>   Plan:  - Continue Clinimix E 5/15 at goal rate of 110 ml/hr and continue 20% lipid at 10 ml/hr on Tues and Thurs only to minimize kCal provision.  TPN will provide a weekly average of 2011 kCal and 132gm protein per day, exceeding kCal goal by 6% and meeting 100% of protein need. - Daily multivitamin and trace elements, watch tbili and monitor for jaundice - Continue moderate SSI Q4H and add 10  units regular insulin to TPN given plan to cycle TPN and expect glucose to be elevated during peak infusion. - Consider cycling TPN after abdominal closure   [facility] [place], [place].D., BCPS Clinical Pharmacist Pager: [phone] 04/08/2015 10:16 AM

## 2015-04-08 NOTE — Anesthesia Preprocedure Evaluation (Signed)
Anesthesia Evaluation  Patient identified by MRN, date of birth, ID band Patient awake  General Assessment Comment:Pt intubated.  Reviewed: Allergy & Precautions, NPO status , Patient's Chart, lab work & pertinent test results  Airway Mallampati: Intubated       Dental   Pulmonary asthma ,    breath sounds clear to auscultation       Cardiovascular + angina + dysrhythmias  Rhythm:regular Rate:Normal     Neuro/Psych    GI/Hepatic GERD  ,  Endo/Other  Morbid obesity  Renal/GU      Musculoskeletal  (+) Arthritis ,   Abdominal   Peds  Hematology   Anesthesia Other Findings   Reproductive/Obstetrics                             Anesthesia Physical Anesthesia Plan  ASA: III  Anesthesia Plan: General   Post-op Pain Management:    Induction: Intravenous  Airway Management Planned: Oral ETT  Additional Equipment:   Intra-op Plan:   Post-operative Plan: Post-operative intubation/ventilation  Informed Consent: I have reviewed the patients History and Physical, chart, labs and discussed the procedure including the risks, benefits and alternatives for the proposed anesthesia with the patient or authorized representative who has indicated his/her understanding and acceptance.     Plan Discussed with: CRNA, Anesthesiologist and Surgeon  Anesthesia Plan Comments:         Anesthesia Quick Evaluation

## 2015-04-08 NOTE — Progress Notes (Addendum)
2 Days Post-Op  Subjective: Pt with no acute changes overnight  Objective: Vital signs in last 24 hours: Temp:  [98.4 F (36.9 C)-99.6 F (37.6 C)] 99.6 F (37.6 C) (03/01 0413) Pulse Rate:  [71-99] 73 (03/01 0700) Resp:  [20-33] 21 (03/01 0700) BP: (86-111)/(44-67) 89/46 mmHg (03/01 0700) SpO2:  [99 %-100 %] 100 % (03/01 0700) FiO2 (%):  [40 %] 40 % (03/01 0400) Last BM Date: 04/07/15  Intake/Output from previous day: 02/28 0701 - 03/01 0700 In: 4841.1 [I.V.:1676.4; NG/GT:250; IV Piggyback:150; TPN:2764.7] Out: 5785 [Urine:4285; Emesis/NG output:900; Drains:600] Intake/Output this shift:    General appearance: alert Abd: Vac in place Lab Results:   Recent Labs  04/07/15 0433 04/08/15 0415  WBC 22.2* 21.5*  HGB 8.1* 7.7*  HCT 25.3* 25.0*  PLT 477* 540*   BMET  Recent Labs  04/07/15 1520 04/07/15 2315 04/08/15 0415  NA 134*  --  136  K 3.8 4.2 3.8  CL 95*  --  95*  CO2 31  --  31  GLUCOSE 163*  --  172*  BUN 19  --  16  CREATININE 0.83  --  0.76  CALCIUM 8.3*  --  8.5*    Dg Chest Port 1 View  04/07/2015  CLINICAL DATA:  Respiratory failure. EXAM: PORTABLE CHEST 1 VIEW COMPARISON:  04/04/2015. FINDINGS: Interim placement of PICC line, its tip is at the cavoatrial junction Endotracheal tube, NG tube scratched in stable position. Mediastinum and hilar structures normal. Heart size stable. Low lung volumes with mild bibasilar atelectasis. No pleural effusion or pneumothorax . IMPRESSION: 1. Interim placement of PICC line, its tip is at the cavoatrial junction . Remaining lines and tubes in stable position. 2. Low lung volumes with mild bibasilar atelectasis. Electronically Signed   By: Maisie Fus  Register   On: 04/07/2015 07:45    Anti-infectives: Anti-infectives    Start     Dose/Rate Route Frequency Ordered Stop   04/02/15 2330  vancomycin (VANCOCIN) IVPB 1000 mg/200 mL premix  Status:  Discontinued     1,000 mg 200 mL/hr over 60 Minutes Intravenous Every 12  hours 04/02/15 1015 04/05/15 0849   04/02/15 1830  piperacillin-tazobactam (ZOSYN) IVPB 3.375 g     3.375 g 12.5 mL/hr over 240 Minutes Intravenous 3 times per day 04/02/15 1015     04/02/15 1100  cefUROXime (ZINACEF) 1.5 g in dextrose 5 % 50 mL IVPB  Status:  Discontinued     1.5 g 100 mL/hr over 30 Minutes Intravenous To ShortStay Surgical 04/01/15 1106 04/02/15 1448   04/02/15 1100  vancomycin (VANCOCIN) 2,500 mg in sodium chloride 0.9 % 500 mL IVPB     2,500 mg 250 mL/hr over 120 Minutes Intravenous  Once 04/02/15 1029 04/02/15 1342   04/02/15 1030  vancomycin (VANCOCIN) 2,250 mg in sodium chloride 0.9 % 500 mL IVPB  Status:  Discontinued     2,250 mg 250 mL/hr over 120 Minutes Intravenous  Once 04/02/15 1015 04/02/15 1029   04/02/15 1030  piperacillin-tazobactam (ZOSYN) IVPB 3.375 g     3.375 g 100 mL/hr over 30 Minutes Intravenous  Once 04/02/15 1015 04/02/15 1212   03/27/15 2130  cefUROXime (ZINACEF) 1.5 g in dextrose 5 % 50 mL IVPB  Status:  Discontinued     1.5 g 100 mL/hr over 30 Minutes Intravenous Every 12 hours 03/27/15 2124 03/27/15 2132   03/27/15 1600  ceFAZolin (ANCEF) IVPB 2 g/50 mL premix     2 g 100 mL/hr over 30  Minutes Intravenous Every 8 hours 03/27/15 1322 03/28/15 0759   03/27/15 1447  ceFAZolin (ANCEF) 1-5 GM-% IVPB    Comments:  Merril Abbe   : cabinet override      03/27/15 1447 03/28/15 0259   03/27/15 0800  bacitracin 50,000 Units in sodium chloride irrigation 0.9 % 500 mL irrigation  Status:  Discontinued       As needed 03/27/15 0943 03/27/15 1223   03/27/15 0700  ceFAZolin (ANCEF) 3 g in dextrose 5 % 50 mL IVPB     3 g 130 mL/hr over 30 Minutes Intravenous To ShortStay Surgical 03/26/15 1409 03/27/15 0750      Assessment/Plan: s/p Procedure(s): ABDOMINAL WASHOUT (N/A) APPLICATION OF WOUND VAC (N/A) -Plan for OR today for abdominal closure -Hold Heparin gtt this AM as she will be going later this afternoon to OR  LOS: 12 days    Addendum: I  d/w the family( mom, Dad, husband) that there is almost a 100% chance of hernia recurrence.  I d/w them that the main goal of this surgery was to close her abdominal wall to allow for extubation.  They understood and agreed  Marigene Ehlers., Jed Limerick 04/08/2015

## 2015-04-08 NOTE — Transfer of Care (Signed)
Immediate Anesthesia Transfer of Care Note  Patient: Kristina Estes  Procedure(s) Performed: Procedure(s): EXPLORATORY LAPAROTOMY/ABD. WALL CLOSURE (N/A)  Patient Location: SICU  Anesthesia Type:General  Level of Consciousness: sedated and Patient remains intubated per anesthesia plan  Airway & Oxygen Therapy: Patient remains intubated per anesthesia plan and Patient placed on Ventilator (see vital sign flow sheet for setting)  Post-op Assessment: Report given to RN and Post -op Vital signs reviewed and stable  Post vital signs: Reviewed and stable  Last Vitals:  Filed Vitals:   04/08/15 1430 04/08/15 1440  BP: 122/65   Pulse:    Temp: 37.1 C 37.1 C  Resp: 24 23    Complications: No apparent anesthesia complications

## 2015-04-08 NOTE — Progress Notes (Signed)
Patient currently intubated with plans to go to OR today for abdominal closure. CSW will continue to follow for disposition.   Noe Gens, LCSW [facility] Clinical Social Worker [phone]

## 2015-04-08 NOTE — Progress Notes (Signed)
PULMONARY / CRITICAL CARE MEDICINE   Name: Kristina Estes MRN: [id number] DOB: 1970-12-24    ADMISSION DATE:  03/27/2015  CONSULTATION DATE: 03/27/15  REFERRING MD: Conchita Paris  CHIEF COMPLAINT: Hemorrhagic shock  SUBJECTIVE:  Tolerating some pressure support.   VITAL SIGNS: BP 97/36 mmHg  Pulse 79  Temp(Src) 98.7 F (37.1 C) (Oral)  Resp 24  Ht  (1.6 m)  Wt 278 lb (126.1 kg)  BMI 49.26 kg/m2  SpO2 98%  LMP 03/27/2015  VENTILATOR SETTINGS: Vent Mode:  [-] PSV;CPAP FiO2 (%):  [40 %] 40 % Set Rate:  [20 bmp] 20 bmp Vt Set:  [450 mL] 450 mL PEEP:  [5 cmH20] 5 cmH20 Pressure Support:  [15 cmH20] 15 cmH20 Plateau Pressure:  [23 cmH20-26 cmH20] 23 cmH20  INTAKE / OUTPUT: I/O last 3 completed shifts: In: 6941.9 [I.V.:2337.2; NG/GT:320; IV Piggyback:200] Out: 9545 [Urine:7195; Emesis/NG output:1550; Drains:800]   PHYSICAL EXAMINATION: General: RASS 0 Neuro: follows commands HEENT: ETT in place Cardiac: regular, no murmur Chest: no wheeze Abd: wound vac in place Ext: 1+ edema Skin: no rashes   LABS:  BMET  Recent Labs Lab 04/06/15 0503  04/07/15 1520 04/07/15 2315 04/08/15 0415  NA 133*  --  134*  --  136  K 3.5  < > 3.8 4.2 3.8  CL 97*  --  95*  --  95*  CO2 27  --  31  --  31  BUN 18  --  19  --  16  CREATININE 0.84  --  0.83  --  0.76  GLUCOSE 179*  --  163*  --  172*  < > = values in this interval not displayed.  Electrolytes  Recent Labs Lab 04/02/15 0456  04/05/15 0410 04/06/15 0503 04/07/15 1520 04/08/15 0415  CALCIUM 8.5*  < > 8.1* 8.0* 8.3* 8.5*  MG 1.9  --   --  2.0  --  2.1  PHOS 2.8  --  3.2 3.1 3.2  --   < > = values in this interval not displayed.  CBC  Recent Labs Lab 04/06/15 0503 04/07/15 0433 04/08/15 0415  WBC 17.3* 22.2* 21.5*  HGB 7.9* 8.1* 7.7*  HCT 25.1* 25.3* 25.0*  PLT 455* 477* 540*    Coag's  Recent Labs Lab 04/02/15 0730  INR 1.20    Sepsis Markers  Recent Labs Lab 04/02/15 1100   LATICACIDVEN 0.8    Liver Enzymes  Recent Labs Lab 04/02/15 0456 04/06/15 0503  AST 46* 67*  ALT 29 76*  ALKPHOS 44 78  BILITOT 1.0 4.8*  ALBUMIN 2.1* 1.5*   Glucose  Recent Labs Lab 04/07/15 0756 04/07/15 1231 04/07/15 1555 04/07/15 1924 04/07/15 2356 04/08/15 0400  GLUCAP 170* 135* 162* 156* 138* 139*    Imaging Dg Chest Port 1 View  04/08/2015  CLINICAL DATA:  Acute respiratory failure EXAM: PORTABLE CHEST 1 VIEW COMPARISON:  04/07/2015 FINDINGS: Cardiac shadow is at the upper limits of normal in size but accentuated by the portable technique. An endotracheal tube, nasogastric catheter and right PICC line are again seen and stable. The overall inspiratory effort is poor although no focal confluent infiltrate is seen. No bony abnormality is noted. IMPRESSION: Tubes and lines as described above. Poor inspiratory effort Electronically Signed   By: Alcide Clever M.D.   On: 04/08/2015 07:50      STUDIES:  2/17 CT abd/pelvis >>Lt sided retroperitoneal hematoma 2/19 Doppler b/l legs >> DVT Lt common femoral vein  CULTURES: 2/23 Blood >>  negative 2/24 Sputum >> Few candida  ANTIBIOTICS: 2/23 Vancomycin >> 2/26 2/23 Zosyn >>   SIGNIFICANT EVENTS: 2/17 L5 laminectomy >> retroperitoneal hemorrhage; vascular surgery consulted >> Lt common iliac to Lt common femoral bypass 2/19 abdominal washout, wound vac; DVT Lt leg >> start heparin gtt 2/20 General surgery consulted 2/21 To OR 2/23 To OR 2/27 To OR 3/01 To OR  LINES/TUBES: 2/17 IJ CVL >> 2/25 2/17 ETT >> 2/25 Rt PICC >>   DISCUSSION: 45 yo female with back and leg pain from L5-S1 spondylolisthesis.  She had L5 laminectomy on 2/17 >> c/b retroperitoneal hemorrhage.  She required laparotomy with Lt common iliac to common femoral bypass.  Her abdomen has remained open due to edema, and she has required repeated trips to the OR.  She remains on the vent for OR trips.  She has hx of Asthma, GERD.  ASSESSMENT /  PLAN:  PULMONARY A: Acute respiratory failure in setting of retroperitoneal hemorrhage. P:  Pressure support wean as tolerated >> defer extubation until surgical issues improved Might need trach to assist with vent weaning F/u CXR  CARDIOVASCULAR A: Hemorrhagic shock 2nd to retroperitoneal bleeding >> resolved. Lt leg DVT noted 2/19. P:  Continue heparin gtt Monitor hemodynamics  RENAL A:  Hypervolemia >> improved. Hypokalemia. P:  Even to negative fluid balance as tolerated >> hold lasix 3/01 Replace electrolytes as needed  GASTROINTESTINAL A:  S/p laparotomy with difficulty closing abdominal wound due to edema. Nutrition. P: Wound vac, post op care per surgery Plan for return to OR 3/01 TNA, reglan per surgery Protonix for SUP  HEMATOLOGIC A:  Anemia 2nd to acute blood loss, and critical illness. P:  F/u CBC Transfuse for Hb < 7  INFECTIOUS A:  HCAP. P:  Day 7/7 of zosyn  ENDOCRINE A:  Hyperglycemia. P:  SSI  NEUROLOGIC A:  L5-S1 spondylolisthesis. Sedation. P:  RASS goal 0 to -1  Updated pt's mother at bedside.  CC time 31 minutes.  Coralyn Helling, MD [facility] Pulmonary/Critical Care 04/08/2015, 8:57 AM Pager:  [phone] After 3pm call: [phone]

## 2015-04-08 NOTE — Op Note (Signed)
04/08/2015  4:57 PM  PATIENT:  [name]  45 y.o. female  PRE-OPERATIVE DIAGNOSIS:  Open Abdomen  POST-OPERATIVE DIAGNOSIS:  Open Abdomen  PROCEDURE:  Procedure(s): EXPLORATORY LAPAROTOMY/ABD. WALL CLOSURE WITH MESH (N/A)  SURGEON:  Surgeon(s) and Role:    * Axel Filler, MD - Primary  ANESTHESIA:   general  EBL:  Total I/O In: 2012.4 [I.V.:317.4; Blood:685; IV Piggyback:50; TPN:960] Out: 875 [Urine:835; Blood:40]  BLOOD ADMINISTERED:2U PRBC  DRAINS: 19 French Blake drains 2 in the subcutaneous space, over the mesh  LOCAL MEDICATIONS USED:  NONE  SPECIMEN:  No Specimen  DISPOSITION OF SPECIMEN:  N/A  COUNTS:  YES  TOURNIQUET:  * No tourniquets in log *  DICTATION: .Dragon Dictation Indications for procedure: Patient is a 45 year old female with an open abdomen secondary to a previous back surgery, complicated by a vessel injury. Patient has been undergoing abdominal VAC changes. Secondary to the fact abdominal wall laxity neurosurgery was consult to help with abdominal wall closure.   Details of procedure:   After the patient was consented she was taken back to the operating room intubated from the ICU. Patient underwent general anesthesia. Patient had abdominal wall prepped and draped in the usual sterile fashion. A timeout was called all facts verified.  The abdominal wound VAC was removed from the abdominal cavity. At this time it appeared that the bowel had formed a bowel brain.  The bowel easily was separated from the fascia laterally and circumferentially. At this time the fascia was grasped with Kockers and fascia was retracted medially. A subcutaneous skin flap was then raised circumferentially to help with reapproximation of the fascia as well as the skin.  At this time a piece of 25 x 30 cm piece of Phasix ST mesh.  This was circumferentially stitch the fascia using a #1 Prolene in a continuous fashion. 3 stitches were used to circumferentially so  within the mesh to the fascia. The fascia lay taut. Pressures were stable at this time.  At this time Arixta   was used to cover the simultaneous flaps to help with hemostasis.  At this time 0 Vicryl's were used to approximate the subcutaneous tissue. A 19 French Blake drains were used and placed over the mesh. These were brought out through stab incisions bilateral lower quadrants. These were secured to the skin using 2-0 nylon's.  The skin was then reapproximated using skin staples. The patient how the procedure well was taken back to the ICU in stable condition.   PLAN  OF CARE: Admit to inpatient    PATIENT DISPOSITION:  PACU - hemodynamically stable.   Delay start of Pharmacological VTE agent (>24hrs) due to surgical blood loss or risk of bleeding: not applicable

## 2015-04-09 ENCOUNTER — Encounter (HOSPITAL_COMMUNITY)
Admission: RE | Disposition: A | Discharge status: Skilled Nursing Facility | Payer: Self-pay | Source: Ambulatory Visit | Attending: Neurosurgery

## 2015-04-09 ENCOUNTER — Encounter (HOSPITAL_COMMUNITY): Payer: PRIVATE HEALTH INSURANCE

## 2015-04-09 ENCOUNTER — Inpatient Hospital Stay (HOSPITAL_COMMUNITY): Payer: PRIVATE HEALTH INSURANCE

## 2015-04-09 ENCOUNTER — Encounter (HOSPITAL_COMMUNITY): Payer: Self-pay | Admitting: General Surgery

## 2015-04-09 DIAGNOSIS — L8990 Pressure ulcer of unspecified site, unspecified stage: Secondary | ICD-10-CM | POA: Insufficient documentation

## 2015-04-09 LAB — GLUCOSE, CAPILLARY
GLUCOSE-CAPILLARY: 177 mg/dL — AB (ref 65–99)
GLUCOSE-CAPILLARY: 173 mg/dL — AB (ref 65–99)
GLUCOSE-CAPILLARY: 139 mg/dL — AB (ref 65–99)
Glucose-Capillary: 134 mg/dL — ABNORMAL HIGH (ref 65–99)
Glucose-Capillary: 171 mg/dL — ABNORMAL HIGH (ref 65–99)
Glucose-Capillary: 173 mg/dL — ABNORMAL HIGH (ref 65–99)
Glucose-Capillary: 192 mg/dL — ABNORMAL HIGH (ref 65–99)

## 2015-04-09 LAB — CBC
HCT: 31.1 % — ABNORMAL LOW (ref 36.0–46.0)
HCT: 28.9 % — ABNORMAL LOW (ref 36.0–46.0)
Hemoglobin: 9.9 g/dL — ABNORMAL LOW (ref 12.0–15.0)
Hemoglobin: 9.5 g/dL — ABNORMAL LOW (ref 12.0–15.0)
MCH: 28.4 pg (ref 26.0–34.0)
MCH: 29 pg (ref 26.0–34.0)
MCHC: 31.8 g/dL (ref 30.0–36.0)
MCHC: 32.9 g/dL (ref 30.0–36.0)
MCV: 89.1 fL (ref 78.0–100.0)
MCV: 88.1 fL (ref 78.0–100.0)
PLATELETS: 642 10*3/uL — AB (ref 150–400)
Platelets: 621 10*3/uL — ABNORMAL HIGH (ref 150–400)
RBC: 3.49 MIL/uL — ABNORMAL LOW (ref 3.87–5.11)
RBC: 3.28 MIL/uL — ABNORMAL LOW (ref 3.87–5.11)
RDW: 15.5 % (ref 11.5–15.5)
RDW: 15.6 % — AB (ref 11.5–15.5)
WBC: 22.8 10*3/uL — ABNORMAL HIGH (ref 4.0–10.5)
WBC: 27.9 10*3/uL — ABNORMAL HIGH (ref 4.0–10.5)

## 2015-04-09 LAB — BASIC METABOLIC PANEL
Anion gap: 11 (ref 5–15)
BUN: 19 mg/dL (ref 6–20)
CALCIUM: 8.5 mg/dL — AB (ref 8.9–10.3)
CO2: 27 mmol/L (ref 22–32)
CREATININE: 0.81 mg/dL (ref 0.44–1.00)
Chloride: 97 mmol/L — ABNORMAL LOW (ref 101–111)
Glucose, Bld: 176 mg/dL — ABNORMAL HIGH (ref 65–99)
Potassium: 4.3 mmol/L (ref 3.5–5.1)
SODIUM: 135 mmol/L (ref 135–145)

## 2015-04-09 LAB — TYPE AND SCREEN
ABO/RH(D): O POS
ANTIBODY SCREEN: NEGATIVE
UNIT DIVISION: 0
Unit division: 0

## 2015-04-09 LAB — COMPREHENSIVE METABOLIC PANEL
ALBUMIN: 1.5 g/dL — AB (ref 3.5–5.0)
ALT: 116 U/L — ABNORMAL HIGH (ref 14–54)
ANION GAP: 10 (ref 5–15)
AST: 70 U/L — ABNORMAL HIGH (ref 15–41)
Alkaline Phosphatase: 122 U/L (ref 38–126)
BILIRUBIN TOTAL: 4.9 mg/dL — AB (ref 0.3–1.2)
BUN: 20 mg/dL (ref 6–20)
CO2: 28 mmol/L (ref 22–32)
Calcium: 8.5 mg/dL — ABNORMAL LOW (ref 8.9–10.3)
Chloride: 97 mmol/L — ABNORMAL LOW (ref 101–111)
Creatinine, Ser: 0.82 mg/dL (ref 0.44–1.00)
GFR calc Af Amer: >60 mL/min (ref >60)
GFR calc non Af Amer: >60 mL/min (ref >60)
GLUCOSE: 197 mg/dL — AB (ref 65–99)
POTASSIUM: 4.8 mmol/L (ref 3.5–5.1)
SODIUM: 135 mmol/L (ref 135–145)
TOTAL PROTEIN: 6.3 g/dL — AB (ref 6.5–8.1)

## 2015-04-09 LAB — HEPARIN LEVEL (UNFRACTIONATED): Heparin Unfractionated: 0.52 IU/mL (ref 0.30–0.70)

## 2015-04-09 LAB — MAGNESIUM: Magnesium: 2.1 mg/dL (ref 1.7–2.4)

## 2015-04-09 LAB — PHOSPHORUS: PHOSPHORUS: 3.5 mg/dL (ref 2.5–4.6)

## 2015-04-09 SURGERY — SEPTOPLASTY, NOSE, WITH NASAL TURBINATE REDUCTION
Anesthesia: General | Laterality: Bilateral

## 2015-04-09 MED ORDER — DIPHENHYDRAMINE HCL 12.5 MG/5ML PO ELIX: 12.5000 mg | ORAL_SOLUTION | Freq: Four times a day (QID) | ORAL | Status: DC | PRN

## 2015-04-09 MED ORDER — MORPHINE SULFATE (PF) 2 MG/ML IV SOLN
2.0000 mg | INTRAVENOUS | Status: DC | PRN
  Administered 2015-04-09: 2 mg via INTRAVENOUS
  Filled 2015-04-09: qty 1

## 2015-04-09 MED ORDER — ONDANSETRON HCL 4 MG/2ML IJ SOLN
4.0000 mg | Freq: Four times a day (QID) | INTRAMUSCULAR | Status: DC | PRN
  Administered 2015-04-10 – 2015-04-13 (×3): 4 mg via INTRAVENOUS
  Filled 2015-04-09 (×4): qty 2

## 2015-04-09 MED ORDER — DIPHENHYDRAMINE HCL 50 MG/ML IJ SOLN: 12.5000 mg | Freq: Four times a day (QID) | INTRAMUSCULAR | Status: DC | PRN

## 2015-04-09 MED ORDER — MORPHINE SULFATE 2 MG/ML IV SOLN
INTRAVENOUS | Status: DC
  Administered 2015-04-09: 9 mg via INTRAVENOUS
  Administered 2015-04-09: 20.59 mg via INTRAVENOUS
  Administered 2015-04-09: 10:00:00 via INTRAVENOUS
  Administered 2015-04-09: 2 mg via INTRAVENOUS
  Administered 2015-04-10: 4.5 mg via INTRAVENOUS
  Administered 2015-04-10: 15 mg via INTRAVENOUS
  Administered 2015-04-10: 36 mg via INTRAVENOUS
  Administered 2015-04-10: 9 mg via INTRAVENOUS
  Administered 2015-04-10: 7.5 mg via INTRAVENOUS
  Administered 2015-04-10: 10.5 mg via INTRAVENOUS
  Administered 2015-04-10: 06:00:00 via INTRAVENOUS
  Administered 2015-04-11: 6 mg via INTRAVENOUS
  Administered 2015-04-11: 9 mg via INTRAVENOUS
  Administered 2015-04-11: 7 mg via INTRAVENOUS
  Administered 2015-04-11: 2 mg via INTRAVENOUS
  Administered 2015-04-11 – 2015-04-12 (×2): 1.5 mg via INTRAVENOUS
  Administered 2015-04-12: 3 mg via INTRAVENOUS
  Administered 2015-04-12: 1 mg via INTRAVENOUS
  Administered 2015-04-12: 4 mg via INTRAVENOUS
  Administered 2015-04-12: 3 mg via INTRAVENOUS
  Administered 2015-04-13: 1.9 mg via INTRAVENOUS
  Administered 2015-04-13: 1.5 mg via INTRAVENOUS
  Administered 2015-04-13: 3 mg via INTRAVENOUS
  Filled 2015-04-09 (×4): qty 25

## 2015-04-09 MED ORDER — LORAZEPAM 2 MG/ML IJ SOLN
INTRAMUSCULAR | Status: AC
  Filled 2015-04-09: qty 1

## 2015-04-09 MED ORDER — NALOXONE HCL 0.4 MG/ML IJ SOLN: 0.4000 mg | INTRAMUSCULAR | Status: DC | PRN

## 2015-04-09 MED ORDER — FUROSEMIDE 10 MG/ML IJ SOLN
40.0000 mg | Freq: Once | INTRAMUSCULAR | Status: AC
  Administered 2015-04-09: 40 mg via INTRAVENOUS
  Filled 2015-04-09: qty 4

## 2015-04-09 MED ORDER — SODIUM CHLORIDE 0.9% FLUSH: 9.0000 mL | INTRAVENOUS | Status: DC | PRN

## 2015-04-09 MED ORDER — TRACE MINERALS CR-CU-MN-SE-ZN 10-1000-500-60 MCG/ML IV SOLN
INTRAVENOUS | Status: AC
  Administered 2015-04-09: 17:00:00 via INTRAVENOUS
  Filled 2015-04-09: qty 2640

## 2015-04-09 MED ORDER — FAT EMULSION 20 % IV EMUL
240.0000 mL | INTRAVENOUS | Status: AC
  Administered 2015-04-09: 240 mL via INTRAVENOUS
  Filled 2015-04-09: qty 250

## 2015-04-09 NOTE — Progress Notes (Signed)
PARENTERAL NUTRITION CONSULT NOTE - FOLLOW UP  Pharmacy Consult:  TPN Indication:  Open abdomen  Allergies  Allergen Reactions  . Propoxyphene Other (See Comments)    Unspecified    Patient Measurements: Height:  (160 cm) Weight: 274 lb (124.286 kg) IBW/kg (Calculated) : 52.4  BMI = 59.7  Vital Signs: Temp: 100.3 F (37.9 C) (03/02 0400) Temp Source: Oral (03/02 0400) BP: 106/70 mmHg (03/02 0700) Pulse Rate: 84 (03/02 0700) Intake/Output from previous day: 03/01 0701 - 03/02 0700 In: 5314.2 [I.V.:1819.2; Blood:685; NG/GT:90; IV Piggyback:100; TPN:2620] Out: 2880 [Urine:2000; Emesis/NG output:550; Drains:290; Blood:40]  Labs:  Recent Labs  04/08/15 0415 04/08/15 1923 04/09/15 0500  WBC 21.5* 19.1* 22.8*  HGB 7.7* 10.6* 9.9*  HCT 25.0* 32.4* 31.1*  PLT 540* 529* 621*     Recent Labs  04/07/15 1520  04/08/15 0415 04/08/15 1041 04/08/15 1923 04/09/15 0500  NA 134*  --  136  --  134* 135  K 3.8  < > 3.8 3.9 4.3 4.8  CL 95*  --  95*  --  96* 97*  CO2 31  --  31  --  27 28  GLUCOSE 163*  --  172*  --  184* 197*  BUN 19  --  16  --  22* 20  CREATININE 0.83  --  0.76  --  0.71 0.82  CALCIUM 8.3*  --  8.5*  --  8.5* 8.5*  MG  --   --  2.1  --   --  2.1  PHOS 3.2  --   --   --   --  3.5  PROT  --   --   --   --  6.3* 6.3*  ALBUMIN  --   --   --   --  1.6* 1.5*  AST  --   --   --   --  108* 70*  ALT  --   --   --   --  135* 116*  ALKPHOS  --   --   --   --  134* 122  BILITOT  --   --   --   --  6.1* 4.9*  TRIG  --   --  240*  --   --   --   < > = values in this interval not displayed. Estimated Creatinine Clearance: 111.1 mL/min (by C-G formula based on Cr of 0.82).    Recent Labs  04/08/15 1919 04/08/15 2348 04/09/15 0405  GLUCAP 152* 134* 177*     Insulin Requirements in the past 24 hours:  11 units SSI  Current Nutrition:  Clinimix E 5/15 at goal rate of 110 ml/hr and start 20% lipid at 10 ml/hr on Tues and Thurs only to minimize kCal  provision.  TPN will provide a weekly average of 2011 kCal and 32gm protein per day, exceeding kCal goal by 6% and meeting 100% of protein need  Nutritional Goals: (per RD assessment on 2/28) 4098-1191 kCal, 130-140 grams of protein per day  Assessment: 44 YOF admitted on 03/27/15 with back and leg pain and underwent L5 laminectomy with facetectomy later that day.  Intraoperatively patient went into shock and CT revealed large left RP hematoma.  Patient returned to the OR for ex-lap with repair of left iliac vein, ligation of distal left CIA, left CIA-CFA bypass, and placement of abdominal VAC.  Pharmacy consulted to manage TPN for nutritional support.  GI: hx GERD / obesity.  Baseline prealbumin low  at 8.4 >> 9.7.  Frequent OR visits for abd washout (2/19, 2/21, 2/27) - too edematous for abd closure on 2/23, unable to close fascia over intestines on 2/27, s/p abdominal wall closure with mesh on 3/1. Will monitor to ensure stable before considering cycle of TPN on 3/3. NG/emesis O/P - planning to d/c NGT today, drain O/P 320 mL.  PPI IV, PO docusate, Reglan   Endo: no hx DM - hypoglycemic prior to TPN initiation, CBGs 133-166 Lytes: K 4.8, Phos 3.5, Mg 2.1, Na 135 - no replacement needed today Renal: SCr stable, CrCL > 100 ml/min - good UOP 1.4 ml/kg/hr, NS at 10 ml/hr Pulm: intubated, FiO2 40%, extubated 3/2 Cards: angina - BP soft, HR controlled - Lasix IV (dose increased) AC: Heparin for new left femoral DVT (dx 03/29/15) - hgb 7.7, plts elevated at 540 Hepatobil: AST/ALT 70/116 << 108/135, tbili increased 4.9 (no jaundice).  TG 159>>240 - watch Neuro: Propofol >> Precedex, Fentanyl gtt - GCS 13-15, CPOT 0 ID: Tmax/24h: 100.3, WBC 22.8 << 19.1, off antibiotics Best Practices: heparin gtt, MC  TPN Access: right IJ placed 03/27/15 TPN start date: 2/21 >>   Plan:  - Continue Clinimix E 5/15 at goal rate of 110 ml/hr and continue 20% lipid at 10 ml/hr on Tues and Thurs only to minimize  kCal provision.  TPN will provide a weekly average of 2011 kCal and 132gm protein per day, exceeding kCal goal by 6% and meeting 100% of protein need. - Daily multivitamin and trace elements, watch tbili and monitor for jaundice - Continue moderate SSI Q4H and add 10 units regular insulin to TPN given plan to cycle TPN and expect glucose to be elevated during peak infusion. - If still remains stable on 3/3 - consider cycling at that time  Georgina Pillion, PharmD, BCPS Clinical Pharmacist Pager: [phone] 04/09/2015 8:51 AM

## 2015-04-09 NOTE — Progress Notes (Signed)
ANTICOAGULATION CONSULT NOTE - Follow Up Consult  Pharmacy Consult for Heparin Indication: DVT   Allergies  Allergen Reactions  . Propoxyphene Other (See Comments)    Unspecified    Patient Measurements: Height:  (160 cm) Weight: 274 lb (124.286 kg) IBW/kg (Calculated) : 52.4 Heparin Dosing Weight: 84.5 kg  Vital Signs: Temp: 99.4 F (37.4 C) (03/02 1100) Temp Source: Oral (03/02 1100) BP: 96/80 mmHg (03/02 1000) Pulse Rate: 101 (03/02 1000)  Labs:  Recent Labs  04/08/15 0415 04/08/15 1923 04/08/15 1924 04/09/15 0500 04/09/15 0520  HGB 7.7* 10.6*  --  9.9*  --   HCT 25.0* 32.4*  --  31.1*  --   PLT 540* 529*  --  621*  --   HEPARINUNFRC 0.59  --  <0.10*  --  0.52  CREATININE 0.76 0.71  --  0.82  --     Estimated Creatinine Clearance: 111.1 mL/min (by C-G formula based on Cr of 0.82).   Assessment: AC: Heparin for DVT intra-op. Not candidate for pharmacomechanical thrombectomy with injury in L CIV. POD #1 s/p abd wall closure with mesh on 3/1-  Heparin drip restarted post op 3/1?17 @ 18:03.   HL 0.52, therapeutic level on heparin drip 2300 units/hr.  Hgb improved from 7.7 to 9.9. Surgery PA notes today that drains both have some bloody drainage in them.  Patient extubated.     Goal of Therapy:  Heparin level 0.3-0.7 units/ml Monitor platelets by anticoagulation protocol: Yes   Plan:  Continue heparin at 2300 units/hr- Daily heparin level and CBC. F/u long-term anticoagulation plans after all OR procedures completed.   Thank you for allowing pharmacy to be part of this patients care team. Noah Delaine, RPh Clinical Pharmacist Pager: [phone] 04/09/2015,1:41 PM

## 2015-04-09 NOTE — Progress Notes (Addendum)
   Daily Progress Note  Assessment/Planning: S/p x-lap, Repair L CIV, Ligation distal L CIA, L CIA to CFA bypass, s/p multiple abd vac changes x 4 S/p Abd closure with mesh   Abd closed yesterday with assistance of mesh.  Appreciate Gen Surgery's help.  Hopefully can extubate today.  Ok to D/C NGT also  ABI BLE  Continue dry dressing on left groin.  Normally I only keep staples in for 10-14 days.  Given this patient's extreme obesity, I would probably keep it in another week, i.e. D/c staples on 3/10//16.  Probably will reinforce skin with Dermabond after staple removal.  Start Coumadin tomorrow if H/H ok and JP drain output ok   Subjective   Abd closed yesterday  Objective Filed Vitals:   04/09/15 0500 04/09/15 0600 04/09/15 0700 04/09/15 0803  BP: 111/54 99/69 106/70 106/70  Pulse: 99 85 84 89  Temp:    98.7 F (37.1 C)  TempSrc:    Oral  Resp: Height:      Weight: 274 lb (124.286 kg)     SpO2: 100% 100% 100% 100%    Intake/Output Summary (Last 24 hours) at 04/09/15 0839 Last data filed at 04/09/15 0700  Gross per 24 hour  Intake 5114.2 ml  Output   2830 ml  Net 2284.2 ml    VASC  L groin c/d/i, staples in place, no drainage; warm left foot with dopplerable signals  Laboratory CBC    Component Value Date/Time   WBC 22.8* 04/09/2015 0500   HGB 9.9* 04/09/2015 0500   HCT 31.1* 04/09/2015 0500   PLT 621* 04/09/2015 0500    BMET    Component Value Date/Time   NA 135 04/09/2015 0500   K 4.8 04/09/2015 0500   CL 97* 04/09/2015 0500   CO2 28 04/09/2015 0500   GLUCOSE 197* 04/09/2015 0500   BUN 20 04/09/2015 0500   CREATININE 0.82 04/09/2015 0500   CALCIUM 8.5* 04/09/2015 0500   GFRNONAA >60 04/09/2015 0500   GFRAA >60 04/09/2015 0500    Leonides Sake, MD [facility] Office: [phone] Pager: [phone]  04/09/2015, 8:39 AM

## 2015-04-09 NOTE — Progress Notes (Signed)
   VASCULAR SURGERY ASSESSMENT & PLAN:  * Called to see patient because of tachycardia. She is POD 1 s/p abdominal wall closure with mesh. She does describe some mild chest pain. EKG does not suggest ischemia. HGB is stable at 9.5, so no evidence of bleeding. Electrolytes are pending. Abdomen does not feel especially tense. Urine output is good. Not febrile. CCM following.   SUBJECTIVE: Alert. Moderate abdominal discomfort.   PHYSICAL EXAM: Filed Vitals:   04/09/15 1600 04/09/15 1700 04/09/15 1739 04/09/15 1800  BP: 139/115 127/58  134/66  Pulse: 114 121  126  Temp:      TempSrc:      Resp: Height:      Weight:      SpO2: 100% 100% 100% 100%   Abdomen does not feel especially tense. Good air exchange.   EKG: Sinus tachycardia.   LABS: Lab Results  Component Value Date   WBC 27.9* 04/09/2015   HGB 9.5* 04/09/2015   HCT 28.9* 04/09/2015   MCV 88.1 04/09/2015   PLT 642* 04/09/2015   Lab Results  Component Value Date   CREATININE 0.82 04/09/2015   Lab Results  Component Value Date   INR 1.20 04/02/2015   CBG (last 3)   Recent Labs  04/09/15 0405 04/09/15 0829 04/09/15 1203  GLUCAP 177* 173* 192*    Active Problems:   Lumbar spondylolysis   Acute respiratory failure with hypoxia (HCC)   Hemorrhagic shock   Hypervolemia   Acute respiratory failure with hypercapnia (HCC)   Fever, unspecified   Pressure ulcer   Cari Caraway Beeper: [phone] 04/09/2015

## 2015-04-09 NOTE — Progress Notes (Signed)
1 Day Post-Op  Subjective: Extubated, and trying to talk.  Asking to get OOB and sit up.  Midline looks good. Dressing looks fine some drainage.  Drains both have some bloody drainage in them.    Objective: Vital signs in last 24 hours: Temp:  [97.6 F (36.4 C)-100.3 F (37.9 C)] 98.7 F (37.1 C) (03/02 0803) Pulse Rate:  [77-108] 101 (03/02 1000) Resp:  [18-31] 23 (03/02 1000) BP: (96-140)/(30-104) 96/80 mmHg (03/02 1000) SpO2:  [99 %-100 %] 100 % (03/02 1000) FiO2 (%):  [40 %] 40 % (03/02 0803) Weight:  [124.286 kg (274 lb)] 124.286 kg (274 lb) (03/02 0500) Last BM Date: 04/07/15 1819 IV Urine 2000 550 emesis Drain 290 TM 100.3 early AM CMP shows improvement of the LFT'S, WBC still up,  H/H is stable, therapeutic on heparin   Intake/Output from previous day: 03/01 0701 - 03/02 0700 In: 5314.2 [I.V.:1819.2; Blood:685; NG/GT:90; IV Piggyback:100; TPN:2620] Out: 2880 [Urine:2000; Emesis/NG output:550; Drains:290; Blood:40] Intake/Output this shift: Total I/O In: 479 [I.V.:149; TPN:330] Out: 390 [Urine:350; Drains:40]  General appearance: alert, cooperative, no distress and extubated and trying to talk today. GI: soft, very sore, NO BS, sites OK, drainage is bloody this AM.  Lab Results:   Recent Labs  04/08/15 1923 04/09/15 0500  WBC 19.1* 22.8*  HGB 10.6* 9.9*  HCT 32.4* 31.1*  PLT 529* 621*    BMET  Recent Labs  04/08/15 1923 04/09/15 0500  NA 134* 135  K 4.3 4.8  CL 96* 97*  CO2 27 28  GLUCOSE 184* 197*  BUN 22* 20  CREATININE 0.71 0.82  CALCIUM 8.5* 8.5*   PT/INR No results for input(s): LABPROT, INR in the last 72 hours.   Recent Labs Lab 04/06/15 0503 04/08/15 1923 04/09/15 0500  AST 67* 108* 70*  ALT 76* 135* 116*  ALKPHOS 78 134* 122  BILITOT 4.8* 6.1* 4.9*  PROT 5.8* 6.3* 6.3*  ALBUMIN 1.5* 1.6* 1.5*     Lipase  No results found for: LIPASE   Studies/Results: Dg Chest Port 1 View  04/09/2015  CLINICAL DATA:  Follow-up  respiratory failure. History of exploratory laparotomy. EXAM: PORTABLE CHEST 1 VIEW COMPARISON:  Chest x-rays dated 04/08/2015 in 03/2026 2017. FINDINGS: Endotracheal tube remains well positioned with tip just above the level of the carina. Right-sided PICC line is stable in position. Nasogastric tube passes below the diaphragm. Slightly low lung volumes. Heart size is upper normal, likely accentuated by the low lung volumes. Given the low lung volumes, lungs appear clear. No evidence of pneumonia. No pleural effusions seen. No pneumothorax. IMPRESSION: Stable chest x-ray, again with low lung volumes. No acute findings. Tubes and lines remain well-positioned. Electronically Signed   By: Bary Richard M.D.   On: 04/09/2015 07:54   Dg Chest Port 1 View  04/08/2015  CLINICAL DATA:  Acute respiratory failure EXAM: PORTABLE CHEST 1 VIEW COMPARISON:  04/07/2015 FINDINGS: Cardiac shadow is at the upper limits of normal in size but accentuated by the portable technique. An endotracheal tube, nasogastric catheter and right PICC line are again seen and stable. The overall inspiratory effort is poor although no focal confluent infiltrate is seen. No bony abnormality is noted. IMPRESSION: Tubes and lines as described above. Poor inspiratory effort Electronically Signed   By: Alcide Clever M.D.   On: 04/08/2015 07:50    Medications: . antiseptic oral rinse  7 mL Mouth Rinse QID  . chlorhexidine gluconate  15 mL Mouth Rinse BID  . docusate  100 mg Oral Daily  . insulin aspart  0-15 Units Subcutaneous 6 times per day  . metoCLOPramide  10 mg Oral 4 times per day  . morphine   Intravenous 6 times per day  . pantoprazole (PROTONIX) IV  40 mg Intravenous QHS  . sodium chloride flush  10-40 mL Intracatheter Q12H   . Marland KitchenTPN (CLINIMIX-E) Adult 110 mL/hr at 04/09/15 0900  . sodium chloride 10 mL/hr at 04/09/15 0400  . dexmedetomidine Stopped (04/09/15 0800)  . Marland KitchenTPN (CLINIMIX-E) Adult     And  . fat emulsion    . heparin  2,300 Units/hr (04/09/15 1000)   Assessment/Plan Open Abdomen S/p EXPLORATORY LAPAROTOMY/ABD. WALL CLOSURE WITH MESH, 04/08/15, Dr, Axel Filler Back pain with bleeding injury during surgery, surgery aborted. S/p Exploratory laparotomy control of bleeding and left common iliac to left common femoral bypass with 8 mm Hemashield graft. -DR Early, and Left common iliac artery to left common femoral artery bypass with 8 mm Dacron, and placement of abdominal negative pressure dressing, by Dr. Imogene Burn at that time also. On 03/29/15 Dr,. [facility] out Dr. Imogene Burn 03/29/15. Hx of angina/dysrythmia GERD Obesity, Body mass index is 59.5 Arthritis Hx of asthma Hypokalemia] Fluid overload  Antibiotics: Pre op only DVT: Heparin drip   Plan:  Will check with Dr. Derrell Lolling about getting up.  They are also going to check with neurosurgery also.     LOS: 13 days    Tycen Dockter 04/09/2015

## 2015-04-09 NOTE — Anesthesia Postprocedure Evaluation (Signed)
Anesthesia Post Note  Patient: Kristina Estes  Procedure(s) Performed: Procedure(s) (LRB): EXPLORATORY LAPAROTOMY/ABD. WALL CLOSURE (N/A)  Patient location during evaluation: ICU Anesthesia Type: General Level of consciousness: sedated and patient remains intubated per anesthesia plan Pain management: pain level controlled Respiratory status: patient remains intubated per anesthesia plan Cardiovascular status: stable Anesthetic complications: no    Last Vitals:  Filed Vitals:   04/09/15 0803 04/09/15 0935  BP: 106/70   Pulse: 89   Temp: 37.1 C   Resp: 26 19    Last Pain:  Filed Vitals:   04/09/15 0940  PainSc: 8                  Jonny Dearden S

## 2015-04-09 NOTE — Progress Notes (Addendum)
Nutrition Follow Up  DOCUMENTATION CODES:   Morbid obesity  INTERVENTION:    TPN per pharmacy  NUTRITION DIAGNOSIS:   Inadequate oral intake related to inability to eat as evidenced by NPO status, ongoing  GOAL:   Patient will meet greater than or equal to 90% of their needs, met  MONITOR:   Diet advancement, Labs, Weight trends, Skin, I & O's, TPN prescription   ASSESSMENT:   45 year old with history of asthma, arthritis, obesity. She had an elective admission today for L5 laminectomy. Intra-Op she went into shock, hemodynamic instability. Subsequent CT showed large left retroperitoneal hematoma. She was put on 2 pressors (norepi, epinephrine) and transferred to ICU. PCCM consulted for help with management. Vascular surgery was also consulted. Intra OP TEE showed normal cardiac function.  Patient s/p procedure 2/23 & 2/27: ABDOMINAL NEGATIVE PRESSURE DRESSING CHANGE  Patient s/p procedure 3/1: EXPLORATORY LAPAROTOMY ABDOMINAL WALL CLOSURE WITH MESH  Patient extubated this AM.  NGT discontinued.  Patient is receiving TPN via CVC with Clinimix E 5/15 @ 110 ml/hr and 20% lipids @ 10 ml/hr on Tuesdays and Thursdays only.  TPN provides a weekly average of 2011 kcals, 132 gm protein per day.  Meets 100% minimum re-estimated energy needs and 100% minimum estimated protein needs.  Diet Order:  Diet NPO time specified .TPN (CLINIMIX-E) Adult TPN (CLINIMIX-E) Adult  Skin:  Wound (see comment) (abdominal wound VAC)  Last BM:  2/28  Height:   Ht Readings from Last 1 Encounters:  03/27/15 _0  (1.6 m)    Weight:   Wt Readings from Last 1 Encounters:  04/09/15 274 lb (124.286 kg)    Ideal Body Weight:  52.27 kg (kg)  BMI:  Body mass index is 48.55 kg/(m^2).  Estimated Nutritional Needs:   Kcal:  2000-2200  Protein:  130-140 gm  Fluid:  per MD  EDUCATION NEEDS:   No education needs identified at this time  Arthur Holms, RD, LDN Pager #:  [phone] After-Hours Pager #: [phone]

## 2015-04-09 NOTE — Procedures (Signed)
Extubation Procedure Note  Patient Details:   Name: Kristina Estes DOB: 03/31/1970 MRN: [id number]   Airway Documentation:   Patient had audible cuff leak prior to extubation. Patient extubated to [facility] with no complications. BS are equal, no stridor present.   Evaluation  O2 sats: stable throughout Complications: No apparent complications Patient did tolerate procedure well. Bilateral Breath Sounds: Clear, Diminished Suctioning: Airway Yes  Libby Maw 04/09/2015, 8:23 AM

## 2015-04-09 NOTE — Progress Notes (Signed)
Saw pt this afternoon, extubated, talking. Did admit to some mild abdominal discomfort. No real back pain. Awake, alert, responding appropriately. MAE well. Will need to determine when she would be able to be prone to complete instrumenting her lumbar spine for stability. Until then, can sit up in bed, but would not get OOB.

## 2015-04-09 NOTE — Progress Notes (Signed)
1 Day Post-Op  Subjective: Pt doing well this AM with no acute changes overnight.  Objective: Vital signs in last 24 hours: Temp:  [97.6 F (36.4 C)-100.3 F (37.9 C)] 100.3 F (37.9 C) (03/02 0400) Pulse Rate:  [77-108] 84 (03/02 0700) Resp:  [18-31] 20 (03/02 0700) BP: (89-140)/(30-104) 106/70 mmHg (03/02 0700) SpO2:  [98 %-100 %] 100 % (03/02 0700) FiO2 (%):  [40 %] 40 % (03/02 0400) Weight:  [124.286 kg (274 lb)] 124.286 kg (274 lb) (03/02 0500) Last BM Date: 04/07/15  Intake/Output from previous day: 03/01 0701 - 03/02 0700 In: 5314.2 [I.V.:1819.2; Blood:685; NG/GT:90; IV Piggyback:100; TPN:2620] Out: 2880 [Urine:2000; Emesis/NG output:550; Drains:290; Blood:40] Intake/Output this shift:    General appearance: alert and cooperative GI: soft, approp ttp, ND, incision looks good  Lab Results:   Recent Labs  04/08/15 1923 04/09/15 0500  WBC 19.1* 22.8*  HGB 10.6* 9.9*  HCT 32.4* 31.1*  PLT 529* 621*   BMET  Recent Labs  04/08/15 1923 04/09/15 0500  NA 134* 135  K 4.3 4.8  CL 96* 97*  CO2 27 28  GLUCOSE 184* 197*  BUN 22* 20  CREATININE 0.71 0.82  CALCIUM 8.5* 8.5*   PT/INR No results for input(s): LABPROT, INR in the last 72 hours. ABG No results for input(s): PHART, HCO3 in the last 72 hours.  Invalid input(s): PCO2, PO2  Studies/Results: Dg Chest Port 1 View  04/08/2015  CLINICAL DATA:  Acute respiratory failure EXAM: PORTABLE CHEST 1 VIEW COMPARISON:  04/07/2015 FINDINGS: Cardiac shadow is at the upper limits of normal in size but accentuated by the portable technique. An endotracheal tube, nasogastric catheter and right PICC line are again seen and stable. The overall inspiratory effort is poor although no focal confluent infiltrate is seen. No bony abnormality is noted. IMPRESSION: Tubes and lines as described above. Poor inspiratory effort Electronically Signed   By: Alcide Clever M.D.   On: 04/08/2015 07:50    Anti-infectives: Anti-infectives     Start     Dose/Rate Route Frequency Ordered Stop   04/02/15 2330  vancomycin (VANCOCIN) IVPB 1000 mg/200 mL premix  Status:  Discontinued     1,000 mg 200 mL/hr over 60 Minutes Intravenous Every 12 hours 04/02/15 1015 04/05/15 0849   04/02/15 1830  piperacillin-tazobactam (ZOSYN) IVPB 3.375 g     3.375 g 12.5 mL/hr over 240 Minutes Intravenous 3 times per day 04/02/15 1015     04/02/15 1100  cefUROXime (ZINACEF) 1.5 g in dextrose 5 % 50 mL IVPB  Status:  Discontinued     1.5 g 100 mL/hr over 30 Minutes Intravenous To ShortStay Surgical 04/01/15 1106 04/02/15 1448   04/02/15 1100  vancomycin (VANCOCIN) 2,500 mg in sodium chloride 0.9 % 500 mL IVPB     2,500 mg 250 mL/hr over 120 Minutes Intravenous  Once 04/02/15 1029 04/02/15 1342   04/02/15 1030  vancomycin (VANCOCIN) 2,250 mg in sodium chloride 0.9 % 500 mL IVPB  Status:  Discontinued     2,250 mg 250 mL/hr over 120 Minutes Intravenous  Once 04/02/15 1015 04/02/15 1029   04/02/15 1030  piperacillin-tazobactam (ZOSYN) IVPB 3.375 g     3.375 g 100 mL/hr over 30 Minutes Intravenous  Once 04/02/15 1015 04/02/15 1212   03/27/15 2130  cefUROXime (ZINACEF) 1.5 g in dextrose 5 % 50 mL IVPB  Status:  Discontinued     1.5 g 100 mL/hr over 30 Minutes Intravenous Every 12 hours 03/27/15 2124 03/27/15 2132  03/27/15 1600  ceFAZolin (ANCEF) IVPB 2 g/50 mL premix     2 g 100 mL/hr over 30 Minutes Intravenous Every 8 hours 03/27/15 1322 03/28/15 0759   03/27/15 1447  ceFAZolin (ANCEF) 1-5 GM-% IVPB    Comments:  Merril Abbe   : cabinet override      03/27/15 1447 03/28/15 0259   03/27/15 0800  bacitracin 50,000 Units in sodium chloride irrigation 0.9 % 500 mL irrigation  Status:  Discontinued       As needed 03/27/15 0943 03/27/15 1223   03/27/15 0700  ceFAZolin (ANCEF) 3 g in dextrose 5 % 50 mL IVPB     3 g 130 mL/hr over 30 Minutes Intravenous To ShortStay Surgical 03/26/15 1409 03/27/15 0750      Assessment/Plan: s/p  Procedure(s): EXPLORATORY LAPAROTOMY/ABD. WALL CLOSURE (N/A) -Con't abd binder -Con't to monitor skin, Drains -following  LOS: 13 days    Marigene Ehlers., [facility] 04/09/2015

## 2015-04-09 NOTE — Progress Notes (Signed)
PULMONARY / CRITICAL CARE MEDICINE   Name: Kristina Estes MRN: [id number] DOB: 1971/02/04    ADMISSION DATE:  03/27/2015  CONSULTATION DATE: 03/27/15  REFERRING MD: Conchita Paris  CHIEF COMPLAINT: Hemorrhagic shock  SUBJECTIVE:  Tolerating some pressure support.   VITAL SIGNS: BP 106/70 mmHg  Pulse 84  Temp(Src) 100.3 F (37.9 C) (Oral)  Resp 20  Ht  (1.6 m)  Wt 274 lb (124.286 kg)  BMI 48.55 kg/m2  SpO2 100%  LMP 03/27/2015  VENTILATOR SETTINGS: Vent Mode:  [-] PRVC FiO2 (%):  [40 %] 40 % Set Rate:  [20 bmp] 20 bmp Vt Set:  [450 mL] 450 mL PEEP:  [5 cmH20] 5 cmH20 Pressure Support:  [15 cmH20] 15 cmH20 Plateau Pressure:  [18 cmH20-26 cmH20] 26 cmH20  INTAKE / OUTPUT: I/O last 3 completed shifts: In: 7850.9 [I.V.:2625.9; Blood:685; NG/GT:220; IV Piggyback:150] Out: 5655 [Urine:3925; Emesis/NG output:1150; Drains:540; Blood:40]   PHYSICAL EXAMINATION: General: RASS 0 Neuro: follows commands HEENT: ETT in place Cardiac: regular, no murmur Chest: no wheeze Abd: abd binder in place Ext: 1+ edema Skin: no rashes   LABS:  BMET  Recent Labs Lab 04/08/15 0415 04/08/15 1041 04/08/15 1923 04/09/15 0500  NA 136  --  134* 135  K 3.8 3.9 4.3 4.8  CL 95*  --  96* 97*  CO2 31  --  27 28  BUN 16  --  22* 20  CREATININE 0.76  --  0.71 0.82  GLUCOSE 172*  --  184* 197*    Electrolytes  Recent Labs Lab 04/06/15 0503 04/07/15 1520 04/08/15 0415 04/08/15 1923 04/09/15 0500  CALCIUM 8.0* 8.3* 8.5* 8.5* 8.5*  MG 2.0  --  2.1  --  2.1  PHOS 3.1 3.2  --   --  3.5    CBC  Recent Labs Lab 04/08/15 0415 04/08/15 1923 04/09/15 0500  WBC 21.5* 19.1* 22.8*  HGB 7.7* 10.6* 9.9*  HCT 25.0* 32.4* 31.1*  PLT 540* 529* 621*    Sepsis Markers  Recent Labs Lab 04/02/15 1100  LATICACIDVEN 0.8    Liver Enzymes  Recent Labs Lab 04/06/15 0503 04/08/15 1923 04/09/15 0500  AST 67* 108* 70*  ALT 76* 135* 116*  ALKPHOS 78 134* 122  BILITOT 4.8*  6.1* 4.9*  ALBUMIN 1.5* 1.6* 1.5*   Glucose  Recent Labs Lab 04/08/15 0832 04/08/15 1216 04/08/15 1734 04/08/15 1919 04/08/15 2348 04/09/15 0405  GLUCAP 140* 133* 166* 152* 134* 177*    Imaging No results found.    STUDIES:  2/17 CT abd/pelvis >>Lt sided retroperitoneal hematoma 2/19 Doppler b/l legs >> DVT Lt common femoral vein  CULTURES: 2/23 Blood >> negative 2/24 Sputum >> Few candida  ANTIBIOTICS: 2/23 Vancomycin >> 2/26 2/23 Zosyn >> 3/02  SIGNIFICANT EVENTS: 2/17 L5 laminectomy >> retroperitoneal hemorrhage; vascular surgery consulted >> Lt common iliac to Lt common femoral bypass 2/19 abdominal washout, wound vac; DVT Lt leg >> start heparin gtt 2/20 General surgery consulted 2/21 To OR 2/23 To OR 2/27 To OR 3/01 To OR >> abdominal wall closure with mesh  LINES/TUBES: 2/17 IJ CVL >> 2/25 2/17 ETT >> 2/25 Rt PICC >>   DISCUSSION: 45 yo female with back and leg pain from L5-S1 spondylolisthesis.  She had L5 laminectomy on 2/17 >> c/b retroperitoneal hemorrhage.  She required laparotomy with Lt common iliac to common femoral bypass.  Her abdomen has remained open due to edema, and she has required repeated trips to the OR.  She remains on  the vent for OR trips.  She has hx of Asthma, GERD.  ASSESSMENT / PLAN:  PULMONARY A: Acute respiratory failure in setting of retroperitoneal hemorrhage. P:  Pressure support wean as tolerated >> might be ready for extubation soon F/u CXR  CARDIOVASCULAR A: Hemorrhagic shock 2nd to retroperitoneal bleeding >> resolved. Lt leg DVT noted 2/19. P:  Continue heparin gtt >> will eventually need to transition to DOAC Monitor hemodynamics  RENAL A:  Hypervolemia >> improved. Hypokalemia. P:  Lasix 40 mg IV x one 3/02 Replace electrolytes as needed  GASTROINTESTINAL A:  S/p laparotomy with difficulty closing abdominal wound due to edema. Nutrition. P: Post op care per surgery TNA, reglan per  surgery Protonix for SUP  HEMATOLOGIC A:  Anemia 2nd to acute blood loss, and critical illness. P:  F/u CBC intermittently Transfuse for Hb < 7  INFECTIOUS A:  HCAP. P:  D/c zosyn 3/02  ENDOCRINE A:  Hyperglycemia. P:  SSI  NEUROLOGIC A:  L5-S1 spondylolisthesis. Sedation. P:  RASS goal 0 to -1   CC time 31 minutes.  Coralyn Helling, MD [facility] Pulmonary/Critical Care 04/09/2015, 7:50 AM Pager:  [phone] After 3pm call: [phone]

## 2015-04-10 ENCOUNTER — Inpatient Hospital Stay (HOSPITAL_COMMUNITY): Payer: PRIVATE HEALTH INSURANCE

## 2015-04-10 DIAGNOSIS — E8770 Fluid overload, unspecified: Secondary | ICD-10-CM

## 2015-04-10 DIAGNOSIS — S35513A Injury of unspecified iliac artery, initial encounter: Secondary | ICD-10-CM

## 2015-04-10 LAB — BASIC METABOLIC PANEL
Anion gap: 8 (ref 5–15)
BUN: 19 mg/dL (ref 6–20)
CALCIUM: 8.3 mg/dL — AB (ref 8.9–10.3)
CO2: 28 mmol/L (ref 22–32)
CREATININE: 0.77 mg/dL (ref 0.44–1.00)
Chloride: 98 mmol/L — ABNORMAL LOW (ref 101–111)
GFR calc non Af Amer: >60 mL/min (ref >60)
Glucose, Bld: 200 mg/dL — ABNORMAL HIGH (ref 65–99)
Potassium: 4.4 mmol/L (ref 3.5–5.1)
SODIUM: 134 mmol/L — AB (ref 135–145)

## 2015-04-10 LAB — GLUCOSE, CAPILLARY
GLUCOSE-CAPILLARY: 158 mg/dL — AB (ref 65–99)
GLUCOSE-CAPILLARY: 160 mg/dL — AB (ref 65–99)
GLUCOSE-CAPILLARY: 165 mg/dL — AB (ref 65–99)
GLUCOSE-CAPILLARY: 169 mg/dL — AB (ref 65–99)
GLUCOSE-CAPILLARY: 172 mg/dL — AB (ref 65–99)
GLUCOSE-CAPILLARY: 186 mg/dL — AB (ref 65–99)

## 2015-04-10 LAB — HEPARIN LEVEL (UNFRACTIONATED): Heparin Unfractionated: 0.72 IU/mL — ABNORMAL HIGH (ref 0.30–0.70)

## 2015-04-10 MED ORDER — TRACE MINERALS CR-CU-MN-SE-ZN 10-1000-500-60 MCG/ML IV SOLN
INTRAVENOUS | Status: AC
  Administered 2015-04-10: 18:00:00 via INTRAVENOUS
  Filled 2015-04-10 (×2): qty 2640

## 2015-04-10 MED ORDER — FUROSEMIDE 10 MG/ML IJ SOLN
40.0000 mg | Freq: Once | INTRAMUSCULAR | Status: AC
  Administered 2015-04-10: 40 mg via INTRAVENOUS
  Filled 2015-04-10: qty 4

## 2015-04-10 MED ORDER — HEPARIN (PORCINE) IN NACL 100-0.45 UNIT/ML-% IJ SOLN
2250.0000 [IU]/h | INTRAMUSCULAR | Status: DC
  Administered 2015-04-10 – 2015-04-13 (×6): 2250 [IU]/h via INTRAVENOUS
  Filled 2015-04-10 (×9): qty 250

## 2015-04-10 MED ORDER — INSULIN ASPART 100 UNIT/ML ~~LOC~~ SOLN
0.0000 [IU] | SUBCUTANEOUS | Status: DC
  Administered 2015-04-10 – 2015-04-11 (×4): 3 [IU] via SUBCUTANEOUS

## 2015-04-10 NOTE — Progress Notes (Signed)
PARENTERAL NUTRITION CONSULT NOTE - FOLLOW UP  Pharmacy Consult:  TPN Indication:  Open abdomen  Allergies  Allergen Reactions  . Propoxyphene Other (See Comments)    Unspecified    Patient Measurements: Height:  (160 cm) Weight: 274 lb (124.286 kg) IBW/kg (Calculated) : 52.4  BMI = 59.7  Vital Signs: Temp: 98.6 F (37 C) (03/03 0400) Temp Source: Oral (03/03 0400) BP: 140/63 mmHg (03/03 0800) Pulse Rate: 122 (03/03 0800) Intake/Output from previous day: 03/02 0701 - 03/03 0700 In: 3561.6 [I.V.:914.8; NG/GT:30; ZOX:0960.4] Out: 3785 [Urine:2875; Emesis/NG output:800; Drains:110]  Labs:  Recent Labs  04/08/15 1923 04/09/15 0500 04/09/15 1745  WBC 19.1* 22.8* 27.9*  HGB 10.6* 9.9* 9.5*  HCT 32.4* 31.1* 28.9*  PLT 529* 621* 642*     Recent Labs  04/07/15 1520  04/08/15 0415  04/08/15 1923 04/09/15 0500 04/09/15 1745 04/10/15 0420  NA 134*  --  136  --  134* 135 135 134*  K 3.8  < > 3.8  < > 4.3 4.8 4.3 4.4  CL 95*  --  95*  --  96* 97* 97* 98*  CO2 31  --  31  --  GLUCOSE 163*  --  172*  --  184* 197* 176* 200*  BUN 19  --  16  --  22* CREATININE 0.83  --  0.76  --  0.71 0.82 0.81 0.77  CALCIUM 8.3*  --  8.5*  --  8.5* 8.5* 8.5* 8.3*  MG  --   --  2.1  --   --  2.1  --   --   PHOS 3.2  --   --   --   --  3.5  --   --   PROT  --   --   --   --  6.3* 6.3*  --   --   ALBUMIN  --   --   --   --  1.6* 1.5*  --   --   AST  --   --   --   --  108* 70*  --   --   ALT  --   --   --   --  135* 116*  --   --   ALKPHOS  --   --   --   --  134* 122  --   --   BILITOT  --   --   --   --  6.1* 4.9*  --   --   TRIG  --   --  240*  --   --   --   --   --   < > = values in this interval not displayed. Estimated Creatinine Clearance: 113.8 mL/min (by C-G formula based on Cr of 0.77).    Recent Labs  04/09/15 1933 04/09/15 2354 04/10/15 0356  GLUCAP 173* 172* 186*     Insulin Requirements in the past 24 hours:  9 units  SSI  Current Nutrition:  Clinimix E 5/15 at goal rate of 110 ml/hr and start 20% lipid at 10 ml/hr on Tues and Thurs only to minimize kCal provision.  TPN will provide a weekly average of 2011 kCal and 32gm protein per day, exceeding kCal goal by 6% and meeting 100% of protein need  Nutritional Goals: (per RD assessment on 2/28) 5409-8119 kCal, 130-140 grams of protein per day  Assessment: 44 YOF admitted on 03/27/15 with back  and leg pain and underwent L5 laminectomy with facetectomy later that day.  Intraoperatively patient went into shock and CT revealed large left RP hematoma.  Patient returned to the OR for ex-lap with repair of left iliac vein, ligation of distal left CIA, left CIA-CFA bypass, and placement of abdominal VAC.  Pharmacy consulted to manage TPN for nutritional support.  GI: hx GERD / obesity.  Baseline prealbumin low at 8.4 >> 9.7.  Frequent OR visits for abd washout (2/19, 2/21, 2/27) - too edematous for abd closure on 2/23, unable to close fascia over intestines on 2/27, s/p abdominal wall closure with mesh on 3/1. Will monitor to ensure stable before considering cycle of TPN on 3/3. NG/emesis O/P 800mL, drain O/P 165 mL.  PPI IV, PO docusate, Reglan   Endo: no hx DM - hypoglycemic prior to TPN initiation, CBGs 139-200 Lytes: K 4.4, Phos 3.5, Mg 2.1, Na 134 - no replacement needed today Renal: SCr stable, CrCL > 100 ml/min - good UOP 1 ml/kg/hr, NS at 10 ml/hr Pulm: extubated 3/2 Cards: angina - BP ok, tachy - Lasix IV x1 AC: Heparin for new left femoral DVT (dx 03/29/15) - hgb 7.7, plts elevated at 540 Hepatobil: AST/ALT 70/116 << 108/135, tbili increased 4.9 (RN notes a little jaundice in sclera).  TG 159>>240 - watch Neuro: morphine PCA, pain score 2-7 ID: Tmax/24h: 99.4, WBC 27.9 << 22.8, off antibiotics Best Practices: heparin gtt, MC  TPN Access: right IJ placed 03/27/15 TPN start date: 2/21 >>   Plan:  - Begin to cycle TPN over 18 hrs today - Clinimix E 5/15 50  ml/hr x 1 hr, 159 ml/hr x 16 hr, 50 ml/hr x 1 hr (total volume 2640 ml) - Continue 10 units regular insulin to TPN - Will plan to cycle IVFE 20% on Tues and Thurs if tolerates - TPN  + IVFE will provide a weekly average of 2011 kCal and 132gm protein per day, exceeding kCal goal by 6% and meeting 100% of protein need. - Daily multivitamin and trace elements, watch tbili and monitor for jaundice - Change moderate SSI to custom schedule to monitor cyclic TPN (5a, 1p, 4p, 8p)   Loura BackJennifer Fairlawn, [address] Rainbow BoulevardPharm.D., BCPS Clinical Pharmacist Pager: [phone] 04/10/2015 8:57 AM

## 2015-04-10 NOTE — Progress Notes (Signed)
   Daily Progress Note  Assessment/Planning: S/p x-lap, Repair L CIV, Ligation distal L CIA, L CIA to CFA bypass, s/p multiple abd vac changes x 4 S/p Abd closure with mesh   Briefly discussion with pt in regards events since her spine surgery  KUB to check NGT: if post-pyloric, ok to D/C NGT, otherwise NGT output too much to remove yet  Continue changing left groin dressing BID  Both feet remain perfused with intact motor  Not certain source of leukocytosis, no fevers for days, on IV abx for >7 days  In regards to Heparin drip for DVT.  Ok to switch to Coumadin from my viewpoint, if ok with Gen Surgery  Will continue to check on patient periodically.  Will check back in on her on Monday   Subjective    "want to sit up"  Objective Filed Vitals:   04/10/15 0100 04/10/15 0200 04/10/15 0300 04/10/15 0400  BP: 125/67 135/73 137/69   Pulse: 115 116 116   Temp:    98.6 F (37 C)  TempSrc:    Oral  Resp: 27 29 28 28   Height:      Weight:      SpO2: 100% 100% 100% 100%    Intake/Output Summary (Last 24 hours) at 04/10/15 0536 Last data filed at 04/10/15 0400  Gross per 24 hour  Intake 3636.83 ml  Output   3060 ml  Net 576.83 ml    PULM  BLL rales CV  RR, tachycardiac GI  soft, inc bandaged, JP still in with serosang drainage VASC  L foot warm with faintly palpable PT, PF/DF intact, L groin c/d/i, no drainage on dressing yesterday  Laboratory CBC    Component Value Date/Time   WBC 27.9* 04/09/2015 1745   HGB 9.5* 04/09/2015 1745   HCT 28.9* 04/09/2015 1745   PLT 642* 04/09/2015 1745    BMET    Component Value Date/Time   NA 134* 04/10/2015 0420   K 4.4 04/10/2015 0420   CL 98* 04/10/2015 0420   CO2 28 04/10/2015 0420   GLUCOSE 200* 04/10/2015 0420   BUN 19 04/10/2015 0420   CREATININE 0.77 04/10/2015 0420   CALCIUM 8.3* 04/10/2015 0420   GFRNONAA >60 04/10/2015 0420   GFRAA >60 04/10/2015 0420    Leonides SakeBrian Chen, MD [facility]  [place] Office: [phone] Pager: [phone]  04/10/2015, 5:36 AM

## 2015-04-10 NOTE — Progress Notes (Signed)
VASCULAR LAB PRELIMINARY  PRELIMINARY  PRELIMINARY  PRELIMINARY   VASCULAR LAB PRELIMINARY  ARTERIAL  ABI completed:    RIGHT    LEFT    PRESSURE WAVEFORM  PRESSURE WAVEFORM  BRACHIAL Central line Triphasic BRACHIAL 156 Triphasic  DP 132 Triphasic DP 129 Biphasic  PT 149 Triphasic PT 134 Biphasic    RIGHT LEFT  ABI 0.96 0.86   No pressure in right arm due to central line.  Right ABI is suggestive normal at rest. Left ABI is suggestive mild arterial disease at rest.   Kristina Estes, Kristina Estes, Kristina Estes 04/10/2015, 2:35 PM

## 2015-04-10 NOTE — Progress Notes (Signed)
PULMONARY / CRITICAL CARE MEDICINE   Name: Kristina Estes MRN: [id number] DOB: 1970/06/03    ADMISSION DATE:  03/27/2015  CONSULTATION DATE: 03/27/15  REFERRING MD: Conchita Paris  CHIEF COMPLAINT: Hemorrhagic shock  SUBJECTIVE:  Denies chest pain or dyspnea.  Abdominal pain controlled.  VITAL SIGNS: BP 140/63 mmHg  Pulse 122  Temp(Src) 98.6 F (37 C) (Oral)  Resp 18  Ht  (1.6 m)  Wt 274 lb (124.286 kg)  BMI 48.55 kg/m2  SpO2 100%  LMP 03/27/2015  VENTILATOR SETTINGS: Vent Mode:  [-]  FiO2 (%):  [21 %] 21 %  INTAKE / OUTPUT: I/O last 3 completed shifts: In: 5969.3 [I.V.:1862.5; NG/GT:120; IV Piggyback:50] Out: 5560 [Urine:4000; Emesis/NG output:1200; Drains:360]   PHYSICAL EXAMINATION: General: pleasant Neuro: follows commands HEENT: no stridor Cardiac: regular, no murmur Chest: no wheeze Abd: abd binder in place Ext: 1+ edema Skin: no rashes   LABS:  BMET  Recent Labs Lab 04/09/15 0500 04/09/15 1745 04/10/15 0420  NA 135 135 134*  K 4.8 4.3 4.4  CL 97* 97* 98*  CO2 BUN CREATININE 0.82 0.81 0.77  GLUCOSE 197* 176* 200*    Electrolytes  Recent Labs Lab 04/06/15 0503 04/07/15 1520 04/08/15 0415  04/09/15 0500 04/09/15 1745 04/10/15 0420  CALCIUM 8.0* 8.3* 8.5*  < > 8.5* 8.5* 8.3*  MG 2.0  --  2.1  --  2.1  --   --   PHOS 3.1 3.2  --   --  3.5  --   --   < > = values in this interval not displayed.  CBC  Recent Labs Lab 04/08/15 1923 04/09/15 0500 04/09/15 1745  WBC 19.1* 22.8* 27.9*  HGB 10.6* 9.9* 9.5*  HCT 32.4* 31.1* 28.9*  PLT 529* 621* 642*    Liver Enzymes  Recent Labs Lab 04/06/15 0503 04/08/15 1923 04/09/15 0500  AST 67* 108* 70*  ALT 76* 135* 116*  ALKPHOS 78 134* 122  BILITOT 4.8* 6.1* 4.9*  ALBUMIN 1.5* 1.6* 1.5*   Glucose  Recent Labs Lab 04/09/15 1203 04/09/15 1455 04/09/15 1533 04/09/15 1933 04/09/15 2354 04/10/15 0356  GLUCAP 192* 139* 171* 173* 172* 186*     Imaging Dg Chest Port 1 View  04/10/2015  CLINICAL DATA:  Respiratory failure EXAM: PORTABLE CHEST 1 VIEW COMPARISON:  04/09/2015 FINDINGS: Cardiomediastinal silhouette is stable. Study is limited by poor inspiration. Central mild vascular congestion without convincing pulmonary edema. Mild basilar atelectasis. Elevation of the right hemidiaphragm again noted. NG tube in place. Stable right arm PICC line position with tip in SVC right atrium junction. Skin staples are noted in left upper abdomen medially. Endotracheal tube has been removed. IMPRESSION: Limited study by poor inspiration. Central mild vascular congestion without convincing pulmonary edema. Mild basilar atelectasis. No segmental infiltrate. Stable right arm PICC line and NG tube position. Endotracheal tube has been removed. Electronically Signed   By: Natasha Mead M.D.   On: 04/10/2015 07:47   Dg Abd Portable 1v  04/10/2015  CLINICAL DATA:  Nasogastric tube positioning. EXAM: PORTABLE ABDOMEN - 1 VIEW COMPARISON:  03/27/2015 FINDINGS: A nasogastric tube is present which is partially blurred by motion. The tip likely lies in the region of the gastric antrum. No evidence of significant bowel obstruction or ileus. IMPRESSION: Nasogastric tube tip likely lies in the region of the gastric antrum. Electronically Signed   By: Irish Lack M.D.   On: 04/10/2015 07:48      STUDIES:  2/17 CT abd/pelvis >>Lt sided retroperitoneal hematoma 2/19 Doppler b/l legs >> DVT Lt common femoral vein  CULTURES: 2/23 Blood >> negative 2/24 Sputum >> Few candida  ANTIBIOTICS: 2/23 Vancomycin >> 2/26 2/23 Zosyn >> 3/02  SIGNIFICANT EVENTS: 2/17 L5 laminectomy >> retroperitoneal hemorrhage; vascular surgery consulted >> Lt common iliac to Lt common femoral bypass 2/19 abdominal washout, wound vac; DVT Lt leg >> start heparin gtt 2/20 General surgery consulted 2/21 To OR 2/23 To OR 2/27 To OR 3/01 To OR >> abdominal wall closure with  mesh  LINES/TUBES: 2/17 IJ CVL >> 2/25 2/17 ETT >> 3/02 2/25 Rt PICC >>   DISCUSSION: 45 yo female with back and leg pain from L5-S1 spondylolisthesis.  She had L5 laminectomy on 2/17 >> c/b retroperitoneal hemorrhage.  She required laparotomy with Lt common iliac to common femoral bypass.  Her abdomen has remained open due to edema, and she has required repeated trips to the OR.  She remains on the vent for OR trips.  She has hx of Asthma, GERD.  ASSESSMENT / PLAN:  PULMONARY A: Acute respiratory failure in setting of retroperitoneal hemorrhage. P:  Bronchial hygiene F/u CXR as needed  CARDIOVASCULAR A: Hemorrhagic shock 2nd to retroperitoneal bleeding >> resolved. Lt leg DVT noted 2/19. P:  Continue heparin gtt >> will eventually need to transition to DOAC or coumadin when able to take pills Monitor hemodynamics  RENAL A:  Hypervolemia >> improved. Hypokalemia. P:  Even to negative fluid balance as tolerated Replace electrolytes as needed  GASTROINTESTINAL A:  S/p laparotomy with difficulty closing abdominal wound due to edema. Nutrition. P: Post op care per surgery TNA, reglan per surgery Protonix for SUP  HEMATOLOGIC A:  Anemia 2nd to acute blood loss, and critical illness. P:  F/u CBC intermittently Transfuse for Hb < 7  INFECTIOUS A:  HCAP >> completed ABx 3/02. P:  Monitor clinically  ENDOCRINE A:  Hyperglycemia. P:  SSI  NEUROLOGIC A:  L5-S1 spondylolisthesis. Sedation. P:  Continue morphine PCA Mobilize when okay with neurosurgery   Okay to transfer to SDU from PCCM standpoint.  If she transfer to SDU, then recommend consulting [facility] as needed to assist with medical management.  If she remains in ICU, then PCCM will f/u on Monday 3/06 to assist with medical management >> call if help needed sooner.   [name], MD [facility] Pulmonary[facility]Critical [facility] 04/10/2015, 8:47 AM Pager:   [phone] After 3pm call: [phone]

## 2015-04-10 NOTE — Progress Notes (Signed)
2 Days Post-Op  Subjective: Pt doing well. Extubated yesterday  Objective: Vital signs in last 24 hours: Temp:  [98.3 F (36.8 C)-99.4 F (37.4 C)] 98.6 F (37 C) (03/03 0400) Pulse Rate:  [98-127] 122 (03/03 0800) Resp:  [16-31] 18 (03/03 0800) BP: (96-144)/(56-115) 140/63 mmHg (03/03 0800) SpO2:  [98 %-100 %] 100 % (03/03 0800) FiO2 (%):  [21 %] 21 % (03/03 0000) Last BM Date: 04/09/15  Intake/Output from previous day: 03/02 0701 - 03/03 0700 In: 3561.6 [I.V.:914.8; NG/GT:30; TPN:2616.8] Out: 3785 [Urine:2875; Emesis/NG output:800; Drains:110] Intake/Output this shift: Total I/O In: 282.5 [I.V.:42.5; TPN:240] Out: 125 [Urine:125]  GI: soft, midline incision c/d/i, JP SS  Lab Results:   Recent Labs  04/09/15 0500 04/09/15 1745  WBC 22.8* 27.9*  HGB 9.9* 9.5*  HCT 31.1* 28.9*  PLT 621* 642*   BMET  Recent Labs  04/09/15 1745 04/10/15 0420  NA 135 134*  K 4.3 4.4  CL 97* 98*  CO2 27 28  GLUCOSE 176* 200*  BUN 19 19  CREATININE 0.81 0.77  CALCIUM 8.5* 8.3*   PT/INR No results for input(s): LABPROT, INR in the last 72 hours. ABG No results for input(s): PHART, HCO3 in the last 72 hours.  Invalid input(s): PCO2, PO2  Studies/Results: Dg Chest Port 1 View  04/10/2015  CLINICAL DATA:  Respiratory failure EXAM: PORTABLE CHEST 1 VIEW COMPARISON:  04/09/2015 FINDINGS: Cardiomediastinal silhouette is stable. Study is limited by poor inspiration. Central mild vascular congestion without convincing pulmonary edema. Mild basilar atelectasis. Elevation of the right hemidiaphragm again noted. NG tube in place. Stable right arm PICC line position with tip in SVC right atrium junction. Skin staples are noted in left upper abdomen medially. Endotracheal tube has been removed. IMPRESSION: Limited study by poor inspiration. Central mild vascular congestion without convincing pulmonary edema. Mild basilar atelectasis. No segmental infiltrate. Stable right arm PICC line and NG  tube position. Endotracheal tube has been removed. Electronically Signed   By: Natasha Mead M.D.   On: 04/10/2015 07:47   Dg Chest Port 1 View  04/09/2015  CLINICAL DATA:  Follow-up respiratory failure. History of exploratory laparotomy. EXAM: PORTABLE CHEST 1 VIEW COMPARISON:  Chest x-rays dated 04/08/2015 in 03/2026 2017. FINDINGS: Endotracheal tube remains well positioned with tip just above the level of the carina. Right-sided PICC line is stable in position. Nasogastric tube passes below the diaphragm. Slightly low lung volumes. Heart size is upper normal, likely accentuated by the low lung volumes. Given the low lung volumes, lungs appear clear. No evidence of pneumonia. No pleural effusions seen. No pneumothorax. IMPRESSION: Stable chest x-ray, again with low lung volumes. No acute findings. Tubes and lines remain well-positioned. Electronically Signed   By: Bary Richard M.D.   On: 04/09/2015 07:54   Dg Abd Portable 1v  04/10/2015  CLINICAL DATA:  Nasogastric tube positioning. EXAM: PORTABLE ABDOMEN - 1 VIEW COMPARISON:  03/27/2015 FINDINGS: A nasogastric tube is present which is partially blurred by motion. The tip likely lies in the region of the gastric antrum. No evidence of significant bowel obstruction or ileus. IMPRESSION: Nasogastric tube tip likely lies in the region of the gastric antrum. Electronically Signed   By: Irish Lack M.D.   On: 04/10/2015 07:48    Anti-infectives: Anti-infectives    Start     Dose/Rate Route Frequency Ordered Stop   04/02/15 2330  vancomycin (VANCOCIN) IVPB 1000 mg/200 mL premix  Status:  Discontinued     1,000 mg 200 mL/hr  over 60 Minutes Intravenous Every 12 hours 04/02/15 1015 04/05/15 0849   04/02/15 1830  piperacillin-tazobactam (ZOSYN) IVPB 3.375 g  Status:  Discontinued     3.375 g 12.5 mL/hr over 240 Minutes Intravenous 3 times per day 04/02/15 1015 04/09/15 0804   04/02/15 1100  cefUROXime (ZINACEF) 1.5 g in dextrose 5 % 50 mL IVPB  Status:   Discontinued     1.5 g 100 mL/hr over 30 Minutes Intravenous To ShortStay Surgical 04/01/15 1106 04/02/15 1448   04/02/15 1100  vancomycin (VANCOCIN) 2,500 mg in sodium chloride 0.9 % 500 mL IVPB     2,500 mg 250 mL/hr over 120 Minutes Intravenous  Once 04/02/15 1029 04/02/15 1342   04/02/15 1030  vancomycin (VANCOCIN) 2,250 mg in sodium chloride 0.9 % 500 mL IVPB  Status:  Discontinued     2,250 mg 250 mL/hr over 120 Minutes Intravenous  Once 04/02/15 1015 04/02/15 1029   04/02/15 1030  piperacillin-tazobactam (ZOSYN) IVPB 3.375 g     3.375 g 100 mL/hr over 30 Minutes Intravenous  Once 04/02/15 1015 04/02/15 1212   03/27/15 2130  cefUROXime (ZINACEF) 1.5 g in dextrose 5 % 50 mL IVPB  Status:  Discontinued     1.5 g 100 mL/hr over 30 Minutes Intravenous Every 12 hours 03/27/15 2124 03/27/15 2132   03/27/15 1600  ceFAZolin (ANCEF) IVPB 2 g/50 mL premix     2 g 100 mL/hr over 30 Minutes Intravenous Every 8 hours 03/27/15 1322 03/28/15 0759   03/27/15 1447  ceFAZolin (ANCEF) 1-5 GM-% IVPB    Comments:  Merril AbbeButler, David   : cabinet override      03/27/15 1447 03/28/15 0259   03/27/15 0800  bacitracin 50,000 Units in sodium chloride irrigation 0.9 % 500 mL irrigation  Status:  Discontinued       As needed 03/27/15 0943 03/27/15 1223   03/27/15 0700  ceFAZolin (ANCEF) 3 g in dextrose 5 % 50 mL IVPB     3 g 130 mL/hr over 30 Minutes Intravenous To ShortStay Surgical 03/26/15 1409 03/27/15 0750      Assessment/Plan: s/p Procedure(s): EXPLORATORY LAPAROTOMY/ABD. WALL CLOSURE (N/A) Midline incision looks good, con't to follow skin at midline wound. JPs appear to be draining more SS fluid, stable.  Con't JPs From abdominal standpoint OK to return to OR for NSR at any point with appropropiate padding   LOS: 14 days    Marigene Ehlersamirez Jr., [facility] 04/10/2015

## 2015-04-10 NOTE — Progress Notes (Signed)
ANTICOAGULATION CONSULT NOTE - Follow Up Consult  Pharmacy Consult for Heparin  Indication: DVT  Allergies  Allergen Reactions  . Propoxyphene Other (See Comments)    Unspecified    Patient Measurements: Height: 5\' 3"  (160 cm) Weight: 274 lb (124.286 kg) IBW/kg (Calculated) : 52.4 Heparin dosing wt: 84.5kg  Vital Signs: Temp: 98.6 F (37 C) (03/03 0400) Temp Source: Oral (03/03 0400) BP: 132/66 mmHg (03/03 0700) Pulse Rate: 117 (03/03 0700)  Labs:  Recent Labs  04/08/15 1923 04/08/15 1924 04/09/15 0500 04/09/15 0520 04/09/15 1745 04/10/15 0415 04/10/15 0420  HGB 10.6*  --  9.9*  --  9.5*  --   --   HCT 32.4*  --  31.1*  --  28.9*  --   --   PLT 529*  --  621*  --  642*  --   --   HEPARINUNFRC  --  <0.10*  --  0.52  --  0.72*  --   CREATININE 0.71  --  0.82  --  0.81  --  0.77    Estimated Creatinine Clearance: 113.8 mL/min (by C-G formula based on Cr of 0.77).   Medications: Heparin @ 2300 units/hr  Assessment: 45 y.o female with hemorraghic shock from large RP hematoma that occurred during an elective laminectomy. She was found to have a left femoral DVT and was started on IV heparin 2/19. She is now POD#2 abdominal closure and continues on heparin. Heparin level is slightly above goal at 0.72. No CBC today. No bleeding.  Goal of Therapy:  Heparin level 0.3-0.7 units/ml Monitor platelets by anticoagulation protocol: Yes   Plan:  1) Decrease heparin to 2250 units/hr 2) Heparin level, CBC in AM 3) Follow up oral anticoagulation  Louie CasaJennifer Ottis Sarnowski, PharmD, BCPS 04/10/2015 8:29 AM

## 2015-04-11 LAB — BASIC METABOLIC PANEL
ANION GAP: 8 (ref 5–15)
BUN: 24 mg/dL — ABNORMAL HIGH (ref 6–20)
CALCIUM: 8.3 mg/dL — AB (ref 8.9–10.3)
CO2: 26 mmol/L (ref 22–32)
Chloride: 101 mmol/L (ref 101–111)
Creatinine, Ser: 0.79 mg/dL (ref 0.44–1.00)
GFR calc Af Amer: >60 mL/min (ref >60)
GLUCOSE: 208 mg/dL — AB (ref 65–99)
Potassium: 4 mmol/L (ref 3.5–5.1)
SODIUM: 135 mmol/L (ref 135–145)

## 2015-04-11 LAB — CBC
HCT: 23.1 % — ABNORMAL LOW (ref 36.0–46.0)
Hemoglobin: 7.6 g/dL — ABNORMAL LOW (ref 12.0–15.0)
MCH: 29.6 pg (ref 26.0–34.0)
MCHC: 32.9 g/dL (ref 30.0–36.0)
MCV: 89.9 fL (ref 78.0–100.0)
PLATELETS: 609 10*3/uL — AB (ref 150–400)
RBC: 2.57 MIL/uL — AB (ref 3.87–5.11)
RDW: 16.1 % — AB (ref 11.5–15.5)
WBC: 30 10*3/uL — AB (ref 4.0–10.5)

## 2015-04-11 LAB — GLUCOSE, CAPILLARY
GLUCOSE-CAPILLARY: 187 mg/dL — AB (ref 65–99)
GLUCOSE-CAPILLARY: 202 mg/dL — AB (ref 65–99)
Glucose-Capillary: 133 mg/dL — ABNORMAL HIGH (ref 65–99)
Glucose-Capillary: 177 mg/dL — ABNORMAL HIGH (ref 65–99)
Glucose-Capillary: 197 mg/dL — ABNORMAL HIGH (ref 65–99)
Glucose-Capillary: 99 mg/dL (ref 65–99)

## 2015-04-11 LAB — HEPARIN LEVEL (UNFRACTIONATED): HEPARIN UNFRACTIONATED: 0.56 [IU]/mL (ref 0.30–0.70)

## 2015-04-11 MED ORDER — INSULIN ASPART 100 UNIT/ML ~~LOC~~ SOLN
0.0000 [IU] | SUBCUTANEOUS | Status: AC
Start: 2015-04-11 — End: 2015-04-11
  Administered 2015-04-11: 5 [IU] via SUBCUTANEOUS

## 2015-04-11 MED ORDER — INSULIN ASPART 100 UNIT/ML ~~LOC~~ SOLN: 0.0000 [IU] | SUBCUTANEOUS | Status: DC

## 2015-04-11 MED ORDER — TRACE MINERALS CR-CU-MN-SE-ZN 10-1000-500-60 MCG/ML IV SOLN
INTRAVENOUS | Status: AC
  Administered 2015-04-11: 18:00:00 via INTRAVENOUS
  Filled 2015-04-11 (×2): qty 2640

## 2015-04-11 MED ORDER — TRACE MINERALS CR-CU-MN-SE-ZN 10-1000-500-60 MCG/ML IV SOLN: INTRAVENOUS | Status: DC

## 2015-04-11 NOTE — Progress Notes (Signed)
ANTICOAGULATION CONSULT NOTE - Follow Up Consult  Pharmacy Consult for Heparin  Indication: DVT  Allergies  Allergen Reactions  . Propoxyphene Other (See Comments)    Unspecified    Patient Measurements: Height: 5\' 3"  (160 cm) Weight: 274 lb (124.286 kg) IBW/kg (Calculated) : 52.4 Heparin dosing wt: 84.5kg  Vital Signs: Temp: 99.1 F (37.3 C) (03/04 0810) Temp Source: Oral (03/04 0810) BP: 140/49 mmHg (03/04 0900) Pulse Rate: 112 (03/04 0900)  Labs:  Recent Labs  04/09/15 0500 04/09/15 0520 04/09/15 1745 04/10/15 0415 04/10/15 0420 04/11/15 0543  HGB 9.9*  --  9.5*  --   --  7.6*  HCT 31.1*  --  28.9*  --   --  23.1*  PLT 621*  --  642*  --   --  609*  HEPARINUNFRC  --  0.52  --  0.72*  --  0.56  CREATININE 0.82  --  0.81  --  0.77 0.79    Estimated Creatinine Clearance: 113.8 mL/min (by C-G formula based on Cr of 0.79).   Medications: Heparin @ 2250 units/hr  Assessment: 45 y.o female with hemorraghic shock from large RP hematoma that occurred during an elective laminectomy. She was found to have a left femoral DVT and was started on IV heparin 2/19. She is now POD#3 abdominal closure and continues on heparin. Heparin level is therapeutic at 0.56. Significant drop in Hgb 9.5 > 7.6.   Goal of Therapy:  Heparin level 0.3-0.7 units/ml Monitor platelets by anticoagulation protocol: Yes   Plan:  1) Continue heparin at 2250 units/hr 2) Heparin level, CBC in AM - watch Hgb 3) Follow up oral anticoagulation  Louie CasaJennifer Rosa Wyly, PharmD, BCPS 04/11/2015 10:47 AM

## 2015-04-11 NOTE — Progress Notes (Signed)
Pt seen this am, doing well. No issues overnight. Awake, alret, appropriate. Moving BLE good strength. Will need to instrument L5-S1 when able to tolerate prone surgery.

## 2015-04-11 NOTE — Progress Notes (Signed)
3 Days Post-Op  Subjective: Pt with no acute changes  Objective: Vital signs in last 24 hours: Temp:  [98.5 F (36.9 C)-100.7 F (38.2 C)] 98.5 F (36.9 C) (03/04 0404) Pulse Rate:  [112-133] 112 (03/04 0700) Resp:  [18-35] 30 (03/04 0700) BP: (99-140)/(31-73) 129/73 mmHg (03/04 0700) SpO2:  [96 %-100 %] 99 % (03/04 0700) Last BM Date: 04/09/15  Intake/Output from previous day: 03/03 0701 - 03/04 0700 In: 4621.5 [P.O.:500; I.V.:767.5; NG/GT:30; TPN:3324] Out: 4930 [Urine:3050; Emesis/NG output:1850; Drains:30] Intake/Output this shift:    General appearance: alert and cooperative GI: soft, approp ttp, m/l incision c/d/u, skin viable, JP SS  Lab Results:   Recent Labs  04/09/15 1745 04/11/15 0543  WBC 27.9* 30.0*  HGB 9.5* 7.6*  HCT 28.9* 23.1*  PLT 642* 609*   BMET  Recent Labs  04/10/15 0420 04/11/15 0543  NA 134* 135  K 4.4 4.0  CL 98* 101  CO2 28 26  GLUCOSE 200* 208*  BUN 19 24*  CREATININE 0.77 0.79  CALCIUM 8.3* 8.3*   PT/INR No results for input(s): LABPROT, INR in the last 72 hours. ABG No results for input(s): PHART, HCO3 in the last 72 hours.  Invalid input(s): PCO2, PO2  Studies/Results: Dg Chest Port 1 View  04/10/2015  CLINICAL DATA:  Respiratory failure EXAM: PORTABLE CHEST 1 VIEW COMPARISON:  04/09/2015 FINDINGS: Cardiomediastinal silhouette is stable. Study is limited by poor inspiration. Central mild vascular congestion without convincing pulmonary edema. Mild basilar atelectasis. Elevation of the right hemidiaphragm again noted. NG tube in place. Stable right arm PICC line position with tip in SVC right atrium junction. Skin staples are noted in left upper abdomen medially. Endotracheal tube has been removed. IMPRESSION: Limited study by poor inspiration. Central mild vascular congestion without convincing pulmonary edema. Mild basilar atelectasis. No segmental infiltrate. Stable right arm PICC line and NG tube position. Endotracheal tube  has been removed. Electronically Signed   By: Natasha MeadLiviu  Pop M.D.   On: 04/10/2015 07:47   Dg Abd Portable 1v  04/10/2015  CLINICAL DATA:  Nasogastric tube positioning. EXAM: PORTABLE ABDOMEN - 1 VIEW COMPARISON:  03/27/2015 FINDINGS: A nasogastric tube is present which is partially blurred by motion. The tip likely lies in the region of the gastric antrum. No evidence of significant bowel obstruction or ileus. IMPRESSION: Nasogastric tube tip likely lies in the region of the gastric antrum. Electronically Signed   By: Irish LackGlenn  Yamagata M.D.   On: 04/10/2015 07:48    Anti-infectives: Anti-infectives    Start     Dose/Rate Route Frequency Ordered Stop   04/02/15 2330  vancomycin (VANCOCIN) IVPB 1000 mg/200 mL premix  Status:  Discontinued     1,000 mg 200 mL/hr over 60 Minutes Intravenous Every 12 hours 04/02/15 1015 04/05/15 0849   04/02/15 1830  piperacillin-tazobactam (ZOSYN) IVPB 3.375 g  Status:  Discontinued     3.375 g 12.5 mL/hr over 240 Minutes Intravenous 3 times per day 04/02/15 1015 04/09/15 0804   04/02/15 1100  cefUROXime (ZINACEF) 1.5 g in dextrose 5 % 50 mL IVPB  Status:  Discontinued     1.5 g 100 mL/hr over 30 Minutes Intravenous To ShortStay Surgical 04/01/15 1106 04/02/15 1448   04/02/15 1100  vancomycin (VANCOCIN) 2,500 mg in sodium chloride 0.9 % 500 mL IVPB     2,500 mg 250 mL/hr over 120 Minutes Intravenous  Once 04/02/15 1029 04/02/15 1342   04/02/15 1030  vancomycin (VANCOCIN) 2,250 mg in sodium chloride 0.9 %  500 mL IVPB  Status:  Discontinued     2,250 mg 250 mL/hr over 120 Minutes Intravenous  Once 04/02/15 1015 04/02/15 1029   04/02/15 1030  piperacillin-tazobactam (ZOSYN) IVPB 3.375 g     3.375 g 100 mL/hr over 30 Minutes Intravenous  Once 04/02/15 1015 04/02/15 1212   03/27/15 2130  cefUROXime (ZINACEF) 1.5 g in dextrose 5 % 50 mL IVPB  Status:  Discontinued     1.5 g 100 mL/hr over 30 Minutes Intravenous Every 12 hours 03/27/15 2124 03/27/15 2132   03/27/15 1600   ceFAZolin (ANCEF) IVPB 2 g/50 mL premix     2 g 100 mL/hr over 30 Minutes Intravenous Every 8 hours 03/27/15 1322 03/28/15 0759   03/27/15 1447  ceFAZolin (ANCEF) 1-5 GM-% IVPB    Comments:  Merril Abbe   : cabinet override      03/27/15 1447 03/28/15 0259   03/27/15 0800  bacitracin 50,000 Units in sodium chloride irrigation 0.9 % 500 mL irrigation  Status:  Discontinued       As needed 03/27/15 0943 03/27/15 1223   03/27/15 0700  ceFAZolin (ANCEF) 3 g in dextrose 5 % 50 mL IVPB     3 g 130 mL/hr over 30 Minutes Intravenous To ShortStay Surgical 03/26/15 1409 03/27/15 0750      Assessment/Plan: s/p Procedure(s): EXPLORATORY LAPAROTOMY/ABD. WALL CLOSURE (N/A) Con't to monitor skin at midline incision abd binder may help with abdominal pain Will follow along   LOS: 15 days    Marigene Ehlers., [facility] 04/11/2015

## 2015-04-11 NOTE — Progress Notes (Signed)
Patient had an uneventful night. Patient continues to have sinus tach (120's) and reports adequate pain control with morphine PCA. NG output was 700 cc and JP drain output was 10 cc (drain # 1 on left).

## 2015-04-11 NOTE — Progress Notes (Signed)
PARENTERAL NUTRITION CONSULT NOTE - FOLLOW UP  Pharmacy Consult:  TPN Indication:  Open abdomen  Allergies  Allergen Reactions  . Propoxyphene Other (See Comments)    Unspecified    Patient Measurements: Height:  (160 cm) Weight: 274 lb (124.286 kg) IBW/kg (Calculated) : 52.4  BMI = 59.7  Vital Signs: Temp: 98.5 F (36.9 C) (03/04 0404) Temp Source: Oral (03/04 0404) BP: 129/73 mmHg (03/04 0700) Pulse Rate: 112 (03/04 0700) Intake/Output from previous day: 03/03 0701 - 03/04 0700 In: 4621.5 [P.O.:500; I.V.:767.5; NG/GT:30; ZOX:0960] Out: 4930 [Urine:3050; Emesis/NG output:1850; Drains:30]  Labs:  Recent Labs  04/09/15 0500 04/09/15 1745 04/11/15 0543  WBC 22.8* 27.9* 30.0*  HGB 9.9* 9.5* 7.6*  HCT 31.1* 28.9* 23.1*  PLT 621* 642* 609*     Recent Labs  04/08/15 1923 04/09/15 0500 04/09/15 1745 04/10/15 0420 04/11/15 0543  NA 134* 135 135 134* 135  K 4.3 4.8 4.3 4.4 4.0  CL 96* 97* 97* 98* 101  CO2 GLUCOSE 184* 197* 176* 200* 208*  BUN 22* 24*  CREATININE 0.71 0.82 0.81 0.77 0.79  CALCIUM 8.5* 8.5* 8.5* 8.3* 8.3*  MG  --  2.1  --   --   --   PHOS  --  3.5  --   --   --   PROT 6.3* 6.3*  --   --   --   ALBUMIN 1.6* 1.5*  --   --   --   AST 108* 70*  --   --   --   ALT 135* 116*  --   --   --   ALKPHOS 134* 122  --   --   --   BILITOT 6.1* 4.9*  --   --   --    Estimated Creatinine Clearance: 113.8 mL/min (by C-G formula based on Cr of 0.79).    Recent Labs  04/10/15 1955 04/10/15 2353 04/11/15 0402  GLUCAP 158* 177* 197*     Insulin Requirements in the past 24 hours:  6 units SSI  Current Nutrition:  Clinimix E 5/15 at goal rate of 110 ml/hr and start 20% lipid at 10 ml/hr on Tues and Thurs only to minimize kCal provision.  TPN will provide a weekly average of 2011 kCal and 32gm protein per day, exceeding kCal goal by 6% and meeting 100% of protein need  Nutritional Goals: (per RD assessment on  2/28) 4540-9811 kCal, 130-140 grams of protein per day  Assessment: 44 YOF admitted on 03/27/15 with back and leg pain and underwent L5 laminectomy with facetectomy later that day.  Intraoperatively patient went into shock and CT revealed large left RP hematoma.  Patient returned to the OR for ex-lap with repair of left iliac vein, ligation of distal left CIA, left CIA-CFA bypass, and placement of abdominal VAC.  Pharmacy consulted to manage TPN for nutritional support.  GI: hx GERD / obesity.  Baseline prealbumin low at 8.4 >> 9.7.  Frequent OR visits for abd washout (2/19, 2/21, 2/27) - too edematous for abd closure on 2/23, unable to close fascia over intestines on 2/27, s/p abdominal wall closure with mesh on 3/1. Tolerated cyclic TPN on 3/3, no SOB reported by pt. NG/emesis O/P , drain O/P 30 mL.  PPI IV, PO docusate, Reglan   Endo: no hx DM - hypoglycemic prior to TPN initiation, CBGs 158-208 Lytes: K 4, Phos 3.5, Mg 2.1, Na 135 - no replacement needed  today Renal: SCr stable, CrCL > 100 ml/min - good UOP 1 ml/kg/hr, NS at 10 ml/hr Pulm: extubated 3/2 on RA Cards: angina - BP ok, tachy - Lasix IV x1 AC: Heparin for new left femoral DVT (dx 03/29/15) - hgb 7.6, plts elevated Hepatobil: AST/ALT 70/116 << 108/135, tbili increased 4.9 (RN noted a little jaundice in sclera on 3/3).  TG 159>>240 - watch Neuro: morphine PCA, pain score 2-7 ID: Tmax/24h: 100.7, WBC up to 30 << 27.9, off antibiotics Best Practices: heparin gtt, MC  TPN Access: right IJ placed 03/27/15 TPN start date: 2/21 >>   Plan:  - Begin to cycle TPN over 14 hrs today - Clinimix E 5/15 100 ml/hr x 1 hr, 203 ml/hr x 12 hr, 100 ml/hr x 1 hr (total volume 2640 ml) - Increase to 14 units regular insulin to TPN - Will plan to cycle IVFE 20% on Tues and Thurs if tolerates - TPN  + IVFE will provide a weekly average of 2011 kCal and 132gm protein per day, exceeding kCal goal by 6% and meeting 100% of protein need. - Daily  multivitamin and trace elements, watch tbili and monitor for jaundice - Change moderate SSI to custom schedule to monitor cyclic TPN (5a, 9a, 4p, 8p)   Loura BackJennifer Lismore, [address] Rainbow BoulevardPharm.D., BCPS Clinical Pharmacist Pager: [phone] 04/11/2015 7:36 AM

## 2015-04-12 ENCOUNTER — Inpatient Hospital Stay (HOSPITAL_COMMUNITY): Payer: PRIVATE HEALTH INSURANCE

## 2015-04-12 LAB — GLUCOSE, CAPILLARY
GLUCOSE-CAPILLARY: 131 mg/dL — AB (ref 65–99)
GLUCOSE-CAPILLARY: 188 mg/dL — AB (ref 65–99)
GLUCOSE-CAPILLARY: 191 mg/dL — AB (ref 65–99)
GLUCOSE-CAPILLARY: 203 mg/dL — AB (ref 65–99)
Glucose-Capillary: 127 mg/dL — ABNORMAL HIGH (ref 65–99)
Glucose-Capillary: 199 mg/dL — ABNORMAL HIGH (ref 65–99)

## 2015-04-12 LAB — CBC
HCT: 20.2 % — ABNORMAL LOW (ref 36.0–46.0)
HEMOGLOBIN: 6.5 g/dL — AB (ref 12.0–15.0)
MCH: 28.8 pg (ref 26.0–34.0)
MCHC: 32.2 g/dL (ref 30.0–36.0)
MCV: 89.4 fL (ref 78.0–100.0)
PLATELETS: 658 10*3/uL — AB (ref 150–400)
RBC: 2.26 MIL/uL — AB (ref 3.87–5.11)
RDW: 16.5 % — ABNORMAL HIGH (ref 11.5–15.5)
WBC: 32.7 10*3/uL — AB (ref 4.0–10.5)

## 2015-04-12 LAB — PREPARE RBC (CROSSMATCH)

## 2015-04-12 LAB — HEPARIN LEVEL (UNFRACTIONATED): Heparin Unfractionated: 0.62 IU/mL (ref 0.30–0.70)

## 2015-04-12 MED ORDER — INSULIN ASPART 100 UNIT/ML ~~LOC~~ SOLN
0.0000 [IU] | SUBCUTANEOUS | Status: DC
  Administered 2015-04-12: 2 [IU] via SUBCUTANEOUS
  Administered 2015-04-12 (×2): 3 [IU] via SUBCUTANEOUS
  Administered 2015-04-13: 2 [IU] via SUBCUTANEOUS
  Administered 2015-04-13: 8 [IU] via SUBCUTANEOUS
  Administered 2015-04-13: 3 [IU] via SUBCUTANEOUS

## 2015-04-12 MED ORDER — TRACE MINERALS CR-CU-MN-SE-ZN 10-1000-500-60 MCG/ML IV SOLN
INTRAVENOUS | Status: AC
  Administered 2015-04-12: 18:00:00 via INTRAVENOUS
  Filled 2015-04-12 (×2): qty 2640

## 2015-04-12 MED ORDER — SODIUM CHLORIDE 0.9 % IV SOLN
Freq: Once | INTRAVENOUS | Status: AC
  Administered 2015-04-12: 10 mL/h via INTRAVENOUS

## 2015-04-12 NOTE — Progress Notes (Signed)
ANTICOAGULATION CONSULT NOTE - Follow Up Consult  Pharmacy Consult for Heparin  Indication: DVT  Allergies  Allergen Reactions  . Propoxyphene Other (See Comments)    Unspecified    Patient Measurements: Height: 5\' 3"  (160 cm) Weight: 285 lb (129.275 kg) IBW/kg (Calculated) : 52.4 Heparin dosing wt: 84.5kg  Vital Signs: Temp: 99.2 F (37.3 C) (03/05 1015) Temp Source: Oral (03/05 1015) BP: 120/61 mmHg (03/05 1015) Pulse Rate: 117 (03/05 1015)  Labs:  Recent Labs  04/09/15 1745 04/10/15 0415 04/10/15 0420 04/11/15 0543 04/12/15 0440  HGB 9.5*  --   --  7.6* 6.5*  HCT 28.9*  --   --  23.1* 20.2*  PLT 642*  --   --  609* 658*  HEPARINUNFRC  --  0.72*  --  0.56 0.62  CREATININE 0.81  --  0.77 0.79  --     Estimated Creatinine Clearance: 116.6 mL/min (by C-G formula based on Cr of 0.79).   Medications: Heparin @ 2250 units/hr  Assessment: 45 y.o female with hemorraghic shock from large RP hematoma that occurred during an elective laminectomy. She was found to have a left femoral DVT and was started on IV heparin 2/19. She is now POD#4 abdominal closure and continues on heparin. Heparin level is therapeutic at 0.62. Hgb continues to trend down 9.5 > 7.6 > 6.5, she will receive 1 unit PRBCs, ok to continue heparin for now per MD.   Goal of Therapy:  Heparin level 0.3-0.7 units/ml Monitor platelets by anticoagulation protocol: Yes   Plan:  1) Continue heparin at 2250 units/hr 2) Heparin level, CBC in AM - watch Hgb 3) Follow up oral anticoagulation  Louie CasaJennifer Stephan Draughn, PharmD, BCPS 04/12/2015 10:27 AM

## 2015-04-12 NOTE — Progress Notes (Signed)
Pt became nauseous and vomited around 1400, nausea began around 13:20.  Around this time, RN noticed pt gown was wet and lifted it to find that her abdominal incision that was dry and unbandaged is now bleeding/leaking a serosanguineous fluid. Crystal, RN made a call to Dr. Magnus IvanBlackman who saw the pt abdomen this am on his rounds and his instructions were to let the serosanguineous fluid ooze out.   I have spoken with the family and assured them we will keep a close eye on her incision and call the Dr back if anything changes or if frank blood is noted.   Kristina Estes

## 2015-04-12 NOTE — Progress Notes (Signed)
PARENTERAL NUTRITION CONSULT NOTE - FOLLOW UP  Pharmacy Consult:  TPN Indication:  Open abdomen s/p closure  Allergies  Allergen Reactions  . Propoxyphene Other (See Comments)    Unspecified    Patient Measurements: Height: 5\' 3"  (160 cm) Weight: 285 lb (129.275 kg) IBW/kg (Calculated) : 52.4  BMI = 59.7  Vital Signs: Temp: 98.2 F (36.8 C) (03/05 0812) Temp Source: Oral (03/05 0812) BP: 128/52 mmHg (03/05 0700) Pulse Rate: 122 (03/05 0400) Intake/Output from previous day: 03/04 0701 - 03/05 0700 In: 3432 [I.V.:715; NG/GT:60; ZOX:0960]TPN:2657] Out: 2395 [Urine:2325; Emesis/NG output:50; Drains:20]  Labs:  Recent Labs  04/09/15 1745 04/11/15 0543 04/12/15 0440  WBC 27.9* 30.0* 32.7*  HGB 9.5* 7.6* 6.5*  HCT 28.9* 23.1* 20.2*  PLT 642* 609* 658*     Recent Labs  04/09/15 1745 04/10/15 0420 04/11/15 0543  NA 135 134* 135  K 4.3 4.4 4.0  CL 97* 98* 101  CO2 27 28 26   GLUCOSE 176* 200* 208*  BUN 19 19 24*  CREATININE 0.81 0.77 0.79  CALCIUM 8.5* 8.3* 8.3*   Estimated Creatinine Clearance: 116.6 mL/min (by C-G formula based on Cr of 0.79).    Recent Labs  04/11/15 2013 04/12/15 0015 04/12/15 0418  GLUCAP 133* 191* 203*     Insulin Requirements in the past 24 hours:  SSI d/c'd 3/4 by RN  Current Nutrition:  Clinimix E 5/15 at goal rate of 110 ml/hr and start 20% lipid at 10 ml/hr on Tues and Thurs only to minimize kCal provision.  TPN will provide a weekly average of 2011 kCal and 32gm protein per day, exceeding kCal goal by 6% and meeting 100% of protein need  Nutritional Goals: (per RD assessment on 2/28) [id number] kCal, 130-140 grams of protein per day  Assessment: 44 YOF admitted on 03/27/15 with back and leg pain and underwent L5 laminectomy with facetectomy later that day.  Intraoperatively patient went into shock and CT revealed large left RP hematoma.  Patient returned to the OR for ex-lap with repair of left iliac vein, ligation of distal left  CIA, left CIA-CFA bypass, and placement of abdominal VAC.  Pharmacy consulted to manage TPN for nutritional support.  GI: hx GERD / obesity.  Baseline prealbumin low at 8.4 >> 9.7.  Frequent OR visits for abd washout (2/19, 2/21, 2/27) - too edematous for abd closure on 2/23, unable to close fascia over intestines on 2/27, s/p abdominal wall closure with mesh on 3/1. No flatus. Tolerated cyclic TPN on 3/3, no SOB reported by pt. Green vomit this am. NG/emesis O/P not recorded, drain O/P not recorded.  PPI IV, PO docusate, Reglan   Endo: no hx DM - hypoglycemic prior to TPN initiation, CBGs 99-203, SSI d/c'd 3/4 after CBG of 99 but now trending up again Lytes: K 4, Phos 3.5, Mg 2.1, Na 135 - no replacement needed today Renal: SCr stable, CrCL > 100 ml/min - good UOP 0.7 ml/kg/hr, NS at 10 ml/hr Pulm: extubated 3/2 on RA Cards: angina - BP ok, tachy (in ST) AC: Heparin for new left femoral DVT (dx 03/29/15) - Hb down to 6.5 - transfuse 1 unit PRBC, plts elevated Hepatobil: AST/ALT 70/116 << 108/135, tbili increased 4.9 (RN noted a little jaundice in sclera on 3/3).  TG 159>>240 - watch Neuro: morphine PCA, pain score 5-6, visual hallucinations this am ID: Tmax/24h: 99.4, WBC up to 32.7 << 30, off antibiotics  Best Practices: heparin gtt, MC  TPN Access: right IJ placed  2/17>>2/25, triple lumen PICC 2/25>> TPN start date: 2/21 >>  Plan:  - Cycle Clinimix E 5/15 over 14 hrs: 100 ml/hr x 1 hr, 203 ml/hr x 12 hr, 100 ml/hr x 1 hr (total volume 2640 ml) - Continue 14 units regular insulin in TPN - Will plan to cycle IVFE 20% on Tues and Thurs if tolerates - TPN  + IVFE will provide a weekly average of 2011 kCal and 132gm protein per day, exceeding kCal goal by 6% and meeting 100% of protein need. - Daily multivitamin and trace elements, watch tbili and monitor for jaundice - Add back moderate SSI at custom schedule to monitor cyclic TPN (5a, 9a, 4p, 8p) - spoke with RN about calling pharmacy if  concerned about CBGs but to continue SSI   [facility] [place].D., BCPS Clinical Pharmacist Pager: [phone] 04/12/2015 8:15 AM

## 2015-04-12 NOTE — Progress Notes (Signed)
Patient had visual hallucinations, stated "please put those children away in bed". Patient vomited green colored vomitus near the end of the shift. Patient received a bath, dressings changed. Patient is alert and oriented but removes blood pressure cuff every hour. PICC line is sluggish to flush and only one lumen has blood return. Hemoglobin was 6.7, type and screen sent down to the lab.

## 2015-04-12 NOTE — Progress Notes (Signed)
BP 117/61 mmHg  Pulse 117  Temp(Src) 97.8 F (36.6 C) (Oral)  Resp 26  Ht 5\' 3"  (1.6 m)  Wt 285 lb (129.275 kg)  BMI 50.50 kg/m2  SpO2 100%  LMP 03/27/2015 Alert, oriented x 4 Moving lower extremities Bleeding from incision, consulting general surgery Stable neurologically

## 2015-04-12 NOTE — Progress Notes (Signed)
4 Days Post-Op  Subjective: Awake and alert Comfortable No flatus yet  Objective: Vital signs in last 24 hours: Temp:  [98 F (36.7 C)-99.4 F (37.4 C)] 98.1 F (36.7 C) (03/05 0420) Pulse Rate:  [112-130] 122 (03/05 0400) Resp:  [20-32] 27 (03/05 0700) BP: (121-140)/(49-85) 128/52 mmHg (03/05 0700) SpO2:  [96 %-100 %] 99 % (03/05 0400) Weight:  [129.275 kg (285 lb)] 129.275 kg (285 lb) (03/05 0600) Last BM Date: 04/09/15  Intake/Output from previous day: 03/04 0701 - 03/05 0700 In: 3432 [I.V.:715; NG/GT:60; OZH:0865] Out: 2395 [Urine:2325; Emesis/NG output:50; Drains:20] Intake/Output this shift:    Abdomen soft, skin incision stable with staples intact  Lab Results:   Recent Labs  04/11/15 0543 04/12/15 0440  WBC 30.0* 32.7*  HGB 7.6* 6.5*  HCT 23.1* 20.2*  PLT 609* 658*   BMET  Recent Labs  04/10/15 0420 04/11/15 0543  NA 134* 135  K 4.4 4.0  CL 98* 101  CO2 28 26  GLUCOSE 200* 208*  BUN 19 24*  CREATININE 0.77 0.79  CALCIUM 8.3* 8.3*   PT/INR No results for input(s): LABPROT, INR in the last 72 hours. ABG No results for input(s): PHART, HCO3 in the last 72 hours.  Invalid input(s): PCO2, PO2  Studies/Results: No results found.  Anti-infectives: Anti-infectives    Start     Dose/Rate Route Frequency Ordered Stop   04/02/15 2330  vancomycin (VANCOCIN) IVPB 1000 mg/200 mL premix  Status:  Discontinued     1,000 mg 200 mL/hr over 60 Minutes Intravenous Every 12 hours 04/02/15 1015 04/05/15 0849   04/02/15 1830  piperacillin-tazobactam (ZOSYN) IVPB 3.375 g  Status:  Discontinued     3.375 g 12.5 mL/hr over 240 Minutes Intravenous 3 times per day 04/02/15 1015 04/09/15 0804   04/02/15 1100  cefUROXime (ZINACEF) 1.5 g in dextrose 5 % 50 mL IVPB  Status:  Discontinued     1.5 g 100 mL/hr over 30 Minutes Intravenous To ShortStay Surgical 04/01/15 1106 04/02/15 1448   04/02/15 1100  vancomycin (VANCOCIN) 2,500 mg in sodium chloride 0.9 % 500 mL  IVPB     2,500 mg 250 mL/hr over 120 Minutes Intravenous  Once 04/02/15 1029 04/02/15 1342   04/02/15 1030  vancomycin (VANCOCIN) 2,250 mg in sodium chloride 0.9 % 500 mL IVPB  Status:  Discontinued     2,250 mg 250 mL/hr over 120 Minutes Intravenous  Once 04/02/15 1015 04/02/15 1029   04/02/15 1030  piperacillin-tazobactam (ZOSYN) IVPB 3.375 g     3.375 g 100 mL/hr over 30 Minutes Intravenous  Once 04/02/15 1015 04/02/15 1212   03/27/15 2130  cefUROXime (ZINACEF) 1.5 g in dextrose 5 % 50 mL IVPB  Status:  Discontinued     1.5 g 100 mL/hr over 30 Minutes Intravenous Every 12 hours 03/27/15 2124 03/27/15 2132   03/27/15 1600  ceFAZolin (ANCEF) IVPB 2 g/50 mL premix     2 g 100 mL/hr over 30 Minutes Intravenous Every 8 hours 03/27/15 1322 03/28/15 0759   03/27/15 1447  ceFAZolin (ANCEF) 1-5 GM-% IVPB    Comments:  Merril Abbe   : cabinet override      03/27/15 1447 03/28/15 0259   03/27/15 0800  bacitracin 50,000 Units in sodium chloride irrigation 0.9 % 500 mL irrigation  Status:  Discontinued       As needed 03/27/15 0943 03/27/15 1223   03/27/15 0700  ceFAZolin (ANCEF) 3 g in dextrose 5 % 50 mL IVPB  3 g 130 mL/hr over 30 Minutes Intravenous To ShortStay Surgical 03/26/15 1409 03/27/15 0750      Assessment/Plan: s/p Procedure(s): EXPLORATORY LAPAROTOMY/ABD. WALL CLOSURE (N/A)  Continue npo and NG Will watch incision closely  LOS: 16 days    Kristina Estes A 04/12/2015

## 2015-04-12 NOTE — Progress Notes (Signed)
eLink Physician-Brief Progress Note Patient Name: Kristina BoschLaverne Hert DOB: 08/01/1970 MRN: [id number]   Date of Service  04/12/2015  HPI/Events of Note  Hbg drop from 7.6 to 6.5.   On heparin infusion.  HD stable  eICU Interventions  Plan: Transfuse 1 unit pRBC Continue heparin infusion for now Post-transfusion CBC     Intervention Category Intermediate Interventions: Other:  Manus Weedman 04/12/2015, 5:27 AM

## 2015-04-13 ENCOUNTER — Inpatient Hospital Stay (HOSPITAL_COMMUNITY): Payer: PRIVATE HEALTH INSURANCE

## 2015-04-13 ENCOUNTER — Encounter (HOSPITAL_COMMUNITY)
Admission: RE | Disposition: A | Discharge status: Skilled Nursing Facility | Payer: Self-pay | Source: Ambulatory Visit | Attending: Neurosurgery

## 2015-04-13 ENCOUNTER — Encounter (HOSPITAL_COMMUNITY): Payer: Self-pay | Admitting: *Deleted

## 2015-04-13 ENCOUNTER — Inpatient Hospital Stay (HOSPITAL_COMMUNITY): Payer: PRIVATE HEALTH INSURANCE | Admitting: Certified Registered"

## 2015-04-13 DIAGNOSIS — J9600 Acute respiratory failure, unspecified whether with hypoxia or hypercapnia: Secondary | ICD-10-CM | POA: Insufficient documentation

## 2015-04-13 DIAGNOSIS — J9811 Atelectasis: Secondary | ICD-10-CM | POA: Insufficient documentation

## 2015-04-13 DIAGNOSIS — S301XXA Contusion of abdominal wall, initial encounter: Secondary | ICD-10-CM | POA: Diagnosis present

## 2015-04-13 DIAGNOSIS — Z0189 Encounter for other specified special examinations: Secondary | ICD-10-CM | POA: Insufficient documentation

## 2015-04-13 HISTORY — PX: LAPAROTOMY: SHX154

## 2015-04-13 HISTORY — PX: APPLICATION OF WOUND VAC: SHX5189

## 2015-04-13 LAB — POCT I-STAT 7, (LYTES, BLD GAS, ICA,H+H)
ACID-BASE EXCESS: 1 mmol/L (ref 0.0–2.0)
BICARBONATE: 25.9 meq/L — AB (ref 20.0–24.0)
CALCIUM ION: 1.06 mmol/L — AB (ref 1.12–1.23)
HCT: 26 % — ABNORMAL LOW (ref 36.0–46.0)
HEMOGLOBIN: 8.8 g/dL — AB (ref 12.0–15.0)
O2 SAT: 100 %
Potassium: 5.4 mmol/L — ABNORMAL HIGH (ref 3.5–5.1)
SODIUM: 135 mmol/L (ref 135–145)
TCO2: 27 mmol/L (ref 0–100)
pCO2 arterial: 45 mmHg (ref 35.0–45.0)
pH, Arterial: 7.37 (ref 7.350–7.450)
pO2, Arterial: 525 mmHg — ABNORMAL HIGH (ref 80.0–100.0)

## 2015-04-13 LAB — GLUCOSE, CAPILLARY
GLUCOSE-CAPILLARY: 273 mg/dL — AB (ref 65–99)
GLUCOSE-CAPILLARY: 137 mg/dL — AB (ref 65–99)
GLUCOSE-CAPILLARY: 138 mg/dL — AB (ref 65–99)
GLUCOSE-CAPILLARY: 105 mg/dL — AB (ref 65–99)
Glucose-Capillary: 168 mg/dL — ABNORMAL HIGH (ref 65–99)
Glucose-Capillary: 209 mg/dL — ABNORMAL HIGH (ref 65–99)

## 2015-04-13 LAB — COMPREHENSIVE METABOLIC PANEL
ALT: 94 U/L — ABNORMAL HIGH (ref 14–54)
ANION GAP: 7 (ref 5–15)
AST: 53 U/L — AB (ref 15–41)
Albumin: 1.3 g/dL — ABNORMAL LOW (ref 3.5–5.0)
Alkaline Phosphatase: 106 U/L (ref 38–126)
BILIRUBIN TOTAL: 3.7 mg/dL — AB (ref 0.3–1.2)
BUN: 32 mg/dL — AB (ref 6–20)
CO2: 23 mmol/L (ref 22–32)
Calcium: 7.9 mg/dL — ABNORMAL LOW (ref 8.9–10.3)
Chloride: 105 mmol/L (ref 101–111)
Creatinine, Ser: 0.93 mg/dL (ref 0.44–1.00)
GFR calc Af Amer: >60 mL/min (ref >60)
Glucose, Bld: 296 mg/dL — ABNORMAL HIGH (ref 65–99)
POTASSIUM: 5 mmol/L (ref 3.5–5.1)
Sodium: 135 mmol/L (ref 135–145)
TOTAL PROTEIN: 5.4 g/dL — AB (ref 6.5–8.1)

## 2015-04-13 LAB — BASIC METABOLIC PANEL
ANION GAP: 10 (ref 5–15)
Anion gap: 9 (ref 5–15)
Anion gap: 10 (ref 5–15)
BUN: 34 mg/dL — ABNORMAL HIGH (ref 6–20)
BUN: 34 mg/dL — ABNORMAL HIGH (ref 6–20)
BUN: 32 mg/dL — ABNORMAL HIGH (ref 6–20)
CALCIUM: 8 mg/dL — AB (ref 8.9–10.3)
CALCIUM: 7.9 mg/dL — AB (ref 8.9–10.3)
CHLORIDE: 106 mmol/L (ref 101–111)
CO2: 23 mmol/L (ref 22–32)
CO2: 22 mmol/L (ref 22–32)
CO2: 21 mmol/L — ABNORMAL LOW (ref 22–32)
CREATININE: 1.03 mg/dL — AB (ref 0.44–1.00)
CREATININE: 1.01 mg/dL — AB (ref 0.44–1.00)
Calcium: 8.2 mg/dL — ABNORMAL LOW (ref 8.9–10.3)
Chloride: 105 mmol/L (ref 101–111)
Chloride: 107 mmol/L (ref 101–111)
Creatinine, Ser: 1.07 mg/dL — ABNORMAL HIGH (ref 0.44–1.00)
GFR calc Af Amer: >60 mL/min (ref >60)
GFR calc non Af Amer: >60 mL/min (ref >60)
GFR calc non Af Amer: >60 mL/min (ref >60)
GFR calc non Af Amer: >60 mL/min (ref >60)
GLUCOSE: 168 mg/dL — AB (ref 65–99)
Glucose, Bld: 184 mg/dL — ABNORMAL HIGH (ref 65–99)
Glucose, Bld: 254 mg/dL — ABNORMAL HIGH (ref 65–99)
POTASSIUM: 5.6 mmol/L — AB (ref 3.5–5.1)
Potassium: 5.1 mmol/L (ref 3.5–5.1)
Potassium: 5.1 mmol/L (ref 3.5–5.1)
SODIUM: 137 mmol/L (ref 135–145)
SODIUM: 138 mmol/L (ref 135–145)
Sodium: 138 mmol/L (ref 135–145)

## 2015-04-13 LAB — POCT I-STAT 3, ART BLOOD GAS (G3+)
BICARBONATE: 24.4 meq/L — AB (ref 20.0–24.0)
O2 Saturation: 100 %
PCO2 ART: 38.6 mmHg (ref 35.0–45.0)
PO2 ART: 172 mmHg — AB (ref 80.0–100.0)
Patient temperature: 97.9
TCO2: 26 mmol/L (ref 0–100)
pH, Arterial: 7.407 (ref 7.350–7.450)

## 2015-04-13 LAB — PROTIME-INR
INR: 1.34 (ref 0.00–1.49)
PROTHROMBIN TIME: 16.7 s — AB (ref 11.6–15.2)

## 2015-04-13 LAB — CBC
HCT: 20.8 % — ABNORMAL LOW (ref 36.0–46.0)
HEMATOCRIT: 17.7 % — AB (ref 36.0–46.0)
HEMATOCRIT: 25.1 % — AB (ref 36.0–46.0)
HEMOGLOBIN: 5.9 g/dL — AB (ref 12.0–15.0)
HEMOGLOBIN: 6.7 g/dL — AB (ref 12.0–15.0)
HEMOGLOBIN: 8.4 g/dL — AB (ref 12.0–15.0)
MCH: 30.1 pg (ref 26.0–34.0)
MCH: 28 pg (ref 26.0–34.0)
MCH: 28.6 pg (ref 26.0–34.0)
MCHC: 33.3 g/dL (ref 30.0–36.0)
MCHC: 32.2 g/dL (ref 30.0–36.0)
MCHC: 33.5 g/dL (ref 30.0–36.0)
MCV: 90.3 fL (ref 78.0–100.0)
MCV: 87 fL (ref 78.0–100.0)
MCV: 85.4 fL (ref 78.0–100.0)
Platelets: 553 10*3/uL — ABNORMAL HIGH (ref 150–400)
Platelets: 495 10*3/uL — ABNORMAL HIGH (ref 150–400)
Platelets: 443 10*3/uL — ABNORMAL HIGH (ref 150–400)
RBC: 1.96 MIL/uL — ABNORMAL LOW (ref 3.87–5.11)
RBC: 2.39 MIL/uL — AB (ref 3.87–5.11)
RBC: 2.94 MIL/uL — ABNORMAL LOW (ref 3.87–5.11)
RDW: 16.2 % — ABNORMAL HIGH (ref 11.5–15.5)
RDW: 15.9 % — ABNORMAL HIGH (ref 11.5–15.5)
RDW: 15.7 % — AB (ref 11.5–15.5)
WBC: 41.2 10*3/uL — ABNORMAL HIGH (ref 4.0–10.5)
WBC: 39.9 10*3/uL — ABNORMAL HIGH (ref 4.0–10.5)
WBC: 38.6 10*3/uL — AB (ref 4.0–10.5)

## 2015-04-13 LAB — MAGNESIUM: MAGNESIUM: 2 mg/dL (ref 1.7–2.4)

## 2015-04-13 LAB — TRIGLYCERIDES
Triglycerides: 194 mg/dL — ABNORMAL HIGH (ref <150)
Triglycerides: 318 mg/dL — ABNORMAL HIGH (ref <150)

## 2015-04-13 LAB — DIFFERENTIAL
BASOS ABS: 0.4 10*3/uL — AB (ref 0.0–0.1)
BASOS PCT: 1 %
EOS ABS: 0.4 10*3/uL (ref 0.0–0.7)
EOS PCT: 1 %
LYMPHS PCT: 13 %
Lymphs Abs: 5.4 10*3/uL — ABNORMAL HIGH (ref 0.7–4.0)
MONOS PCT: 7 %
Monocytes Absolute: 2.9 10*3/uL — ABNORMAL HIGH (ref 0.1–1.0)
NEUTROS ABS: 32.1 10*3/uL — AB (ref 1.7–7.7)
Neutrophils Relative %: 78 %

## 2015-04-13 LAB — PHOSPHORUS: Phosphorus: 3.9 mg/dL (ref 2.5–4.6)

## 2015-04-13 LAB — HEPARIN LEVEL (UNFRACTIONATED): Heparin Unfractionated: 0.75 IU/mL — ABNORMAL HIGH (ref 0.30–0.70)

## 2015-04-13 LAB — APTT: aPTT: 43 seconds — ABNORMAL HIGH (ref 24–37)

## 2015-04-13 LAB — LACTIC ACID, PLASMA: LACTIC ACID, VENOUS: 1.7 mmol/L (ref 0.5–2.0)

## 2015-04-13 LAB — PREPARE RBC (CROSSMATCH)

## 2015-04-13 LAB — PREALBUMIN: Prealbumin: 14.1 mg/dL — ABNORMAL LOW (ref 18–38)

## 2015-04-13 SURGERY — LAPAROTOMY, EXPLORATORY
Anesthesia: General | Site: Abdomen

## 2015-04-13 MED ORDER — PROPOFOL 1000 MG/100ML IV EMUL
INTRAVENOUS | Status: AC
  Administered 2015-04-13: 16:00:00
  Filled 2015-04-13: qty 100

## 2015-04-13 MED ORDER — VANCOMYCIN HCL 10 G IV SOLR
2500.0000 mg | Freq: Once | INTRAVENOUS | Status: AC
  Administered 2015-04-13: 2500 mg via INTRAVENOUS
  Filled 2015-04-13: qty 2500

## 2015-04-13 MED ORDER — IOHEXOL 300 MG/ML  SOLN: 100.0000 mL | Freq: Once | INTRAMUSCULAR | Status: DC | PRN

## 2015-04-13 MED ORDER — SODIUM CHLORIDE 0.9 % IV SOLN: Freq: Once | INTRAVENOUS | Status: DC

## 2015-04-13 MED ORDER — CEFAZOLIN SODIUM-DEXTROSE 2-3 GM-% IV SOLR
2.0000 g | Freq: Three times a day (TID) | INTRAVENOUS | Status: DC
  Filled 2015-04-13 (×2): qty 50

## 2015-04-13 MED ORDER — TRACE MINERALS CR-CU-MN-SE-ZN 10-1000-500-60 MCG/ML IV SOLN
INTRAVENOUS | Status: AC
  Administered 2015-04-13: 18:00:00 via INTRAVENOUS
  Filled 2015-04-13: qty 2640

## 2015-04-13 MED ORDER — PHENYLEPHRINE HCL 10 MG/ML IJ SOLN
10.0000 mg | INTRAVENOUS | Status: DC | PRN
  Administered 2015-04-13: 20 ug/min via INTRAVENOUS

## 2015-04-13 MED ORDER — SUCCINYLCHOLINE CHLORIDE 20 MG/ML IJ SOLN
INTRAMUSCULAR | Status: AC
  Filled 2015-04-13: qty 1

## 2015-04-13 MED ORDER — ROCURONIUM BROMIDE 50 MG/5ML IV SOLN
INTRAVENOUS | Status: AC
  Filled 2015-04-13: qty 1

## 2015-04-13 MED ORDER — INSULIN ASPART 100 UNIT/ML ~~LOC~~ SOLN
0.0000 [IU] | SUBCUTANEOUS | Status: DC
  Administered 2015-04-14 – 2015-04-15 (×5): 4 [IU] via SUBCUTANEOUS
  Administered 2015-04-16: 3 [IU] via SUBCUTANEOUS
  Administered 2015-04-16: 4 [IU] via SUBCUTANEOUS
  Administered 2015-04-16 (×2): 3 [IU] via SUBCUTANEOUS
  Administered 2015-04-17 (×2): 4 [IU] via SUBCUTANEOUS
  Administered 2015-04-17: 3 [IU] via SUBCUTANEOUS
  Administered 2015-04-17 – 2015-04-18 (×3): 4 [IU] via SUBCUTANEOUS
  Administered 2015-04-19 (×2): 3 [IU] via SUBCUTANEOUS
  Administered 2015-04-19: 4 [IU] via SUBCUTANEOUS
  Administered 2015-04-19 – 2015-04-20 (×4): 3 [IU] via SUBCUTANEOUS
  Administered 2015-04-20: 2 [IU] via SUBCUTANEOUS
  Administered 2015-04-20 – 2015-04-22 (×8): 3 [IU] via SUBCUTANEOUS
  Administered 2015-04-23 (×2): 4 [IU] via SUBCUTANEOUS

## 2015-04-13 MED ORDER — 0.9 % SODIUM CHLORIDE (POUR BTL) OPTIME
TOPICAL | Status: DC | PRN
  Administered 2015-04-13: 1000 mL
  Administered 2015-04-13: 3000 mL
  Administered 2015-04-13: 1000 mL

## 2015-04-13 MED ORDER — PROPOFOL 10 MG/ML IV BOLUS
INTRAVENOUS | Status: DC | PRN
  Administered 2015-04-13: 200 mg via INTRAVENOUS

## 2015-04-13 MED ORDER — ALBUMIN HUMAN 5 % IV SOLN
INTRAVENOUS | Status: DC | PRN
  Administered 2015-04-13 (×3): via INTRAVENOUS

## 2015-04-13 MED ORDER — TRACE MINERALS CR-CU-MN-SE-ZN 10-1000-500-60 MCG/ML IV SOLN
INTRAVENOUS | Status: DC
  Filled 2015-04-13: qty 2640

## 2015-04-13 MED ORDER — SODIUM CHLORIDE 0.9 % IV SOLN
500.0000 mg | Freq: Four times a day (QID) | INTRAVENOUS | Status: DC
  Administered 2015-04-13 – 2015-04-16 (×13): 500 mg via INTRAVENOUS
  Filled 2015-04-13 (×14): qty 500

## 2015-04-13 MED ORDER — SODIUM CHLORIDE 0.9 % IV SOLN
INTRAVENOUS | Status: DC
  Administered 2015-04-13 (×2): via INTRAVENOUS

## 2015-04-13 MED ORDER — FENTANYL CITRATE (PF) 250 MCG/5ML IJ SOLN
INTRAMUSCULAR | Status: AC
  Filled 2015-04-13: qty 5

## 2015-04-13 MED ORDER — SODIUM CHLORIDE 0.9 % IV SOLN
25.0000 ug/h | INTRAVENOUS | Status: DC
  Administered 2015-04-13 – 2015-04-15 (×3): 200 ug/h via INTRAVENOUS
  Filled 2015-04-13 (×7): qty 50

## 2015-04-13 MED ORDER — VANCOMYCIN HCL IN DEXTROSE 1-5 GM/200ML-% IV SOLN
1000.0000 mg | Freq: Two times a day (BID) | INTRAVENOUS | Status: DC
  Administered 2015-04-13 – 2015-04-16 (×6): 1000 mg via INTRAVENOUS
  Filled 2015-04-13 (×7): qty 200

## 2015-04-13 MED ORDER — LACTATED RINGERS IV SOLN
INTRAVENOUS | Status: DC
  Administered 2015-04-13 – 2015-04-14 (×3): via INTRAVENOUS

## 2015-04-13 MED ORDER — LOPERAMIDE HCL 1 MG/5ML PO LIQD: 4.0000 mg | Freq: Once | ORAL | Status: DC

## 2015-04-13 MED ORDER — LACTATED RINGERS IV SOLN
INTRAVENOUS | Status: DC | PRN
  Administered 2015-04-13: 14:00:00 via INTRAVENOUS

## 2015-04-13 MED ORDER — LIDOCAINE HCL (CARDIAC) 20 MG/ML IV SOLN
INTRAVENOUS | Status: DC | PRN
Start: 2015-04-13 — End: 2015-04-13
  Administered 2015-04-13: 60 mg via INTRAVENOUS

## 2015-04-13 MED ORDER — ARTIFICIAL TEARS OP OINT
TOPICAL_OINTMENT | OPHTHALMIC | Status: DC | PRN
  Administered 2015-04-13: 1 via OPHTHALMIC

## 2015-04-13 MED ORDER — PANTOPRAZOLE SODIUM 40 MG IV SOLR
40.0000 mg | INTRAVENOUS | Status: DC
  Administered 2015-04-13 – 2015-04-22 (×10): 40 mg via INTRAVENOUS
  Filled 2015-04-13 (×10): qty 40

## 2015-04-13 MED ORDER — PROPOFOL 1000 MG/100ML IV EMUL
5.0000 ug/kg/min | INTRAVENOUS | Status: DC
  Administered 2015-04-13: 50 ug/kg/min via INTRAVENOUS
  Administered 2015-04-13: 70 ug/kg/min via INTRAVENOUS
  Administered 2015-04-13 (×2): 60 ug/kg/min via INTRAVENOUS
  Administered 2015-04-14: 30 ug/kg/min via INTRAVENOUS
  Administered 2015-04-14: 40 ug/kg/min via INTRAVENOUS
  Administered 2015-04-14 – 2015-04-15 (×7): 30 ug/kg/min via INTRAVENOUS
  Filled 2015-04-13 (×14): qty 100

## 2015-04-13 MED ORDER — FENTANYL CITRATE (PF) 100 MCG/2ML IJ SOLN
INTRAMUSCULAR | Status: DC | PRN
  Administered 2015-04-13 (×3): 100 ug via INTRAVENOUS
  Administered 2015-04-13 (×2): 50 ug via INTRAVENOUS
  Administered 2015-04-13: 100 ug via INTRAVENOUS

## 2015-04-13 MED ORDER — LIDOCAINE HCL (CARDIAC) 20 MG/ML IV SOLN
INTRAVENOUS | Status: AC
  Filled 2015-04-13: qty 5

## 2015-04-13 MED ORDER — ROCURONIUM BROMIDE 100 MG/10ML IV SOLN
INTRAVENOUS | Status: DC | PRN
  Administered 2015-04-13: 20 mg via INTRAVENOUS
  Administered 2015-04-13: 30 mg via INTRAVENOUS
  Administered 2015-04-13: 50 mg via INTRAVENOUS

## 2015-04-13 SURGICAL SUPPLY — 51 items
BLADE SURG ROTATE 9660 (MISCELLANEOUS) IMPLANT
CANISTER SUCTION 2500CC (MISCELLANEOUS) ×4 IMPLANT
CANISTER WOUND CARE 500ML ATS (WOUND CARE) ×4 IMPLANT
CHLORAPREP W/TINT 26ML (MISCELLANEOUS) ×4 IMPLANT
COVER SURGICAL LIGHT HANDLE (MISCELLANEOUS) ×4 IMPLANT
DRAPE LAPAROSCOPIC ABDOMINAL (DRAPES) ×8 IMPLANT
DRAPE WARM FLUID 44X44 (DRAPE) ×4 IMPLANT
DRSG OPSITE POSTOP 4X10 (GAUZE/BANDAGES/DRESSINGS) IMPLANT
DRSG OPSITE POSTOP 4X8 (GAUZE/BANDAGES/DRESSINGS) IMPLANT
DRSG VAC ATS LRG SENSATRAC (GAUZE/BANDAGES/DRESSINGS) ×4 IMPLANT
ELECT BLADE 6.5 EXT (BLADE) IMPLANT
ELECT CAUTERY BLADE 6.4 (BLADE) ×4 IMPLANT
ELECT REM PT RETURN 9FT ADLT (ELECTROSURGICAL) ×4
ELECTRODE REM PT RTRN 9FT ADLT (ELECTROSURGICAL) ×2 IMPLANT
GLOVE BIO SURGEON STRL SZ7 (GLOVE) ×8 IMPLANT
GLOVE BIOGEL PI IND STRL 7.0 (GLOVE) ×6 IMPLANT
GLOVE BIOGEL PI INDICATOR 7.0 (GLOVE) ×6
GLOVE EUDERMIC 7 POWDERFREE (GLOVE) ×12 IMPLANT
GLOVE SURG SS PI 6.5 STRL IVOR (GLOVE) ×4 IMPLANT
GLOVE SURG SS PI 7.0 STRL IVOR (GLOVE) ×4 IMPLANT
GOWN STRL REUS W/ TWL LRG LVL3 (GOWN DISPOSABLE) ×6 IMPLANT
GOWN STRL REUS W/ TWL XL LVL3 (GOWN DISPOSABLE) ×2 IMPLANT
GOWN STRL REUS W/TWL LRG LVL3 (GOWN DISPOSABLE) ×6
GOWN STRL REUS W/TWL XL LVL3 (GOWN DISPOSABLE) ×2
HANDPIECE INTERPULSE COAX TIP (DISPOSABLE) ×2
HOVERMATT SINGLE USE (MISCELLANEOUS) ×4 IMPLANT
KIT BASIN OR (CUSTOM PROCEDURE TRAY) ×4 IMPLANT
KIT REMOVER STAPLE SKIN (MISCELLANEOUS) ×4 IMPLANT
KIT ROOM TURNOVER OR (KITS) ×4 IMPLANT
LIGASURE IMPACT 36 18CM CVD LR (INSTRUMENTS) IMPLANT
NS IRRIG 1000ML POUR BTL (IV SOLUTION) ×8 IMPLANT
PACK GENERAL/GYN (CUSTOM PROCEDURE TRAY) ×4 IMPLANT
PAD ARMBOARD 7.5X6 YLW CONV (MISCELLANEOUS) ×4 IMPLANT
SET HNDPC FAN SPRY TIP SCT (DISPOSABLE) ×2 IMPLANT
SPECIMEN JAR LARGE (MISCELLANEOUS) IMPLANT
SPONGE ABDOMINAL VAC ABTHERA (MISCELLANEOUS) ×4 IMPLANT
SPONGE LAP 18X18 X RAY DECT (DISPOSABLE) ×12 IMPLANT
STAPLER VISISTAT 35W (STAPLE) ×4 IMPLANT
SUCTION POOLE TIP (SUCTIONS) ×4 IMPLANT
SUT PDS AB 1 TP1 96 (SUTURE) ×8 IMPLANT
SUT SILK 2 0 SH CR/8 (SUTURE) ×4 IMPLANT
SUT SILK 2 0 TIES 10X30 (SUTURE) ×4 IMPLANT
SUT SILK 3 0 SH CR/8 (SUTURE) ×4 IMPLANT
SUT SILK 3 0 TIES 10X30 (SUTURE) ×4 IMPLANT
SUT VIC AB 3-0 SH 18 (SUTURE) ×4 IMPLANT
SWAB COLLECTION DEVICE MRSA (MISCELLANEOUS) ×4 IMPLANT
SWAB CULTURE ESWAB REG 1ML (MISCELLANEOUS) ×4 IMPLANT
TOWEL OR 17X24 6PK STRL BLUE (TOWEL DISPOSABLE) ×12 IMPLANT
TOWEL OR 17X26 10 PK STRL BLUE (TOWEL DISPOSABLE) ×4 IMPLANT
TRAY FOLEY CATH 16FRSI W/METER (SET/KITS/TRAYS/PACK) IMPLANT
YANKAUER SUCT BULB TIP NO VENT (SUCTIONS) IMPLANT

## 2015-04-13 NOTE — Progress Notes (Signed)
eLink Physician-Brief Progress Note Patient Name: Kristina Estes DOB: 06/07/1970 MRN: [id number]   Date of Service  04/13/2015  HPI/Events of Note  Camera check on patient postoperative evacuation of hematoma on heparin infusion. Patient with known left common femoral vein DVT. Per note from vascular surgery plan for IVC filter tomorrow. Patient hemodynamically stable on camera. Nurse at bedside.  eICU Interventions  1. Remove left lower extremity SCD. 2. Plan to follow up CBC ordered now.     Intervention Category Intermediate Interventions: Other:  Lawanda CousinsJennings Ky Moskowitz 04/13/2015, 3:58 PM

## 2015-04-13 NOTE — Transfer of Care (Signed)
Immediate Anesthesia Transfer of Care Note  Patient: Kristina Estes  Procedure(s) Performed: Procedure(s): EXPLORATORY LAPAROTOMY (N/A) APPLICATION OF WOUND VAC (N/A)  Patient Location: SICU  Anesthesia Type:General  Level of Consciousness: Patient remains intubated per anesthesia plan  Airway & Oxygen Therapy: Patient remains intubated per anesthesia plan  Post-op Assessment: Report given to RN and Post -op Vital signs reviewed and stable  Post vital signs: Reviewed and stable  Last Vitals:  Filed Vitals:   04/13/15 1215 04/13/15 1225  BP: 103/33 105/47  Pulse: 128 124  Temp: 36.5 C 36.5 C  Resp: 27     Complications: No apparent anesthesia complications

## 2015-04-13 NOTE — Progress Notes (Signed)
Pt required return to OR today for exploration, evac of subq abd wall hematoma. Facial repair was found to be intact. She returned intubated, currently sedated on propofol/fentanyl. Will cont to defer current mgmt to surgery. I suspect it will be quite some time before she could tolerate a prone surgery. I therefore will plan on obtaining upright L-spine XR in the near future. If this does not show significant mobility at L5-S1, I will likely allow her to be mobilized into a chair when possible. I spoke with her family, all questions were answered.

## 2015-04-13 NOTE — Progress Notes (Signed)
PARENTERAL NUTRITION CONSULT NOTE - FOLLOW UP  Pharmacy Consult:  TPN Indication:  Open abdomen s/p closure  Allergies  Allergen Reactions  . Propoxyphene Other (See Comments)    Unspecified    Patient Measurements: Height: 5\' 3"  (160 cm) Weight: 285 lb (129.275 kg) IBW/kg (Calculated) : 52.4  BMI = 59.7  Vital Signs: Temp: 98.4 F (36.9 C) (03/06 0700) Temp Source: Oral (03/06 0700) BP: 118/85 mmHg (03/06 0700) Pulse Rate: 135 (03/06 0643) Intake/Output from previous day: 03/05 0701 - 03/06 0700 In: 4027.2 [I.V.:911.3; Blood:402.9; NG/GT:120; TPN:2593] Out: 3560 [Urine:2065; Emesis/NG output:1495]  Labs:  Recent Labs  04/11/15 0543 04/12/15 0440 04/13/15 0456  WBC 30.0* 32.7* 41.2*  HGB 7.6* 6.5* 5.9*  HCT 23.1* 20.2* 17.7*  PLT 609* 658* 553*     Recent Labs  04/11/15 0543 04/13/15 0456  NA 135 135  K 4.0 5.0  CL 101 105  CO2 26 23  GLUCOSE 208* 296*  BUN 24* 32*  CREATININE 0.79 0.93  CALCIUM 8.3* 7.9*  MG  --  2.0  PHOS  --  3.9  PROT  --  5.4*  ALBUMIN  --  1.3*  AST  --  53*  ALT  --  94*  ALKPHOS  --  106  BILITOT  --  3.7*  PREALBUMIN  --  14.1*  TRIG  --  194*   Estimated Creatinine Clearance: 100.3 mL/min (by C-G formula based on Cr of 0.93).    Recent Labs  04/12/15 2006 04/13/15 0014 04/13/15 0404  GLUCAP 199* 209* 273*     Insulin Requirements in the past 24 hours:  16 units of SSI  Assessment: 44 YOF admitted on 03/27/15 with back and leg pain and underwent L5 laminectomy with facetectomy later that day.  Intraoperatively patient went into shock and CT revealed large left RP hematoma.  Patient returned to the OR for ex-lap with repair of left iliac vein, ligation of distal left CIA, left CIA-CFA bypass, and placement of abdominal VAC.  Pharmacy consulted to manage TPN for nutritional support.  GI: hx GERD / obesity. Albumin low at 1.3. Baseline prealbumin low at 8.4, now up to 14.1. Frequent OR visits for abd washout  (2/19, 2/21, 2/27) - too edematous for abd closure on 2/23, unable to close fascia over intestines on 2/27, s/p abdominal wall closure with mesh on 3/1. Tolerating cyclic TPN. Drain O/P not recorded but surgery says blood output from llq and rlq drain. May need to have mesh repair explored with bleeding. Still having N/V despite functioning NGT. About 1500ml of output. PPI IV, PO docusate, Reglan    Endo: no hx DM - hypoglycemic prior to TPN initiation, SSI d/c'd 3/4 by RN and then CBGs became uncontrolled. SSI added back on. CBGs today are elevated (190-270s on TPN and 120-130s off TPN) Will try to control CBGs before advancing cycle to 12 hrs.  Lytes: wnl (K up to 5.0) Phos and Mg ok. CoCa 10.1. CaxPhos < 55  Renal: SCr stable, CrCL > 100 ml/min - good UOP 0.7 ml/kg/hr, NS at 10 ml/hr  Pulm: extubated 3/2 on RA  Cards: angina - BP ok, tachy (in ST)  AC: Holding heparin for new left femoral DVT (dx 03/29/15) due to bleeding - Hb down to 5.9, plts elevated  Hepatobil: LFTs trending down, Tbili down to 3.7. (RN noted a little jaundice in sclera on 3/3, seems to have no jaundice now). TG back down to 194 from 240.  Neuro: morphine  PCA, pain score 5-6, visual hallucinations this am  ID: Afebrile, WBC up to 41.2, off antibiotics   Best Practices: Holding heparin gtt, MC  TPN Access: right IJ placed 2/17>>2/25, triple lumen PICC 2/25>>  TPN start date: 2/21 >>  Current Nutrition:  Clinimix E 5/15 cycle over 14 hrs: 167ml/hr x 1 hr, then 203 ml/hr x 12 hrs, and 100 ml/hr x 1 hr (Total volume of 2640 ml) 20% IVFE at 10 ml/hr on Tues and Thurs only to minimize kCal provision TPN will provide a daily average of 2011 kCal and 132 g protein per day, meeting 100% of protein and kcal needs  Nutritional Goals: (per RD assessment on 2/28) Kcal: 9629-5284 Protein: 130-140 g  Plan:  Continue Clinimix E 5/15 cycle over 14 hrs: 133ml/hr x 1 hr, then 203 ml/hr x 12 hrs, and 100 ml/hr x 1 hr (Total  volume of 2640 ml) Continue 20% IVFE cycle at 77ml/hr on Tues and Thurs Continue NS at [facility] This provides a daily average of 132 g of protein and 2011 kcals meeting 100% of protein needs and over 100% of kcal needs Continue MVI and trace elements in TPN Increase to 25 units of regular insulin in TPN Continue moderate SSI and adjust as needed Monitor TPN labs, BMet tomorrow  Enzo Bi, PharmD, BCPS Clinical Pharmacist Pager [phone] 04/13/2015 7:54 AM

## 2015-04-13 NOTE — Progress Notes (Addendum)
Daily Progress Note  Radiology: Ct Abdomen Pelvis W Contrast  04/13/2015  CLINICAL DATA:  Abdominal swelling. Abdominal mesh placed following the anterior repair of the iliac artery injury. EXAM: CT ABDOMEN AND PELVIS WITH CONTRAST TECHNIQUE: Multidetector CT imaging of the abdomen and pelvis was performed using the standard protocol following bolus administration of intravenous contrast. CONTRAST:  100 cc Omnipaque COMPARISON:  CT 03/27/2015 FINDINGS: Lower chest: Lung bases are clear. Hepatobiliary: No focal hepatic lesion. Postcholecystectomy. No biliary dilatation. Pancreas: Pancreas is normal. No ductal dilatation. No pancreatic inflammation. Spleen: Normal spleen Adrenals/urinary tract: Adrenal glands and kidneys are normal. The ureters and bladder normal. Stomach/Bowel: NG tube in the stomach. Loops of small bowel and colon are decompressed. The small bowel appears to remain within the peritoneal space without clear evidence of herniation into the ventral abdominal complex fluid collection. Along the most inferior margin of the fluid collection difficult to completely exclude bowel entering the fluid collection. Colon appears normal. No intraperitoneal free air. Vascular/Lymphatic: Abdominal aorta is normal caliber. The iliac arteries perfuse normally. Interval reduction in volume of hematoma along the LEFT iliac vessels measuring 5.3 by 5.4 cm in axial dimension compared to 9.9 x 7.4 cm. There is poor opacification of the LEFT common femoral vein and LEFT external and common iliac vein (images 91 through 75 of series 201). Reproductive: Uterus and ovaries are normal. Other: Large ventral complex fluid collection is new from prior. Collection measures 17 cm x 11 cm by 32 cm for a calculated volume estimation of 3000 cubic cm. There surgical drain in the most ventral (superficial) aspect of the fluid collection. There are a complex loculations within the collection. There is a hematocrit effect  superiorly within the collection with a fluid fluid level on image 27, series 201. Scattered gas within the collection is felt to relate to infection not bowel perforation. The surgical mesh is not identified. Musculoskeletal: No aggressive osseous lesion. IMPRESSION: 1. Large complex ventral fluid collection with interspersed gas is is favored to represent a large postsurgical abscess / hematoma with complex internal loculations. Volume estimation of 3000 cubic cm. 2. Small bowel bowel approximates the ventral peritoneal surface and may enter the most caudal aspect of the fluid collection but is not felt to occupy a significant portion of the large fluid collection. 3. Surgical drains within the ventral portion of the fluid collection. 4. Interval reduction in LEFT retroperitoneal pelvic hematoma. 5. No evidence of bowel obstruction. 6. Deep venous thrombosis within the LEFT common femoral vein. Findings conveyed Pura Spice, MDon 04/13/2015  [id number]. Electronically Signed   By: Genevive Bi M.D.   On: 04/13/2015 09:22   Dg Abd Portable 1v  04/13/2015  CLINICAL DATA:  NG tube placement. EXAM: PORTABLE ABDOMEN - 1 VIEW COMPARISON:  04/10/2015 FINDINGS: Enteric tube tip is present in the left upper quadrant consistent with location in the body of the stomach. Skin clips along the midline. Surgical clips in the right upper quadrant. IMPRESSION: Enteric tube tip localizes to the left upper quadrant consistent with location in the body of the stomach. Electronically Signed   By: Burman Nieves M.D.   On: 04/13/2015 01:24   - CT looks like bleeding from anterior abdominal source - will have to stop heparin drip - Give proximal DVT, will need IVC filter - Would not place the filter today, given the uncertainty of what today's operation will involved.  Will plan on IVC filter placement tomorrow   Leonides Sake, MD [facility] Office: [phone] Pager:  [phone]  04/13/2015, 10:49 AM

## 2015-04-13 NOTE — Progress Notes (Signed)
Dr Magnus IvanBlackman notified of pt abd incision continuing to drain large amounts of serosanguineous fluid and increase in swelling around abd incision. Notified of Hgb 5.9 and WBC 41.2, order received to transfuse 2 unit PRBC.

## 2015-04-13 NOTE — Anesthesia Preprocedure Evaluation (Addendum)
Anesthesia Evaluation  Patient identified by MRN, date of birth, ID band Patient unresponsive    Reviewed: Allergy & Precautions, NPO status , Patient's Chart, lab work & pertinent test results, Unable to perform ROS - Chart review only  History of Anesthesia Complications Negative for: history of anesthetic complications  Airway Mallampati: III  TM Distance: >3 FB Neck ROM: Full    Dental  (+) Teeth Intact   Pulmonary asthma ,  VDRF s/p vascular complication during back surgery   breath sounds clear to auscultation       Cardiovascular Exercise Tolerance: Good hypertension, Pt. on medications (-) angina+ dysrhythmias  Rhythm:Regular Rate:Tachycardia  Recent hypovolemic shock, now s/p resusc and transfusion, hemodynamically stable and off of pressors currently   Neuro/Psych Remains sedated and ventilated Chronic back pain negative neurological ROS  negative psych ROS   GI/Hepatic Neg liver ROS, GERD  Controlled,Large complex ventral fluid collection with interspersed gas is favored to represent a large postsurgical abscess / hematoma    Endo/Other  Morbid obesity  Renal/GU negative Renal ROS  negative genitourinary   Musculoskeletal  (+) Arthritis ,   Abdominal (+) + obese,   Peds  Hematology  (+) Blood dyscrasia (Hb 7.8), anemia ,   Anesthesia Other Findings   Reproductive/Obstetrics                             Anesthesia Physical  Anesthesia Plan  ASA: IV  Anesthesia Plan: General   Post-op Pain Management:    Induction: Inhalational  Airway Management Planned: Oral ETT and Video Laryngoscope Planned  Additional Equipment: Arterial line and CVP  Intra-op Plan:   Post-operative Plan: Post-operative intubation/ventilation  Informed Consent: I have reviewed the patients History and Physical, chart, labs and discussed the procedure including the risks, benefits and  alternatives for the proposed anesthesia with the patient or authorized representative who has indicated his/her understanding and acceptance.   Dental advisory given  Plan Discussed with: CRNA, Surgeon and Anesthesiologist  Anesthesia Plan Comments: (Plan A-line, GETA, Post op ventilation. )       Anesthesia Quick Evaluation

## 2015-04-13 NOTE — Anesthesia Procedure Notes (Signed)
Procedure Name: Intubation Date/Time: 04/13/2015 1:58 PM Performed by: Lanell MatarBAKER, Romone Shaff M Pre-anesthesia Checklist: Timeout performed, Patient identified, Emergency Drugs available, Suction available and Patient being monitored Patient Re-evaluated:Patient Re-evaluated prior to inductionOxygen Delivery Method: Circle system utilized Preoxygenation: Pre-oxygenation with 100% oxygen Intubation Type: IV induction Ventilation: Mask ventilation without difficulty and Oral airway inserted - appropriate to patient size Laryngoscope Size: Glidescope and 4 Grade View: Grade I Tube type: Subglottic suction tube Tube size: 7.5 mm Number of attempts: 1 Airway Equipment and Method: Stylet and Video-laryngoscopy Placement Confirmation: ETT inserted through vocal cords under direct vision,  breath sounds checked- equal and bilateral,  positive ETCO2 and CO2 detector Secured at: 22 cm Tube secured with: Tape Dental Injury: Teeth and Oropharynx as per pre-operative assessment

## 2015-04-13 NOTE — Progress Notes (Signed)
   Daily Progress Note  Assessment/Planning: S/p x-lap, Repair L CIV, Ligation distal L CIA, L CIA to CFA bypass, s/p multiple abd vac changes x 4 (03/27/15) S/p Abd closure with mesh (04/08/15)   Sudden drop in H/H along with increase in JP output with clot and frank blood in JP suggestive of possible dehiscence and bleeding related to mesh repair  Bleeding from vascular repair (repair of L CIV, suture ligation of distal L CIA, L CIA to CFA bypass) also possible, though it would unusual to develop in a delayed fashion 17 days postop  Can remove L groin staples at this point.  Will reinforce with Dermabond.  Extremely elevated WBC also concerning.  CT abd/pelvis ordered.   I suspect this patient's abd will need to be open and the mesh repair explored.  Will defer to General Surgery   Subjective  - 5 Days Post-Op  Nauseated, increase JP output with clot and blood reported by nursing   Objective Filed Vitals:   04/13/15 0600 04/13/15 0630 04/13/15 0643 04/13/15 0700  BP: 118/71 118/71  118/85  Pulse: 135 138 135   Temp:  98.8 F (37.1 C)  98.4 F (36.9 C)  TempSrc:  Oral  Oral  Resp: 24 29 29 29   Height:      Weight:      SpO2: 100% 100% 100% 100%    Intake/Output Summary (Last 24 hours) at 04/13/15 0757 Last data filed at 04/13/15 0700  Gross per 24 hour  Intake 4027.17 ml  Output   3560 ml  Net 467.17 ml   GI  soft, appropriately tender, abd inc bandage, contour of abd appears more protrubant VASC  L foot warm, L groin clean  Laboratory CBC    Component Value Date/Time   WBC 41.2* 04/13/2015 0456   HGB 5.9* 04/13/2015 0456   HCT 17.7* 04/13/2015 0456   PLT 553* 04/13/2015 0456    BMET    Component Value Date/Time   NA 135 04/13/2015 0456   K 5.0 04/13/2015 0456   CL 105 04/13/2015 0456   CO2 23 04/13/2015 0456   GLUCOSE 296* 04/13/2015 0456   BUN 32* 04/13/2015 0456   CREATININE 0.93 04/13/2015 0456   CALCIUM 7.9* 04/13/2015 0456   GFRNONAA >60  04/13/2015 0456   GFRAA >60 04/13/2015 0456    Leonides SakeBrian Carey Johndrow, MD [facility] Office: [phone] Pager: [phone]  04/13/2015, 7:57 AM

## 2015-04-13 NOTE — Progress Notes (Signed)
PULMONARY / CRITICAL CARE MEDICINE   Name: Yatzary Merriweather MRN: [id number] DOB: [birth year]    ADMISSION DATE:  03/27/2015  CONSULTATION DATE: 03/27/15  REFERRING MD: Conchita Paris  CHIEF COMPLAINT: Hemorrhagic shock  SUBJECTIVE:  OR trip back planned, cbc drop  VITAL SIGNS: BP 103/84 mmHg  Pulse 132  Temp(Src) 98.1 F (36.7 C) (Oral)  Resp 18  Ht  (1.6 m)  Wt 129.275 kg (285 lb)  BMI 50.50 kg/m2  SpO2 100%  LMP 03/27/2015  VENTILATOR SETTINGS:    INTAKE / OUTPUT: I/O last 3 completed shifts: In: 6574.7 [I.V.:1268.8; Blood:402.9; NG/GT:180] Out: 4755 [Urine:3190; Emesis/NG output:1545; Drains:20]   PHYSICAL EXAMINATION: General: pleasant Neuro: follows commands HEENT: no stridor Cardiac: s1 s2 rrt tachy Chest: redeuced  No distress Abd: abd binder in place, distended, tender Ext: 1+ edema Skin: no rashes   LABS:  BMET  Recent Labs Lab 04/10/15 0420 04/11/15 0543 04/13/15 0456  NA 134* 135 135  K 4.4 4.0 5.0  CL 98* 101 105  CO2 BUN 19 24* 32*  CREATININE 0.77 0.79 0.93  GLUCOSE 200* 208* 296*    Electrolytes  Recent Labs Lab 04/07/15 1520 04/08/15 0415  04/09/15 0500  04/10/15 0420 04/11/15 0543 04/13/15 0456  CALCIUM 8.3* 8.5*  < > 8.5*  < > 8.3* 8.3* 7.9*  MG  --  2.1  --  2.1  --   --   --  2.0  PHOS 3.2  --   --  3.5  --   --   --  3.9  < > = values in this interval not displayed.  CBC  Recent Labs Lab 04/11/15 0543 04/12/15 0440 04/13/15 0456  WBC 30.0* 32.7* 41.2*  HGB 7.6* 6.5* 5.9*  HCT 23.1* 20.2* 17.7*  PLT 609* 658* 553*    Liver Enzymes  Recent Labs Lab 04/08/15 1923 04/09/15 0500 04/13/15 0456  AST 108* 70* 53*  ALT 135* 116* 94*  ALKPHOS 134* 122 106  BILITOT 6.1* 4.9* 3.7*  ALBUMIN 1.6* 1.5* 1.3*   Glucose  Recent Labs Lab 04/12/15 0809 04/12/15 1104 04/12/15 1649 04/12/15 2006 04/13/15 0014 04/13/15 0404  GLUCAP 188* 131* 127* 199* 209* 273*    Imaging Dg Abd Portable  1v  04/13/2015  CLINICAL DATA:  NG tube placement. EXAM: PORTABLE ABDOMEN - 1 VIEW COMPARISON:  04/10/2015 FINDINGS: Enteric tube tip is present in the left upper quadrant consistent with location in the body of the stomach. Skin clips along the midline. Surgical clips in the right upper quadrant. IMPRESSION: Enteric tube tip localizes to the left upper quadrant consistent with location in the body of the stomach. Electronically Signed   By: Burman Nieves M.D.   On: 04/13/2015 01:24     STUDIES:  2/17 CT abd/pelvis >>Lt sided retroperitoneal hematoma 2/19 Doppler b/l legs >> DVT Lt common femoral vein 3/6 ct abdo>>>Large complex ventral fluid collection with interspersed gas is is favored to represent a large postsurgical abscess / hematoma with complex internal loculations. Volume estimation of 3000 cubic cm. 2. Small bowel bowel approximates the ventral peritoneal surface and may enter the most caudal aspect of the fluid collection but is not felt to occupy a significant portion of the large fluid collection. 3. Surgical drains within the ventral portion of the fluid collection.  CULTURES: 2/23 Blood >> negative 2/24 Sputum >> Few candida 3/6 OR>>>  ANTIBIOTICS: 2/23 Vancomycin >> 2/26 2/23 Zosyn >> 3/02 3/6 imipenem >>> 3/6 vanc>>>  SIGNIFICANT  EVENTS: 2/17 L5 laminectomy >> retroperitoneal hemorrhage; vascular surgery consulted >> Lt common iliac to Lt common femoral bypass 2/19 abdominal washout, wound vac; DVT Lt leg >> start heparin gtt 2/20 General surgery consulted 2/21 To OR 2/23 To OR 2/27 To OR 3/01 To OR >> abdominal wall closure with mesh 3/6- abdo distention, hct drop, to OR now for CT findings abdo collection that loks complicated  LINES/TUBES: 2/17 IJ CVL >> 2/25 2/17 ETT >> 3/02 2/25 Rt PICC >>   DISCUSSION: 45 yo female with back and leg pain from L5-S1 spondylolisthesis.  She had L5 laminectomy on 2/17 >> c/b retroperitoneal hemorrhage.  She required  laparotomy with Lt common iliac to common femoral bypass.  Her abdomen has remained open due to edema, and she has required repeated trips to the OR.  She remains on the vent for OR trips.  She has hx of Asthma, GERD.  ASSESSMENT / PLAN:  PULMONARY A: Acute respiratory failure in setting of retroperitoneal hemorrhage. P:  Consider ett back to ICU , we can recover her and wean her then as indicated aill need abg follow pcxr with small lung volumes, repeat post op  CARDIOVASCULAR A: Hemorrhagic shock 2nd to retroperitoneal bleeding >> resolved. Lt leg DVT noted 2/19. P: Will need filter likely Dc hep drip Tx asap for tachy If drops BP get cortisol  RENAL A:  Mild K rise, at risk rise with prbc P:  k repeat q8h Npo Follow up lft  GASTROINTESTINAL A:  S/p laparotomy with difficulty closing abdominal wound due to edema. Nutrition. Large abdo wound fluid collection, hematoma infected? P: To OR TNA Protonix for SUP  HEMATOLOGIC A:  Anemia 2nd to acute blood loss, and critical illness. Leukocytosis from abdo colllection P:  F/u CBC intermittently Transfuse for Hb < 7 2 unit prbc, post op cbc Send stat coags  INFECTIOUS A:  HCAP >> completed ABx 3/02. abdo wound infection presumed infected hematoma? P:  Add back stat imi, vanc Had long course zosyn Low threshold add empiric antifungal  ENDOCRINE A:  Hyperglycemia. P:  SSI   NEUROLOGIC A:  L5-S1 spondylolisthesis. Sedation. P:  Re assess post op pain , vent needs   Ccm time 35 min  Updated family  Mcarthur RossettiDaniel J. Tyson AliasFeinstein, MD, FACP Pgr: [phone] [facility]

## 2015-04-13 NOTE — Progress Notes (Signed)
Pharmacy Antibiotic Note  Kristina Estes is a 45 y.o. female admitted on 03/27/2015 with intra-abdominal infection. Possibly septic from infected hematoma vs. hematoma superimposed on bowel perforation. Previously on vanc 2/23-2/26. Ok to re-load. Pharmacy has been consulted for vanc/imipenem dosing. No history of seizures noted. Patient is also noted to be on TPN. SCr slight bump to 0.93, normalized CrCl~80.  AF, WBC trending up 39.9. No new cultures ordered.  Plan: Imipenem 500mg  IV q6h Vanc 2500mg  IV x1; then Vanc 1g IV q12h. Goal trough 15-20 Monitor clinical progress, c/s, renal function, abx plan/LOT VT@SS  as indicated  Height: 5\' 3"  (160 cm) Weight: 285 lb (129.275 kg) IBW/kg (Calculated) : 52.4  Temp (24hrs), Avg:98.4 F (36.9 C), Min:97.5 F (36.4 C), Max:99.4 F (37.4 C)   Recent Labs Lab 04/09/15 0500 04/09/15 1745 04/10/15 0420 04/11/15 0543 04/12/15 0440 04/13/15 0456 04/13/15 0910  WBC 22.8* 27.9*  --  30.0* 32.7* 41.2* 39.9*  CREATININE 0.82 0.81 0.77 0.79  --  0.93  --     Estimated Creatinine Clearance: 100.3 mL/min (by C-G formula based on Cr of 0.93).    Allergies  Allergen Reactions  . Propoxyphene Other (See Comments)    Unspecified    Antimicrobials this admission: 3/6 imipenem>> 2/23 vanc>>2/26; 3/6>> 2/23 zosyn>>3/2 2/17 cefaz>>2/18  Dose adjustments this admission: n/a  Microbiology results: 2/23 BCx1>>Negative 2/23 sputum>>few yeast (candida) MRSA pcr: neg  Kristina BertinHaley Philip Estes, PharmD, [facility] Clinical Pharmacist Pager [phone] 04/13/2015 9:43 AM

## 2015-04-13 NOTE — Progress Notes (Signed)
RT note- wean fio2 post ABG

## 2015-04-13 NOTE — Op Note (Signed)
Patient Name:           Kristina Estes   Date of Surgery:        04/13/2015  Pre op Diagnosis:      Abdominal wall hematoma, rule out dehiscence  Post op Diagnosis:    Abdominal wall hematoma  Procedure:                 Exploration of abdominal wound, evacuation of hematoma, placement of negative pressure dressing.  Surgeon:                     Edsel Petrin. Dalbert Batman, M.D., FACS  Assistant:                      Gurney Maxin, M.D.  Operative Indications:   This is a 45 year old female who recently underwent elective lumbar spine surgery.  That was complicated by severe injury to the left iliac artery and vein necessitating exploration, ligation, and iliac to common femoral bypass.  She was treated with an open abdomen for a week or 2, requiring ventilator support.  About 5 days ago she was returned to the operating room to see if the wound to be closed.  The fascial defect would not close and so it was bridged with a large sheet of phasic mesh.  She has come off the ventilator.  Deep venous thrombosis was identified and she has been a heparin drip.  Her abdomen has become distended and she has developed an acute blood loss anemia.  The heparin drip was stopped early this morning.  CT scan this morning showed a large hematoma with lots of air bubbles in it.  Leukocytosis 40,000 was noted.  Elevated liver function tests were noted.  We could not rule out sepsis.  It was not clear whether there was dehiscence or not.  Following resuscitation she was returned to the operating room for wound exploration, possible bowel resection.  Operative Findings:       There was a very large hematoma of the abdominal wall between the fascia repair and the deep subcutaneous tissue.  This was mostly clotted blood and was at least 1500 mL or more.  Once this was evacuated there was no active bleeding and exploration revealed that the mesh that was bridging the fascia was intact and there were no defects or herniations.   There was no odor and no evidence of enteric drainage.  Palpation through the mesh revealed that the abdominal viscera were fairly soft.  Procedure in Detail:          Following the induction of general endotracheal anesthesia the patient's abdomen was prepped and draped in a sterile fashion.  Surgical timeout was performed.  Intravenous antibiotics were given.  The drains were removed.  The skin staples were removed.  Subcutaneous Vicryl sutures were removed and we entered the large hematoma cavity.  We slowly evacuated this examining the periphery of the wound circumferentially and found no herniations or dehiscence.  We irrigated this out.  We used a pulsatile irrigator.  There was no active bleeding.  I chose to place a negative pressure dressing on the wound.  We used a large sponges from the negative pressure dressing kit.  I had placed 3 dry sponges and the wound.  The wound was then cleaned and dried and covered with the clear plastic adhesive.  The suction plate was placed on the center of the wound after cutting out an open area  and connected to the suction device at 1 25 mmHg.  This provided good negative pressure and contraction of the wound without leakage.  The patient tolerated the procedure well was returned to the ICU in stable condition.  She was going to remain on the ventilator.  Blood evacuated 1500-2000 mL.  Active blood loss during the case was less than 50 mL.  counts were correct.  There were no intraoperative complications.     Edsel Petrin. Dalbert Batman, M.D., FACS General and Minimally Invasive Surgery Breast and Colorectal Surgery  04/13/2015 3:06 PM

## 2015-04-13 NOTE — Progress Notes (Signed)
Patient ID: Kristina Estes, female   DOB: [birth year], 45 y.o.   MRN: [id number]     [address] [place], [place] [address]    Phone: [phone] FAX: [phone]     Subjective: llq drain with bloody output, clots--no output recorded, about 25cc rlq drain with bloody drainage around the site Midline wound with serous drainage, large amounts.   Nausea and vomiting despite NGT functioning.  1422m out. H&h down to 5.9/17.7  Denies chest pains, sob or worsening abdominal pain.   Objective:  Vital signs:  Filed Vitals:   04/13/15 0600 04/13/15 0630 04/13/15 0643 04/13/15 0700  BP: 118/71 118/71  118/85  Pulse: 135 138 135   Temp:  98.8 F (37.1 C)  98.4 F (36.9 C)  TempSrc:  Oral  Oral  Resp: _0 Height:      Weight:      SpO2: 100% 100% 100% 100%    Last BM Date: 04/09/15  Intake/Output   Yesterday:  03/05 0701 - 03/06 0700 In: 4027.2 [I.V.:911.3; Blood:402.9; NG/GT:120; TMVV:6122]Out: 34497 [NPYYF:1102 Emesis/NG output:1495] This shift:     Physical Exam: General: Pt awake/alert/oriented x4 in no acute distress  Abdomen: Soft.  Swelling superior aspect of the wound with serous drainage, incision with staples in place, but I suspect she has dehisced her fascia below.  Bilateral drains with bloody output.    Problem List:   Active Problems:   Lumbar spondylolysis   Acute respiratory failure with hypoxia (HCC)   Hemorrhagic shock   Hypervolemia   Acute respiratory failure with hypercapnia (HCC)   Fever, unspecified   Pressure ulcer    Results:   Labs: Results for orders placed or performed during the hospital encounter of 03/27/15 (from the past 48 hour(s))  Glucose, capillary     Status: Abnormal   Collection Time: 04/11/15  8:08 AM  Result Value Ref Range   Glucose-Capillary 187 (H) 65 - 99 mg/dL   Comment 1 Capillary Specimen    Comment 2 Notify RN   Glucose, capillary      Status: Abnormal   Collection Time: 04/11/15 12:11 PM  Result Value Ref Range   Glucose-Capillary 202 (H) 65 - 99 mg/dL   Comment 1 Capillary Specimen    Comment 2 Notify RN   Glucose, capillary     Status: None   Collection Time: 04/11/15  4:17 PM  Result Value Ref Range   Glucose-Capillary 99 65 - 99 mg/dL   Comment 1 Capillary Specimen    Comment 2 Notify RN   Glucose, capillary     Status: Abnormal   Collection Time: 04/11/15  8:13 PM  Result Value Ref Range   Glucose-Capillary 133 (H) 65 - 99 mg/dL   Comment 1 Capillary Specimen    Comment 2 Notify RN   Glucose, capillary     Status: Abnormal   Collection Time: 04/12/15 12:15 AM  Result Value Ref Range   Glucose-Capillary 191 (H) 65 - 99 mg/dL   Comment 1 Capillary Specimen    Comment 2 Notify RN   Glucose, capillary     Status: Abnormal   Collection Time: 04/12/15  4:18 AM  Result Value Ref Range   Glucose-Capillary 203 (H) 65 - 99 mg/dL   Comment 1 Capillary Specimen    Comment 2 Notify RN   Heparin level (unfractionated)     Status: None   Collection  Time: 04/12/15  4:40 AM  Result Value Ref Range   Heparin Unfractionated 0.62 0.30 - 0.70 IU/mL    Comment:        IF HEPARIN RESULTS ARE BELOW EXPECTED VALUES, AND PATIENT DOSAGE HAS BEEN CONFIRMED, SUGGEST FOLLOW UP TESTING OF ANTITHROMBIN III LEVELS.   CBC     Status: Abnormal   Collection Time: 04/12/15  4:40 AM  Result Value Ref Range   WBC 32.7 (H) 4.0 - 10.5 K/uL   RBC 2.26 (L) 3.87 - 5.11 MIL/uL   Hemoglobin 6.5 (LL) 12.0 - 15.0 g/dL    Comment: REPEATED TO VERIFY CRITICAL RESULT CALLED TO, READ BACK BY AND VERIFIED WITH: R.SARINE,RN 2574 04/12/15 M.CAMPBELL    HCT 20.2 (L) 36.0 - 46.0 %   MCV 89.4 78.0 - 100.0 fL   MCH 28.8 26.0 - 34.0 pg   MCHC 32.2 30.0 - 36.0 g/dL   RDW 16.5 (H) 11.5 - 15.5 %   Platelets 658 (H) 150 - 400 K/uL  Prepare RBC     Status: None   Collection Time: 04/12/15  6:30 AM  Result Value Ref Range   Order Confirmation  ORDER PROCESSED BY BLOOD BANK   Type and screen     Status: None (Preliminary result)   Collection Time: 04/12/15  7:15 AM  Result Value Ref Range   ABO/RH(D) O POS    Antibody Screen NEG    Sample Expiration 04/15/2015    Unit Number [id number]    Blood Component Type RED CELLS,LR    Unit division 00    Status of Unit ISSUED    Transfusion Status OK TO TRANSFUSE    Crossmatch Result Compatible    Unit Number [id number]    Blood Component Type RBC LR PHER2    Unit division 00    Status of Unit ISSUED    Transfusion Status OK TO TRANSFUSE    Crossmatch Result Compatible    Unit Number [id number]    Blood Component Type RBC LR PHER1    Unit division 00    Status of Unit ALLOCATED    Transfusion Status OK TO TRANSFUSE    Crossmatch Result Compatible   Glucose, capillary     Status: Abnormal   Collection Time: 04/12/15  8:09 AM  Result Value Ref Range   Glucose-Capillary 188 (H) 65 - 99 mg/dL   Comment 1 Capillary Specimen    Comment 2 Notify RN   Glucose, capillary     Status: Abnormal   Collection Time: 04/12/15 11:04 AM  Result Value Ref Range   Glucose-Capillary 131 (H) 65 - 99 mg/dL   Comment 1 Capillary Specimen    Comment 2 Notify RN   Glucose, capillary     Status: Abnormal   Collection Time: 04/12/15  4:49 PM  Result Value Ref Range   Glucose-Capillary 127 (H) 65 - 99 mg/dL   Comment 1 Capillary Specimen    Comment 2 Notify RN   Glucose, capillary     Status: Abnormal   Collection Time: 04/12/15  8:06 PM  Result Value Ref Range   Glucose-Capillary 199 (H) 65 - 99 mg/dL   Comment 1 Notify RN    Comment 2 Document in Chart   Glucose, capillary     Status: Abnormal   Collection Time: 04/13/15 12:14 AM  Result Value Ref Range   Glucose-Capillary 209 (H) 65 - 99 mg/dL   Comment 1 Notify RN    Comment 2 Document in Chart  Glucose, capillary     Status: Abnormal   Collection Time: 04/13/15  4:04 AM  Result Value Ref Range   Glucose-Capillary 273 (H)  65 - 99 mg/dL   Comment 1 Notify RN    Comment 2 Document in Chart   Heparin level (unfractionated)     Status: Abnormal   Collection Time: 04/13/15  4:56 AM  Result Value Ref Range   Heparin Unfractionated 0.75 (H) 0.30 - 0.70 IU/mL    Comment:        IF HEPARIN RESULTS ARE BELOW EXPECTED VALUES, AND PATIENT DOSAGE HAS BEEN CONFIRMED, SUGGEST FOLLOW UP TESTING OF ANTITHROMBIN III LEVELS.   Comprehensive metabolic panel     Status: Abnormal   Collection Time: 04/13/15  4:56 AM  Result Value Ref Range   Sodium 135 135 - 145 mmol/L   Potassium 5.0 3.5 - 5.1 mmol/L    Comment: DELTA CHECK NOTED   Chloride 105 101 - 111 mmol/L   CO2 23 22 - 32 mmol/L   Glucose, Bld 296 (H) 65 - 99 mg/dL   BUN 32 (H) 6 - 20 mg/dL   Creatinine, Ser 0.93 0.44 - 1.00 mg/dL   Calcium 7.9 (L) 8.9 - 10.3 mg/dL   Total Protein 5.4 (L) 6.5 - 8.1 g/dL   Albumin 1.3 (L) 3.5 - 5.0 g/dL   AST 53 (H) 15 - 41 U/L   ALT 94 (H) 14 - 54 U/L   Alkaline Phosphatase 106 38 - 126 U/L   Total Bilirubin 3.7 (H) 0.3 - 1.2 mg/dL   GFR calc non Af Amer >60 >60 mL/min   GFR calc Af Amer >60 >60 mL/min    Comment: (NOTE) The eGFR has been calculated using the CKD EPI equation. This calculation has not been validated in all clinical situations. eGFR's persistently <60 mL/min signify possible Chronic Kidney Disease.    Anion gap 7 5 - 15  Magnesium     Status: None   Collection Time: 04/13/15  4:56 AM  Result Value Ref Range   Magnesium 2.0 1.7 - 2.4 mg/dL  Phosphorus     Status: None   Collection Time: 04/13/15  4:56 AM  Result Value Ref Range   Phosphorus 3.9 2.5 - 4.6 mg/dL  Differential     Status: Abnormal   Collection Time: 04/13/15  4:56 AM  Result Value Ref Range   Neutrophils Relative % 78 %   Lymphocytes Relative 13 %   Monocytes Relative 7 %   Eosinophils Relative 1 %   Basophils Relative 1 %   Neutro Abs 32.1 (H) 1.7 - 7.7 K/uL   Lymphs Abs 5.4 (H) 0.7 - 4.0 K/uL   Monocytes Absolute 2.9 (H) 0.1 -  1.0 K/uL   Eosinophils Absolute 0.4 0.0 - 0.7 K/uL   Basophils Absolute 0.4 (H) 0.0 - 0.1 K/uL   RBC Morphology POLYCHROMASIA PRESENT    WBC Morphology MILD LEFT SHIFT (1-5% METAS, OCC MYELO, OCC BANDS)    Smear Review LARGE PLATELETS PRESENT   Prealbumin     Status: Abnormal   Collection Time: 04/13/15  4:56 AM  Result Value Ref Range   Prealbumin 14.1 (L) 18 - 38 mg/dL  CBC     Status: Abnormal   Collection Time: 04/13/15  4:56 AM  Result Value Ref Range   WBC 41.2 (H) 4.0 - 10.5 K/uL   RBC 1.96 (L) 3.87 - 5.11 MIL/uL   Hemoglobin 5.9 (LL) 12.0 - 15.0 g/dL    Comment:  REPEATED TO VERIFY CRITICAL VALUE NOTED.  VALUE IS CONSISTENT WITH PREVIOUSLY REPORTED AND CALLED VALUE.    HCT 17.7 (L) 36.0 - 46.0 %   MCV 90.3 78.0 - 100.0 fL   MCH 30.1 26.0 - 34.0 pg   MCHC 33.3 30.0 - 36.0 g/dL   RDW 16.2 (H) 11.5 - 15.5 %   Platelets 553 (H) 150 - 400 K/uL  Triglycerides     Status: Abnormal   Collection Time: 04/13/15  4:56 AM  Result Value Ref Range   Triglycerides 194 (H) <150 mg/dL  Prepare RBC     Status: None   Collection Time: 04/13/15  6:27 AM  Result Value Ref Range   Order Confirmation ORDER PROCESSED BY BLOOD BANK     Imaging / Studies: Dg Abd Portable 1v  04/13/2015  CLINICAL DATA:  NG tube placement. EXAM: PORTABLE ABDOMEN - 1 VIEW COMPARISON:  04/10/2015 FINDINGS: Enteric tube tip is present in the left upper quadrant consistent with location in the body of the stomach. Skin clips along the midline. Surgical clips in the right upper quadrant. IMPRESSION: Enteric tube tip localizes to the left upper quadrant consistent with location in the body of the stomach. Electronically Signed   By: Lucienne Capers M.D.   On: 04/13/2015 01:24    Medications / Allergies:  Scheduled Meds: . sodium chloride   Intravenous Once  . antiseptic oral rinse  7 mL Mouth Rinse QID  . chlorhexidine gluconate  15 mL Mouth Rinse BID  . docusate  100 mg Oral Daily  . insulin aspart  0-15 Units  Subcutaneous 4 times per day  . metoCLOPramide  10 mg Oral 4 times per day  . morphine   Intravenous 6 times per day  . pantoprazole (PROTONIX) IV  40 mg Intravenous QHS  . sodium chloride flush  10-40 mL Intracatheter Q12H   Continuous Infusions: . Marland KitchenTPN (CLINIMIX-E) Adult 203 mL/hr at 04/13/15 0600  . sodium chloride 10 mL/hr at 04/13/15 0600   PRN Meds:.[DISCONTINUED] acetaminophen **OR** acetaminophen, diphenhydrAMINE **OR** diphenhydrAMINE, morphine injection, naloxone **AND** sodium chloride flush, ondansetron (ZOFRAN) IV, sodium chloride flush  Antibiotics: Anti-infectives    Start     Dose/Rate Route Frequency Ordered Stop   04/02/15 2330  vancomycin (VANCOCIN) IVPB 1000 mg/200 mL premix  Status:  Discontinued     1,000 mg 200 mL/hr over 60 Minutes Intravenous Every 12 hours 04/02/15 1015 04/05/15 0849   04/02/15 1830  piperacillin-tazobactam (ZOSYN) IVPB 3.375 g  Status:  Discontinued     3.375 g 12.5 mL/hr over 240 Minutes Intravenous 3 times per day 04/02/15 1015 04/09/15 0804   04/02/15 1100  cefUROXime (ZINACEF) 1.5 g in dextrose 5 % 50 mL IVPB  Status:  Discontinued     1.5 g 100 mL/hr over 30 Minutes Intravenous To ShortStay Surgical 04/01/15 1106 04/02/15 1448   04/02/15 1100  vancomycin (VANCOCIN) 2,500 mg in sodium chloride 0.9 % 500 mL IVPB     2,500 mg 250 mL/hr over 120 Minutes Intravenous  Once 04/02/15 1029 04/02/15 1342   04/02/15 1030  vancomycin (VANCOCIN) 2,250 mg in sodium chloride 0.9 % 500 mL IVPB  Status:  Discontinued     2,250 mg 250 mL/hr over 120 Minutes Intravenous  Once 04/02/15 1015 04/02/15 1029   04/02/15 1030  piperacillin-tazobactam (ZOSYN) IVPB 3.375 g     3.375 g 100 mL/hr over 30 Minutes Intravenous  Once 04/02/15 1015 04/02/15 1212   03/27/15 2130  cefUROXime (ZINACEF) 1.5 g  in dextrose 5 % 50 mL IVPB  Status:  Discontinued     1.5 g 100 mL/hr over 30 Minutes Intravenous Every 12 hours 03/27/15 2124 03/27/15 2132   03/27/15 1600   ceFAZolin (ANCEF) IVPB 2 g/50 mL premix     2 g 100 mL/hr over 30 Minutes Intravenous Every 8 hours 03/27/15 1322 03/28/15 0759   03/27/15 1447  ceFAZolin (ANCEF) 1-5 GM-% IVPB    Comments:  Claybon Jabs   : cabinet override      03/27/15 1447 03/28/15 0259   03/27/15 0800  bacitracin 50,000 Units in sodium chloride irrigation 0.9 % 500 mL irrigation  Status:  Discontinued       As needed 03/27/15 0943 03/27/15 1223   03/27/15 0700  ceFAZolin (ANCEF) 3 g in dextrose 5 % 50 mL IVPB     3 g 130 mL/hr over 30 Minutes Intravenous To ShortStay Surgical 03/26/15 1409 03/27/15 0750        Assessment/Plan S/p L5 laminectomy Left iliac vein and left common iliac artery bleeding  POD#5 exploratory laparotomy, abdominal wound closure I suspect her fascia has dehisced given large volume of peritoneal fluid drainage.  Will keep staples in place for now, but may need to open with unable to evacuate the seroma.  JP drains with bloody output, question continued bleeding.  Will proceed with a stat CT scan of abdomen and pelvis. Continue NGT decompression, anti-emetics ABLA-transfuse 2 units of pRBCs.  Leukocytosis-continues to increase, unclear cause, will evaluate for abdominal source.  PCM-TPN Left common iliac DVT-continue to hold heparin gtt  Recommend CCM involvement for medical management    Erby Pian, [facility] Pager [phone](7A-4:30P) For consults and floor pages call [phone](7A-4:30P)  04/13/2015 7:33 AM

## 2015-04-13 NOTE — Progress Notes (Signed)
Placed on tele to monitor  

## 2015-04-13 NOTE — Progress Notes (Signed)
eLink Physician-Brief Progress Note Patient Name: Kristina Estes DOB: 10/20/1970 MRN: [id number]   Date of Service  04/13/2015  HPI/Events of Note  RT notified that ETT at 21cm. Reviewed CXR which shows ETT approximately 1.4cm above carina.   eICU Interventions  RT to withdraw ETT 1cm.     Intervention Category Intermediate Interventions: Diagnostic test evaluation  Lawanda CousinsJennings Karel Turpen 04/13/2015, 5:22 PM

## 2015-04-14 ENCOUNTER — Encounter (HOSPITAL_COMMUNITY)
Admission: RE | Disposition: A | Discharge status: Skilled Nursing Facility | Payer: Self-pay | Source: Ambulatory Visit | Attending: Neurosurgery

## 2015-04-14 ENCOUNTER — Ambulatory Visit (HOSPITAL_COMMUNITY): Admission: RE | Admit: 2015-04-14 | Payer: PRIVATE HEALTH INSURANCE | Source: Ambulatory Visit | Admitting: Surgery

## 2015-04-14 ENCOUNTER — Inpatient Hospital Stay (HOSPITAL_COMMUNITY): Payer: PRIVATE HEALTH INSURANCE

## 2015-04-14 ENCOUNTER — Encounter (HOSPITAL_COMMUNITY): Payer: Self-pay | Admitting: General Surgery

## 2015-04-14 ENCOUNTER — Inpatient Hospital Stay (HOSPITAL_COMMUNITY): Payer: PRIVATE HEALTH INSURANCE | Admitting: Certified Registered Nurse Anesthetist

## 2015-04-14 DIAGNOSIS — J9600 Acute respiratory failure, unspecified whether with hypoxia or hypercapnia: Secondary | ICD-10-CM

## 2015-04-14 DIAGNOSIS — I82402 Acute embolism and thrombosis of unspecified deep veins of left lower extremity: Secondary | ICD-10-CM

## 2015-04-14 DIAGNOSIS — R1900 Intra-abdominal and pelvic swelling, mass and lump, unspecified site: Secondary | ICD-10-CM | POA: Insufficient documentation

## 2015-04-14 DIAGNOSIS — I82499 Acute embolism and thrombosis of other specified deep vein of unspecified lower extremity: Secondary | ICD-10-CM

## 2015-04-14 HISTORY — PX: VENA CAVA FILTER PLACEMENT: SHX1085

## 2015-04-14 LAB — CBC
HCT: 18.8 % — ABNORMAL LOW (ref 36.0–46.0)
Hemoglobin: 6.2 g/dL — CL (ref 12.0–15.0)
MCH: 29 pg (ref 26.0–34.0)
MCHC: 33 g/dL (ref 30.0–36.0)
MCV: 87.9 fL (ref 78.0–100.0)
PLATELETS: 331 10*3/uL (ref 150–400)
RBC: 2.14 MIL/uL — ABNORMAL LOW (ref 3.87–5.11)
RDW: 16.8 % — AB (ref 11.5–15.5)
WBC: 26 10*3/uL — ABNORMAL HIGH (ref 4.0–10.5)

## 2015-04-14 LAB — GLUCOSE, CAPILLARY
GLUCOSE-CAPILLARY: 181 mg/dL — AB (ref 65–99)
GLUCOSE-CAPILLARY: 184 mg/dL — AB (ref 65–99)
GLUCOSE-CAPILLARY: 82 mg/dL (ref 65–99)
GLUCOSE-CAPILLARY: 84 mg/dL (ref 65–99)
Glucose-Capillary: 166 mg/dL — ABNORMAL HIGH (ref 65–99)
Glucose-Capillary: 68 mg/dL (ref 65–99)
Glucose-Capillary: 93 mg/dL (ref 65–99)

## 2015-04-14 LAB — COMPREHENSIVE METABOLIC PANEL
ALT: 63 U/L — ABNORMAL HIGH (ref 14–54)
ANION GAP: 10 (ref 5–15)
AST: 37 U/L (ref 15–41)
Albumin: 1.4 g/dL — ABNORMAL LOW (ref 3.5–5.0)
Alkaline Phosphatase: 85 U/L (ref 38–126)
BUN: 33 mg/dL — ABNORMAL HIGH (ref 6–20)
CHLORIDE: 105 mmol/L (ref 101–111)
CO2: 21 mmol/L — AB (ref 22–32)
Calcium: 7.8 mg/dL — ABNORMAL LOW (ref 8.9–10.3)
Creatinine, Ser: 0.85 mg/dL (ref 0.44–1.00)
Glucose, Bld: 231 mg/dL — ABNORMAL HIGH (ref 65–99)
Potassium: 4.5 mmol/L (ref 3.5–5.1)
SODIUM: 136 mmol/L (ref 135–145)
Total Bilirubin: 3.7 mg/dL — ABNORMAL HIGH (ref 0.3–1.2)
Total Protein: 4.6 g/dL — ABNORMAL LOW (ref 6.5–8.1)

## 2015-04-14 LAB — PREPARE RBC (CROSSMATCH)

## 2015-04-14 SURGERY — IVC FILTER INSERTION

## 2015-04-14 SURGERY — INSERTION, FILTER, VENA CAVA
Anesthesia: General

## 2015-04-14 MED ORDER — LIDOCAINE HCL (PF) 1 % IJ SOLN
INTRAMUSCULAR | Status: AC
  Filled 2015-04-14: qty 30

## 2015-04-14 MED ORDER — ROCURONIUM BROMIDE 50 MG/5ML IV SOLN
INTRAVENOUS | Status: AC
  Filled 2015-04-14: qty 1

## 2015-04-14 MED ORDER — ONDANSETRON HCL 4 MG/2ML IJ SOLN
INTRAMUSCULAR | Status: AC
  Filled 2015-04-14: qty 2

## 2015-04-14 MED ORDER — FUROSEMIDE 10 MG/ML IJ SOLN
40.0000 mg | Freq: Two times a day (BID) | INTRAMUSCULAR | Status: AC
  Administered 2015-04-14 – 2015-04-15 (×4): 40 mg via INTRAVENOUS
  Filled 2015-04-14 (×4): qty 4

## 2015-04-14 MED ORDER — MIDAZOLAM HCL 5 MG/5ML IJ SOLN
INTRAMUSCULAR | Status: DC | PRN
  Administered 2015-04-14 (×2): 2 mg via INTRAVENOUS

## 2015-04-14 MED ORDER — MIDAZOLAM HCL 2 MG/2ML IJ SOLN
INTRAMUSCULAR | Status: AC
  Filled 2015-04-14: qty 2

## 2015-04-14 MED ORDER — FENTANYL CITRATE (PF) 250 MCG/5ML IJ SOLN
INTRAMUSCULAR | Status: AC
  Filled 2015-04-14: qty 5

## 2015-04-14 MED ORDER — TRACE MINERALS CR-CU-MN-SE-ZN 10-1000-500-60 MCG/ML IV SOLN
INTRAVENOUS | Status: DC
  Administered 2015-04-14 (×2): via INTRAVENOUS
  Filled 2015-04-14: qty 2640

## 2015-04-14 MED ORDER — IOHEXOL 300 MG/ML  SOLN
100.0000 mL | Freq: Once | INTRAMUSCULAR | Status: AC | PRN
  Administered 2015-04-13: 100 mL via INTRAVENOUS

## 2015-04-14 MED ORDER — LIDOCAINE-EPINEPHRINE (PF) 1 %-1:200000 IJ SOLN
INTRAMUSCULAR | Status: DC | PRN
  Administered 2015-04-14: 4 mL

## 2015-04-14 MED ORDER — IOHEXOL 300 MG/ML  SOLN
INTRAMUSCULAR | Status: DC | PRN
  Administered 2015-04-14: 21 mL via INTRAVENOUS

## 2015-04-14 MED ORDER — SODIUM CHLORIDE 0.9 % IV SOLN: Freq: Once | INTRAVENOUS | Status: DC

## 2015-04-14 MED ORDER — ROCURONIUM BROMIDE 100 MG/10ML IV SOLN
INTRAVENOUS | Status: DC | PRN
  Administered 2015-04-14: 50 mg via INTRAVENOUS

## 2015-04-14 MED ORDER — METOCLOPRAMIDE HCL 5 MG/ML IJ SOLN
10.0000 mg | Freq: Four times a day (QID) | INTRAMUSCULAR | Status: DC
  Administered 2015-04-14 – 2015-04-25 (×37): 10 mg via INTRAVENOUS
  Filled 2015-04-14 (×37): qty 2

## 2015-04-14 MED ORDER — ONDANSETRON HCL 4 MG/2ML IJ SOLN
INTRAMUSCULAR | Status: DC | PRN
  Administered 2015-04-14: 4 mg via INTRAVENOUS

## 2015-04-14 MED ORDER — PHENYLEPHRINE HCL 10 MG/ML IJ SOLN
INTRAMUSCULAR | Status: DC | PRN
  Administered 2015-04-14 (×5): 80 ug via INTRAVENOUS

## 2015-04-14 MED ORDER — FENTANYL CITRATE (PF) 250 MCG/5ML IJ SOLN
INTRAMUSCULAR | Status: DC | PRN
  Administered 2015-04-14 (×2): 100 ug via INTRAVENOUS
  Administered 2015-04-14: 150 ug via INTRAVENOUS

## 2015-04-14 MED ORDER — SODIUM CHLORIDE 0.9 % IV SOLN
Freq: Once | INTRAVENOUS | Status: AC
  Administered 2015-04-14: 06:00:00 via INTRAVENOUS

## 2015-04-14 MED ORDER — SODIUM CHLORIDE 0.9 % IV SOLN
INTRAVENOUS | Status: DC | PRN
  Administered 2015-04-14: 16:00:00

## 2015-04-14 SURGICAL SUPPLY — 44 items
BAG BANDED W/RUBBER/TAPE 36X54 (MISCELLANEOUS) ×2 IMPLANT
BAG DECANTER FOR FLEXI CONT (MISCELLANEOUS) ×2 IMPLANT
BAG SNAP BAND KOVER 36X36 (MISCELLANEOUS) ×2 IMPLANT
BENZOIN TINCTURE PRP APPL 2/3 (GAUZE/BANDAGES/DRESSINGS) ×2 IMPLANT
BLADE SURG 11 STRL SS (BLADE) ×2 IMPLANT
CATH OMNI FLUSH .035X70CM (CATHETERS) ×2 IMPLANT
COVER DOME SNAP 22 D (MISCELLANEOUS) ×2 IMPLANT
COVER PROBE W GEL 5X96 (DRAPES) ×2 IMPLANT
COVER TABLE BACK 60X90 (DRAPES) ×2 IMPLANT
DECANTER SPIKE VIAL GLASS SM (MISCELLANEOUS) IMPLANT
DRAPE C-ARM 42X72 X-RAY (DRAPES) IMPLANT
DRAPE LAPAROTOMY T 102X78X121 (DRAPES) IMPLANT
FILTER VC CELECT-JUGULAR (Miscellaneous) ×2 IMPLANT
GAUZE SPONGE 4X4 12PLY STRL (GAUZE/BANDAGES/DRESSINGS) IMPLANT
GAUZE SPONGE 4X4 16PLY XRAY LF (GAUZE/BANDAGES/DRESSINGS) ×2 IMPLANT
GLOVE BIOGEL PI IND STRL 7.5 (GLOVE) ×2 IMPLANT
GLOVE BIOGEL PI INDICATOR 7.5 (GLOVE) ×2
GLOVE SURG SS PI 7.5 STRL IVOR (GLOVE) ×6 IMPLANT
GOWN STRL REUS W/ TWL LRG LVL3 (GOWN DISPOSABLE) IMPLANT
GOWN STRL REUS W/ TWL XL LVL3 (GOWN DISPOSABLE) ×2 IMPLANT
GOWN STRL REUS W/TWL LRG LVL3 (GOWN DISPOSABLE)
GOWN STRL REUS W/TWL XL LVL3 (GOWN DISPOSABLE) ×2
KIT BASIN OR (CUSTOM PROCEDURE TRAY) ×2 IMPLANT
KIT ROOM TURNOVER OR (KITS) ×2 IMPLANT
LIQUID BAND (GAUZE/BANDAGES/DRESSINGS) ×2 IMPLANT
NEEDLE HYPO 25GX1X1/2 BEV (NEEDLE) ×4 IMPLANT
NEEDLE PERC 18GX7CM (NEEDLE) ×2 IMPLANT
NS IRRIG 1000ML POUR BTL (IV SOLUTION) ×2 IMPLANT
PACK SURGICAL SETUP 50X90 (CUSTOM PROCEDURE TRAY) ×2 IMPLANT
PAD ARMBOARD 7.5X6 YLW CONV (MISCELLANEOUS) ×4 IMPLANT
PROTECTION STATION PRESSURIZED (MISCELLANEOUS) ×2
SET MICROPUNCTURE 5F STIFF (MISCELLANEOUS) ×2 IMPLANT
STATION PROTECTION PRESSURIZED (MISCELLANEOUS) ×1 IMPLANT
STOPCOCK MORSE 400PSI 3WAY (MISCELLANEOUS) ×2 IMPLANT
STRIP CLOSURE SKIN 1/2X4 (GAUZE/BANDAGES/DRESSINGS) ×2 IMPLANT
SUT VICRYL 4-0 PS2 18IN ABS (SUTURE) ×2 IMPLANT
SYR 30ML LL (SYRINGE) ×2 IMPLANT
SYR 5ML LL (SYRINGE) ×2 IMPLANT
SYR CONTROL 10ML LL (SYRINGE) ×4 IMPLANT
SYR MEDRAD MARK V 150ML (SYRINGE) ×2 IMPLANT
TUBING HIGH PRESSURE 120CM (CONNECTOR) ×4 IMPLANT
WATER STERILE IRR 1000ML POUR (IV SOLUTION) IMPLANT
WIRE BENTSON .035X145CM (WIRE) ×2 IMPLANT
WIRE J 3MM .035X145CM (WIRE) ×2 IMPLANT

## 2015-04-14 NOTE — Progress Notes (Signed)
[facility] Progress Note  1 Day Post-Op  Subjective: Pt intubated on vent, but arouses.  She has put out one whole vac canister of blood overnight.  Pain in abdomen.  No N/V per nursing staff.  Just got one unit of blood.    Objective: Vital signs in last 24 hours: Temp:  [97.7 F (36.5 C)-99.9 F (37.7 C)] 98.5 F (36.9 C) (03/07 0614) Pulse Rate:  [106-138] 111 (03/07 0700) Resp:  [6-28] 21 (03/07 0700) BP: (91-172)/(33-114) 134/63 mmHg (03/07 0321) SpO2:  [98 %-100 %] 100 % (03/07 0700) Arterial Line BP: (100-172)/(47-89) 115/58 mmHg (03/07 0700) FiO2 (%):  [40 %-50 %] 40 % (03/07 0321) Last BM Date: 04/09/15  Intake/Output from previous day: 03/06 0701 - 03/07 0700 In: 2489.2 [I.V.:899.2; Blood:890; IV Piggyback:700] Out: 2430 [Urine:1280; Emesis/NG output:600; Drains:250; Blood:300] Intake/Output this shift:    PE: General: Intubated/sedated, obese, WD/WN AA female who is laying in bed in NAD HEENT: head is normocephalic, atraumatic.  Sclera are noninjected.  Ears and nose without any masses or lesions.  Mouth is pink and moist Heart: regular, rate, and rhythm.  Normal s1,s2. No obvious murmurs, gallops, or rubs noted.  Palpable radial and pedal pulses bilaterally Lungs: course breath sounds, but hard to hear due to body habitus.  Respiratory effort nonlabored Abd: Obese, soft, quite tender, distended, diminished BS, no masses felt, blue foam in place for wound vac, canister has put out >249mL/24hr serosanguinous drainage (new canister in place) MS: all 4 extremities are symmetrical with no cyanosis, clubbing, or edema. Skin: warm and dry with no masses, lesions, or rashes Psych: A&Ox3 with an appropriate affect.   Lab Results:   Recent Labs  04/13/15 1600 04/14/15 0400  WBC 38.6* 26.0*  HGB 8.4* 6.2*  HCT 25.1* 18.8*  PLT 443* 331   BMET  Recent Labs  04/13/15 2009 04/14/15 0400  NA 138 136  K 5.1 4.5  CL 107 105  CO2 21* 21*  GLUCOSE  254* 231*  BUN 32* 33*  CREATININE 1.01* 0.85  CALCIUM 7.9* 7.8*   PT/INR  Recent Labs  04/13/15 0910  LABPROT 16.7*  INR 1.34   CMP     Component Value Date/Time   NA 136 04/14/2015 0400   K 4.5 04/14/2015 0400   CL 105 04/14/2015 0400   CO2 21* 04/14/2015 0400   GLUCOSE 231* 04/14/2015 0400   BUN 33* 04/14/2015 0400   CREATININE 0.85 04/14/2015 0400   CALCIUM 7.8* 04/14/2015 0400   PROT 4.6* 04/14/2015 0400   ALBUMIN 1.4* 04/14/2015 0400   AST 37 04/14/2015 0400   ALT 63* 04/14/2015 0400   ALKPHOS 85 04/14/2015 0400   BILITOT 3.7* 04/14/2015 0400   GFRNONAA >60 04/14/2015 0400   GFRAA >60 04/14/2015 0400   Lipase  No results found for: LIPASE     Studies/Results: Ct Abdomen Pelvis W Contrast  04/13/2015  CLINICAL DATA:  Abdominal swelling. Abdominal mesh placed following the anterior repair of the iliac artery injury. EXAM: CT ABDOMEN AND PELVIS WITH CONTRAST TECHNIQUE: Multidetector CT imaging of the abdomen and pelvis was performed using the standard protocol following bolus administration of intravenous contrast. CONTRAST:  100 cc Omnipaque COMPARISON:  CT 03/27/2015 FINDINGS: Lower chest: Lung bases are clear. Hepatobiliary: No focal hepatic lesion. Postcholecystectomy. No biliary dilatation. Pancreas: Pancreas is normal. No ductal dilatation. No pancreatic inflammation. Spleen: Normal spleen Adrenals/urinary tract: Adrenal glands and kidneys are normal. The ureters and bladder normal. Stomach/Bowel: NG tube in the  stomach. Loops of small bowel and colon are decompressed. The small bowel appears to remain within the peritoneal space without clear evidence of herniation into the ventral abdominal complex fluid collection. Along the most inferior margin of the fluid collection difficult to completely exclude bowel entering the fluid collection. Colon appears normal. No intraperitoneal free air. Vascular/Lymphatic: Abdominal aorta is normal caliber. The iliac arteries  perfuse normally. Interval reduction in volume of hematoma along the LEFT iliac vessels measuring 5.3 by 5.4 cm in axial dimension compared to 9.9 x 7.4 cm. There is poor opacification of the LEFT common femoral vein and LEFT external and common iliac vein (images 91 through 75 of series 201). Reproductive: Uterus and ovaries are normal. Other: Large ventral complex fluid collection is new from prior. Collection measures 17 cm x 11 cm by 32 cm for a calculated volume estimation of 3000 cubic cm. There surgical drain in the most ventral (superficial) aspect of the fluid collection. There are a complex loculations within the collection. There is a hematocrit effect superiorly within the collection with a fluid fluid level on image 27, series 201. Scattered gas within the collection is felt to relate to infection not bowel perforation. The surgical mesh is not identified. Musculoskeletal: No aggressive osseous lesion. IMPRESSION: 1. Large complex ventral fluid collection with interspersed gas is is favored to represent a large postsurgical abscess / hematoma with complex internal loculations. Volume estimation of 3000 cubic cm. 2. Small bowel bowel approximates the ventral peritoneal surface and may enter the most caudal aspect of the fluid collection but is not felt to occupy a significant portion of the large fluid collection. 3. Surgical drains within the ventral portion of the fluid collection. 4. Interval reduction in LEFT retroperitoneal pelvic hematoma. 5. No evidence of bowel obstruction. 6. Deep venous thrombosis within the LEFT common femoral vein. Findings conveyed Pura SpicetoHaywood Ingram, MDon 04/13/2015  [id number]. Electronically Signed   By: Genevive BiStewart  Edmunds M.D.   On: 04/13/2015 09:22   Dg Chest Port 1 View  04/13/2015  CLINICAL DATA:  Endotracheal tube repositioning. EXAM: PORTABLE CHEST 1 VIEW COMPARISON:  Earlier the same date and 04/10/2015. FINDINGS: 1654 hours. The endotracheal tube has been partially  withdrawn, although is still at the level of the carina, directed towards the right mainstem bronchus. Right arm PICC extends to the lower SVC level. The nasogastric tube appears unchanged. There is increased atelectasis at the left lung base. The heart size and mediastinal contours are stable. There is no pneumothorax or significant pleural effusion. IMPRESSION: Although improved, the endotracheal tube tip remains low, at the level of the carina. For more optimal positioning, withdrawal of the tube by approximately 4-5 cm recommended. Critical Value/emergent results were called by telephone at the time of interpretation on 04/13/2015 at 5:07 pm to the patient's nurse, Liborio NixonJanice, who verbally acknowledged these results. Electronically Signed   By: Carey BullocksWilliam  Veazey M.D.   On: 04/13/2015 17:08   Dg Chest Port 1 View  04/13/2015  CLINICAL DATA:  Intubation EXAM: PORTABLE CHEST 1 VIEW COMPARISON:  04/10/2015 FINDINGS: Stable right PICC line and nasogastric tube. Endotracheal tube has been placed and extends about 1.9 cm beyond the carina into the right main bronchus. Heart size upper normal and stable. Lungs clear. IMPRESSION: Suggests withdrawing the endotracheal tube by 3-4 cm and repeating the image. Critical Value/emergent results were called by telephone at the time of interpretation on 04/13/2015 at 4:20 pm to the patient's nurse, Liborio NixonJanice, who verbally acknowledged these results. Electronically Signed  By: Esperanza Heir M.D.   On: 04/13/2015 16:20   Dg Abd Portable 1v  04/13/2015  CLINICAL DATA:  NG tube placement. EXAM: PORTABLE ABDOMEN - 1 VIEW COMPARISON:  04/10/2015 FINDINGS: Enteric tube tip is present in the left upper quadrant consistent with location in the body of the stomach. Skin clips along the midline. Surgical clips in the right upper quadrant. IMPRESSION: Enteric tube tip localizes to the left upper quadrant consistent with location in the body of the stomach. Electronically Signed   By: Burman Nieves M.D.   On: 04/13/2015 01:24    Anti-infectives: Anti-infectives    Start     Dose/Rate Route Frequency Ordered Stop   04/13/15 2230  vancomycin (VANCOCIN) IVPB 1000 mg/200 mL premix     1,000 mg 200 mL/hr over 60 Minutes Intravenous Every 12 hours 04/13/15 0948     04/13/15 1200  imipenem-cilastatin (PRIMAXIN) 500 mg in sodium chloride 0.9 % 100 mL IVPB     500 mg 200 mL/hr over 30 Minutes Intravenous 4 times per day 04/13/15 0946     04/13/15 1000  ceFAZolin (ANCEF) IVPB 2 g/50 mL premix  Status:  Discontinued     2 g 100 mL/hr over 30 Minutes Intravenous 3 times per day 04/13/15 0901 04/13/15 0936   04/13/15 1000  vancomycin (VANCOCIN) 2,500 mg in sodium chloride 0.9 % 500 mL IVPB     2,500 mg 250 mL/hr over 120 Minutes Intravenous  Once 04/13/15 0948 04/13/15 1405   04/02/15 2330  vancomycin (VANCOCIN) IVPB 1000 mg/200 mL premix  Status:  Discontinued     1,000 mg 200 mL/hr over 60 Minutes Intravenous Every 12 hours 04/02/15 1015 04/05/15 0849   04/02/15 1830  piperacillin-tazobactam (ZOSYN) IVPB 3.375 g  Status:  Discontinued     3.375 g 12.5 mL/hr over 240 Minutes Intravenous 3 times per day 04/02/15 1015 04/09/15 0804   04/02/15 1100  cefUROXime (ZINACEF) 1.5 g in dextrose 5 % 50 mL IVPB  Status:  Discontinued     1.5 g 100 mL/hr over 30 Minutes Intravenous To [facility]Stay [facility] 04/01/15 1106 04/02/15 1448   04/02/15 1100  vancomycin (VANCOCIN) 2,500 mg in sodium chloride 0.9 % 500 mL IVPB     2,500 mg 250 mL/hr over 120 Minutes Intravenous  Once 04/02/15 1029 04/02/15 1342   04/02/15 1030  vancomycin (VANCOCIN) 2,250 mg in sodium chloride 0.9 % 500 mL IVPB  Status:  Discontinued     2,250 mg 250 mL/hr over 120 Minutes Intravenous  Once 04/02/15 1015 04/02/15 1029   04/02/15 1030  piperacillin-tazobactam (ZOSYN) IVPB 3.375 g     3.375 g 100 mL/hr over 30 Minutes Intravenous  Once 04/02/15 1015 04/02/15 1212   03/27/15 2130  cefUROXime (ZINACEF) 1.5 g in dextrose 5 %  50 mL IVPB  Status:  Discontinued     1.5 g 100 mL/hr over 30 Minutes Intravenous Every 12 hours 03/27/15 2124 03/27/15 2132   03/27/15 1600  ceFAZolin (ANCEF) IVPB 2 g/50 mL premix     2 g 100 mL/hr over 30 Minutes Intravenous Every 8 hours 03/27/15 1322 03/28/15 0759   03/27/15 1447  ceFAZolin (ANCEF) 1-5 GM-% IVPB    Comments:  Merril Abbe   : cabinet override      03/27/15 1447 03/28/15 0259   03/27/15 0800  bacitracin 50,000 Units in sodium chloride irrigation 0.9 % 500 mL irrigation  Status:  Discontinued       As needed 03/27/15 0943  03/27/15 1223   03/27/15 0700  ceFAZolin (ANCEF) 3 g in dextrose 5 % 50 mL IVPB     3 g 130 mL/hr over 30 Minutes Intravenous To [facility]Stay Surgical 03/26/15 1409 03/27/15 0750       Assessment/Plan S/p L5 laminectomy (2/17/7 - Dr. Conchita Paris) Left iliac vein and left common iliac artery bleeding  S/p Ex lap, repair of left iliac vein, ligation of distal left common iliac artery, left common iliac artery to left common femoral bypass with 8mm Dacron, placement of abdominal negative pressure dressing (03/27/15 - Early and Imogene Burn) Subsequently 4 abdominal washouts and wound vac changes 2/19, 2/21, 2/23, 2/27 Imogene Burn) POD#6 s/p exploratory laparotomy, abdominal wound closure with Phasix mesh, drain placement Post-operative abdominal wall hematoma Derrell Lolling) POD #1 s/p Exploration of abdominal wound, evacuation of hematoma, placement of negative pressure dressing Derrell Lolling) -CCM helping to manage -NPO, IVF, pain control, antiemetics -Continue NGT decompression -To OR tomorrow with Dr. Derrell Lolling for abdominal wound exploration, dressing change, and possible abdominal wound closure -Pre-op'ed patient for tomorrow -Dr. Imogene Burn planning IVC filter today  ABLA-2 units pRBC yesterday, 5 since admission, 1 unit given this am, will give one more, would like to make sure Hgb is >8 prior to OR Leukocytosis-Down to 26,000 after OR, on Primaxin and Vancomycin Day #2 PCM-TPN  for now Left common iliac DVT-continue to hold heparin gtt Disp - IVC filter today, OR tomorrow    LOS: 18 days    Nonie Hoyer 04/14/2015, 7:38 AM Pager: [phone]  (7am - 4:30pm M-F; 7am - 11:30am Sa/Su)

## 2015-04-14 NOTE — Progress Notes (Signed)
CRITICAL VALUE ALERT  Critical value received:  Hemoglobin 6.2   Date of notification:  04/14/15   Time of notification:  0425   Critical value read back:Yes.    Nurse who received alert:  Herbert DeanerPuja Ronna Herskowitz RN   MD notified (1st page):  425   Time of first page:  425  Responding MD: [facility]([place])   Time MD responded:  430  MD ordered PRBCs x 1

## 2015-04-14 NOTE — Interval H&P Note (Signed)
History and Physical Interval Note:  04/14/2015 3:09 PM  Kristina Estes  has presented today for surgery, with the diagnosis of DVT  The various methods of treatment have been discussed with the patient and family. After consideration of risks, benefits and other options for treatment, the patient has consented to  Procedure(s): INSERTION VENA-CAVA FILTER (N/A) as a surgical intervention .  The patient's history has been reviewed, patient examined, no change in status, stable for surgery.  I have reviewed the patient's chart and labs.  Questions were answered to the patient's satisfaction.     Durene CalBrabham, Wells  Asked to place IVC filter given inability to anticoagulate and h/o DVT  [name]

## 2015-04-14 NOTE — Anesthesia Postprocedure Evaluation (Signed)
Anesthesia Post Note  Patient: Kristina Estes  Procedure(s) Performed: Procedure(s) (LRB): INSERTION VENA-CAVA FILTER (N/A)  Patient location during evaluation: ICU Anesthesia Type: General Level of consciousness: sedated Pain management: pain level controlled Vital Signs Assessment: post-procedure vital signs reviewed and stable Respiratory status: patient remains intubated per anesthesia plan Cardiovascular status: stable Anesthetic complications: no    Last Vitals:  Filed Vitals:   04/14/15 1400 04/14/15 1500  BP:    Pulse: 108 110  Temp:    Resp: 20 20    Last Pain:  Filed Vitals:   04/14/15 1739  PainSc: 2                  Kennieth RadFitzgerald, Nancylee Gaines E

## 2015-04-14 NOTE — Anesthesia Preprocedure Evaluation (Signed)
Anesthesia Evaluation  Patient identified by MRN, date of birth, ID band Patient unresponsive    Reviewed: Allergy & Precautions, NPO status , Patient's Chart, lab work & pertinent test results, Unable to perform ROS - Chart review only  History of Anesthesia Complications Negative for: history of anesthetic complications  Airway Mallampati: Intubated  TM Distance: >3 FB Neck ROM: Full    Dental  (+) Teeth Intact   Pulmonary asthma ,  VDRF s/p vascular complication during back surgery   breath sounds clear to auscultation       Cardiovascular Exercise Tolerance: Good hypertension, Pt. on medications (-) angina+ dysrhythmias  Rhythm:Regular Rate:Tachycardia  Recent hypovolemic shock, now s/p resusc and transfusion, hemodynamically stable and off of pressors currently   Neuro/Psych Remains sedated and ventilated Chronic back pain negative neurological ROS  negative psych ROS   GI/Hepatic Neg liver ROS, GERD  Controlled,Large complex ventral fluid collection with interspersed gas is favored to represent a large postsurgical abscess / hematoma    Endo/Other  Morbid obesity  Renal/GU negative Renal ROS  negative genitourinary   Musculoskeletal  (+) Arthritis ,   Abdominal (+) + obese,   Peds  Hematology  (+) Blood dyscrasia (Hb 6.2. Transfused this AM. Will recheck), anemia ,   Anesthesia Other Findings   Reproductive/Obstetrics                             Anesthesia Physical  Anesthesia Plan  ASA: IV  Anesthesia Plan: General   Post-op Pain Management:    Induction: Intravenous  Airway Management Planned: Oral ETT  Additional Equipment: Arterial line and CVP  Intra-op Plan:   Post-operative Plan: Post-operative intubation/ventilation  Informed Consent: I have reviewed the patients History and Physical, chart, labs and discussed the procedure including the risks, benefits and  alternatives for the proposed anesthesia with the patient or authorized representative who has indicated his/her understanding and acceptance.   Dental advisory given  Plan Discussed with: CRNA, Surgeon and Anesthesiologist  Anesthesia Plan Comments: ( )        Anesthesia Quick Evaluation

## 2015-04-14 NOTE — Anesthesia Postprocedure Evaluation (Signed)
Anesthesia Post Note  Patient: Kristina Estes  Procedure(s) Performed: Procedure(s) (LRB): EXPLORATORY LAPAROTOMY (N/A) APPLICATION OF WOUND VAC (N/A)  Patient location during evaluation: ICU Anesthesia Type: General Level of consciousness: patient remains intubated per anesthesia plan Pain management: pain level controlled Vital Signs Assessment: post-procedure vital signs reviewed and stable Respiratory status: patient remains intubated per anesthesia plan Cardiovascular status: stable Postop Assessment: no signs of nausea or vomiting Anesthetic complications: no    Last Vitals:  Filed Vitals:   04/14/15 1143 04/14/15 1200  BP:    Pulse:  103  Temp: 36.4 C   Resp:  21    Last Pain:  Filed Vitals:   04/14/15 1228  PainSc: 2                  Kristina Estes S

## 2015-04-14 NOTE — Anesthesia Preprocedure Evaluation (Addendum)
Anesthesia Evaluation  Patient identified by MRN, date of birth, ID band Patient awake    Reviewed: Allergy & Precautions, H&P , NPO status , Patient's Chart, lab work & pertinent test results  Airway Mallampati: Intubated       Dental no notable dental hx.    Pulmonary  Intubated on vent   Pulmonary exam normal breath sounds clear to auscultation       Cardiovascular negative cardio ROS   Rhythm:Regular Rate:Normal     Neuro/Psych negative neurological ROS  negative psych ROS   GI/Hepatic Neg liver ROS, GERD  ,  Endo/Other  Morbid obesity  Renal/GU negative Renal ROS  negative genitourinary   Musculoskeletal  (+) Arthritis , Osteoarthritis,    Abdominal   Peds  Hematology negative hematology ROS (+) anemia ,   Anesthesia Other Findings   Reproductive/Obstetrics negative OB ROS                            Anesthesia Physical Anesthesia Plan  ASA: IV  Anesthesia Plan: General   Post-op Pain Management:    Induction: Intravenous  Airway Management Planned: Oral ETT  Additional Equipment:   Intra-op Plan:   Post-operative Plan: Post-operative intubation/ventilation  Informed Consent: I have reviewed the patients History and Physical, chart, labs and discussed the procedure including the risks, benefits and alternatives for the proposed anesthesia with the patient or authorized representative who has indicated his/her understanding and acceptance.   Dental advisory given  Plan Discussed with: CRNA  Anesthesia Plan Comments:        Anesthesia Quick Evaluation

## 2015-04-14 NOTE — H&P (View-Only) (Signed)
   Daily Progress Note  Assessment/Planning: S/p x-lap, Repair L CIV, Ligation distal L CIA, L CIA to CFA bypass, s/p multiple abd vac changes x 4 (03/27/15) S/p Abd closure with mesh (04/08/15), S/p Evac of abd hematoma (04/13/15)   Scheduled for IVC filter but abd left open so pt on vent. so will need to be done in OR.  Some scheduling issues with anesthesia, so might have to roll IVC filter placement onto tomorrow's schedule.  Subjective  - 1 Day Post-Op  No events overnight, continued bleeding   Objective Filed Vitals:   04/14/15 0830 04/14/15 0845 04/14/15 0900 04/14/15 0915  BP:      Pulse:  104 106 105  Temp:  98.8 F (37.1 C)    TempSrc:      Resp:  23 20 21   Height:      Weight:      SpO2: 98% 100% 100% 100%    Intake/Output Summary (Last 24 hours) at 04/14/15 0928 Last data filed at 04/14/15 0800  Gross per 24 hour  Intake 4107.92 ml  Output   3115 ml  Net 992.92 ml   VASC  Warm left foot, edema 1+, faintly palpable DP, L groin bandages  Laboratory CBC    Component Value Date/Time   WBC 26.0* 04/14/2015 0400   HGB 6.2* 04/14/2015 0400   HCT 18.8* 04/14/2015 0400   PLT 331 04/14/2015 0400    BMET    Component Value Date/Time   NA 136 04/14/2015 0400   K 4.5 04/14/2015 0400   CL 105 04/14/2015 0400   CO2 21* 04/14/2015 0400   GLUCOSE 231* 04/14/2015 0400   BUN 33* 04/14/2015 0400   CREATININE 0.85 04/14/2015 0400   CALCIUM 7.8* 04/14/2015 0400   GFRNONAA >60 04/14/2015 0400   GFRAA >60 04/14/2015 0400    Leonides SakeBrian Emerita Berkemeier, MD [facility] Office: [phone] Pager: [phone]  04/14/2015, 9:28 AM

## 2015-04-14 NOTE — Progress Notes (Signed)
PARENTERAL NUTRITION CONSULT NOTE - FOLLOW UP  Pharmacy Consult:  TPN Indication:  Open abdomen s/p closure  Allergies  Allergen Reactions  . Propoxyphene Other (See Comments)    Unspecified    Patient Measurements: Height: 5\' 3"  (160 cm) Weight: 285 lb (129.275 kg) IBW/kg (Calculated) : 52.4  BMI = 59.7  Vital Signs: Temp: 98.5 F (36.9 C) (03/07 0614) Temp Source: Axillary (03/07 0614) BP: 134/63 mmHg (03/07 0321) Pulse Rate: 111 (03/07 0700) Intake/Output from previous day: 03/06 0701 - 03/07 0700 In: 2489.2 [I.V.:899.2; Blood:890; IV Piggyback:700] Out: 2430 [Urine:1280; Emesis/NG output:600; Drains:250; Blood:300]  Labs:  Recent Labs  04/13/15 0910 04/13/15 1414 04/13/15 1600 04/14/15 0400  WBC 39.9*  --  38.6* 26.0*  HGB 6.7* 8.8* 8.4* 6.2*  HCT 20.8* 26.0* 25.1* 18.8*  PLT 495*  --  443* 331  APTT 43*  --   --   --   INR 1.34  --   --   --      Recent Labs  04/13/15 0456  04/13/15 1600 04/13/15 2009 04/14/15 0400  NA 135  < > 138 138 136  K 5.0  < > 5.6* 5.1 4.5  CL 105  < > 106 107 105  CO2 23  < > 22 21* 21*  GLUCOSE 296*  < > 168* 254* 231*  BUN 32*  < > 34* 32* 33*  CREATININE 0.93  < > 1.07* 1.01* 0.85  CALCIUM 7.9*  < > 8.2* 7.9* 7.8*  MG 2.0  --   --   --   --   PHOS 3.9  --   --   --   --   PROT 5.4*  --   --   --  4.6*  ALBUMIN 1.3*  --   --   --  1.4*  AST 53*  --   --   --  37  ALT 94*  --   --   --  63*  ALKPHOS 106  --   --   --  85  BILITOT 3.7*  --   --   --  3.7*  PREALBUMIN 14.1*  --   --   --   --   TRIG 194*  --  318*  --   --   < > = values in this interval not displayed. Estimated Creatinine Clearance: 109.8 mL/min (by C-G formula based on Cr of 0.85).    Recent Labs  04/13/15 1945 04/14/15 0031 04/14/15 0423  GLUCAP 168* 181* 184*     Insulin Requirements in the past 24 hours:  13 units of SSI 25 units of regular insulin in TPN  Assessment: 44 YOF admitted on 03/27/15 with back and leg pain and  underwent L5 laminectomy with facetectomy later that day.  Intraoperatively patient went into shock and CT revealed large left RP hematoma.  Patient returned to the OR for ex-lap with repair of left iliac vein, ligation of distal left CIA, left CIA-CFA bypass, and placement of abdominal VAC.  Pharmacy consulted to manage TPN for nutritional support.  Surgeries and Procedures: 3/6 Exploration of abdominal wound, placement of negative pressure dressing  GI: hx GERD / obesity. Albumin low at 1.4. Baseline prealbumin low at 8.4, now up to 14.1. Frequent OR visits for abd washout (2/19, 2/21, 2/27) - too edematous for abd closure on 2/23, unable to close fascia over intestines on 2/27, s/p abdominal wall closure with mesh on 3/1. Tolerating cyclic TPN. Found to have a  large hematoma of the abdominal wall yesterday. No active bleeding found. Still having N/V despite functioning NGT. Output down to yesterday. PPI IV, PO docusate, Reglan    Endo: no hx DM - hypoglycemic prior to TPN initiation, SSI d/c'd 3/4 by RN and then CBGs became uncontrolled. SSI added back on. CBGs are better controlled today (160-180s on TPN and 100-130s off TPN) Will try to control CBGs before advancing cycle to 12 hrs.  Lytes: wnl (K good at 4.5) Phos and Mg ok yesterday. CoCa 10.1. CaxPhos < 55  Renal: SCr stable, CrCL > 100 ml/min - UOP down to 0.4 ml/kg/hr, NS at 88ml/hr and 10 ml/hr  Pulm: extubated 3/2 on RA  Cards: angina - BP ok, tachy (in ST)  AC: Holding heparin for new left femoral DVT (dx 03/29/15) due to bleeding - Hb at 6.2 (trasnfusion ordered), plts wnl  Hepatobil: LFTs trending down, Tbili down to 3.7. (RN noted a little jaundice in sclera on 3/3, seems to have no jaundice now). TG checked same day and got 194 then 318 yesterday (monitor closely)  Neuro: morphine PCA, pain score 5-6, visual hallucinations this am. Propofol started on 3/6, currently at 23.53ml/hr. Fentanyl 256mcg/hr.  ID: Afebrile, WBC  down to 26, off antibiotics   Best Practices: Holding heparin gtt, MC  TPN Access: right IJ placed 2/17>>2/25, triple lumen PICC 2/25>>  TPN start date: 2/21 >>  Current Nutrition:  Clinimix E 5/15 cycle over 14 hrs: 142ml/hr x 1 hr, then 203 ml/hr x 12 hrs, and 100 ml/hr x 1 hr (Total volume of 2640 ml) Hold 20% IVFE on Tues/Thurs due to propofol infusion Propofol @ 38mcg/kg/min (23.62ml/hr)  TPN + Propofol will provide a daily average of 132 g of protein and 2489 kCal, meeting 100% of protein and > 100% of kcal needs  Nutritional Goals: (per RD assessment on 3/2) Kcal: 2000-2200 Protein: 130-140 g  Plan:  Clinimix E 5/15 cycle over 14 hrs: 153ml/hr x 1 hr, then 203 ml/hr x 12 hrs, and 100 ml/hr x 1 hr (Total volume of 2640 ml) Hold 20% IVFE on Tues/Thurs due to propofol infusion Propofol @ 49mcg/kg/min (23.15ml/hr)  TPN + Propofol will provide a daily average of 132 g of protein and 2489 kCal, meeting 100% of protein and > 100% of kcal needs Continue NS at 75 ml/hr and KVO per MD Continue MVI and trace elements in TPN Increase to 35 units of regular insulin in TPN Continue moderate SSI and adjust as needed Monitor TPN labs, BMet tomorrow  Enzo Bi, PharmD, BCPS Clinical Pharmacist Pager [phone] 04/14/2015 7:44 AM

## 2015-04-14 NOTE — Op Note (Signed)
    Patient name: Kristina Estes MRN: [id number] DOB: 07/13/1970 Sex: female  03/27/2015 - 04/14/2015 Pre-operative Diagnosis: DVT Post-operative diagnosis:  Same Surgeon:  Durene CalBrabham, Wells Assistants:  NONE Procedure:   #1: Ultrasound guided access, right internal jugular vein   #2: Catheter and IVC   #3: Inferior venacavogram   #4: Insertion of inferior vena cava filter Anesthesia:  Gen. Blood Loss:  See anesthesia record Specimens:  None  Findings:  Tip of the fifth or at health 1-L2 junction  Indications:  The patient had complications from a neurosurgical procedure.  She has not been able to wean off the ventilator and cannot have anticoagulation because of recent bleed.  She has a history of DVT and therefore IVC filter has been recommended.  Procedure:  The patient was identified in the holding area and [facility]  The patient was then placed supine on the table. general anesthesia was administered.  The patient was prepped and draped in the usual sterile fashion.  A time out was called and antibiotics were administered.  Ultrasound was used to identify the right internal jugular vein.  It was widely patent and easily compressible.  A digital ultrasound image was acquired.  1% lidocaine was used for local anesthesia.  A #11 blade was used to make a skin nick.  A micropuncture needle was used to cannulate the right internal jugular vein under ultrasound guidance.  An 018 wire was advanced without resistance followed by micropuncture sheath.  A 035 wire was navigated into the inferior vena cava.  A Omni flush catheter was then inserted down to the vena cava bifurcation and a inferior venacavogram was performed.  This located bilateral renal veins.  There is no a Barrett renal vein anatomy.  The diameter of the vena cava was appropriate for filter placement.  Next the catheter was removed and the filter introducing sheath was inserted.  The filter was then loaded and inserted and then  deployed with the tip landing at the L1-L2 junction.  Sheath was withdrawn and pressure was held for hemostasis.   Disposition:  To PACU in stable condition.   Juleen ChinaV. Wells [name], M.D. [facility] [place] Office: [phone] Pager:  [phone]

## 2015-04-14 NOTE — Progress Notes (Addendum)
Nutrition Follow Up  DOCUMENTATION CODES:   Morbid obesity  INTERVENTION:    TPN per pharmacy  NUTRITION DIAGNOSIS:   Inadequate oral intake related to inability to eat as evidenced by NPO status, ongoing  GOAL:   Patient will meet greater than or equal to 90% of their needs, met  MONITOR:   Vent status, Labs, Weight trends, Skin, I & O's, TPN prescription   ASSESSMENT:   45-year-old with history of asthma, arthritis, obesity. She had an elective admission today for L5 laminectomy. Intra-Op she went into shock, hemodynamic instability. Subsequent CT showed large left retroperitoneal hematoma. She was put on 2 pressors (norepi, epinephrine) and transferred to ICU. PCCM consulted for help with management. Vascular surgery was also consulted. Intra OP TEE showed normal cardiac function.  Patient s/p procedures 3/6: EXPLORATION OF ABDOMINAL WOUND EVACUATION OF HEMATOMA PLACEMENT OF NEGATIVE PRESSURE DRESSING  Patient is currently intubated on ventilator support -- NGT to LIS Temp (24hrs), Avg:98.3 F (36.8 C), Min:97 F (36.1 C), Max:99.9 F (37.7 C)   Propofol: 23.3 ml/hr -----> 615 fat kcals   Patient is receiving TPN via CVC with Clinimix E 5/15 cycled x 14 hours.  Lipids (20% IVFE) on Tuesdays and Thursdays oh hold at this time given Propofol infusion.  TPN + Propofol provides a daily average of 2489 kcals, 132 gm protein per day.    Diet Order:  Diet NPO time specified .TPN (CLINIMIX-E) Adult  Skin:  unstageable pressure ulcer to R nare, abdominal wound VAC  Last BM:  3/5  Height:   Ht Readings from Last 1 Encounters:  03/27/15 5' 3" (1.6 m)    Weight:   Wt Readings from Last 1 Encounters:  04/12/15 285 lb (129.275 kg)    Ideal Body Weight:  52.27 kg (kg)  BMI:  Body mass index is 50.5 kg/(m^2).  Estimated Nutritional Needs:   Kcal:  1419-1806  Protein:  130-140 gm  Fluid:  per MD  EDUCATION NEEDS:   No education needs identified at this  time  Katie Lamberton, RD, LDN Pager #: [phone] After-Hours Pager #: [phone]  

## 2015-04-14 NOTE — Transfer of Care (Signed)
Immediate Anesthesia Transfer of Care Note  Patient: Kristina Estes  Procedure(s) Performed: Procedure(s): INSERTION VENA-CAVA FILTER (N/A)  Patient Location: SICU  Anesthesia Type:General  Level of Consciousness: awake and Patient remains intubated per anesthesia plan  Airway & Oxygen Therapy: Patient remains intubated per anesthesia plan and Patient placed on Ventilator (see vital sign flow sheet for setting)  Post-op Assessment: Report given to RN  Post vital signs: Reviewed and stable  Last Vitals:  Filed Vitals:   04/14/15 1400 04/14/15 1500  BP:    Pulse: 108 110  Temp:    Resp: 20 20    Complications: No apparent anesthesia complications

## 2015-04-14 NOTE — Progress Notes (Signed)
RT note- Patient is in the OR

## 2015-04-14 NOTE — Progress Notes (Signed)
PULMONARY / CRITICAL CARE MEDICINE   Name: Kristina Estes MRN: [id number] DOB: 12/25/1970    ADMISSION DATE:  03/27/2015  CONSULTATION DATE: 03/27/15  REFERRING MD: Conchita ParisNundkumar  CHIEF COMPLAINT: Hemorrhagic shock  SUBJECTIVE:  Drop HCT, blood, open abdo  VITAL SIGNS: BP 134/63 mmHg  Pulse 105  Temp(Src) 98.8 F (37.1 C) (Axillary)  Resp 21  Ht 5\' 3"  (1.6 m)  Wt 129.275 kg (285 lb)  BMI 50.50 kg/m2  SpO2 100%  LMP 03/27/2015  VENTILATOR SETTINGS: Vent Mode:  [-] PRVC FiO2 (%):  [40 %-50 %] 40 % Set Rate:  [20 bmp] 20 bmp Vt Set:  [450 mL] 450 mL PEEP:  [5 cmH20] 5 cmH20 Plateau Pressure:  [10 cmH20-20 cmH20] 11 cmH20  INTAKE / OUTPUT: I/O last 3 completed shifts: In: 6663.7 [I.V.:2650.7; Blood:920; NG/GT:60; IV Piggyback:800] Out: 4610 [Urine:2530; Emesis/NG output:1305; Drains:475; Blood:300]   PHYSICAL EXAMINATION: General: rass -2, FC  Neuro: follows commands HEENT: ett, obese neck Cardiac: s1 s2 rrt tachy improved Chest: redeuced  Abd: open abdo, wound vac, no BS, no r Ext: 2+ edema Skin: no rashes   LABS:  BMET  Recent Labs Lab 04/13/15 1600 04/13/15 2009 04/14/15 0400  NA 138 138 136  K 5.6* 5.1 4.5  CL 106 107 105  CO2 22 21* 21*  BUN 34* 32* 33*  CREATININE 1.07* 1.01* 0.85  GLUCOSE 168* 254* 231*    Electrolytes  Recent Labs Lab 04/07/15 1520 04/08/15 0415  04/09/15 0500  04/13/15 0456  04/13/15 1600 04/13/15 2009 04/14/15 0400  CALCIUM 8.3* 8.5*  < > 8.5*  < > 7.9*  < > 8.2* 7.9* 7.8*  MG  --  2.1  --  2.1  --  2.0  --   --   --   --   PHOS 3.2  --   --  3.5  --  3.9  --   --   --   --   < > = values in this interval not displayed.  CBC  Recent Labs Lab 04/13/15 0910 04/13/15 1414 04/13/15 1600 04/14/15 0400  WBC 39.9*  --  38.6* 26.0*  HGB 6.7* 8.8* 8.4* 6.2*  HCT 20.8* 26.0* 25.1* 18.8*  PLT 495*  --  443* 331    Liver Enzymes  Recent Labs Lab 04/09/15 0500 04/13/15 0456 04/14/15 0400  AST 70* 53* 37   ALT 116* 94* 63*  ALKPHOS 122 106 85  BILITOT 4.9* 3.7* 3.7*  ALBUMIN 1.5* 1.3* 1.4*   Glucose  Recent Labs Lab 04/13/15 1135 04/13/15 1607 04/13/15 1945 04/14/15 0031 04/14/15 0423 04/14/15 0807  GLUCAP 138* 105* 168* 181* 184* 166*    Imaging Dg Chest Port 1 View  04/14/2015  CLINICAL DATA:  Atelectasis. EXAM: PORTABLE CHEST 1 VIEW COMPARISON:  04/13/2015. FINDINGS: Endotracheal tube has been partially withdrawn, its is tip is 3.8 cm above the carina in good anatomic position. NG tube and right PICC line stable position. Heart size stable. Persistent but slightly improving left lower lobe atelectasis and/or infiltrate. No prominent pleural effusion or pneumothorax . IMPRESSION: 1. Endotracheal tube has been partially withdrawn, its tip is 3.8 cm above the carina in good anatomic position . remaining lines and tubes in stable position. 2. Persistent but slightly clearing left lower lobe atelectasis and or infiltrate. Electronically Signed   By: Maisie Fushomas  Register   On: 04/14/2015 07:51   Dg Chest Port 1 View  04/13/2015  CLINICAL DATA:  Endotracheal tube repositioning. EXAM: PORTABLE CHEST 1  VIEW COMPARISON:  Earlier the same date and 04/10/2015. FINDINGS: 1654 hours. The endotracheal tube has been partially withdrawn, although is still at the level of the carina, directed towards the right mainstem bronchus. Right arm PICC extends to the lower SVC level. The nasogastric tube appears unchanged. There is increased atelectasis at the left lung base. The heart size and mediastinal contours are stable. There is no pneumothorax or significant pleural effusion. IMPRESSION: Although improved, the endotracheal tube tip remains low, at the level of the carina. For more optimal positioning, withdrawal of the tube by approximately 4-5 cm recommended. Critical Value/emergent results were called by telephone at the time of interpretation on 04/13/2015 at 5:07 pm to the patient's nurse, Liborio Nixon, who verbally  acknowledged these results. Electronically Signed   By: Carey Bullocks M.D.   On: 04/13/2015 17:08   Dg Chest Port 1 View  04/13/2015  CLINICAL DATA:  Intubation EXAM: PORTABLE CHEST 1 VIEW COMPARISON:  04/10/2015 FINDINGS: Stable right PICC line and nasogastric tube. Endotracheal tube has been placed and extends about 1.9 cm beyond the carina into the right main bronchus. Heart size upper normal and stable. Lungs clear. IMPRESSION: Suggests withdrawing the endotracheal tube by 3-4 cm and repeating the image. Critical Value/emergent results were called by telephone at the time of interpretation on 04/13/2015 at 4:20 pm to the patient's nurse, Liborio Nixon, who verbally acknowledged these results. Electronically Signed   By: Esperanza Heir M.D.   On: 04/13/2015 16:20     STUDIES:  2/17 CT abd/pelvis >>Lt sided retroperitoneal hematoma 2/19 Doppler b/l legs >> DVT Lt common femoral vein 3/6 ct abdo>>>Large complex ventral fluid collection with interspersed gas is is favored to represent a large postsurgical abscess / hematoma with complex internal loculations. Volume estimation of 3000 cubic cm. 2. Small bowel bowel approximates the ventral peritoneal surface and may enter the most caudal aspect of the fluid collection but is not felt to occupy a significant portion of the large fluid collection. 3. Surgical drains within the ventral portion of the fluid collection.  CULTURES: 2/23 Blood >> negative 2/24 Sputum >> Few candida 3/6 OR>>>  ANTIBIOTICS: 2/23 Vancomycin >> 2/26 2/23 Zosyn >> 3/02 3/6 imipenem >>> 3/6 vanc>>>  SIGNIFICANT EVENTS: 2/17 L5 laminectomy >> retroperitoneal hemorrhage; vascular surgery consulted >> Lt common iliac to Lt common femoral bypass 2/19 abdominal washout, wound vac; DVT Lt leg >> start heparin gtt 2/20 General surgery consulted 2/21 To OR 2/23 To OR 2/27 To OR 3/01 To OR >> abdominal wall closure with mesh 3/6- abdo distention, hct drop, to OR now for CT  findings abdo collection that loks complicated  LINES/TUBES: 2/17 IJ CVL >> 2/25 2/17 ETT >> 3/02 2/25 Rt PICC >>  ETT 3/6>>>> Left rad 3/6>>>  DISCUSSION: 45 yo female with back and leg pain from L5-S1 spondylolisthesis.  She had L5 laminectomy on 2/17 >> c/b retroperitoneal hemorrhage.  She required laparotomy with Lt common iliac to common femoral bypass.  Her abdomen has remained open due to edema, and she has required repeated trips to the OR.  She remains on the vent for OR trips.  She has hx of Asthma, GERD.  ASSESSMENT / PLAN:  PULMONARY A: Acute respiratory failure in setting of retroperitoneal hemorrhage. Small ung volumes from abdo / atx pulm edema, r/o trali P:  NO extubation with plan OR trips Wean cpap 5 ps 5 Upright as able with abdo distention Keep same MS Appears overloaded, , lasix, neg balance as goal  CARDIOVASCULAR A: Hemorrhagic shock 2nd to retroperitoneal bleeding >> resolved. Lt leg DVT noted 2/19. P: Will need filter and with active extensive clot would place retrievable today Would think her risk PE very high with peri op now, need filter today  RENAL A:  Mild K rise, at risk rise with prbc P:  Avoid gross overload kvo Consider lasix for continued products  GASTROINTESTINAL A:  S/p laparotomy with difficulty closing abdominal wound due to edema. Nutrition. Large abdo wound fluid collection, hematoma infected? P: To OR wed TNA Protonix for SUP  HEMATOLOGIC A:  Anemia 2nd to acute blood loss, and critical illness. Leukocytosis from abdo colllection P:  F/u CBC post transfusion Lasix Filter needed today  INFECTIOUS A:  HCAP >> completed ABx 3/02. abdo wound infection presumed infected hematoma? P:  Add back stat imi, vanc 3/6, follow OR cultures likely to dc vanc if no enterococcus or mrsa in fluid collection  ENDOCRINE A:  Hyperglycemia. P:  SSI   NEUROLOGIC A:  L5-S1  spondylolisthesis. Sedation. P:  fent Prop Some WUA today, limit with open abdo   Ccm time 30 min  Updated family , mom  Mcarthur Rossetti. Tyson Alias, MD, FACP Pgr: [phone] [facility]

## 2015-04-14 NOTE — Progress Notes (Signed)
   Daily Progress Note  Assessment/Planning: S/p x-lap, Repair L CIV, Ligation distal L CIA, L CIA to CFA bypass, s/p multiple abd vac changes x 4 (03/27/15) S/p Abd closure with mesh (04/08/15), S/p Evac of abd hematoma (04/13/15)   Scheduled for IVC filter but abd left open so pt on vent. so will need to be done in OR.  Some scheduling issues with anesthesia, so might have to roll IVC filter placement onto tomorrow's schedule.  Subjective  - 1 Day Post-Op  No events overnight, continued bleeding   Objective Filed Vitals:   04/14/15 0830 04/14/15 0845 04/14/15 0900 04/14/15 0915  BP:      Pulse:  104 106 105  Temp:  98.8 F (37.1 C)    TempSrc:      Resp:  23 20 21  Height:      Weight:      SpO2: 98% 100% 100% 100%    Intake/Output Summary (Last 24 hours) at 04/14/15 0928 Last data filed at 04/14/15 0800  Gross per 24 hour  Intake 4107.92 ml  Output   3115 ml  Net 992.92 ml   VASC  Warm left foot, edema 1+, faintly palpable DP, L groin bandages  Laboratory CBC    Component Value Date/Time   WBC 26.0* 04/14/2015 0400   HGB 6.2* 04/14/2015 0400   HCT 18.8* 04/14/2015 0400   PLT 331 04/14/2015 0400    BMET    Component Value Date/Time   NA 136 04/14/2015 0400   K 4.5 04/14/2015 0400   CL 105 04/14/2015 0400   CO2 21* 04/14/2015 0400   GLUCOSE 231* 04/14/2015 0400   BUN 33* 04/14/2015 0400   CREATININE 0.85 04/14/2015 0400   CALCIUM 7.8* 04/14/2015 0400   GFRNONAA >60 04/14/2015 0400   GFRAA >60 04/14/2015 0400    Brian Chen, MD [facility] Office: [phone] Pager: [phone]  04/14/2015, 9:28 AM      

## 2015-04-14 NOTE — Progress Notes (Signed)
Patient's hemoglobin was 6.2, 1 Unit of PRBC transfusing. Propofol infusing at 30 mcg and Fentanyl at 200 mcg.

## 2015-04-14 NOTE — Progress Notes (Addendum)
Pt seen this am. Required 2U PRBC transfusion. Currently intubated on propofol, fentanyl but arousable, follows simple commands. Abd vac in place. Plan on IVC filter and re-exploration, possible closure tomorrow. Once extubated, we'll plan on upright lumbar XR, if there isn't significant mobility will allow mobilizing to chair. I spoke with her family, discussed the above. All questions were answered.

## 2015-04-14 NOTE — Progress Notes (Signed)
eLink Physician-Brief Progress Note Patient Name: Kristina Estes DOB: 06/22/1970 MRN: [id number]   Date of Service  04/14/2015  HPI/Events of Note  Anemia - Hgb = 6.2.   eICU Interventions  Will transfuse 1 unit PRBC now.      Intervention Category Intermediate Interventions: Other:  [name] 04/14/2015, 4:30 AM

## 2015-04-15 ENCOUNTER — Encounter (HOSPITAL_COMMUNITY): Payer: Self-pay | Admitting: Surgery

## 2015-04-15 ENCOUNTER — Encounter (HOSPITAL_COMMUNITY)
Admission: RE | Disposition: A | Discharge status: Skilled Nursing Facility | Payer: Self-pay | Source: Ambulatory Visit | Attending: Neurosurgery

## 2015-04-15 ENCOUNTER — Inpatient Hospital Stay (HOSPITAL_COMMUNITY): Payer: PRIVATE HEALTH INSURANCE | Admitting: Anesthesiology

## 2015-04-15 DIAGNOSIS — M4800 Spinal stenosis, site unspecified: Secondary | ICD-10-CM | POA: Diagnosis present

## 2015-04-15 HISTORY — PX: WOUND DEBRIDEMENT: SHX247

## 2015-04-15 HISTORY — PX: DRESSING CHANGE UNDER ANESTHESIA: SHX5237

## 2015-04-15 LAB — COMPREHENSIVE METABOLIC PANEL
ALK PHOS: 98 U/L (ref 38–126)
ALT: 55 U/L — ABNORMAL HIGH (ref 14–54)
ANION GAP: 7 (ref 5–15)
AST: 35 U/L (ref 15–41)
Albumin: 1.5 g/dL — ABNORMAL LOW (ref 3.5–5.0)
BILIRUBIN TOTAL: 2.7 mg/dL — AB (ref 0.3–1.2)
BUN: 27 mg/dL — ABNORMAL HIGH (ref 6–20)
CALCIUM: 7.8 mg/dL — AB (ref 8.9–10.3)
CO2: 24 mmol/L (ref 22–32)
Chloride: 106 mmol/L (ref 101–111)
Creatinine, Ser: 0.88 mg/dL (ref 0.44–1.00)
GFR calc Af Amer: >60 mL/min (ref >60)
GLUCOSE: 159 mg/dL — AB (ref 65–99)
Potassium: 4.6 mmol/L (ref 3.5–5.1)
SODIUM: 137 mmol/L (ref 135–145)
TOTAL PROTEIN: 5.2 g/dL — AB (ref 6.5–8.1)

## 2015-04-15 LAB — TYPE AND SCREEN
ABO/RH(D): O POS
ANTIBODY SCREEN: NEGATIVE
UNIT DIVISION: 0
UNIT DIVISION: 0
UNIT DIVISION: 0
Unit division: 0
Unit division: 0
Unit division: 0

## 2015-04-15 LAB — CBC
HCT: 23.2 % — ABNORMAL LOW (ref 36.0–46.0)
Hemoglobin: 7.9 g/dL — ABNORMAL LOW (ref 12.0–15.0)
MCH: 30.5 pg (ref 26.0–34.0)
MCHC: 34.1 g/dL (ref 30.0–36.0)
MCV: 89.6 fL (ref 78.0–100.0)
PLATELETS: 318 10*3/uL (ref 150–400)
RBC: 2.59 MIL/uL — AB (ref 3.87–5.11)
RDW: 16.7 % — ABNORMAL HIGH (ref 11.5–15.5)
WBC: 20.4 10*3/uL — AB (ref 4.0–10.5)

## 2015-04-15 LAB — GLUCOSE, CAPILLARY
GLUCOSE-CAPILLARY: 154 mg/dL — AB (ref 65–99)
GLUCOSE-CAPILLARY: 94 mg/dL (ref 65–99)
Glucose-Capillary: 169 mg/dL — ABNORMAL HIGH (ref 65–99)
Glucose-Capillary: 103 mg/dL — ABNORMAL HIGH (ref 65–99)
Glucose-Capillary: 110 mg/dL — ABNORMAL HIGH (ref 65–99)

## 2015-04-15 LAB — PROTIME-INR
INR: 1.3 (ref 0.00–1.49)
PROTHROMBIN TIME: 16.3 s — AB (ref 11.6–15.2)

## 2015-04-15 LAB — POCT I-STAT 7, (LYTES, BLD GAS, ICA,H+H)
Bicarbonate: 25.8 mEq/L — ABNORMAL HIGH (ref 20.0–24.0)
CALCIUM ION: 1.04 mmol/L — AB (ref 1.12–1.23)
HEMATOCRIT: 25 % — AB (ref 36.0–46.0)
HEMOGLOBIN: 8.5 g/dL — AB (ref 12.0–15.0)
O2 Saturation: 100 %
PH ART: 7.38 (ref 7.350–7.450)
POTASSIUM: 4.5 mmol/L (ref 3.5–5.1)
SODIUM: 136 mmol/L (ref 135–145)
TCO2: 27 mmol/L (ref 0–100)
pCO2 arterial: 43.4 mmHg (ref 35.0–45.0)
pO2, Arterial: 541 mmHg — ABNORMAL HIGH (ref 80.0–100.0)

## 2015-04-15 SURGERY — REPLACEMENT, DRESSING, WITH ANESTHESIA
Anesthesia: General | Site: Abdomen

## 2015-04-15 MED ORDER — SODIUM CHLORIDE 0.9 % IR SOLN
Status: DC | PRN
  Administered 2015-04-15: 500 mL

## 2015-04-15 MED ORDER — LACTATED RINGERS IV SOLN
INTRAVENOUS | Status: DC | PRN
  Administered 2015-04-15: 08:00:00 via INTRAVENOUS

## 2015-04-15 MED ORDER — M.V.I. ADULT IV INJ
INJECTION | INTRAVENOUS | Status: AC
  Administered 2015-04-15: 18:00:00 via INTRAVENOUS
  Filled 2015-04-15: qty 1920

## 2015-04-15 MED ORDER — MIDAZOLAM HCL 5 MG/5ML IJ SOLN
INTRAMUSCULAR | Status: DC | PRN
  Administered 2015-04-15: 2 mg via INTRAVENOUS

## 2015-04-15 MED ORDER — LIDOCAINE HCL (CARDIAC) 20 MG/ML IV SOLN
INTRAVENOUS | Status: AC
  Filled 2015-04-15: qty 5

## 2015-04-15 MED ORDER — PROPOFOL 10 MG/ML IV BOLUS
INTRAVENOUS | Status: AC
  Filled 2015-04-15: qty 20

## 2015-04-15 MED ORDER — ROCURONIUM BROMIDE 50 MG/5ML IV SOLN
INTRAVENOUS | Status: AC
  Filled 2015-04-15: qty 1

## 2015-04-15 MED ORDER — MIDAZOLAM HCL 2 MG/2ML IJ SOLN
INTRAMUSCULAR | Status: AC
  Filled 2015-04-15: qty 2

## 2015-04-15 MED ORDER — ONDANSETRON HCL 4 MG/2ML IJ SOLN
INTRAMUSCULAR | Status: AC
  Filled 2015-04-15: qty 2

## 2015-04-15 MED ORDER — ROCURONIUM BROMIDE 100 MG/10ML IV SOLN
INTRAVENOUS | Status: DC | PRN
  Administered 2015-04-15 (×3): 50 mg via INTRAVENOUS

## 2015-04-15 MED ORDER — 0.9 % SODIUM CHLORIDE (POUR BTL) OPTIME
TOPICAL | Status: DC | PRN
  Administered 2015-04-15: 2000 mL

## 2015-04-15 MED ORDER — SODIUM CHLORIDE 0.9 % IV SOLN
2.0000 g | Freq: Once | INTRAVENOUS | Status: AC
  Administered 2015-04-15: 2 g via INTRAVENOUS
  Filled 2015-04-15: qty 20

## 2015-04-15 SURGICAL SUPPLY — 44 items
BAG DECANTER FOR FLEXI CONT (MISCELLANEOUS) ×4 IMPLANT
BINDER ABDOMINAL 12 ML 46-62 (SOFTGOODS) ×4 IMPLANT
BIOPATCH RED 1 DISK 7.0 (GAUZE/BANDAGES/DRESSINGS) ×6 IMPLANT
BIOPATCH RED 1IN DISK 7.0MM (GAUZE/BANDAGES/DRESSINGS) ×2
CANISTER WOUND CARE 500ML ATS (WOUND CARE) IMPLANT
COVER SURGICAL LIGHT HANDLE (MISCELLANEOUS) ×4 IMPLANT
DERMABOND ADVANCED (GAUZE/BANDAGES/DRESSINGS) ×2
DERMABOND ADVANCED .7 DNX12 (GAUZE/BANDAGES/DRESSINGS) ×2 IMPLANT
DRAIN CHANNEL 19F RND (DRAIN) ×8 IMPLANT
DRAPE LAPAROSCOPIC ABDOMINAL (DRAPES) ×4 IMPLANT
DRAPE UTILITY XL STRL (DRAPES) ×4 IMPLANT
DRAPE WARM FLUID 44X44 (DRAPE) ×4 IMPLANT
DRSG PAD ABDOMINAL 8X10 ST (GAUZE/BANDAGES/DRESSINGS) ×8 IMPLANT
ELECT REM PT RETURN 9FT ADLT (ELECTROSURGICAL) ×4
ELECTRODE REM PT RTRN 9FT ADLT (ELECTROSURGICAL) ×2 IMPLANT
EVACUATOR SILICONE 100CC (DRAIN) ×8 IMPLANT
GAUZE SPONGE 4X4 12PLY STRL (GAUZE/BANDAGES/DRESSINGS) ×12 IMPLANT
GLOVE BIOGEL PI IND STRL 7.5 (GLOVE) ×2 IMPLANT
GLOVE BIOGEL PI INDICATOR 7.5 (GLOVE) ×2
GLOVE EUDERMIC 7 POWDERFREE (GLOVE) ×4 IMPLANT
GLOVE SURG SS PI 7.0 STRL IVOR (GLOVE) ×4 IMPLANT
GLOVE SURG SS PI 7.5 STRL IVOR (GLOVE) ×4 IMPLANT
GOWN STRL REUS W/ TWL LRG LVL3 (GOWN DISPOSABLE) ×2 IMPLANT
GOWN STRL REUS W/ TWL XL LVL3 (GOWN DISPOSABLE) ×2 IMPLANT
GOWN STRL REUS W/TWL LRG LVL3 (GOWN DISPOSABLE) ×2
GOWN STRL REUS W/TWL XL LVL3 (GOWN DISPOSABLE) ×2
HANDPIECE INTERPULSE COAX TIP (DISPOSABLE)
KIT BASIN OR (CUSTOM PROCEDURE TRAY) ×4 IMPLANT
KIT ROOM TURNOVER OR (KITS) ×4 IMPLANT
NS IRRIG 1000ML POUR BTL (IV SOLUTION) ×8 IMPLANT
PACK GENERAL/GYN (CUSTOM PROCEDURE TRAY) ×4 IMPLANT
PAD ARMBOARD 7.5X6 YLW CONV (MISCELLANEOUS) ×12 IMPLANT
SET HNDPC FAN SPRY TIP SCT (DISPOSABLE) IMPLANT
SPONGE ABDOMINAL VAC ABTHERA (MISCELLANEOUS) IMPLANT
STAPLER VISISTAT 35W (STAPLE) ×4 IMPLANT
SUCTION POOLE TIP (SUCTIONS) IMPLANT
SUT ETHILON 2 0 PSLX (SUTURE) ×28 IMPLANT
SUT ETHILON 3 0 FSL (SUTURE) ×8 IMPLANT
SUT VIC AB 2 TP1 27 (SUTURE) ×16 IMPLANT
TAPE CLOTH SURG 6X10 WHT LF (GAUZE/BANDAGES/DRESSINGS) ×4 IMPLANT
TOWEL OR 17X24 6PK STRL BLUE (TOWEL DISPOSABLE) IMPLANT
TOWEL OR 17X26 10 PK STRL BLUE (TOWEL DISPOSABLE) ×4 IMPLANT
UNDERPAD 30X30 INCONTINENT (UNDERPADS AND DIAPERS) IMPLANT
WATER STERILE IRR 1000ML POUR (IV SOLUTION) IMPLANT

## 2015-04-15 NOTE — Anesthesia Postprocedure Evaluation (Signed)
Anesthesia Post Note  Patient: Jackquline BoschLaverne Smiddy  Procedure(s) Performed: Procedure(s) (LRB): Exploration of abdominal wound (N/A) CLOSURE OF ABDOMINAL WOUND (Bilateral)  Patient location during evaluation: SICU Anesthesia Type: General Level of consciousness: sedated Pain management: pain level controlled Vital Signs Assessment: post-procedure vital signs reviewed and stable Respiratory status: patient remains intubated per anesthesia plan Cardiovascular status: stable Anesthetic complications: no    Last Vitals:  Filed Vitals:   04/15/15 0800 04/15/15 1020  BP: 99/49   Pulse: 106 127  Temp:    Resp: 20 20    Last Pain:  Filed Vitals:   04/15/15 1033  PainSc: 2                  Saddie Sandeen,W. EDMOND

## 2015-04-15 NOTE — Progress Notes (Signed)
RT note-Patient taken to OR for procedure.

## 2015-04-15 NOTE — Progress Notes (Signed)
eLink Physician-Brief Progress Note Patient Name: Kristina Estes DOB: 04/07/1970 MRN: [id number]   Date of Service  04/15/2015  HPI/Events of Note  Camera check on patient post abdominal wound closure today. Family at bedside. Patient hemodynamically stable and resting comfortably with eyes closed.  eICU Interventions  Continue further ICU care as planned.     Intervention Category Major Interventions: Respiratory failure - evaluation and management  Lawanda CousinsJennings Shay Bartoli 04/15/2015, 4:42 PM

## 2015-04-15 NOTE — Progress Notes (Signed)
   Daily Progress Note  Assessment/Planning: S/p X-lap, Repair L CIV, Ligation distal L CIA, L CIA to CFA bypass, s/p multiple abd vac changes x 4 (03/27/15) S/p Abd closure with mesh (04/08/15), S/p Evac of abd hematoma (04/13/15) S/p IVC filter placement (04/14/15) S/p Closure of abd (04/15/15)   Right neck incision ok  Left groin ok with superficial separation: sterile-strips removed and skin reinforced with Dermabond.  Keep sterile dressing on incision  Subjective  - Day of Surgery  Just back from OR abd closure, no events overnight  Objective Filed Vitals:   04/15/15 0411 04/15/15 0500 04/15/15 0800 04/15/15 1020  BP:  116/55 99/49   Pulse:  120 106 127  Temp: 98.8 F (37.1 C)     TempSrc: Oral     Resp:  24 20 20   Height:      Weight:      SpO2:  100% 99% 100%    Intake/Output Summary (Last 24 hours) at 04/15/15 1031 Last data filed at 04/15/15 1020  Gross per 24 hour  Intake 5386.23 ml  Output   5925 ml  Net -538.77 ml   VASC  Warm left foot, most of L groin incision healed, two small areas of skin defects in superior aspect of incision, staples out  Laboratory CBC    Component Value Date/Time   WBC 20.4* 04/15/2015 0413   HGB 7.9* 04/15/2015 0413   HCT 23.2* 04/15/2015 0413   PLT 318 04/15/2015 0413    BMET    Component Value Date/Time   NA 137 04/15/2015 0413   K 4.6 04/15/2015 0413   CL 106 04/15/2015 0413   CO2 24 04/15/2015 0413   GLUCOSE 159* 04/15/2015 0413   BUN 27* 04/15/2015 0413   CREATININE 0.88 04/15/2015 0413   CALCIUM 7.8* 04/15/2015 0413   GFRNONAA >60 04/15/2015 0413   GFRAA >60 04/15/2015 0413    Leonides SakeBrian Chen, MD [facility] [place] Office: [phone] Pager: [phone]  04/15/2015, 10:31 AM

## 2015-04-15 NOTE — Transfer of Care (Signed)
Immediate Anesthesia Transfer of Care Note  Patient: Kristina Estes  Procedure(s) Performed: Procedure(s): Exploration of abdominal wound (N/A) CLOSURE OF ABDOMINAL WOUND (Bilateral)  Patient Location: SICU  Anesthesia Type:General  Level of Consciousness: Patient remains intubated per anesthesia plan  Airway & Oxygen Therapy: Patient remains intubated per anesthesia plan and Patient placed on Ventilator (see vital sign flow sheet for setting)  Post-op Assessment: Report given to RN and Post -op Vital signs reviewed and stable  Post vital signs: Reviewed and stable  Last Vitals:  Filed Vitals:   04/15/15 0800 04/15/15 1020  BP: 99/49   Pulse: 106 127  Temp:    Resp: 20 20    Complications: No apparent anesthesia complications

## 2015-04-15 NOTE — Progress Notes (Signed)
Patient remains intubated- Received successful abdominal wound closure procedure on today 04/15/15. CSW will continue to follow for disposition.   Noe GensAshley Gardner, LCSW [facility] Clinical Social Worker [phone]

## 2015-04-15 NOTE — Progress Notes (Signed)
PARENTERAL NUTRITION CONSULT NOTE - FOLLOW UP  Pharmacy Consult:  TPN Indication:  Open abdomen s/p closure  Allergies  Allergen Reactions  . Propoxyphene Other (See Comments)    Unspecified    Patient Measurements: Height: 5\' 3"  (160 cm) Weight: 285 lb (129.275 kg) IBW/kg (Calculated) : 52.4  BMI = 59.7  Vital Signs: Temp: 98.8 F (37.1 C) (03/08 0411) Temp Source: Oral (03/08 0411) BP: 99/49 mmHg (03/08 0800) Pulse Rate: 106 (03/08 0800) Intake/Output from previous day: 03/07 0701 - 03/08 0700 In: 5818.2 [I.V.:1763.2; Blood:619; IV Piggyback:800; TPN:2636] Out: 5835 [Urine:5100; Emesis/NG output:410; Drains:300; Blood:25]  Labs:  Recent Labs  04/13/15 0910  04/13/15 1600 04/14/15 0400 04/14/15 1610 04/15/15 0413  WBC 39.9*  --  38.6* 26.0*  --  20.4*  HGB 6.7*  < > 8.4* 6.2* 8.5* 7.9*  HCT 20.8*  < > 25.1* 18.8* 25.0* 23.2*  PLT 495*  --  443* 331  --  318  APTT 43*  --   --   --   --   --   INR 1.34  --   --   --   --  1.30  < > = values in this interval not displayed.   Recent Labs  04/13/15 0456  04/13/15 1600 04/13/15 2009 04/14/15 0400 04/14/15 1610 04/15/15 0413  NA 135  < > 138 138 136 136 137  K 5.0  < > 5.6* 5.1 4.5 4.5 4.6  CL 105  < > 106 107 105  --  106  CO2 23  < > 22 21* 21*  --  24  GLUCOSE 296*  < > 168* 254* 231*  --  159*  BUN 32*  < > 34* 32* 33*  --  27*  CREATININE 0.93  < > 1.07* 1.01* 0.85  --  0.88  CALCIUM 7.9*  < > 8.2* 7.9* 7.8*  --  7.8*  MG 2.0  --   --   --   --   --   --   PHOS 3.9  --   --   --   --   --   --   PROT 5.4*  --   --   --  4.6*  --  5.2*  ALBUMIN 1.3*  --   --   --  1.4*  --  1.5*  AST 53*  --   --   --  37  --  35  ALT 94*  --   --   --  63*  --  55*  ALKPHOS 106  --   --   --  85  --  98  BILITOT 3.7*  --   --   --  3.7*  --  2.7*  PREALBUMIN 14.1*  --   --   --   --   --   --   TRIG 194*  --  318*  --   --   --   --   < > = values in this interval not displayed. Estimated Creatinine Clearance:  106 mL/min (by C-G formula based on Cr of 0.88).    Recent Labs  04/14/15 1926 04/14/15 2354 04/15/15 0353  GLUCAP 82 154* 169*     Insulin Requirements in the past 24 hours:  12 units SSI + 35 units of regular insulin in TPN  Assessment: 44 YOF admitted on 03/27/15 with back and leg pain and underwent L5 laminectomy with facetectomy later that day.  Intraoperatively patient went  into shock and CT revealed large left RP hematoma.  Patient returned to the OR for ex-lap with repair of left iliac vein, ligation of distal left CIA, left CIA-CFA bypass, and placement of abdominal VAC.  Pharmacy consulted to manage TPN for nutritional support.  GI: hx GERD / obesity.  Baseline prealbumin low at 8.4 >> 14.1 (frequent OR visits for abd washout) - s/p abdominal wall closure with mesh on 3/1.  Found to have a large hematoma of the abd wall 3/6 nut no active bleeding.  Still having N/V despite NGT.  Emesis/NG O/P , drains .  PPI IV, Reglan   Endo: no hx DM - hypoglycemic prior to TPN initiation.  CBGs controlled at peak infusion, hypoglycemia while off TPN (35 units insulin in TPN) Lytes: all WNL except slightly low iCa Renal: SCr stable, CrCL > 100 ml/min - good UOP 1.6 ml/kg/hr, NS at 10 ml/hr Pulm: extubated 3/2, reintubated 3/6 - FiO2 40% Cards: angina - BP soft, sinus tach - Lasix IV BID AC: holding heparin for new left femoral DVT (dx 03/29/15) d/t bleeding, s/p IVC filter placement 3/7 - hgb 7.9, plts WNL, INR 1.3 Hepatobil: ALT mildly elevated, tbili improved to 2.7 (RN noted a little jaundice in sclera on 3/3, resolved now). TG checked same day and got 194 then 318 on 3/6 Neuro: Fentanyl/Propofol gtts - GCS 13, CPOT 0, RASS at goal of -2 ID: Vanc/Primaxin - afebrile, WBC elevated at 20.4 Best Practices: holding heparin gtt, MC TPN Access: right IJ placed 2/17>>2/25, triple lumen PICC 2/25>> TPN start date: 2/21 >>  Current Nutrition:  Clinimix  Propofol at 23.3 ml/hr (started  3/6) = 615 kCal  Nutritional Goals: 1419-1806 kCal and 130-140 gm protein per day   Plan:  - Transition back to 24 hour infusion given change in clinical status:  Clinimix E 5/15 at 80 ml/hr - Propofol at 23.3 ml/hr.  Hold lipid to minimize kCal provision. - Clinimix + Propofol will provide 1978 kCal and 96gm protein per day, exceeding kCal need by 10% and meeting 74% of minimal protein need.  Difficult to meet protein need without overly exceeding kCal need. - Continue multivitamin and trace elements in TPN - Reduce regular insulin in TPN to 7 units and continue moderate SSI - Ca gluconate 2gm IV x 1 - TG in AM + standard TPN labs - F/U with weaning Propofol to maximize TPN provision   Elzena Muston D. Laney Potash, PharmD, BCPS Pager:  [phone] 04/15/2015, 9:12 AM

## 2015-04-15 NOTE — Op Note (Signed)
Patient Name:           Kristina Estes   Date of Surgery:        04/15/2015  Pre op Diagnosis:      Abdominal wall hematoma, status post evacuation and placement of negative pressure dressing  Post op Diagnosis:   Same  Procedure:                 Exploration of abdominal wall wound, evacuation residual hematoma, closure of skin and subcutaneous tissue over drains  Surgeon:                     Angelia MouldHaywood M. Derrell LollingIngram, M.D., FACS  Assistant:                      OR staff  Operative Indications:   This is a 45 year old female who recently underwent elective lumbar spine surgery. That was complicated by severe injury to the left iliac artery and vein necessitating exploration, ligation, and iliac to common femoral bypass. She was treated with an open abdomen for a week or 2, requiring ventilator support. About 5 days ago she was returned to the operating room to see if the wound to be closed. The fascial defect would not close and so it was bridged with a large sheet of phasic mesh. She  came off the ventilator. Deep venous thrombosis was identified and she had been a heparin drip. Her abdomen  become distended  48 hours ago and she had developed an acute blood loss anemia. The heparin drip was stopped and she was returned to the operating room  On 04/13/2015 for evacuation of a huge partially clotted abdominal wall hematoma, at least 6 units of blood in the space between the fascial closure and the subcutaneous tissue.  The mesh repair was intact. Negative pressure dressing was placed.  She has remained on the ventilator and has remained stable.  She required some further transfusion.  She is return to the operating room for wound exploration and hopefully closure over drains  Operative Findings:       There was no evidence of infection.  There was no evidence of active bleeding.  There was some residual hematoma under the skin flaps.  There was no enteric odor or drainage.  The fascial repair remained  intact.  We were able to perform a primary closure over drains  Procedure in Detail:         The patient was brought directly from the ICU to the operating room intubated.  General anesthesia was induced.  The negative pressure dressing was removed and the abdomen prepped and draped in a sterile fashion.  Surgical timeout was performed.  She was up-to-date on her antibiotics.     The wound was explored and irrigated with findings as described above.  After cleaning up all of the residual hematoma we irrigated the wound with antibiotic irrigating solution.  It appeared favorable for primary closure.  I placed [address] [place] drains in the wound and brought these out through separate stab incisions in the upper abdomen.  The 16 use tissue was closed with interrupted #2 Vicryl and the skin closed with skin staples and numerous 2-0 nylon sutures.  The drains held the charge.  Clean bandages and abdominal binder were placed.  The patient was returned to the ICU stable but still intubated and ventilator dependent.  May blood loss less than 25 mL.  Counts correct.  Complications none.  Angelia Mould. Derrell Lolling, M.D., FACS General and Minimally Invasive Surgery Breast and Colorectal Surgery  04/15/2015 9:45 AM

## 2015-04-15 NOTE — OR Nursing (Signed)
Late entry at 1003 to correct surgical wound class.

## 2015-04-15 NOTE — Progress Notes (Signed)
PULMONARY / CRITICAL CARE MEDICINE   Name: Kristina Estes MRN: [id number] DOB: 06/05/1970    ADMISSION DATE:  03/27/2015  CONSULTATION DATE: 03/27/15  REFERRING MD: Conchita ParisNundkumar  CHIEF COMPLAINT: Hemorrhagic shock  SUBJECTIVE:  Back from OR.  VITAL SIGNS: BP 134/48 mmHg  Pulse 130  Temp(Src) 98.8 F (37.1 C) (Oral)  Resp 27  Ht 5\' 3"  (1.6 m)  Wt 285 lb (129.275 kg)  BMI 50.50 kg/m2  SpO2 100%  LMP 03/27/2015  VENTILATOR SETTINGS: Vent Mode:  [-] PRVC FiO2 (%):  [40 %] 40 % Set Rate:  [20 bmp] 20 bmp Vt Set:  [450 mL] 450 mL PEEP:  [5 cmH20] 5 cmH20 Plateau Pressure:  [9 cmH20-18 cmH20] 10 cmH20  INTAKE / OUTPUT: I/O last 3 completed shifts: In: 7412.3 [I.V.:3157.3; Blood:619; IV Piggyback:1000] Out: 7165 [Urine:6130; Emesis/NG output:485; Drains:525; Blood:25]   PHYSICAL EXAMINATION: General: rass -2, FC  Neuro: follows commands HEENT: ett, obese neck Cardiac: s1 s2 rrt tachy improved Chest: redeuced  Abd: open abdo, wound vac, no BS, no r Ext: 2+ edema Skin: no rashes   LABS:  BMET  Recent Labs Lab 04/13/15 2009 04/14/15 0400 04/14/15 1610 04/15/15 0413  NA 138 136 136 137  K 5.1 4.5 4.5 4.6  CL 107 105  --  106  CO2 21* 21*  --  24  BUN 32* 33*  --  27*  CREATININE 1.01* 0.85  --  0.88  GLUCOSE 254* 231*  --  159*    Electrolytes  Recent Labs Lab 04/09/15 0500  04/13/15 0456  04/13/15 2009 04/14/15 0400 04/15/15 0413  CALCIUM 8.5*  < > 7.9*  < > 7.9* 7.8* 7.8*  MG 2.1  --  2.0  --   --   --   --   PHOS 3.5  --  3.9  --   --   --   --   < > = values in this interval not displayed.  CBC  Recent Labs Lab 04/13/15 1600 04/14/15 0400 04/14/15 1610 04/15/15 0413  WBC 38.6* 26.0*  --  20.4*  HGB 8.4* 6.2* 8.5* 7.9*  HCT 25.1* 18.8* 25.0* 23.2*  PLT 443* 331  --  318    Liver Enzymes  Recent Labs Lab 04/13/15 0456 04/14/15 0400 04/15/15 0413  AST 53* 37 35  ALT 94* 63* 55*  ALKPHOS 106 85 98  BILITOT 3.7* 3.7* 2.7*   ALBUMIN 1.3* 1.4* 1.5*   Glucose  Recent Labs Lab 04/14/15 1141 04/14/15 1812 04/14/15 1845 04/14/15 1926 04/14/15 2354 04/15/15 0353  GLUCAP 84 68 93 82 154* 169*    Imaging No results found.   STUDIES:  2/17 CT abd/pelvis >>Lt sided retroperitoneal hematoma 2/19 Doppler b/l legs >> DVT Lt common femoral vein 3/6 ct abdo>>>Large complex ventral fluid collection with interspersed gas is is favored to represent a large postsurgical abscess / hematoma with complex internal loculations. Volume estimation of 3000 cubic cm. 2. Small bowel bowel approximates the ventral peritoneal surface and may enter the most caudal aspect of the fluid collection but is not felt to occupy a significant portion of the large fluid collection. 3. Surgical drains within the ventral portion of the fluid collection.  CULTURES: 2/23 Blood >> negative 2/24 Sputum >> Few candida 3/6 OR>>>  ANTIBIOTICS: 2/23 Vancomycin >> 2/26 2/23 Zosyn >> 3/02 3/6 imipenem >>> 3/6 vanc>>>  SIGNIFICANT EVENTS: 2/17 L5 laminectomy >> retroperitoneal hemorrhage; vascular surgery consulted >> Lt common iliac to Lt common femoral bypass 2/19  abdominal washout, wound vac; DVT Lt leg >> start heparin gtt 2/20 General surgery consulted 2/21 To OR 2/23 To OR 2/27 To OR 3/01 To OR >> abdominal wall closure with mesh 3/6- abdo distention, hct drop, to OR now for CT findings abdo collection that loks complicated  LINES/TUBES: 2/17 IJ CVL >> 2/25 2/17 ETT >> 3/02 2/25 Rt PICC >>  ETT 3/6>>>> Left rad 3/6>>>  DISCUSSION: 45 yo female with back and leg pain from L5-S1 spondylolisthesis.  She had L5 laminectomy on 2/17 >> c/b retroperitoneal hemorrhage.  She required laparotomy with Lt common iliac to common femoral bypass.  Her abdomen has remained open due to edema, and she has required repeated trips to the OR.  She remains on the vent for OR trips.  She has hx of Asthma, GERD.  ASSESSMENT /  PLAN:  PULMONARY A: Acute respiratory failure in setting of retroperitoneal hemorrhage. Small ung volumes from abdo / atx pulm edema, r/o trali P:  Wean vent when okay with surgery  CARDIOVASCULAR A: Hemorrhagic shock 2nd to retroperitoneal bleeding >> resolved. Lt leg DVT noted 2/19. P: IVC filter  RENAL A:  No acute issues. P:  F/u renal fx  GASTROINTESTINAL A:  S/p laparotomy with difficulty closing abdominal wound due to edema. Nutrition. Large abdo wound fluid collection, hematoma infected? P: Post op care per surgery  HEMATOLOGIC A:  Anemia 2nd to acute blood loss, and critical illness. P:  F/u CBC  INFECTIOUS A:  HCAP >> completed ABx 3/02. abdo wound infection presumed infected hematoma? P:  Continue Abx for now  ENDOCRINE A:  Hyperglycemia. P:  SSI  NEUROLOGIC A:  L5-S1 spondylolisthesis. Sedation. P:  RASS goal -1  Updated pt's husband at bedside.  CC time 34 minutes.  Coralyn Helling, MD [facility] Pulmonary/Critical Care 04/15/2015, 11:50 AM Pager:  [phone] After 3pm call: [phone]

## 2015-04-15 NOTE — Progress Notes (Signed)
1 Day Post-Op  Subjective: Hemodynamically stable off pressors.  Remains on ventilator but more alert and answering questions.  Minimal pain.  Good urine output.  Vena cava filter placed successfully yesterday afternoon.  Per vascular surgery.  Apparently received 2 units PRBC yesterday. CBC pending.  INR 1.30.  Glucose 159.  Creatinine 0.88.  Bilirubin 2.7.  Plan to return to OR this morning under anesthesia for dressing change, washout, possible closure. If closure successful, hopefully can wean from ventilator and mobilized with guidelines from neurosurgery.  Objective: Vital signs in last 24 hours: Temp:  [97 F (36.1 C)-98.9 F (37.2 C)] 98.8 F (37.1 C) (03/08 0411) Pulse Rate:  [29-125] 120 (03/08 0500) Resp:  [18-25] 24 (03/08 0500) BP: (92-167)/(50-72) 116/55 mmHg (03/08 0500) SpO2:  [88 %-100 %] 100 % (03/08 0500) Arterial Line BP: (70-162)/(49-146) 137/116 mmHg (03/08 0500) FiO2 (%):  [40 %] 40 % (03/08 0312) Last BM Date: 04/12/15  Intake/Output from previous day: 03/07 0701 - 03/08 0700 In: 4919.6 [I.V.:1676.6; Blood:619; IV Piggyback:800; TPN:1824] Out: 5535 [Urine:4850; Emesis/NG output:410; Drains:250; Blood:25] Intake/Output this shift: Total I/O In: 2513.6 [I.V.:489.6; IV Piggyback:400; TPN:1624] Out: 2425 [Urine:2150; Emesis/NG output:200; Drains:75]     EXAM: General appearance: Intubated.  Opens eyes and answers questions appropriately.  Not agitated.  Skin warm and dry Resp: clear to auscultation bilaterally GI: Abdomen generally soft and minimally tender.  Midline wound clean with negative pressure dressing.  Drainage serosanguineous.  Lab Results:  Results for orders placed or performed during the hospital encounter of 03/27/15 (from the past 24 hour(s))  Glucose, capillary     Status: Abnormal   Collection Time: 04/14/15  8:07 AM  Result Value Ref Range   Glucose-Capillary 166 (H) 65 - 99 mg/dL   Comment 1 Capillary Specimen    Comment 2  Notify RN   Prepare RBC     Status: None   Collection Time: 04/14/15  8:26 AM  Result Value Ref Range   Order Confirmation ORDER PROCESSED BY BLOOD BANK   Glucose, capillary     Status: None   Collection Time: 04/14/15 11:41 AM  Result Value Ref Range   Glucose-Capillary 84 65 - 99 mg/dL   Comment 1 Capillary Specimen    Comment 2 Notify RN   Glucose, capillary     Status: None   Collection Time: 04/14/15  6:12 PM  Result Value Ref Range   Glucose-Capillary 68 65 - 99 mg/dL  Glucose, capillary     Status: None   Collection Time: 04/14/15  6:45 PM  Result Value Ref Range   Glucose-Capillary 93 65 - 99 mg/dL  Glucose, capillary     Status: None   Collection Time: 04/14/15  7:26 PM  Result Value Ref Range   Glucose-Capillary 82 65 - 99 mg/dL   Comment 1 Capillary Specimen    Comment 2 Notify RN    Comment 3 Document in Chart   Glucose, capillary     Status: Abnormal   Collection Time: 04/14/15 11:54 PM  Result Value Ref Range   Glucose-Capillary 154 (H) 65 - 99 mg/dL   Comment 1 Capillary Specimen    Comment 2 Notify RN    Comment 3 Document in Chart   Glucose, capillary     Status: Abnormal   Collection Time: 04/15/15  3:53 AM  Result Value Ref Range   Glucose-Capillary 169 (H) 65 - 99 mg/dL   Comment 1 Capillary Specimen    Comment 2 Notify RN  Comment 3 Document in Chart   Comprehensive metabolic panel     Status: Abnormal   Collection Time: 04/15/15  4:13 AM  Result Value Ref Range   Sodium 137 135 - 145 mmol/L   Potassium 4.6 3.5 - 5.1 mmol/L   Chloride 106 101 - 111 mmol/L   CO2 24 22 - 32 mmol/L   Glucose, Bld 159 (H) 65 - 99 mg/dL   BUN 27 (H) 6 - 20 mg/dL   Creatinine, Ser 1.61 0.44 - 1.00 mg/dL   Calcium 7.8 (L) 8.9 - 10.3 mg/dL   Total Protein 5.2 (L) 6.5 - 8.1 g/dL   Albumin 1.5 (L) 3.5 - 5.0 g/dL   AST 35 15 - 41 U/L   ALT 55 (H) 14 - 54 U/L   Alkaline Phosphatase 98 38 - 126 U/L   Total Bilirubin 2.7 (H) 0.3 - 1.2 mg/dL   GFR calc non Af Amer >60  >60 mL/min   GFR calc Af Amer >60 >60 mL/min   Anion gap 7 5 - 15  Protime-INR     Status: Abnormal   Collection Time: 04/15/15  4:13 AM  Result Value Ref Range   Prothrombin Time 16.3 (H) 11.6 - 15.2 seconds   INR 1.30 0.00 - 1.49     Studies/Results: Dg Chest Port 1 View  04/14/2015  CLINICAL DATA:  Atelectasis. EXAM: PORTABLE CHEST 1 VIEW COMPARISON:  04/13/2015. FINDINGS: Endotracheal tube has been partially withdrawn, its is tip is 3.8 cm above the carina in good anatomic position. NG tube and right PICC line stable position. Heart size stable. Persistent but slightly improving left lower lobe atelectasis and/or infiltrate. No prominent pleural effusion or pneumothorax . IMPRESSION: 1. Endotracheal tube has been partially withdrawn, its tip is 3.8 cm above the carina in good anatomic position . remaining lines and tubes in stable position. 2. Persistent but slightly clearing left lower lobe atelectasis and or infiltrate. Electronically Signed   By: Maisie Fus  Register   On: 04/14/2015 07:51    . sodium chloride   Intravenous Once  . sodium chloride   Intravenous Once  . sodium chloride   Intravenous Once  . antiseptic oral rinse  7 mL Mouth Rinse QID  . chlorhexidine gluconate  15 mL Mouth Rinse BID  . furosemide  40 mg Intravenous Q12H  . imipenem-cilastatin  500 mg Intravenous 4 times per day  . insulin aspart  0-20 Units Subcutaneous 6 times per day  . metoCLOPramide (REGLAN) injection  10 mg Intravenous 4 times per day  . pantoprazole (PROTONIX) IV  40 mg Intravenous Q24H  . sodium chloride flush  10-40 mL Intracatheter Q12H  . vancomycin  1,000 mg Intravenous Q12H     Assessment/Plan: s/p Procedure(s): INSERTION VENA-CAVA FILTER    S/p L5 laminectomy (2/17/7 - Dr. Conchita Paris)  Left iliac vein and left common iliac artery bleeding  S/p Ex lap, repair of left iliac vein, ligation of distal left common iliac artery, left common iliac artery to left common femoral bypass  with 8mm Dacron, placement of abdominal negative pressure dressing (03/27/15 - Early and Imogene Burn)  Subsequently 4 abdominal washouts and wound vac changes 2/19, 2/21, 2/23, 2/27 Imogene Burn) POD#6 s/p exploratory laparotomy, abdominal wound closure with Phasix mesh, drain placement  Post-operative abdominal wall hematoma Derrell Lolling) POD #2 s/p Exploration of abdominal wound, evacuation of hematoma, placement of negative pressure dressing (Akram Kissick)(04/13/2015) -CCM helping to manage -NPO, IVF, pain control, antiemetics -Continue NGT decompression -To OR today with Dr. Derrell Lolling  for abdominal wound exploration, dressing change, and possible abdominal wound closure   ABLA-CBC pending this morning. Leukocytosis- on Primaxin and Vancomycin Day #2 PCM-TPN for now Left common iliac DVT-heparin discontinued due to massive abdominal wall bleed.  IVC filter placed yesterday. Disp - OR today for dressing change, possible abdominal wall closure.  @  LOS: 19 days    Ranya Fiddler M 04/15/2015  . .prob

## 2015-04-16 ENCOUNTER — Encounter (HOSPITAL_COMMUNITY): Payer: Self-pay | Admitting: General Surgery

## 2015-04-16 ENCOUNTER — Inpatient Hospital Stay (HOSPITAL_COMMUNITY): Payer: PRIVATE HEALTH INSURANCE

## 2015-04-16 LAB — CBC
HCT: 26.1 % — ABNORMAL LOW (ref 36.0–46.0)
HEMOGLOBIN: 8.6 g/dL — AB (ref 12.0–15.0)
MCH: 30.4 pg (ref 26.0–34.0)
MCHC: 33 g/dL (ref 30.0–36.0)
MCV: 92.2 fL (ref 78.0–100.0)
PLATELETS: 343 10*3/uL (ref 150–400)
RBC: 2.83 MIL/uL — AB (ref 3.87–5.11)
RDW: 17.7 % — ABNORMAL HIGH (ref 11.5–15.5)
WBC: 15.2 10*3/uL — ABNORMAL HIGH (ref 4.0–10.5)

## 2015-04-16 LAB — COMPREHENSIVE METABOLIC PANEL
ALT: 56 U/L — ABNORMAL HIGH (ref 14–54)
ANION GAP: 12 (ref 5–15)
AST: 47 U/L — ABNORMAL HIGH (ref 15–41)
Albumin: 1.7 g/dL — ABNORMAL LOW (ref 3.5–5.0)
Alkaline Phosphatase: 126 U/L (ref 38–126)
BUN: 21 mg/dL — ABNORMAL HIGH (ref 6–20)
CHLORIDE: 101 mmol/L (ref 101–111)
CO2: 24 mmol/L (ref 22–32)
Calcium: 8.3 mg/dL — ABNORMAL LOW (ref 8.9–10.3)
Creatinine, Ser: 0.82 mg/dL (ref 0.44–1.00)
Glucose, Bld: 133 mg/dL — ABNORMAL HIGH (ref 65–99)
POTASSIUM: 4.4 mmol/L (ref 3.5–5.1)
Sodium: 137 mmol/L (ref 135–145)
Total Bilirubin: 2.6 mg/dL — ABNORMAL HIGH (ref 0.3–1.2)
Total Protein: 5.8 g/dL — ABNORMAL LOW (ref 6.5–8.1)

## 2015-04-16 LAB — WOUND CULTURE: CULTURE: NO GROWTH

## 2015-04-16 LAB — GLUCOSE, CAPILLARY
GLUCOSE-CAPILLARY: 102 mg/dL — AB (ref 65–99)
GLUCOSE-CAPILLARY: 134 mg/dL — AB (ref 65–99)
Glucose-Capillary: 109 mg/dL — ABNORMAL HIGH (ref 65–99)
Glucose-Capillary: 122 mg/dL — ABNORMAL HIGH (ref 65–99)
Glucose-Capillary: 168 mg/dL — ABNORMAL HIGH (ref 65–99)
Glucose-Capillary: 155 mg/dL — ABNORMAL HIGH (ref 65–99)

## 2015-04-16 LAB — PHOSPHORUS: PHOSPHORUS: 4.5 mg/dL (ref 2.5–4.6)

## 2015-04-16 LAB — MAGNESIUM: MAGNESIUM: 2 mg/dL (ref 1.7–2.4)

## 2015-04-16 LAB — TRIGLYCERIDES: TRIGLYCERIDES: 210 mg/dL — AB (ref <150)

## 2015-04-16 MED ORDER — ONDANSETRON HCL 4 MG/2ML IJ SOLN
4.0000 mg | Freq: Four times a day (QID) | INTRAMUSCULAR | Status: DC | PRN
  Administered 2015-04-16 – 2015-04-17 (×4): 4 mg via INTRAVENOUS
  Filled 2015-04-16 (×4): qty 2

## 2015-04-16 MED ORDER — DIPHENHYDRAMINE HCL 50 MG/ML IJ SOLN: 12.5000 mg | Freq: Four times a day (QID) | INTRAMUSCULAR | Status: DC | PRN

## 2015-04-16 MED ORDER — TRACE MINERALS CR-CU-MN-SE-ZN 10-1000-500-60 MCG/ML IV SOLN
INTRAVENOUS | Status: AC
  Administered 2015-04-16: 18:00:00 via INTRAVENOUS
  Filled 2015-04-16: qty 2636

## 2015-04-16 MED ORDER — DIPHENHYDRAMINE HCL 12.5 MG/5ML PO ELIX: 12.5000 mg | ORAL_SOLUTION | Freq: Four times a day (QID) | ORAL | Status: DC | PRN

## 2015-04-16 MED ORDER — SODIUM CHLORIDE 0.9% FLUSH: 9.0000 mL | INTRAVENOUS | Status: DC | PRN

## 2015-04-16 MED ORDER — FENTANYL 40 MCG/ML IV SOLN
INTRAVENOUS | Status: DC
  Administered 2015-04-16: 135 ug via INTRAVENOUS
  Administered 2015-04-16: 90 ug via INTRAVENOUS
  Administered 2015-04-16: 11:00:00 via INTRAVENOUS
  Administered 2015-04-17: 75 ug via INTRAVENOUS
  Administered 2015-04-17: 60 ug via INTRAVENOUS
  Administered 2015-04-18: 1 ug via INTRAVENOUS
  Filled 2015-04-16: qty 25

## 2015-04-16 MED ORDER — NALOXONE HCL 0.4 MG/ML IJ SOLN: 0.4000 mg | INTRAMUSCULAR | Status: DC | PRN

## 2015-04-16 NOTE — Progress Notes (Signed)
PULMONARY / CRITICAL CARE MEDICINE   Name: Kristina Estes MRN: [id number] DOB: 1970-04-30    ADMISSION DATE:  03/27/2015  CONSULTATION DATE: 03/27/15  REFERRING MD: Conchita Paris  CHIEF COMPLAINT: Hemorrhagic shock  SUBJECTIVE:  Tolerated pressure support.  VITAL SIGNS: BP 119/60 mmHg  Pulse 107  Temp(Src) 98.9 F (37.2 C) (Oral)  Resp 22  Ht  (1.6 m)  Wt 285 lb (129.275 kg)  BMI 50.50 kg/m2  SpO2 100%  LMP 03/27/2015  VENTILATOR SETTINGS: Vent Mode:  [-] PSV;CPAP FiO2 (%):  [40 %] 40 % Set Rate:  [20 bmp] 20 bmp Vt Set:  [450 mL] 450 mL PEEP:  [5 cmH20] 5 cmH20 Pressure Support:  [5 cmH20] 5 cmH20 Plateau Pressure:  [21 cmH20-24 cmH20] 23 cmH20  INTAKE / OUTPUT: I/O last 3 completed shifts: In: 7889.2 [I.V.:2195.4; NG/GT:90; IV Piggyback:1300] Out: 8795 [Urine:8205; Emesis/NG output:225; Drains:315; Blood:50]   PHYSICAL EXAMINATION: General: alert Neuro: follows commands HEENT: ETT in place Cardiac: regular, tachycardic Chest: no wheeze Abd: abdominal binder in place Ext: 1+ edema Skin: no rashes   LABS:  BMET  Recent Labs Lab 04/14/15 0400 04/14/15 1610 04/15/15 0413 04/16/15 0416  NA 136 136 137 137  K 4.5 4.5 4.6 4.4  CL 105  --  106 101  CO2 21*  --  24 24  BUN 33*  --  27* 21*  CREATININE 0.85  --  0.88 0.82  GLUCOSE 231*  --  159* 133*    Electrolytes  Recent Labs Lab 04/13/15 0456  04/14/15 0400 04/15/15 0413 04/16/15 0416  CALCIUM 7.9*  < > 7.8* 7.8* 8.3*  MG 2.0  --   --   --  2.0  PHOS 3.9  --   --   --  4.5  < > = values in this interval not displayed.  CBC  Recent Labs Lab 04/14/15 0400 04/14/15 1610 04/15/15 0413 04/16/15 0416  WBC 26.0*  --  20.4* 15.2*  HGB 6.2* 8.5* 7.9* 8.6*  HCT 18.8* 25.0* 23.2* 26.1*  PLT 331  --  318 343    Liver Enzymes  Recent Labs Lab 04/14/15 0400 04/15/15 0413 04/16/15 0416  AST 37 35 47*  ALT 63* 55* 56*  ALKPHOS 85 98 126  BILITOT 3.7* 2.7* 2.6*  ALBUMIN 1.4*  1.5* 1.7*   Glucose  Recent Labs Lab 04/15/15 1223 04/15/15 1522 04/15/15 1931 04/16/15 0002 04/16/15 0356 04/16/15 0805  GLUCAP 94 103* 110* 134* 109* 122*    Imaging Dg Chest Port 1 View  04/16/2015  CLINICAL DATA:  45 year old female with ventral abdominal abscess. Respiratory failure. Initial encounter. EXAM: PORTABLE CHEST 1 VIEW COMPARISON:  04/14/2015. CT Abdomen and Pelvis 04/13/2015, and earlier. FINDINGS: Portable AP semi upright view at 0617 hours. Stable endotracheal tube tip at the lower level of the clavicles. Enteric tube courses to the abdomen, tip not included. Stable cardiac size and mediastinal contours. Other external lines and tubes project about the chest, more so on the left. Lower lung volumes. No pneumothorax or pulmonary edema. No confluent pulmonary opacity or pleural effusion identified. IMPRESSION: 1.  Stable lines and tubes. 2. Low lung volumes, otherwise no acute cardiopulmonary abnormality. Electronically Signed   By: Odessa Fleming M.D.   On: 04/16/2015 07:54     STUDIES:  2/17 CT abd/pelvis >>Lt sided retroperitoneal hematoma 2/19 Doppler b/l legs >> DVT Lt common femoral vein 3/06 CT abdomen >> ventral fluid collection abscess/hematoma 3000 cm volume, Lt common femoral vein DVT  CULTURES:  2/23 Blood >> negative 2/24 Sputum >> Few candida 3/06 Abdominal wound >> negative  ANTIBIOTICS: 2/23 Vancomycin >> 2/26 2/23 Zosyn >> 3/02 3/06 Imipenem >> 3/09 3/06 Vancomycin >> 3/09   SIGNIFICANT EVENTS: 2/17 L5 laminectomy >> retroperitoneal hemorrhage; vascular surgery consulted >> Lt common iliac to Lt common femoral bypass 2/19 abdominal washout, wound vac; DVT Lt leg >> start heparin gtt 2/20 General surgery consulted 2/21 To OR 2/23 To OR 2/27 To OR 3/01 To OR >> abdominal wall closure with mesh 3/06 Hypotension, VDRF, anemia >> hematoma in abdominal wall >> to OR 3/07 IVC filter placed by VVS 3/08 Abdomen closed  LINES/TUBES: 2/17 IJ CVL >>  2/25 2/17 ETT >> 3/02 2/25 Rt PICC >>  3/06 ETT >> 3/09  DISCUSSION: 45 yo female with back and leg pain from L5-S1 spondylolisthesis.  She had L5 laminectomy on 2/17 >> c/b retroperitoneal hemorrhage.  She required laparotomy with Lt common iliac to common femoral bypass.  Her abdomen has remained open due to edema, and she has required repeated trips to the OR.  She remains on the vent for OR trips.  She has hx of Asthma, GERD.  ASSESSMENT / PLAN:  PULMONARY A: Acute respiratory failure in setting of retroperitoneal hemorrhage. P:  Proceed with extubation 3/09  CARDIOVASCULAR A: Hemorrhagic shock 2nd to retroperitoneal bleeding >> resolved. Lt leg DVT noted 2/19, Lt common femoral DVT on CT abd 3/06. P: S/p IVC filter 3/07  RENAL A:  Hypervolemia >> improved. P:  F/u renal fx Hold lasix 3/09  GASTROINTESTINAL A:  S/p laparotomy after retroperitoneal hemorrhage. Reexploration 3/06 for abdominal wall hematoma. Nutrition. P: Post op care, nutrition per surgery  HEMATOLOGIC A:  Anemia 2nd to acute blood loss, and critical illness. P:  F/u CBC Transfuse for Hb < 7 or bleeding  INFECTIOUS A:  HCAP >> completed ABx 3/02. Possible abdominal wall abscess >> cx negative. P:  D/c Abx 3/09  ENDOCRINE A:  Hyperglycemia. P:  SSI  NEUROLOGIC A:  L5-S1 spondylolisthesis. Post op pain control P:  Fentanyl PCA Will need spine surgery when more stable  Updated pt's Mother at bedside.  CC time 32 minutes.  Coralyn HellingVineet Loletta Harper, MD [facility] 04/16/2015, 9:58 AM Pager:  [phone] After 3pm call: [phone]

## 2015-04-16 NOTE — Progress Notes (Signed)
Patient ID: Kristina Estes, female   DOB: 04/04/1970, 45 y.o.   MRN: [id number] Late entry note... Saw the patient on rounds early this  Morning she seemed to be doing well moving  All extremities symmetrically and with good strength.  Patient still ventilated  Continue to follow

## 2015-04-16 NOTE — Progress Notes (Addendum)
[facility] Progress Note  Subjective   Extubated this am. Wants to get out of bed. Is anxious about developing a DVT.   Objective Filed Vitals:   04/16/15 1114 04/16/15 1130  BP:    Pulse:    Temp:  98.4 F (36.9 C)  Resp: 29     Intake/Output Summary (Last 24 hours) at 04/16/15 1156 Last data filed at 04/16/15 1030  Gross per 24 hour  Intake 3441.21 ml  Output   5840 ml  Net -2398.79 ml   Alert in NAD Abdominal binder in place.  Right neck incision clean. No hematoma Left groin with separation. Clean, no drainage.  Palpable DP pulses bilaterally    Assessment/Planning: 45 y.o. female is s/p: X-lap, Repair L CIV, Ligation distal L CIA, L CIA to CFA bypass, s/p multiple abd vac changes x 4 (03/27/15) S/p Abd closure with mesh (04/08/15), S/p Evac of abd hematoma (04/13/15) S/p IVC filter placement (04/14/15) S/p Closure of abd (04/15/15)  1 Day Post-Op   Extubated this am. Feet well perfused. Left groin with separation. Wound appears clean. Wet to dry dressing bid.  OOB if ok with neurosurgery.   Raymond GurneyKimberly A Trinh 04/16/2015 11:56 AM --  Laboratory CBC    Component Value Date/Time   WBC 15.2* 04/16/2015 0416   HGB 8.6* 04/16/2015 0416   HCT 26.1* 04/16/2015 0416   PLT 343 04/16/2015 0416    BMET    Component Value Date/Time   NA 137 04/16/2015 0416   K 4.4 04/16/2015 0416   CL 101 04/16/2015 0416   CO2 24 04/16/2015 0416   GLUCOSE 133* 04/16/2015 0416   BUN 21* 04/16/2015 0416   CREATININE 0.82 04/16/2015 0416   CALCIUM 8.3* 04/16/2015 0416   GFRNONAA >60 04/16/2015 0416   GFRAA >60 04/16/2015 0416    COAG Lab Results  Component Value Date   INR 1.30 04/15/2015   INR 1.34 04/13/2015   INR 1.20 04/02/2015   No results found for: PTT  Antibiotics Anti-infectives    Start     Dose/Rate Route Frequency Ordered Stop   04/15/15 0930  polymyxin B 500,000 Units, bacitracin 50,000 Units in sodium chloride irrigation 0.9 % 500 mL  irrigation  Status:  Discontinued       As needed 04/15/15 0930 04/15/15 0959   04/13/15 2230  vancomycin (VANCOCIN) IVPB 1000 mg/200 mL premix  Status:  Discontinued     1,000 mg 200 mL/hr over 60 Minutes Intravenous Every 12 hours 04/13/15 0948 04/16/15 1007   04/13/15 1200  imipenem-cilastatin (PRIMAXIN) 500 mg in sodium chloride 0.9 % 100 mL IVPB  Status:  Discontinued     500 mg 200 mL/hr over 30 Minutes Intravenous 4 times per day 04/13/15 0946 04/16/15 1007   04/13/15 1000  ceFAZolin (ANCEF) IVPB 2 g/50 mL premix  Status:  Discontinued     2 g 100 mL/hr over 30 Minutes Intravenous 3 times per day 04/13/15 0901 04/13/15 0936   04/13/15 1000  vancomycin (VANCOCIN) 2,500 mg in sodium chloride 0.9 % 500 mL IVPB     2,500 mg 250 mL/hr over 120 Minutes Intravenous  Once 04/13/15 0948 04/13/15 1405   04/02/15 2330  vancomycin (VANCOCIN) IVPB 1000 mg/200 mL premix  Status:  Discontinued     1,000 mg 200 mL/hr over 60 Minutes Intravenous Every 12 hours 04/02/15 1015 04/05/15 0849   04/02/15 1830  piperacillin-tazobactam (ZOSYN) IVPB 3.375 g  Status:  Discontinued     3.375  g 12.5 mL/hr over 240 Minutes Intravenous 3 times per day 04/02/15 1015 04/09/15 0804   04/02/15 1100  cefUROXime (ZINACEF) 1.5 g in dextrose 5 % 50 mL IVPB  Status:  Discontinued     1.5 g 100 mL/hr over 30 Minutes Intravenous To ShortStay Surgical 04/01/15 1106 04/02/15 1448   04/02/15 1100  vancomycin (VANCOCIN) 2,500 mg in sodium chloride 0.9 % 500 mL IVPB     2,500 mg 250 mL/hr over 120 Minutes Intravenous  Once 04/02/15 1029 04/02/15 1342   04/02/15 1030  vancomycin (VANCOCIN) 2,250 mg in sodium chloride 0.9 % 500 mL IVPB  Status:  Discontinued     2,250 mg 250 mL/hr over 120 Minutes Intravenous  Once 04/02/15 1015 04/02/15 1029   04/02/15 1030  piperacillin-tazobactam (ZOSYN) IVPB 3.375 g     3.375 g 100 mL/hr over 30 Minutes Intravenous  Once 04/02/15 1015 04/02/15 1212   03/27/15 2130  cefUROXime (ZINACEF)  1.5 g in dextrose 5 % 50 mL IVPB  Status:  Discontinued     1.5 g 100 mL/hr over 30 Minutes Intravenous Every 12 hours 03/27/15 2124 03/27/15 2132   03/27/15 1600  ceFAZolin (ANCEF) IVPB 2 g/50 mL premix     2 g 100 mL/hr over 30 Minutes Intravenous Every 8 hours 03/27/15 1322 03/28/15 0759   03/27/15 1447  ceFAZolin (ANCEF) 1-5 GM-% IVPB    Comments:  Merril Abbe   : cabinet override      03/27/15 1447 03/28/15 0259   03/27/15 0800  bacitracin 50,000 Units in sodium chloride irrigation 0.9 % 500 mL irrigation  Status:  Discontinued       As needed 03/27/15 0943 03/27/15 1223   03/27/15 0700  ceFAZolin (ANCEF) 3 g in dextrose 5 % 50 mL IVPB     3 g 130 mL/hr over 30 Minutes Intravenous To ShortStay Surgical 03/26/15 1409 03/27/15 0750       Maris Berger, PA-C [facility] Office: [phone] Pager: [phone] 04/16/2015 11:56 AM   Addendum  I have independently interviewed and examined the patient, and I agree with the physician assistant's findings.  Pt already has DVT.  Once General Surgery feels its safe to resume anticoagulation, I would resume anticoagulation, as the IVC filter is thrombogenic and will need to be removed eventually.  Will continue check on the patient periodically.  Leonides Sake, MD [facility] [place] Office: [phone] Pager: [phone]  04/16/2015, 2:22 PM

## 2015-04-16 NOTE — Progress Notes (Signed)
PARENTERAL NUTRITION CONSULT NOTE - FOLLOW UP  Pharmacy Consult:  TPN Indication:  Open abdomen s/p closure  Allergies  Allergen Reactions  . Propoxyphene Other (See Comments)    Unspecified    Patient Measurements: Height: 5\' 3"  (160 cm) Weight: 285 lb (129.275 kg) IBW/kg (Calculated) : 52.4  BMI = 59.7  Vital Signs: Temp: 98.9 F (37.2 C) (03/09 0400) Temp Source: Oral (03/09 0400) BP: 105/60 mmHg (03/09 0700) Pulse Rate: 103 (03/09 0700) Intake/Output from previous day: 03/08 0701 - 03/09 0700 In: 4477 [I.V.:1619.2; NG/GT:90; IV Piggyback:900; TPN:1867.8] Out: 6070 [Urine:5805; Emesis/NG output:25; Drains:190; Blood:50]  Labs:  Recent Labs  04/13/15 0910  04/14/15 0400 04/14/15 1610 04/15/15 0413 04/16/15 0416  WBC 39.9*  < > 26.0*  --  20.4* 15.2*  HGB 6.7*  < > 6.2* 8.5* 7.9* 8.6*  HCT 20.8*  < > 18.8* 25.0* 23.2* 26.1*  PLT 495*  < > 331  --  318 343  APTT 43*  --   --   --   --   --   INR 1.34  --   --   --  1.30  --   < > = values in this interval not displayed.   Recent Labs  04/13/15 1600  04/14/15 0400 04/14/15 1610 04/15/15 0413 04/16/15 0416  NA 138  < > 136 136 137 137  K 5.6*  < > 4.5 4.5 4.6 4.4  CL 106  < > 105  --  106 101  CO2 22  < > 21*  --  24 24  GLUCOSE 168*  < > 231*  --  159* 133*  BUN 34*  < > 33*  --  27* 21*  CREATININE 1.07*  < > 0.85  --  0.88 0.82  CALCIUM 8.2*  < > 7.8*  --  7.8* 8.3*  MG  --   --   --   --   --  2.0  PHOS  --   --   --   --   --  4.5  PROT  --   --  4.6*  --  5.2* 5.8*  ALBUMIN  --   --  1.4*  --  1.5* 1.7*  AST  --   --  37  --  35 47*  ALT  --   --  63*  --  55* 56*  ALKPHOS  --   --  85  --  98 126  BILITOT  --   --  3.7*  --  2.7* 2.6*  TRIG 318*  --   --   --   --  210*  < > = values in this interval not displayed. Estimated Creatinine Clearance: 113.8 mL/min (by C-G formula based on Cr of 0.82).    Recent Labs  04/15/15 1931 04/16/15 0002 04/16/15 0356  GLUCAP 110* 134* 109*      Insulin Requirements in the past 24 hours:  8 units SSI + 7 units of regular insulin in TPN  Assessment: Kristina Estes admitted on 03/27/15 with back and leg pain and underwent L5 laminectomy with facetectomy later that day.  Intraoperatively patient went into shock and CT revealed large left RP hematoma.  Patient returned to the OR for ex-lap with repair of left iliac vein, ligation of distal left CIA, left CIA-CFA bypass, and placement of abdominal VAC.  Pharmacy consulted to manage TPN for nutritional support.  GI: hx GERD / obesity.  Baseline prealbumin low at 8.4 >>  14.1 (frequent OR visits for abd washout) - s/p abdominal wall closure with mesh on 3/1.  Found to have a large abd wall hematoma 3/6, s/p evacuation 3/8.  Emesis/NG O/P 25mL, drains .  PPI IV, Reglan   Endo: no hx DM - hypoglycemic prior to TPN initiation.  CBGs low normal Lytes: all WNL except slightly low iCa on 3/8 (Ca gluconate 2gm given 3/8) Renal: SCr stable, CrCL > 100 ml/min - good UOP 1.9 ml/kg/hr, NS at 10 ml/hr Pulm: extubated 3/2, reintubated 3/6, extubated 3/9 - FiO2 40% Cards: angina - BP soft, sinus tach - Lasix IV BID AC: holding heparin for new left femoral DVT (dx 03/29/15) d/t bleeding, s/p IVC filter placement 3/7 - hgb 8.6, plts WNL, INR 1.3 Hepatobil: AST/ALT mildly elevated, tbili improved to 2.6 (RN noted a little jaundice in sclera on 3/3, resolved now). TG improved to 210 Neuro: off sedation ID: s/p Vanc/Primaxin - afebrile, WBC improved to 15.2 Best Practices: holding heparin gtt, MC TPN Access: right IJ placed 2/17>>2/25, triple lumen PICC 2/25>> TPN start date: 2/21 >>  Current Nutrition:  Clinimix  Off Propofol at 23.3 ml/hr (started 3/6) = 615 kCal  Nutritional Goals: 2000-2200 kCal and 130-140 gm protein per day   Plan:  - Resume cyclic TPN as patient is extubated and off sedation.  Clinimix E 5/15, cycle 2636 mLs over 14 hrs:  145ml/hr x 1 hr, then 203 ml/hr x 12 hrs, and 100 ml/hr x  1 hr - Lipids 20% on Tues and Fri to minimize kCal provision - TPN will provide a weekly average of 2007 kCal per day and 132gm protein per day, meeting 100% of patient's needs - Continue multivitamin and trace elements in TPN - Increase insulin in TPN to 15 units (previously 25 units while on cyclic TPN) and continue moderate SSI Q4H - F/U daily   Dewitt Judice D. Laney Potash, PharmD, BCPS Pager:  [phone] 04/16/2015, 10:14 AM

## 2015-04-16 NOTE — Procedures (Signed)
Extubation Procedure Note  Patient Details:   Name: Kristina Estes DOB: 10/28/1970 MRN: [id number]   Airway Documentation: Pt had audible cuff leak prior to extubation, following commands and alert.  Extubated to 4lpm [place] sat 100%, HR 107, RR 22.  Stated name cough is strong and productive.  Will continue to monitor.    Evaluation  O2 sats: stable throughout Complications: No apparent complications Patient did tolerate procedure well. Bilateral Breath Sounds: Clear, Diminished Suctioning: Airway Yes  Richmond CampbellHall, Brizeyda Holtmeyer Lynn 04/16/2015, 10:03 AM

## 2015-04-16 NOTE — Progress Notes (Signed)
Patient ID: Kristina Estes Kristina Estes, female   DOB: 04/02/1970, 45 y.o.   MRN: [id number] With regard to the patient's ability to get out of bed, I do not feel comfortable mobilizing this patient out of bed in light of complete facetectomies performed at L5 with an aborted fusion at this point.  I think it's okay to anticoagulate per the general and vascular surgery team and the head of the bed can be elevate but I do not feel comfortable allowing the patient to get out of  Until after Dr. Conchita ParisNundkumar returns and disposition is determined with finishing her fusion.

## 2015-04-16 NOTE — Progress Notes (Signed)
eLink Physician-Brief Progress Note Patient Name: Kristina Estes DOB: 11/22/1970 MRN: [id number]   Date of Service  04/16/2015  HPI/Events of Note  Can recheck on patient post extubation today. Resting comfortably laying recumbent in bed. Mother bedside. Hemodynamically appears to be relatively stable. No increased work of breathing.  eICU Interventions  Continue close monitoring in the intensive care area for potential reintubation.     Intervention Category Major Interventions: Respiratory failure - evaluation and management  Kristina Estes 04/16/2015, 9:44 PM

## 2015-04-16 NOTE — Progress Notes (Signed)
Patient ID: Kristina Estes, female   DOB: January 23, 1971, 45 y.o.   MRN: [id number]     [address] [place], [place] [address]    Phone: [phone] FAX: [phone]     Subjective: Pt asleep. Awakens to command.  VSS.  H&H are stable. Mild tachycardia.  ruq drain 25m/24h luq drain 723m24h both are sanguinous.   Objective:  Vital signs:  Filed Vitals:   04/16/15 0400 04/16/15 0500 04/16/15 0600 04/16/15 0700  BP: 107/70  117/60 105/60  Pulse: 110 123 106 103  Temp: 98.9 F (37.2 C)     TempSrc: Oral     Resp: _0 Height:      Weight:      SpO2: 100% 99% 100% 100%    Last BM Date: 04/12/15  Intake/Output   Yesterday:  03/08 0701 - 03/09 0700 In: 445374I.V.:1619.2; NG/GT:90; IV Piggyback:900; TPMOL:0786.7]ut: 605449Urine:5805; Emesis/NG output:25; Drains:190; Blood:50] This shift:    I/O last 3 completed shifts: In: 7889.2 [I.V.:2195.4; NG/GT:90; IV Piggyback:1300] Out: 872010Urine:8205; Emesis/NG output:225; Drains:315; Blood:50]    Physical Exam: General: Pt awake/alert.  On vent.   Abdomen: no bowel sounds.  Abdomen is soft.  Non tender. Both drain with sanguinous output.  Dressing is dry and intact.      Problem List:   Active Problems:   Lumbar spondylolysis   Acute respiratory failure with hypoxia (HCC)   Hemorrhagic shock   Hypervolemia   Acute respiratory failure with hypercapnia (HCC)   Fever, unspecified   Pressure ulcer   Hematoma of abdominal wall   Acute respiratory failure (HCC)   Atelectasis   Encounter for imaging study to confirm nasogastric (NG) tube placement   Abdominal swelling   Spinal stenosis    Results:   Labs: Results for orders placed or performed during the hospital encounter of 03/27/15 (from the past 48 hour(s))  Glucose, capillary     Status: Abnormal   Collection Time: 04/14/15  8:07 AM  Result Value Ref Range   Glucose-Capillary 166 (H)  65 - 99 mg/dL   Comment 1 Capillary Specimen    Comment 2 Notify RN   Prepare RBC     Status: None   Collection Time: 04/14/15  8:26 AM  Result Value Ref Range   Order Confirmation ORDER PROCESSED BY BLOOD BANK   Glucose, capillary     Status: None   Collection Time: 04/14/15 11:41 AM  Result Value Ref Range   Glucose-Capillary 84 65 - 99 mg/dL   Comment 1 Capillary Specimen    Comment 2 Notify RN   I-STAT 7, (LYTES, BLD GAS, ICA, H+H)     Status: Abnormal   Collection Time: 04/14/15  4:10 PM  Result Value Ref Range   pH, Arterial 7.380 7.350 - 7.450   pCO2 arterial 43.4 35.0 - 45.0 mmHg   pO2, Arterial 541.0 (H) 80.0 - 100.0 mmHg   Bicarbonate 25.8 (H) 20.0 - 24.0 mEq/L   TCO2 27 0 - 100 mmol/L   O2 Saturation 100.0 %   Sodium 136 135 - 145 mmol/L   Potassium 4.5 3.5 - 5.1 mmol/L   Calcium, Ion 1.04 (L) 1.12 - 1.23 mmol/L   HCT 25.0 (L) 36.0 - 46.0 %   Hemoglobin 8.5 (L) 12.0 - 15.0 g/dL   Patient temperature 36.4 C    Sample type ARTERIAL   Glucose, capillary  Status: None   Collection Time: 04/14/15  6:12 PM  Result Value Ref Range   Glucose-Capillary 68 65 - 99 mg/dL  Glucose, capillary     Status: None   Collection Time: 04/14/15  6:45 PM  Result Value Ref Range   Glucose-Capillary 93 65 - 99 mg/dL  Glucose, capillary     Status: None   Collection Time: 04/14/15  7:26 PM  Result Value Ref Range   Glucose-Capillary 82 65 - 99 mg/dL   Comment 1 Capillary Specimen    Comment 2 Notify RN    Comment 3 Document in Chart   Glucose, capillary     Status: Abnormal   Collection Time: 04/14/15 11:54 PM  Result Value Ref Range   Glucose-Capillary 154 (H) 65 - 99 mg/dL   Comment 1 Capillary Specimen    Comment 2 Notify RN    Comment 3 Document in Chart   Glucose, capillary     Status: Abnormal   Collection Time: 04/15/15  3:53 AM  Result Value Ref Range   Glucose-Capillary 169 (H) 65 - 99 mg/dL   Comment 1 Capillary Specimen    Comment 2 Notify RN    Comment 3  Document in Chart   CBC     Status: Abnormal   Collection Time: 04/15/15  4:13 AM  Result Value Ref Range   WBC 20.4 (H) 4.0 - 10.5 K/uL   RBC 2.59 (L) 3.87 - 5.11 MIL/uL   Hemoglobin 7.9 (L) 12.0 - 15.0 g/dL    Comment: DELTA CHECK NOTED REPEATED TO VERIFY POST TRANSFUSION SPECIMEN    HCT 23.2 (L) 36.0 - 46.0 %   MCV 89.6 78.0 - 100.0 fL   MCH 30.5 26.0 - 34.0 pg   MCHC 34.1 30.0 - 36.0 g/dL   RDW 16.7 (H) 11.5 - 15.5 %   Platelets 318 150 - 400 K/uL  Comprehensive metabolic panel     Status: Abnormal   Collection Time: 04/15/15  4:13 AM  Result Value Ref Range   Sodium 137 135 - 145 mmol/L   Potassium 4.6 3.5 - 5.1 mmol/L   Chloride 106 101 - 111 mmol/L   CO2 24 22 - 32 mmol/L   Glucose, Bld 159 (H) 65 - 99 mg/dL   BUN 27 (H) 6 - 20 mg/dL   Creatinine, Ser 0.88 0.44 - 1.00 mg/dL   Calcium 7.8 (L) 8.9 - 10.3 mg/dL   Total Protein 5.2 (L) 6.5 - 8.1 g/dL   Albumin 1.5 (L) 3.5 - 5.0 g/dL   AST 35 15 - 41 U/L   ALT 55 (H) 14 - 54 U/L   Alkaline Phosphatase 98 38 - 126 U/L   Total Bilirubin 2.7 (H) 0.3 - 1.2 mg/dL   GFR calc non Af Amer >60 >60 mL/min   GFR calc Af Amer >60 >60 mL/min    Comment: (NOTE) The eGFR has been calculated using the CKD EPI equation. This calculation has not been validated in all clinical situations. eGFR's persistently <60 mL/min signify possible Chronic Kidney Disease.    Anion gap 7 5 - 15  Protime-INR     Status: Abnormal   Collection Time: 04/15/15  4:13 AM  Result Value Ref Range   Prothrombin Time 16.3 (H) 11.6 - 15.2 seconds   INR 1.30 0.00 - 1.49  Glucose, capillary     Status: None   Collection Time: 04/15/15 12:23 PM  Result Value Ref Range   Glucose-Capillary 94 65 - 99 mg/dL  Comment 1 Notify RN   Glucose, capillary     Status: Abnormal   Collection Time: 04/15/15  3:22 PM  Result Value Ref Range   Glucose-Capillary 103 (H) 65 - 99 mg/dL   Comment 1 Capillary Specimen   Glucose, capillary     Status: Abnormal   Collection  Time: 04/15/15  7:31 PM  Result Value Ref Range   Glucose-Capillary 110 (H) 65 - 99 mg/dL   Comment 1 Capillary Specimen    Comment 2 Notify RN    Comment 3 Document in Chart   Glucose, capillary     Status: Abnormal   Collection Time: 04/16/15 12:02 AM  Result Value Ref Range   Glucose-Capillary 134 (H) 65 - 99 mg/dL   Comment 1 Capillary Specimen    Comment 2 Notify RN    Comment 3 Document in Chart   Glucose, capillary     Status: Abnormal   Collection Time: 04/16/15  3:56 AM  Result Value Ref Range   Glucose-Capillary 109 (H) 65 - 99 mg/dL   Comment 1 Capillary Specimen    Comment 2 Notify RN    Comment 3 Document in Chart   Comprehensive metabolic panel     Status: Abnormal   Collection Time: 04/16/15  4:16 AM  Result Value Ref Range   Sodium 137 135 - 145 mmol/L   Potassium 4.4 3.5 - 5.1 mmol/L   Chloride 101 101 - 111 mmol/L   CO2 24 22 - 32 mmol/L   Glucose, Bld 133 (H) 65 - 99 mg/dL   BUN 21 (H) 6 - 20 mg/dL   Creatinine, Ser 0.82 0.44 - 1.00 mg/dL   Calcium 8.3 (L) 8.9 - 10.3 mg/dL   Total Protein 5.8 (L) 6.5 - 8.1 g/dL   Albumin 1.7 (L) 3.5 - 5.0 g/dL   AST 47 (H) 15 - 41 U/L   ALT 56 (H) 14 - 54 U/L   Alkaline Phosphatase 126 38 - 126 U/L   Total Bilirubin 2.6 (H) 0.3 - 1.2 mg/dL   GFR calc non Af Amer >60 >60 mL/min   GFR calc Af Amer >60 >60 mL/min    Comment: (NOTE) The eGFR has been calculated using the CKD EPI equation. This calculation has not been validated in all clinical situations. eGFR's persistently <60 mL/min signify possible Chronic Kidney Disease.    Anion gap 12 5 - 15  Magnesium     Status: None   Collection Time: 04/16/15  4:16 AM  Result Value Ref Range   Magnesium 2.0 1.7 - 2.4 mg/dL  Phosphorus     Status: None   Collection Time: 04/16/15  4:16 AM  Result Value Ref Range   Phosphorus 4.5 2.5 - 4.6 mg/dL  CBC     Status: Abnormal   Collection Time: 04/16/15  4:16 AM  Result Value Ref Range   WBC 15.2 (H) 4.0 - 10.5 K/uL   RBC  2.83 (L) 3.87 - 5.11 MIL/uL   Hemoglobin 8.6 (L) 12.0 - 15.0 g/dL   HCT 26.1 (L) 36.0 - 46.0 %   MCV 92.2 78.0 - 100.0 fL   MCH 30.4 26.0 - 34.0 pg   MCHC 33.0 30.0 - 36.0 g/dL   RDW 17.7 (H) 11.5 - 15.5 %   Platelets 343 150 - 400 K/uL  Triglycerides     Status: Abnormal   Collection Time: 04/16/15  4:16 AM  Result Value Ref Range   Triglycerides 210 (H) <150 mg/dL  Imaging / Studies: No results found.  Medications / Allergies:  Scheduled Meds: . antiseptic oral rinse  7 mL Mouth Rinse QID  . chlorhexidine gluconate  15 mL Mouth Rinse BID  . imipenem-cilastatin  500 mg Intravenous 4 times per day  . insulin aspart  0-20 Units Subcutaneous 6 times per day  . metoCLOPramide (REGLAN) injection  10 mg Intravenous 4 times per day  . pantoprazole (PROTONIX) IV  40 mg Intravenous Q24H  . sodium chloride flush  10-40 mL Intracatheter Q12H  . vancomycin  1,000 mg Intravenous Q12H   Continuous Infusions: . Marland KitchenTPN (CLINIMIX-E) Adult 80 mL/hr at 04/16/15 0700  . sodium chloride 10 mL/hr at 04/13/15 0600  . sodium chloride Stopped (04/14/15 1805)  . fentaNYL infusion INTRAVENOUS 200 mcg/hr (04/16/15 0700)  . lactated ringers 10 mL/hr at 04/13/15 1234  . propofol (DIPRIVAN) infusion 30 mcg/kg/min (04/16/15 0700)   PRN Meds:.morphine injection, sodium chloride flush  Antibiotics: Anti-infectives    Start     Dose/Rate Route Frequency Ordered Stop   04/15/15 0930  polymyxin B 500,000 Units, bacitracin 50,000 Units in sodium chloride irrigation 0.9 % 500 mL irrigation  Status:  Discontinued       As needed 04/15/15 0930 04/15/15 0959   04/13/15 2230  vancomycin (VANCOCIN) IVPB 1000 mg/200 mL premix     1,000 mg 200 mL/hr over 60 Minutes Intravenous Every 12 hours 04/13/15 0948     04/13/15 1200  imipenem-cilastatin (PRIMAXIN) 500 mg in sodium chloride 0.9 % 100 mL IVPB     500 mg 200 mL/hr over 30 Minutes Intravenous 4 times per day 04/13/15 0946     04/13/15 1000  ceFAZolin (ANCEF)  IVPB 2 g/50 mL premix  Status:  Discontinued     2 g 100 mL/hr over 30 Minutes Intravenous 3 times per day 04/13/15 0901 04/13/15 0936   04/13/15 1000  vancomycin (VANCOCIN) 2,500 mg in sodium chloride 0.9 % 500 mL IVPB     2,500 mg 250 mL/hr over 120 Minutes Intravenous  Once 04/13/15 0948 04/13/15 1405   04/02/15 2330  vancomycin (VANCOCIN) IVPB 1000 mg/200 mL premix  Status:  Discontinued     1,000 mg 200 mL/hr over 60 Minutes Intravenous Every 12 hours 04/02/15 1015 04/05/15 0849   04/02/15 1830  piperacillin-tazobactam (ZOSYN) IVPB 3.375 g  Status:  Discontinued     3.375 g 12.5 mL/hr over 240 Minutes Intravenous 3 times per day 04/02/15 1015 04/09/15 0804   04/02/15 1100  cefUROXime (ZINACEF) 1.5 g in dextrose 5 % 50 mL IVPB  Status:  Discontinued     1.5 g 100 mL/hr over 30 Minutes Intravenous To ShortStay Surgical 04/01/15 1106 04/02/15 1448   04/02/15 1100  vancomycin (VANCOCIN) 2,500 mg in sodium chloride 0.9 % 500 mL IVPB     2,500 mg 250 mL/hr over 120 Minutes Intravenous  Once 04/02/15 1029 04/02/15 1342   04/02/15 1030  vancomycin (VANCOCIN) 2,250 mg in sodium chloride 0.9 % 500 mL IVPB  Status:  Discontinued     2,250 mg 250 mL/hr over 120 Minutes Intravenous  Once 04/02/15 1015 04/02/15 1029   04/02/15 1030  piperacillin-tazobactam (ZOSYN) IVPB 3.375 g     3.375 g 100 mL/hr over 30 Minutes Intravenous  Once 04/02/15 1015 04/02/15 1212   03/27/15 2130  cefUROXime (ZINACEF) 1.5 g in dextrose 5 % 50 mL IVPB  Status:  Discontinued     1.5 g 100 mL/hr over 30 Minutes Intravenous Every 12 hours 03/27/15  2124 03/27/15 2132   03/27/15 1600  ceFAZolin (ANCEF) IVPB 2 g/50 mL premix     2 g 100 mL/hr over 30 Minutes Intravenous Every 8 hours 03/27/15 1322 03/28/15 0759   03/27/15 1447  ceFAZolin (ANCEF) 1-5 GM-% IVPB    Comments:  Claybon Jabs   : cabinet override      03/27/15 1447 03/28/15 0259   03/27/15 0800  bacitracin 50,000 Units in sodium chloride irrigation 0.9 % 500  mL irrigation  Status:  Discontinued       As needed 03/27/15 0943 03/27/15 1223   03/27/15 0700  ceFAZolin (ANCEF) 3 g in dextrose 5 % 50 mL IVPB     3 g 130 mL/hr over 30 Minutes Intravenous To ShortStay Surgical 03/26/15 1409 03/27/15 0750       Assessment/Plan: S/p L5 laminectomy (2/17/7 - Dr. Kathyrn Sheriff)  Left iliac vein and left common iliac artery bleeding  S/p Ex lap, repair of left iliac vein, ligation of distal left common iliac artery, left common iliac artery to left common femoral bypass with 45m Dacron, placement of abdominal negative pressure dressing (03/27/15 - Early and CBridgett Larsson  Subsequently 4 abdominal washouts and wound vac changes 2/19, 2/21, 2/23, 2/27 (Bridgett Larsson POD#7 s/p exploratory laparotomy, abdominal wound closure with Phasix mesh, drain placement(Dr. RRosendo Gros  Post-operative abdominal wall hematoma POD #3 s/p Exploration of abdominal wound, evacuation of hematoma, placement of negative pressure dressing (Ingram)(04/13/2015) POD#1 wound exploration, evacuation of residual hematoma, closure of sking and subq tissue(Dr. IDalbert Batman -okay to extubate from a surgical standpoint -continue NGT, bowel rest -leave dressing on today -continue with drains ABLA-stable  Leukocytosis- trending down.  On Vanc and primaxin  PCM-TPN for now Left common iliac DVT-s/p IVC filter  Disp - continue ICU   EErby Pian ANP-BC [facility] Pager [phone](7A-4:30P) For consults and floor pages call [phone](7A-4:30P)  04/16/2015 7:50 AM

## 2015-04-17 ENCOUNTER — Inpatient Hospital Stay (HOSPITAL_COMMUNITY): Payer: PRIVATE HEALTH INSURANCE

## 2015-04-17 LAB — GLUCOSE, CAPILLARY
GLUCOSE-CAPILLARY: 163 mg/dL — AB (ref 65–99)
GLUCOSE-CAPILLARY: 90 mg/dL (ref 65–99)
Glucose-Capillary: 121 mg/dL — ABNORMAL HIGH (ref 65–99)
Glucose-Capillary: 162 mg/dL — ABNORMAL HIGH (ref 65–99)
Glucose-Capillary: 176 mg/dL — ABNORMAL HIGH (ref 65–99)
Glucose-Capillary: 80 mg/dL (ref 65–99)

## 2015-04-17 LAB — BASIC METABOLIC PANEL
ANION GAP: 8 (ref 5–15)
BUN: 21 mg/dL — ABNORMAL HIGH (ref 6–20)
CALCIUM: 8.2 mg/dL — AB (ref 8.9–10.3)
CO2: 26 mmol/L (ref 22–32)
Chloride: 102 mmol/L (ref 101–111)
Creatinine, Ser: 0.63 mg/dL (ref 0.44–1.00)
GFR calc Af Amer: >60 mL/min (ref >60)
GFR calc non Af Amer: >60 mL/min (ref >60)
GLUCOSE: 181 mg/dL — AB (ref 65–99)
POTASSIUM: 4.2 mmol/L (ref 3.5–5.1)
Sodium: 136 mmol/L (ref 135–145)

## 2015-04-17 LAB — CBC
HEMATOCRIT: 25.7 % — AB (ref 36.0–46.0)
Hemoglobin: 8 g/dL — ABNORMAL LOW (ref 12.0–15.0)
MCH: 28.9 pg (ref 26.0–34.0)
MCHC: 31.1 g/dL (ref 30.0–36.0)
MCV: 92.8 fL (ref 78.0–100.0)
Platelets: 348 10*3/uL (ref 150–400)
RBC: 2.77 MIL/uL — ABNORMAL LOW (ref 3.87–5.11)
RDW: 17.1 % — AB (ref 11.5–15.5)
WBC: 10.1 10*3/uL (ref 4.0–10.5)

## 2015-04-17 MED ORDER — FAT EMULSION 20 % IV EMUL
250.0000 mL | INTRAVENOUS | Status: DC
  Administered 2015-04-17: 250 mL via INTRAVENOUS
  Filled 2015-04-17 (×2): qty 250

## 2015-04-17 MED ORDER — CETYLPYRIDINIUM CHLORIDE 0.05 % MT LIQD
7.0000 mL | Freq: Two times a day (BID) | OROMUCOSAL | Status: DC
  Administered 2015-04-17 – 2015-04-22 (×10): 7 mL via OROMUCOSAL

## 2015-04-17 MED ORDER — CHLORHEXIDINE GLUCONATE 0.12 % MT SOLN
15.0000 mL | Freq: Two times a day (BID) | OROMUCOSAL | Status: DC
  Administered 2015-04-18 – 2015-04-22 (×8): 15 mL via OROMUCOSAL
  Filled 2015-04-17 (×8): qty 15

## 2015-04-17 MED ORDER — TRACE MINERALS CR-CU-MN-SE-ZN 10-1000-500-60 MCG/ML IV SOLN
INTRAVENOUS | Status: DC
  Administered 2015-04-17: 18:00:00 via INTRAVENOUS
  Filled 2015-04-17: qty 2636

## 2015-04-17 NOTE — Progress Notes (Signed)
PARENTERAL NUTRITION CONSULT NOTE - FOLLOW UP  Pharmacy Consult for TPN Indication: Open abdomen s/p closure  Allergies  Allergen Reactions  . Propoxyphene Other (See Comments)    Unspecified    Patient Measurements: Height: 5\' 3"  (160 cm) Weight: 285 lb (129.275 kg) IBW/kg (Calculated) : 52.4 Adjusted Body Weight:  Usual Weight:   Vital Signs: Temp: 98.3 F (36.8 C) (03/10 0406) Temp Source: Oral (03/10 0406) BP: 109/70 mmHg (03/10 0700) Pulse Rate: 96 (03/10 0700) Intake/Output from previous day: 03/09 0701 - 03/10 0700 In: 3753.9 [I.V.:67.9; NG/GT:90; IV Piggyback:200; TPN:3396] Out: 2790 [Urine:2085; Emesis/NG output:520; Drains:185] Intake/Output from this shift:    Labs:  Recent Labs  04/15/15 0413 04/16/15 0416 04/17/15 0449  WBC 20.4* 15.2* 10.1  HGB 7.9* 8.6* 8.0*  HCT 23.2* 26.1* 25.7*  PLT 318 343 348  INR 1.30  --   --      Recent Labs  04/15/15 0413 04/16/15 0416 04/17/15 0449  NA 137 137 136  K 4.6 4.4 4.2  CL 106 101 102  CO2 24 24 26   GLUCOSE 159* 133* 181*  BUN 27* 21* 21*  CREATININE 0.88 0.82 0.63  CALCIUM 7.8* 8.3* 8.2*  MG  --  2.0  --   PHOS  --  4.5  --   PROT 5.2* 5.8*  --   ALBUMIN 1.5* 1.7*  --   AST 35 47*  --   ALT 55* 56*  --   ALKPHOS 98 126  --   BILITOT 2.7* 2.6*  --   TRIG  --  210*  --    Estimated Creatinine Clearance: 116.6 mL/min (by C-G formula based on Cr of 0.63).    Recent Labs  04/16/15 1934 04/16/15 2333 04/17/15 0345  GLUCAP 155* 162* 176*    Medications:  Scheduled:  . antiseptic oral rinse  7 mL Mouth Rinse QID  . chlorhexidine gluconate  15 mL Mouth Rinse BID  . fentaNYL   Intravenous 6 times per day  . insulin aspart  0-20 Units Subcutaneous 6 times per day  . metoCLOPramide (REGLAN) injection  10 mg Intravenous 4 times per day  . pantoprazole (PROTONIX) IV  40 mg Intravenous Q24H  . sodium chloride flush  10-40 mL Intracatheter Q12H    Insulin Requirements in the past 24 hours:   11 units resistant SSI (on cyclic TPN) + 15 units of regular insulin in TPN  Assessment: 44 YOF admitted on 03/27/15 with back and leg pain and underwent L5 laminectomy with facetectomy later that day. Intraoperatively patient went into shock and CT revealed large left RP hematoma. Patient returned to the OR for ex-lap with repair of left iliac vein, ligation of distal left CIA, left CIA-CFA bypass, and placement of abdominal VAC. Pharmacy consulted to manage TPN for nutritional support.  GI: hx GERD / obesity. Baseline prealbumin low at 8.4 >> 14.1 (frequent OR visits for abd washout) - s/p abdominal wall closure with mesh on 3/1. Found to have a large abd wall hematoma 3/6, s/p evacuation 3/8. Emesis/NG O/P 520mL, drains 185mL. Wants ice chips, but o/w (+)N & gagging.  PPI IV, Reglan  IV  Endo: no hx DM - hypoglycemic prior to TPN initiation. Converted from continuous to cyclic again on 3/9, CBGs on TPN 155-176. Lytes: all WNL except slightly low iCa on 3/8 (Ca gluconate 2gm given 3/8) Renal: SCr stable, CrCL > 100 ml/min - good UOP 0.7 ml/kg/hr, I/O (+)885, NS at 10 ml/hr Pulm: extubated 3/2, reintubated  3/6, extubated 3/9 & RA today. Cards: angina - BP soft, sinus tach - Lasix IV BID AC: holding heparin for new left femoral DVT (dx 03/29/15) d/t bleeding, s/p IVC filter placement 3/7.  ABLA - hgb 8, plts WNL, INR 1.3 (3/8) Hepatobil: 3/9:  AST/ALT mildly elevated, tbili improved to 2.6 (RN noted a little jaundice in sclera on 3/3, resolved now). TG improved to 210 Neuro: off sedation.  Fentanyl PCA ID: s/p Vanc/Primaxin - afebrile, WBC improving- wnl today Best Practices: holding heparin gtt, MC TPN Access: right IJ placed 2/17>>2/25, triple lumen PICC 2/25>> TPN start date: 2/21 >>  Current Nutrition:  Clinimix  Propofol at 23.3 ml/hr (started 3/6, stopped 3/9 AM) = 615 kCal   Nutritional Goals: 2000-2200 kCal and 130-140 gm protein per day  Plan:  -  Continue Cyclic TPN as  patient is extubated and off sedation. Clinimix E 5/15, cycle 2636 mLs over 14 hrs: 174ml/hr x 1 hr, then 203 ml/hr x 12 hrs, and 100 ml/hr x 1 hr - Lipids 20% on Tues and Fri to minimize kCal provision - TPN will provide a weekly average of 2007 kCal per day and 132gm protein per day, meeting 100% of patient's needs - Continue multivitamin and trace elements in TPN - Increase insulin in TPN to 20 units (previously 25 units while on cyclic TPN) and continue resistant SSI Q4H - F/U daily  Marisue Humble, PharmD Clinical Pharmacist [facility]

## 2015-04-17 NOTE — Progress Notes (Signed)
PULMONARY / CRITICAL CARE MEDICINE   Name: Kristina Estes MRN: [id number] DOB: 08/02/1970    ADMISSION DATE:  03/27/2015  CONSULTATION DATE: 03/27/15  REFERRING MD: Conchita ParisNundkumar  CHIEF COMPLAINT: Hemorrhagic shock  SUBJECTIVE:  Wants to get out of bed and eat.  VITAL SIGNS: BP 112/64 mmHg  Pulse 107  Temp(Src) 98.8 F (37.1 C) (Oral)  Resp 26  Ht 5\' 3"  (1.6 m)  Wt 285 lb (129.275 kg)  BMI 50.50 kg/m2  SpO2 100%  LMP 03/27/2015  INTAKE / OUTPUT: I/O last 3 completed shifts: In: 5663.5 [I.V.:617.5; NG/GT:90; IV Piggyback:600] Out: 5645 [Urine:4890; Emesis/NG output:520; Drains:235]   PHYSICAL EXAMINATION: General: alert Neuro: follows commands HEENT: no stridor Cardiac: regular, tachycardic Chest: no wheeze Abd: wound site clean Ext: 1+ edema Skin: no rashes   LABS:  BMET  Recent Labs Lab 04/15/15 0413 04/16/15 0416 04/17/15 0449  NA 137 137 136  K 4.6 4.4 4.2  CL 106 101 102  CO2 24 24 26   BUN 27* 21* 21*  CREATININE 0.88 0.82 0.63  GLUCOSE 159* 133* 181*    Electrolytes  Recent Labs Lab 04/13/15 0456  04/15/15 0413 04/16/15 0416 04/17/15 0449  CALCIUM 7.9*  < > 7.8* 8.3* 8.2*  MG 2.0  --   --  2.0  --   PHOS 3.9  --   --  4.5  --   < > = values in this interval not displayed.  CBC  Recent Labs Lab 04/15/15 0413 04/16/15 0416 04/17/15 0449  WBC 20.4* 15.2* 10.1  HGB 7.9* 8.6* 8.0*  HCT 23.2* 26.1* 25.7*  PLT 318 343 348    Liver Enzymes  Recent Labs Lab 04/14/15 0400 04/15/15 0413 04/16/15 0416  AST 37 35 47*  ALT 63* 55* 56*  ALKPHOS 85 98 126  BILITOT 3.7* 2.7* 2.6*  ALBUMIN 1.4* 1.5* 1.7*   Glucose  Recent Labs Lab 04/16/15 1132 04/16/15 1524 04/16/15 1934 04/16/15 2333 04/17/15 0345 04/17/15 0828  GLUCAP 168* 102* 155* 162* 176* 163*    Imaging No results found.   STUDIES:  2/17 CT abd/pelvis >>Lt sided retroperitoneal hematoma 2/19 Doppler b/l legs >> DVT Lt common femoral vein 3/06 CT abdomen >>  ventral fluid collection abscess/hematoma 3000 cm volume, Lt common femoral vein DVT  CULTURES: 2/23 Blood >> negative 2/24 Sputum >> Few candida 3/06 Abdominal wound >> negative  ANTIBIOTICS: 2/23 Vancomycin >> 2/26 2/23 Zosyn >> 3/02 3/06 Imipenem >> 3/09 3/06 Vancomycin >> 3/09   SIGNIFICANT EVENTS: 2/17 L5 laminectomy >> retroperitoneal hemorrhage; vascular surgery consulted >> Lt common iliac to Lt common femoral bypass 2/19 abdominal washout, wound vac; DVT Lt leg >> start heparin gtt 2/20 General surgery consulted 2/21 To OR 2/23 To OR 2/27 To OR 3/01 To OR >> abdominal wall closure with mesh 3/06 Hypotension, VDRF, anemia >> hematoma in abdominal wall >> to OR 3/07 IVC filter placed by VVS 3/08 Abdomen closed  LINES/TUBES: 2/17 IJ CVL >> 2/25 2/17 ETT >> 3/02 2/25 Rt PICC >>  3/06 ETT >> 3/09  DISCUSSION: 45 yo female with back and leg pain from L5-S1 spondylolisthesis.  She had L5 laminectomy on 2/17 >> c/b retroperitoneal hemorrhage.  She required laparotomy with Lt common iliac to common femoral bypass.  Her abdomen has remained open due to edema, and she has required repeated trips to the OR.  She remains on the vent for OR trips.  She has hx of Asthma, GERD.  ASSESSMENT / PLAN:  PULMONARY A: Acute  respiratory failure in setting of retroperitoneal hemorrhage. P:  Bronchial hygiene  CARDIOVASCULAR A: Hemorrhagic shock 2nd to retroperitoneal bleeding >> resolved. Lt leg DVT noted 2/19, Lt common femoral DVT on CT abd 3/06. P: S/p IVC filter 3/07  RENAL A:  Hypervolemia >> improved. P:  F/u renal fx  GASTROINTESTINAL A:  S/p laparotomy after retroperitoneal hemorrhage. Reexploration 3/06 for abdominal wall hematoma. Nutrition. P: Post op care, nutrition per surgery  HEMATOLOGIC A:  Anemia 2nd to acute blood loss, and critical illness. P:  F/u CBC Transfuse for Hb < 7 or bleeding  INFECTIOUS A:  HCAP >> completed ABx  3/02. Possible abdominal wall abscess >> cx negative. P:  Monitor off Abx  ENDOCRINE A:  Hyperglycemia. P:  SSI  NEUROLOGIC A:  L5-S1 spondylolisthesis. Post op pain control P:  Fentanyl PCA Will need spine surgery when more stable  Updated pt's Mother at bedside.  Call if help needed over weekend.  If she remains in ICU, then PCCM will f/u on Monday 04/20/15 >> if she transfers out of ICU, then recommend consulting [facility] if medical assistance is needed.   Coralyn Helling, MD [facility] Pulmonary/Critical Care 04/17/2015, 10:24 AM Pager:  [phone] After 3pm call: [phone]

## 2015-04-17 NOTE — Progress Notes (Signed)
Orthopedic Tech Progress Note Patient Details:  Jackquline BoschLaverne Throne 08/21/1970 [id number]  Patient ID: Jackquline BoschLaverne Hartinger, female   DOB: 03/15/1970, 45 y.o.   MRN: [id number] Called in bio-tech brace order; spoke with Richardean Chimeraathy  Eulon Allnutt 04/17/2015, 3:10 PM

## 2015-04-17 NOTE — Progress Notes (Signed)
Patient ID: Jackquline BoschLaverne Apuzzo, female   DOB: 03/10/1970, 45 y.o.   MRN: [id number] Patient doing well extubated pain seems to be well-controlled  Strength 5 out of 5 in her lower extremities  Extensively discussed the patient's situation with the patient and her mother she really wants to get out of bed I explained to her the risks mobilization with her procedure only being half completed. She says this really difficult for her not to be able to get out of bed mentally and emotionally. I extensively reviewed the risks of weakness in her feet nerve injury it's irreversible nature and the fact that overall her mobilization should not proceed until stabilization is completed. We have agreed to check an upright x-ray while she is in bed with head of bed elevated accordingly we will utilize the results of that x-ray to determine whether we can get her out of bed. I did explain to her that if she started experiencing worsening back pain any numbness or tingling or weakness in her feet and she immediately had to lie back down and let us know. She understands that even this may result in permanent nerve injury.

## 2015-04-17 NOTE — Progress Notes (Signed)
2 Days Post-Op  Subjective: Extubated.  Hemodynamically stable.  Alert.  Afebrile No wound problems per nursing staff. Drainage thin and serosanguineous. Wants ice chips but otherwise says she is nauseated and gag some talked with patient and mother about care plan neurosurgery notes reviewed.  They states she cannot get out of bed at this time because of spine surgery  Potassium 4.2.  Creatinine 0.63.  Glucose 181.  WBC down 10,100.  Hemoglobin 8.0.  Objective: Vital signs in last 24 hours: Temp:  [98.3 F (36.8 C)-99.1 F (37.3 C)] 98.3 F (36.8 C) (03/10 0406) Pulse Rate:  [100-115] 101 (03/10 0300) Resp:  [16-29] 26 (03/10 0400) BP: (102-125)/(52-82) 115/67 mmHg (03/10 0300) SpO2:  [99 %-100 %] 100 % (03/10 0400) FiO2 (%):  [40 %] 40 % (03/09 0831) Last BM Date: 04/12/15  Intake/Output from previous day: 03/09 0701 - 03/10 0700 In: 2941.9 [I.V.:67.9; NG/GT:90; IV Piggyback:200; WJX:9147]TPN:2584] Out: 2505 [Urine:1835; Emesis/NG output:520; Drains:150] Intake/Output this shift: Total I/O In: 1644 [I.V.:20; TPN:1624] Out: 1220 [Urine:850; Emesis/NG output:320; Drains:50]  General appearance: Alert.  Minimal distress.  Seems reasonably well oriented.  Carries on good conversation.  Skin warm and dry. Resp: clear to auscultation bilaterally GI: Abdomen soft.  Minimally tender.  Obese.  Midline wound looks good.  Drainage serosanguineous.  Lab Results:  Results for orders placed or performed during the hospital encounter of 03/27/15 (from the past 24 hour(s))  Glucose, capillary     Status: Abnormal   Collection Time: 04/16/15  8:05 AM  Result Value Ref Range   Glucose-Capillary 122 (H) 65 - 99 mg/dL   Comment 1 Capillary Specimen   Glucose, capillary     Status: Abnormal   Collection Time: 04/16/15 11:32 AM  Result Value Ref Range   Glucose-Capillary 168 (H) 65 - 99 mg/dL   Comment 1 Document in Chart   Glucose, capillary     Status: Abnormal   Collection Time: 04/16/15  3:24  PM  Result Value Ref Range   Glucose-Capillary 102 (H) 65 - 99 mg/dL   Comment 1 Document in Chart   Glucose, capillary     Status: Abnormal   Collection Time: 04/16/15  7:34 PM  Result Value Ref Range   Glucose-Capillary 155 (H) 65 - 99 mg/dL   Comment 1 Capillary Specimen   Glucose, capillary     Status: Abnormal   Collection Time: 04/16/15 11:33 PM  Result Value Ref Range   Glucose-Capillary 162 (H) 65 - 99 mg/dL  Glucose, capillary     Status: Abnormal   Collection Time: 04/17/15  3:45 AM  Result Value Ref Range   Glucose-Capillary 176 (H) 65 - 99 mg/dL   Comment 1 Capillary Specimen    Comment 2 Notify RN    Comment 3 Document in Chart   CBC     Status: Abnormal   Collection Time: 04/17/15  4:49 AM  Result Value Ref Range   WBC 10.1 4.0 - 10.5 K/uL   RBC 2.77 (L) 3.87 - 5.11 MIL/uL   Hemoglobin 8.0 (L) 12.0 - 15.0 g/dL   HCT 82.925.7 (L) 56.236.0 - 13.046.0 %   MCV 92.8 78.0 - 100.0 fL   MCH 28.9 26.0 - 34.0 pg   MCHC 31.1 30.0 - 36.0 g/dL   RDW 86.517.1 (H) 78.411.5 - 69.615.5 %   Platelets 348 150 - 400 K/uL  Basic metabolic panel     Status: Abnormal   Collection Time: 04/17/15  4:49 AM  Result Value Ref  Range   Sodium 136 135 - 145 mmol/L   Potassium 4.2 3.5 - 5.1 mmol/L   Chloride 102 101 - 111 mmol/L   CO2 26 22 - 32 mmol/L   Glucose, Bld 181 (H) 65 - 99 mg/dL   BUN 21 (H) 6 - 20 mg/dL   Creatinine, Ser 6.60 0.44 - 1.00 mg/dL   Calcium 8.2 (L) 8.9 - 10.3 mg/dL   GFR calc non Af Amer >60 >60 mL/min   GFR calc Af Amer >60 >60 mL/min   Anion gap 8 5 - 15     Studies/Results: Dg Chest Port 1 View  04/16/2015  CLINICAL DATA:  45 year old female with ventral abdominal abscess. Respiratory failure. Initial encounter. EXAM: PORTABLE CHEST 1 VIEW COMPARISON:  04/14/2015. CT Abdomen and Pelvis 04/13/2015, and earlier. FINDINGS: Portable AP semi upright view at 0617 hours. Stable endotracheal tube tip at the lower level of the clavicles. Enteric tube courses to the abdomen, tip not  included. Stable cardiac size and mediastinal contours. Other external lines and tubes project about the chest, more so on the left. Lower lung volumes. No pneumothorax or pulmonary edema. No confluent pulmonary opacity or pleural effusion identified. IMPRESSION: 1.  Stable lines and tubes. 2. Low lung volumes, otherwise no acute cardiopulmonary abnormality. Electronically Signed   By: Odessa Fleming M.D.   On: 04/16/2015 07:54    . antiseptic oral rinse  7 mL Mouth Rinse QID  . chlorhexidine gluconate  15 mL Mouth Rinse BID  . fentaNYL   Intravenous 6 times per day  . insulin aspart  0-20 Units Subcutaneous 6 times per day  . metoCLOPramide (REGLAN) injection  10 mg Intravenous 4 times per day  . pantoprazole (PROTONIX) IV  40 mg Intravenous Q24H  . sodium chloride flush  10-40 mL Intracatheter Q12H     Assessment/Plan: s/p Procedure(s): Exploration of abdominal wound CLOSURE OF ABDOMINAL WOUND  S/p L5 laminectomy (2/17/7 - Dr. Conchita Paris)  Left iliac vein and left common iliac artery bleeding  S/p Ex lap, repair of left iliac vein, ligation of distal left common iliac artery, left common iliac artery to left common femoral bypass with 8mm Dacron, placement of abdominal negative pressure dressing (03/27/15 - Early and Imogene Burn)  Subsequently 4 abdominal washouts and wound vac changes 2/19, 2/21, 2/23, 2/27 Imogene Burn) POD#7 s/p exploratory laparotomy, abdominal wound closure with Phasix mesh, drain placement(Dr. Derrell Lolling)  Post-operative abdominal wall hematoma POD #4 s/p Exploration of abdominal wound, evacuation of hematoma, placement of negative pressure dressing (Ekta Dancer)(04/13/2015)  POD#2 wound exploration, evacuation of residual hematoma, closure of sking and subq tissue(Dr. Derrell Lolling) Abdominal binder as needed.  May be released from time to time Daily dressing changes with dry 4 x 4's Ileus Leave drains and NG for today.  ABLA-stable  Leukocytosis- trending down. On Vanc and primaxin   PCM-TPN for now Left common iliac DVT-s/p IVC filter  Disp - continue ICU  @  LOS: 21 days    Kristina Estes M 04/17/2015  . .prob

## 2015-04-18 LAB — ANAEROBIC CULTURE

## 2015-04-18 LAB — GLUCOSE, CAPILLARY
GLUCOSE-CAPILLARY: 162 mg/dL — AB (ref 65–99)
GLUCOSE-CAPILLARY: 169 mg/dL — AB (ref 65–99)
GLUCOSE-CAPILLARY: 70 mg/dL (ref 65–99)
Glucose-Capillary: 138 mg/dL — ABNORMAL HIGH (ref 65–99)
Glucose-Capillary: 107 mg/dL — ABNORMAL HIGH (ref 65–99)
Glucose-Capillary: 127 mg/dL — ABNORMAL HIGH (ref 65–99)

## 2015-04-18 MED ORDER — TRACE MINERALS CR-CU-MN-SE-ZN 10-1000-500-60 MCG/ML IV SOLN
INTRAVENOUS | Status: AC
  Administered 2015-04-18: 17:00:00 via INTRAVENOUS
  Filled 2015-04-18: qty 2636

## 2015-04-18 MED ORDER — TRACE MINERALS CR-CU-MN-SE-ZN 10-1000-500-60 MCG/ML IV SOLN: INTRAVENOUS | Status: DC

## 2015-04-18 MED ORDER — ONDANSETRON HCL 4 MG/2ML IJ SOLN
4.0000 mg | Freq: Four times a day (QID) | INTRAMUSCULAR | Status: DC
  Administered 2015-04-18: 4 mg via INTRAVENOUS

## 2015-04-18 MED ORDER — FENTANYL CITRATE (PF) 100 MCG/2ML IJ SOLN
50.0000 ug | INTRAMUSCULAR | Status: DC | PRN
  Administered 2015-04-18 – 2015-04-20 (×4): 100 ug via INTRAVENOUS
  Administered 2015-04-20: 50 ug via INTRAVENOUS
  Administered 2015-04-20 – 2015-04-23 (×24): 100 ug via INTRAVENOUS
  Administered 2015-04-24 – 2015-04-25 (×4): 50 ug via INTRAVENOUS
  Filled 2015-04-18 (×34): qty 2

## 2015-04-18 MED ORDER — ONDANSETRON HCL 4 MG/2ML IJ SOLN: 4.0000 mg | Freq: Four times a day (QID) | INTRAMUSCULAR | Status: DC | PRN

## 2015-04-18 MED ORDER — ONDANSETRON HCL 4 MG/2ML IJ SOLN
INTRAMUSCULAR | Status: AC
  Filled 2015-04-18: qty 2

## 2015-04-18 NOTE — Progress Notes (Signed)
Orthopedic Tech Progress Note Patient Details:  Kristina BoschLaverne Estes 09/12/1970 [id number]  Ortho Devices Type of Ortho Device: Abdominal binder   Saul FordyceJennifer C Sharlena Kristensen 04/18/2015, 10:52 AM

## 2015-04-18 NOTE — Progress Notes (Signed)
Rehab Admissions Coordinator Note:  Patient was screened by Clois DupesBoyette, Kenon Delashmit Godwin for appropriateness for an Inpatient Acute Rehab Consult per PT recommendation.  At this time, we are recommending an inpt rehab consult and OT consult.  Clois DupesBoyette, Danner Paulding Godwin 04/18/2015, 6:35 PM  I can be reached at [phone].

## 2015-04-18 NOTE — Progress Notes (Signed)
PARENTERAL NUTRITION CONSULT NOTE - FOLLOW UP  Pharmacy Consult for TPN Indication: Open abdomen, s/p closure  Allergies  Allergen Reactions  . Propoxyphene Other (See Comments)    Unspecified    Patient Measurements: Height:  (160 cm) Weight: 285 lb (129.275 kg) IBW/kg (Calculated) : 52.4 Adjusted Body Weight:  Usual Weight:   Vital Signs: Temp: 98.6 F (37 C) (03/11 0000) Temp Source: Oral (03/11 0000) BP: 108/64 mmHg (03/11 0700) Pulse Rate: 106 (03/11 0700) Intake/Output from previous day: 03/10 0701 - 03/11 0700 In: 3111 [P.O.:360; I.V.:40; NG/GT:180; TPN:2531] Out: 3245 [Urine:2000; Emesis/NG output:1150; Drains:95] Intake/Output from this shift: Total I/O In: 130 [NG/GT:30; TPN:100] Out: 125 [Urine:125]  Labs:  Recent Labs  04/16/15 0416 04/17/15 0449  WBC 15.2* 10.1  HGB 8.6* 8.0*  HCT 26.1* 25.7*  PLT 343 348     Recent Labs  04/16/15 0416 04/17/15 0449  NA 137 136  K 4.4 4.2  CL 101 102  CO2 24 26  GLUCOSE 133* 181*  BUN 21* 21*  CREATININE 0.82 0.63  CALCIUM 8.3* 8.2*  MG 2.0  --   PHOS 4.5  --   PROT 5.8*  --   ALBUMIN 1.7*  --   AST 47*  --   ALT 56*  --   ALKPHOS 126  --   BILITOT 2.6*  --   TRIG 210*  --    Estimated Creatinine Clearance: 116.6 mL/min (by C-G formula based on Cr of 0.63).    Recent Labs  04/17/15 1938 04/17/15 2342 04/18/15 0402  GLUCAP 121* 162* 169*    Medications:  Scheduled:  . antiseptic oral rinse  7 mL Mouth Rinse q12n4p  . antiseptic oral rinse  7 mL Mouth Rinse QID  . chlorhexidine  15 mL Mouth Rinse BID  . chlorhexidine gluconate  15 mL Mouth Rinse BID  . fentaNYL   Intravenous 6 times per day  . insulin aspart  0-20 Units Subcutaneous 6 times per day  . metoCLOPramide (REGLAN) injection  10 mg Intravenous 4 times per day  . pantoprazole (PROTONIX) IV  40 mg Intravenous Q24H  . sodium chloride flush  10-40 mL Intracatheter Q12H    Insulin Requirements in the past 24 hours:  11  units resistant SSI (on cyclic TPN) + 20 units of regular insulin in TPN  Assessment: 44 YOF admitted on 03/27/15 with back and leg pain and underwent L5 laminectomy with facetectomy later that day. Intraoperatively patient went into shock and CT revealed large left RP hematoma. Patient returned to the OR for ex-lap with repair of left iliac vein, ligation of distal left CIA, left CIA-CFA bypass, and placement of abdominal VAC. Pharmacy consulted to manage TPN for nutritional support.  GI: hx GERD / obesity. Baseline prealbumin low at 8.4 >> 14.1 (frequent OR visits for abd washout) - s/p abdominal wall closure with mesh on 3/1. Found to have a large abd wall hematoma 3/6, s/p evacuation 3/8. Emesis/NG O/P , drains 95mL. Wants ice chips, but o/w (+)N & gagging. PPI IV, Reglan IV  Endo: no hx DM - hypoglycemic prior to TPN initiation. CBGs on TPN 121-169 (improved) and 80-90 off. Lytes: all WNL except slightly low iCa on 3/8 (Ca gluconate 2gm given 3/8).  Corr Ca 10 on 3/10. No labs today Renal: SCr stable, CrCL > 100 ml/min - good UOP 0.7 ml/kg/hr, I/O even, NS at 10 ml/hr Pulm: extubated 3/2, reintubated 3/6, extubated 3/9 & RA today. Cards: angina - BP soft,  sinus tach - Lasix IV BID AC: holding heparin for new left femoral DVT (dx 03/29/15) d/t bleeding, s/p IVC filter placement 3/7. ABLA - hgb 8, plts WNL, INR 1.3 (3/8) Hepatobil: 3/9: AST/ALT mildly elevated, tbili improved to 2.6 (RN noted a little jaundice in sclera on 3/3, resolved now). TG improved to 210 Neuro: off sedation. Fentanyl PCA ID: s/p Vanc/Primaxin - afebrile, WBC improving- wnl today Best Practices: holding heparin gtt, MC TPN Access: right IJ placed 2/17>>2/25, triple lumen PICC 2/25>> TPN start date: 2/21 >>  Current Nutrition:  Clinimix  Propofol at 23.3 ml/hr (started 3/6, stopped 3/9 AM) = 615 kCal   Nutritional Goals: 2000-2200 kCal and 130-140 gm protein per day  Plan:  - Continue Cyclic  TPN as patient is extubated and off sedation. Clinimix E 5/15, cycle 2636 mLs over 14 hrs: 13300ml/hr x 1 hr, then 203 ml/hr x 12 hrs, and 100 ml/hr x 1 hr - Lipids 20% on Tues and Fri to minimize kCal provision - TPN will provide a weekly average of 2007 kCal per day and 132gm protein per day, meeting 100% of patient's needs - Continue multivitamin and trace elements in TPN - Increase insulin in TPN to 25 units (previously 25 units while on cyclic TPN) and continue resistant SSI Q4H  Marisue HumbleKendra Jaycee Pelzer, PharmD Clinical Pharmacist [facility]

## 2015-04-18 NOTE — Progress Notes (Signed)
Patient ID: Kristina Estes, female   DOB: 05/18/1970, 45 y.o.   MRN: [id number] Patient has been fitted for a brace the brace is been delivered. She continues to do fairly well with her back and leg pain. Neurologically she is intact with 5 out of 5 strength. We have agreed to let her sit on the side of the bed with the understanding that she as well as no she experiences any worsening pain in her back or any numbness or tingling or pain down her legs. She is only allowed to sit with the brace on she tolerates sitting on the edge of the brace with a big brace on we'll let her move to a chair at which point we'll check an upright x-ray. She again understands if she expresses any numbness tingling or radicular pain to notify us that she has to Luna PierGaray back to bed. I do not want her ambulating in the halls I do not want her to be in the chair for extended periods of time initially. If the x-rays okay and she's cleared that we might be able to mobilize and relax some any restrictions.

## 2015-04-18 NOTE — Progress Notes (Signed)
[facility]/Kristina Estes Progress Note 3 Days Post-Op  Subjective: Patient is awake and alert.  Vomited yesterday.  Says she has been passing gas.  Objective: Vital signs in last 24 hours: Temp:  [97.9 F (36.6 C)-98.6 F (37 C)] 98.2 F (36.8 C) (03/11 0833) Pulse Rate:  [100-119] 106 (03/11 0700) Resp:  [17-31] 18 (03/11 0800) BP: (69-126)/(20-80) 108/64 mmHg (03/11 0700) SpO2:  [96 %-100 %] 96 % (03/11 0800) Last BM Date: 04/07/15  Intake/Output from previous day: 03/10 0701 - 03/11 0700 In: 3111 [P.O.:360; I.V.:40; NG/GT:180; TPN:2531] Out: 3245 [Urine:2000; Emesis/NG output:1150; Drains:95] Intake/Output this shift: Total I/O In: 130 [NG/GT:30; TPN:100] Out: 125 [Urine:125]  General: No acute distress.  Anxious to get out of bed and to eat.  Lungs: Clear  Abd: Soft, minimally tender, Absent or very hypoactive bowel sounds.  Wound is clear and dry.  Lower abdominal wall drain sites with a little bleeding at the sites.  Extremities: No clinical signs or symptoms of DVT.  Neuro: Intact  Lab Results:  @LABLAST2 (wbc:2,hgb:2,hct:2,plt:2) BMET ) Recent Labs  04/16/15 0416 04/17/15 0449  NA 137 136  K 4.4 4.2  CL 101 102  CO2 24 26  GLUCOSE 133* 181*  BUN 21* 21*  CREATININE 0.82 0.63  CALCIUM 8.3* 8.2*   PT/INR No results for input(s): LABPROT, INR in the last 72 hours. ABG No results for input(s): PHART, HCO3 in the last 72 hours.  Invalid input(s): PCO2, PO2  Studies/Results: No results found.  Anti-infectives: Anti-infectives    Start     Dose/Rate Route Frequency Ordered Stop   04/15/15 0930  polymyxin B 500,000 Units, bacitracin 50,000 Units in sodium chloride irrigation 0.9 % 500 mL irrigation  Status:  Discontinued       As needed 04/15/15 0930 04/15/15 0959   04/13/15 2230  vancomycin (VANCOCIN) IVPB 1000 mg/200 mL premix  Status:  Discontinued     1,000 mg 200 mL/hr over 60 Minutes Intravenous Every 12 hours 04/13/15 0948 04/16/15 1007   04/13/15 1200   imipenem-cilastatin (PRIMAXIN) 500 mg in sodium chloride 0.9 % 100 mL IVPB  Status:  Discontinued     500 mg 200 mL/hr over 30 Minutes Intravenous 4 times per day 04/13/15 0946 04/16/15 1007   04/13/15 1000  ceFAZolin (ANCEF) IVPB 2 g/50 mL premix  Status:  Discontinued     2 g 100 mL/hr over 30 Minutes Intravenous 3 times per day 04/13/15 0901 04/13/15 0936   04/13/15 1000  vancomycin (VANCOCIN) 2,500 mg in sodium chloride 0.9 % 500 mL IVPB     2,500 mg 250 mL/hr over 120 Minutes Intravenous  Once 04/13/15 0948 04/13/15 1405   04/02/15 2330  vancomycin (VANCOCIN) IVPB 1000 mg/200 mL premix  Status:  Discontinued     1,000 mg 200 mL/hr over 60 Minutes Intravenous Every 12 hours 04/02/15 1015 04/05/15 0849   04/02/15 1830  piperacillin-tazobactam (ZOSYN) IVPB 3.375 g  Status:  Discontinued     3.375 g 12.5 mL/hr over 240 Minutes Intravenous 3 times per day 04/02/15 1015 04/09/15 0804   04/02/15 1100  cefUROXime (ZINACEF) 1.5 g in dextrose 5 % 50 mL IVPB  Status:  Discontinued     1.5 g 100 mL/hr over 30 Minutes Intravenous To [facility]Stay [facility] 04/01/15 1106 04/02/15 1448   04/02/15 1100  vancomycin (VANCOCIN) 2,500 mg in sodium chloride 0.9 % 500 mL IVPB     2,500 mg 250 mL/hr over 120 Minutes Intravenous  Once 04/02/15 1029 04/02/15 1342  04/02/15 1030  vancomycin (VANCOCIN) 2,250 mg in sodium chloride 0.9 % 500 mL IVPB  Status:  Discontinued     2,250 mg 250 mL/hr over 120 Minutes Intravenous  Once 04/02/15 1015 04/02/15 1029   04/02/15 1030  piperacillin-tazobactam (ZOSYN) IVPB 3.375 g     3.375 g 100 mL/hr over 30 Minutes Intravenous  Once 04/02/15 1015 04/02/15 1212   03/27/15 2130  cefUROXime (ZINACEF) 1.5 g in dextrose 5 % 50 mL IVPB  Status:  Discontinued     1.5 g 100 mL/hr over 30 Minutes Intravenous Every 12 hours 03/27/15 2124 03/27/15 2132   03/27/15 1600  ceFAZolin (ANCEF) IVPB 2 g/50 mL premix     2 g 100 mL/hr over 30 Minutes Intravenous Every 8 hours 03/27/15 1322  03/28/15 0759   03/27/15 1447  ceFAZolin (ANCEF) 1-5 GM-% IVPB    Comments:  Merril Abbe   : cabinet override      03/27/15 1447 03/28/15 0259   03/27/15 0800  bacitracin 50,000 Units in sodium chloride irrigation 0.9 % 500 mL irrigation  Status:  Discontinued       As needed 03/27/15 0943 03/27/15 1223   03/27/15 0700  ceFAZolin (ANCEF) 3 g in dextrose 5 % 50 mL IVPB     3 g 130 mL/hr over 30 Minutes Intravenous To ShortStay Surgical 03/26/15 1409 03/27/15 0750      Assessment/Plan: s/p Procedure(s): Exploration of abdominal wound CLOSURE OF ABDOMINAL WOUND Ileus, still too soon to clamp NGT or start liquids.  Keep NGT on suction.  Patient tends to be sick when tube is clamps or off suction.  LOS: 22 days   Marta Lamas. Gae Bon, MD, FACS [phone] [facility] Surgery 04/18/2015

## 2015-04-18 NOTE — Progress Notes (Signed)
   Daily Progress Note  Assessment/Planning: S/p x-lap, Repair L CIV, Ligation distal L CIA, L CIA to CFA bypass, s/p multiple abd vac changes x 4 (03/27/15) S/p Abd closure with mesh (04/08/15), S/p Evac of abd hematoma (04/13/15), S/p closure of subcutaneous skin (04/15/15)   Wet-to-dry dressing L groin BID  Will periodically check on pt  Restart anticoagulation once Gen Surgery comfortable with such  IVC filter will need to be removed ideally within 2-3 months  Subjective  - 3 Days Post-Op  Extubated, remains off vent  Objective Filed Vitals:   04/18/15 0600 04/18/15 0700 04/18/15 0800 04/18/15 0833  BP: 104/67 108/64    Pulse: 108 106    Temp:    98.2 F (36.8 C)  TempSrc:    Oral  Resp: 27 27 18    Height:      Weight:      SpO2: 100% 100% 96%     Intake/Output Summary (Last 24 hours) at 04/18/15 0922 Last data filed at 04/18/15 0800  Gross per 24 hour  Intake   2978 ml  Output   3220 ml  Net   -242 ml   VASC  L groin: intact except for 1 cm segment with viable subcutaneous fat visible, warm left foot, edema 1+, faintly palpable PT  Laboratory CBC    Component Value Date/Time   WBC 10.1 04/17/2015 0449   HGB 8.0* 04/17/2015 0449   HCT 25.7* 04/17/2015 0449   PLT 348 04/17/2015 0449    BMET    Component Value Date/Time   NA 136 04/17/2015 0449   K 4.2 04/17/2015 0449   CL 102 04/17/2015 0449   CO2 26 04/17/2015 0449   GLUCOSE 181* 04/17/2015 0449   BUN 21* 04/17/2015 0449   CREATININE 0.63 04/17/2015 0449   CALCIUM 8.2* 04/17/2015 0449   GFRNONAA >60 04/17/2015 0449   GFRAA >60 04/17/2015 0449    Leonides SakeBrian Chen, MD [facility] Office: [phone] Pager: [phone]  04/18/2015, 9:22 AM

## 2015-04-18 NOTE — Evaluation (Signed)
Physical Therapy Evaluation Patient Details Name: Kristina Estes MRN: [id number] DOB: 10-Sep-1970 Today's Date: 04/18/2015   History of Present Illness  Pt admitted for surgery on 03/27/15, by Dr. Adam Phenix, for L5-S1 procedure; pt dropped her blood pressure and the surgery was discontinued and she was closed. She returned to the Neuro ICU and was transfused with 2 units of PRBC and requiring pressors when seen by Dr. Imogene Burn. She was hemodynamically unstable and taken back to the OR. She underwent Exploratory laparotomy control of bleeding and left common iliac to left common femoral bypass with 8 mm Hemashield graft, 03/27/15. In addition to this she got a Left common iliac artery to left common femoral artery bypass with 8 mm Dacron, and placement of abdominal negative pressure dressing, by Dr. Imogene Burn at that time also. On 03/29/15 Dr,. Imogene Burn took her back to the OR for abdominal washout, and new negative pressure dressing.  Underwent multiple return to OR for wound washout, evacuation of hematoma, and vac changes.  Developed DVT and underwent IVC filter placement.  Finally had wound closure on 04/15/15 and was extubated  Clinical Impression  Patient presents with decreased mobility due to deficits listed in PT problem list.  She will benefit from skilled PT in the acute setting to allow return home following CIR level rehab stay after posterior spinal stabilization.  Patient able to get to chair this session with all neuro signs WNL, but after sitting in bariatric chair for about 40 minutes began vomiting (question choking on mucous in airway since improved once coughed up.)  So assisted nursing to place chair in stretcher position and transfer back to bed with total A.  Will follow along as pt able to tolerate.    Follow Up Recommendations CIR    Equipment Recommendations  Rolling walker with 5" wheels    Recommendations for Other Services Rehab consult;OT consult     Precautions / Restrictions  Precautions Precautions: Fall Precaution Comments: abdominal binder Required Braces or Orthoses: Spinal Brace Spinal Brace: Lumbar corset (placed in supine due to needing posterior instrumentation)      Mobility  Bed Mobility Overal bed mobility: Needs Assistance Bed Mobility: Rolling;Sidelying to Sit Rolling: Mod assist Sidelying to sit: Max assist       General bed mobility comments: assist to roll with nursing help for donning new binder and brace, cues for reaching for rail and flexing knee; able to come upright slowly after legs assisted off EOB and lifting trunk upright  Transfers Overall transfer level: Needs assistance Equipment used: 2 person hand held assist Transfers: Sit to/from Stand;Stand Pivot Transfers Sit to Stand: Max assist;+2 physical assistance   Squat pivot transfers: +2 physical assistance;Max assist     General transfer comment: assist to stand from bed, then sat back down, then stand pivot with +2 A to bariatric chair  Ambulation/Gait                Stairs            Wheelchair Mobility    Modified Rankin (Stroke Patients Only)       Balance Overall balance assessment: Needs assistance   Sitting balance-Leahy Scale: Fair     Standing balance support: Bilateral upper extremity supported Standing balance-Leahy Scale: Poor Standing balance comment: weakness in LE's limiting balance                             Pertinent Vitals/Pain Pain Assessment: 0-10 Pain  Score: 3  Pain Location: abdomen Pain Descriptors / Indicators: Sore Pain Intervention(s): Monitored during session;Repositioned    Home Living Family/patient expects to be discharged to:: Inpatient rehab Living Arrangements: Parent;Spouse/significant other (lives here with spouse, but plans to go to mother's home [place]) Available Help at Discharge: Family Type of Home: House Home Access: Level entry (mother's home)     Home Layout: One  level Home Equipment: None      Prior Function Level of Independence: Independent               Hand Dominance        Extremity/Trunk Assessment   Upper Extremity Assessment: Generalized weakness           Lower Extremity Assessment: RLE deficits/detail;LLE deficits/detail RLE Deficits / Details: AROM grossly WFL (limited knee flexion due to body habitus and abdominal wound) strength grossly 4-/5 LLE Deficits / Details: AROM grossly WFL (limited knee flexion due to body habitus and abdominal wound) strength grossly 4-/5     Communication   Communication: No difficulties  Cognition Arousal/Alertness: Awake/alert Behavior During Therapy: WFL for tasks assessed/performed Overall Cognitive Status: Within Functional Limits for tasks assessed                      General Comments      Exercises        Assessment/Plan    PT Assessment Patient needs continued PT services  PT Diagnosis Difficulty walking;Generalized weakness   PT Problem List Decreased strength;Decreased activity tolerance;Decreased balance;Decreased mobility;Pain;Decreased safety awareness;Decreased knowledge of use of DME;Decreased knowledge of precautions  PT Treatment Interventions DME instruction;Balance training;Functional mobility training;Therapeutic activities;Therapeutic exercise;Patient/family education   PT Goals (Current goals can be found in the Care Plan section) Acute Rehab PT Goals Patient Stated Goal: To get OOB PT Goal Formulation: With patient Time For Goal Achievement: 05/02/15 Potential to Achieve Goals: Fair    Frequency Min 5X/week   Barriers to discharge        Co-evaluation               End of Session Equipment Utilized During Treatment: Back brace Activity Tolerance: Patient limited by fatigue Patient left: in chair Nurse Communication: Mobility status;Need for lift equipment         Time: 1110-1135 PT Time Calculation (min) (ACUTE ONLY):  25 min   Charges:   PT Evaluation $PT Eval High Complexity: 1 Procedure PT Treatments $Therapeutic Activity: 8-22 mins   PT G Codes:        Elray McgregorCynthia Wynn 04/18/2015, 1:28 PM  Sheran Lawlessyndi Wynn, PT [phone] 04/18/2015

## 2015-04-18 NOTE — Progress Notes (Signed)
Patient was transferred to 3S, room 11 with monitor on. Patient tolerated the transfer well. Report was given to the nurse prior to transfer.

## 2015-04-19 LAB — BASIC METABOLIC PANEL
ANION GAP: 6 (ref 5–15)
BUN: 18 mg/dL (ref 6–20)
CALCIUM: 8.1 mg/dL — AB (ref 8.9–10.3)
CHLORIDE: 104 mmol/L (ref 101–111)
CO2: 24 mmol/L (ref 22–32)
CREATININE: 0.61 mg/dL (ref 0.44–1.00)
GFR calc non Af Amer: >60 mL/min (ref >60)
Glucose, Bld: 162 mg/dL — ABNORMAL HIGH (ref 65–99)
Potassium: 4.1 mmol/L (ref 3.5–5.1)
SODIUM: 134 mmol/L — AB (ref 135–145)

## 2015-04-19 LAB — GLUCOSE, CAPILLARY
GLUCOSE-CAPILLARY: 138 mg/dL — AB (ref 65–99)
GLUCOSE-CAPILLARY: 106 mg/dL — AB (ref 65–99)
GLUCOSE-CAPILLARY: 141 mg/dL — AB (ref 65–99)
Glucose-Capillary: 144 mg/dL — ABNORMAL HIGH (ref 65–99)
Glucose-Capillary: 153 mg/dL — ABNORMAL HIGH (ref 65–99)
Glucose-Capillary: 126 mg/dL — ABNORMAL HIGH (ref 65–99)

## 2015-04-19 MED ORDER — TRACE MINERALS CR-CU-MN-SE-ZN 10-1000-500-60 MCG/ML IV SOLN
INTRAVENOUS | Status: AC
  Administered 2015-04-19: 18:00:00 via INTRAVENOUS
  Filled 2015-04-19: qty 2636

## 2015-04-19 NOTE — Progress Notes (Signed)
4 Days Post-Op  Subjective: Passing flatus  Mo BM  Output less overnight   Objective: Vital signs in last 24 hours: Temp:  [98.1 F (36.7 C)-98.8 F (37.1 C)] 98.6 F (37 C) (03/12 0451) Pulse Rate:  [100-108] 108 (03/12 0451) Resp:  [19-27] 23 (03/12 0451) BP: (100-149)/(53-73) 139/60 mmHg (03/12 0451) SpO2:  [100 %] 100 % (03/12 0451) Weight:  [130.636 kg (288 lb)] 130.636 kg (288 lb) (03/11 2110) Last BM Date: 04/07/15  Intake/Output from previous day: 03/11 0701 - 03/12 0700 In: 2553 [P.O.:30; NG/GT:90; NWG:9562]TPN:2433] Out: 3457.5 [Urine:2225; Emesis/NG output:1175; Drains:57.5] Intake/Output this shift:    Incision/Wound:clean  JP serous   Soft occasional BS OBESE   Lab Results:   Recent Labs  04/17/15 0449  WBC 10.1  HGB 8.0*  HCT 25.7*  PLT 348   BMET  Recent Labs  04/17/15 0449 04/19/15 0432  NA 136 134*  K 4.2 4.1  CL 102 104  CO2 26 24  GLUCOSE 181* 162*  BUN 21* 18  CREATININE 0.63 0.61  CALCIUM 8.2* 8.1*   PT/INR No results for input(s): LABPROT, INR in the last 72 hours. ABG No results for input(s): PHART, HCO3 in the last 72 hours.  Invalid input(s): PCO2, PO2  Studies/Results: No results found.  Anti-infectives: Anti-infectives    Start     Dose/Rate Route Frequency Ordered Stop   04/15/15 0930  polymyxin B 500,000 Units, bacitracin 50,000 Units in sodium chloride irrigation 0.9 % 500 mL irrigation  Status:  Discontinued       As needed 04/15/15 0930 04/15/15 0959   04/13/15 2230  vancomycin (VANCOCIN) IVPB 1000 mg/200 mL premix  Status:  Discontinued     1,000 mg 200 mL/hr over 60 Minutes Intravenous Every 12 hours 04/13/15 0948 04/16/15 1007   04/13/15 1200  imipenem-cilastatin (PRIMAXIN) 500 mg in sodium chloride 0.9 % 100 mL IVPB  Status:  Discontinued     500 mg 200 mL/hr over 30 Minutes Intravenous 4 times per day 04/13/15 0946 04/16/15 1007   04/13/15 1000  ceFAZolin (ANCEF) IVPB 2 g/50 mL premix  Status:  Discontinued     2  g 100 mL/hr over 30 Minutes Intravenous 3 times per day 04/13/15 0901 04/13/15 0936   04/13/15 1000  vancomycin (VANCOCIN) 2,500 mg in sodium chloride 0.9 % 500 mL IVPB     2,500 mg 250 mL/hr over 120 Minutes Intravenous  Once 04/13/15 0948 04/13/15 1405   04/02/15 2330  vancomycin (VANCOCIN) IVPB 1000 mg/200 mL premix  Status:  Discontinued     1,000 mg 200 mL/hr over 60 Minutes Intravenous Every 12 hours 04/02/15 1015 04/05/15 0849   04/02/15 1830  piperacillin-tazobactam (ZOSYN) IVPB 3.375 g  Status:  Discontinued     3.375 g 12.5 mL/hr over 240 Minutes Intravenous 3 times per day 04/02/15 1015 04/09/15 0804   04/02/15 1100  cefUROXime (ZINACEF) 1.5 g in dextrose 5 % 50 mL IVPB  Status:  Discontinued     1.5 g 100 mL/hr over 30 Minutes Intravenous To ShortStay Surgical 04/01/15 1106 04/02/15 1448   04/02/15 1100  vancomycin (VANCOCIN) 2,500 mg in sodium chloride 0.9 % 500 mL IVPB     2,500 mg 250 mL/hr over 120 Minutes Intravenous  Once 04/02/15 1029 04/02/15 1342   04/02/15 1030  vancomycin (VANCOCIN) 2,250 mg in sodium chloride 0.9 % 500 mL IVPB  Status:  Discontinued     2,250 mg 250 mL/hr over 120 Minutes Intravenous  Once 04/02/15  1015 04/02/15 1029   04/02/15 1030  piperacillin-tazobactam (ZOSYN) IVPB 3.375 g     3.375 g 100 mL/hr over 30 Minutes Intravenous  Once 04/02/15 1015 04/02/15 1212   03/27/15 2130  cefUROXime (ZINACEF) 1.5 g in dextrose 5 % 50 mL IVPB  Status:  Discontinued     1.5 g 100 mL/hr over 30 Minutes Intravenous Every 12 hours 03/27/15 2124 03/27/15 2132   03/27/15 1600  ceFAZolin (ANCEF) IVPB 2 g/50 mL premix     2 g 100 mL/hr over 30 Minutes Intravenous Every 8 hours 03/27/15 1322 03/28/15 0759   03/27/15 1447  ceFAZolin (ANCEF) 1-5 GM-% IVPB    Comments:  Merril Abbe   : cabinet override      03/27/15 1447 03/28/15 0259   03/27/15 0800  bacitracin 50,000 Units in sodium chloride irrigation 0.9 % 500 mL irrigation  Status:  Discontinued       As needed  03/27/15 0943 03/27/15 1223   03/27/15 0700  ceFAZolin (ANCEF) 3 g in dextrose 5 % 50 mL IVPB     3 g 130 mL/hr over 30 Minutes Intravenous To ShortStay Surgical 03/26/15 1409 03/27/15 0750      Assessment/Plan: s/p Procedure(s): Exploration of abdominal wound (N/A) CLOSURE OF ABDOMINAL WOUND (Bilateral)  Patient Active Problem List   Diagnosis Date Noted  . Spinal stenosis 04/15/2015  . Abdominal swelling   . Hematoma of abdominal wall 04/13/2015  . Acute respiratory failure (HCC)   . Atelectasis   . Encounter for imaging study to confirm nasogastric (NG) tube placement   . Pressure ulcer 04/09/2015  . Fever, unspecified   . Hypervolemia   . Acute respiratory failure with hypercapnia (HCC)   . Acute respiratory failure with hypoxia (HCC)   . Hemorrhagic shock   . 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 04/19/2015

## 2015-04-19 NOTE — Progress Notes (Signed)
PARENTERAL NUTRITION CONSULT NOTE - FOLLOW UP  Pharmacy Consult for TPN Indication: Open abdomen, s/p closure  Allergies  Allergen Reactions  . Propoxyphene Other (See Comments)    Unspecified    Patient Measurements: Height: 5\' 5"  (165.1 cm) Weight: 288 lb (130.636 kg) IBW/kg (Calculated) : 57 Adjusted Body Weight:  Usual Weight:   Vital Signs: Temp: 98.9 F (37.2 C) (03/12 0819) Temp Source: Oral (03/12 0819) BP: 130/59 mmHg (03/12 0819) Pulse Rate: 105 (03/12 0819) Intake/Output from previous day: 03/11 0701 - 03/12 0700 In: 2553 [P.O.:30; NG/GT:90; NWG:9562]TPN:2433] Out: 3457.5 [Urine:2225; Emesis/NG output:1175; Drains:57.5] Intake/Output from this shift: Total I/O In: -  Out: 750 [Urine:750]  Labs:  Recent Labs  04/17/15 0449  WBC 10.1  HGB 8.0*  HCT 25.7*  PLT 348     Recent Labs  04/17/15 0449 04/19/15 0432  NA 136 134*  K 4.2 4.1  CL 102 104  CO2 26 24  GLUCOSE 181* 162*  BUN 21* 18  CREATININE 0.63 0.61  CALCIUM 8.2* 8.1*   Estimated Creatinine Clearance: 121.1 mL/min (by C-G formula based on Cr of 0.61).    Recent Labs  04/19/15 0031 04/19/15 0349 04/19/15 0815  GLUCAP 144* 153* 138*    Medications:  Scheduled:  . antiseptic oral rinse  7 mL Mouth Rinse q12n4p  . antiseptic oral rinse  7 mL Mouth Rinse QID  . chlorhexidine  15 mL Mouth Rinse BID  . chlorhexidine gluconate  15 mL Mouth Rinse BID  . insulin aspart  0-20 Units Subcutaneous 6 times per day  . metoCLOPramide (REGLAN) injection  10 mg Intravenous 4 times per day  . pantoprazole (PROTONIX) IV  40 mg Intravenous Q24H  . sodium chloride flush  10-40 mL Intracatheter Q12H    Insulin Requirements in the past 24 hours:  11 units resistant SSI (on cyclic TPN) + 25 units of regular insulin in TPN  Assessment: 44 YOF admitted on 03/27/15 with back and leg pain and underwent L5 laminectomy with facetectomy later that day. Intraoperatively patient went into shock and CT revealed  large left RP hematoma. Patient returned to the OR for ex-lap with repair of left iliac vein, ligation of distal left CIA, left CIA-CFA bypass, and placement of abdominal VAC. Pharmacy consulted to manage TPN for nutritional support.  GI: hx GERD / obesity. Baseline prealbumin low at 8.4 >> 14.1 (frequent OR visits for abd washout) - s/p abdominal wall closure with mesh on 3/1. Found to have a large abd wall hematoma 3/6, s/p evacuation 3/8. Emesis/NG O/P 1175mL, drains 57mL. (+)flatus, (-)BM.  If (-)N/V x 6hr, will remove NGT today & start ice chips. PPI IV, Reglan IV  Endo: no hx DM - hypoglycemic prior to TPN initiation. CBGs on TPN 127-153 and 80-90 off. Lytes: Na 134.  Slightly low iCa on 3/8 (Ca gluconate 2gm given 3/8). Corr Ca 10 on 3/10. No labs today Renal: SCr stable, CrCL > 100 ml/min - good UOP 0.7 ml/kg/hr, I/O -1L, NS at 10 ml/hr Pulm: extubated 3/2, reintubated 3/6, extubated 3/9 & RA today. Cards: angina - BP soft, sinus tach - Lasix IV BID AC: holding heparin for new left femoral DVT (dx 03/29/15) d/t bleeding, s/p IVC filter placement 3/7. ABLA - hgb 8, plts WNL, INR 1.3 (3/8) Hepatobil: 3/9: AST/ALT mildly elevated, tbili improved to 2.6 (RN noted a little jaundice in sclera on 3/3, resolved now). TG improved to 210 Neuro:  Paresthesias/radicular sx when sitting up, neurosurg seeing.  Pain  control good.  Fentanyl PCA ID: s/p Vanc/Primaxin - afebrile, WBC improving- wnl today  Best Practices: holding heparin gtt, MC TPN Access: right IJ placed 2/17>>2/25, triple lumen PICC 2/25>> TPN start date: 2/21 >>  Current Nutrition:  TPN  Nutritional Goals: 2000-2200 kCal and 130-140 gm protein per day  Plan:  - Continue Cyclic TPN as patient is extubated and off sedation. Clinimix E 5/15, cycle 2636 mLs over 14 hrs: 158ml/hr x 1 hr, then 203 ml/hr x 12 hrs, and 100 ml/hr x 1 hr - Lipids 20% on Tues and Fri to minimize kCal provision - TPN will provide a weekly  average of 2007 kCal per day and 132gm protein per day, meeting 100% of patient's needs - Continue multivitamin and trace elements in TPN - Continue 25 units of insulin in TPN and continue resistant SSI Q4H - TPN labs in AM   Marisue Humble, PharmD Clinical Pharmacist [facility]

## 2015-04-19 NOTE — Progress Notes (Signed)
Patient received from 2S via bariatric bed. Patient with foley catheter draining yellow urine, and a NG to rt nare that was returned per report to LIS with green drainage. Pt with abdominal binder in place and dressing to abd that is CDI. She has 2 JP drains to upper abd with bloody drainage. TPN infusing to Rt PICC. VSS at time of transfer.

## 2015-04-19 NOTE — Progress Notes (Signed)
Subjective: Patient reports Doing fine with regard to pain control she sat up for about hour but started feeling some tingling in her feet since she told the nurse and she got back in bed. No upright film was able to be obtained during that time.  Objective: Vital signs in last 24 hours: Temp:  [98.1 F (36.7 C)-98.9 F (37.2 C)] 98.9 F (37.2 C) (03/12 0819) Pulse Rate:  [100-108] 105 (03/12 0819) Resp:  [19-27] 25 (03/12 0819) BP: (100-149)/(53-73) 130/59 mmHg (03/12 0819) SpO2:  [96 %-100 %] 96 % (03/12 0819) Weight:  [130.636 kg (288 lb)] 130.636 kg (288 lb) (03/11 2110)  Intake/Output from previous day: 03/11 0701 - 03/12 0700 In: 2553 [P.O.:30; NG/GT:90; RUE:4540]TPN:2433] Out: 3457.5 [Urine:2225; Emesis/NG output:1175; Drains:57.5] Intake/Output this shift: Total I/O In: -  Out: 750 [Urine:750]  Strength out of 5 wound clean dry and intact  Lab Results:  Recent Labs  04/17/15 0449  WBC 10.1  HGB 8.0*  HCT 25.7*  PLT 348   BMET  Recent Labs  04/17/15 0449 04/19/15 0432  NA 136 134*  K 4.2 4.1  CL 102 104  CO2 26 24  GLUCOSE 181* 162*  BUN 21* 18  CREATININE 0.63 0.61  CALCIUM 8.2* 8.1*    Studies/Results: No results found.  Assessment/Plan: Continue bed rest due to the development of her paresthesias and radicular symptoms when sitting upright. Dr. Conchita ParisNundkumar to return tomorrow for discussion of plan in my opinion patient will need to remain at bed rest until surgical stabilization.  LOS: 23 days     Elexius Minar P 04/19/2015, 9:41 AM

## 2015-04-20 LAB — COMPREHENSIVE METABOLIC PANEL
ALBUMIN: 1.7 g/dL — AB (ref 3.5–5.0)
ALT: 57 U/L — ABNORMAL HIGH (ref 14–54)
AST: 48 U/L — AB (ref 15–41)
Alkaline Phosphatase: 137 U/L — ABNORMAL HIGH (ref 38–126)
Anion gap: 7 (ref 5–15)
BUN: 19 mg/dL (ref 6–20)
CHLORIDE: 103 mmol/L (ref 101–111)
CO2: 24 mmol/L (ref 22–32)
Calcium: 8.2 mg/dL — ABNORMAL LOW (ref 8.9–10.3)
Creatinine, Ser: 0.61 mg/dL (ref 0.44–1.00)
GFR calc Af Amer: >60 mL/min (ref >60)
GFR calc non Af Amer: >60 mL/min (ref >60)
GLUCOSE: 150 mg/dL — AB (ref 65–99)
POTASSIUM: 4.2 mmol/L (ref 3.5–5.1)
Sodium: 134 mmol/L — ABNORMAL LOW (ref 135–145)
Total Bilirubin: 1.5 mg/dL — ABNORMAL HIGH (ref 0.3–1.2)
Total Protein: 6.4 g/dL — ABNORMAL LOW (ref 6.5–8.1)

## 2015-04-20 LAB — MAGNESIUM: Magnesium: 1.9 mg/dL (ref 1.7–2.4)

## 2015-04-20 LAB — DIFFERENTIAL
BASOS PCT: 0 %
Basophils Absolute: 0 10*3/uL (ref 0.0–0.1)
EOS PCT: 5 %
Eosinophils Absolute: 0.4 10*3/uL (ref 0.0–0.7)
Lymphocytes Relative: 20 %
Lymphs Abs: 1.5 10*3/uL (ref 0.7–4.0)
MONO ABS: 0.8 10*3/uL (ref 0.1–1.0)
Monocytes Relative: 10 %
NEUTROS ABS: 5 10*3/uL (ref 1.7–7.7)
NEUTROS PCT: 65 %

## 2015-04-20 LAB — GLUCOSE, CAPILLARY
GLUCOSE-CAPILLARY: 149 mg/dL — AB (ref 65–99)
GLUCOSE-CAPILLARY: 144 mg/dL — AB (ref 65–99)
GLUCOSE-CAPILLARY: 144 mg/dL — AB (ref 65–99)
GLUCOSE-CAPILLARY: 89 mg/dL (ref 65–99)
GLUCOSE-CAPILLARY: 96 mg/dL (ref 65–99)
Glucose-Capillary: 136 mg/dL — ABNORMAL HIGH (ref 65–99)
Glucose-Capillary: 144 mg/dL — ABNORMAL HIGH (ref 65–99)

## 2015-04-20 LAB — TRIGLYCERIDES: Triglycerides: 88 mg/dL (ref <150)

## 2015-04-20 LAB — PREALBUMIN: PREALBUMIN: 20.1 mg/dL (ref 18–38)

## 2015-04-20 LAB — PHOSPHORUS: Phosphorus: 3.3 mg/dL (ref 2.5–4.6)

## 2015-04-20 MED ORDER — TRACE MINERALS CR-CU-MN-SE-ZN 10-1000-500-60 MCG/ML IV SOLN
INTRAVENOUS | Status: AC
  Administered 2015-04-20: 18:00:00 via INTRAVENOUS
  Filled 2015-04-20 (×2): qty 2636

## 2015-04-20 MED ORDER — BOOST / RESOURCE BREEZE PO LIQD
1.0000 | Freq: Two times a day (BID) | ORAL | Status: DC
  Administered 2015-04-20 – 2015-04-26 (×7): 1 via ORAL
  Filled 2015-04-20 (×10): qty 1

## 2015-04-20 MED ORDER — ZOLPIDEM TARTRATE 5 MG PO TABS
5.0000 mg | ORAL_TABLET | Freq: Every day | ORAL | Status: DC
  Administered 2015-04-20 – 2015-04-27 (×8): 5 mg via ORAL
  Filled 2015-04-20 (×8): qty 1

## 2015-04-20 NOTE — Progress Notes (Signed)
PT Cancellation Note  Patient Details Name: Kristina Estes MRN: [id number] DOB: 06/13/1970   Cancelled Treatment:    Reason Eval/Treat Not Completed: Medical issues which prohibited therapy.  Chart review suggest numbness and tingling in feet when sitting in chair.  Most recent neuro note suggests bedrest until surgical stabilization.  Will hold PT for now until activity orders confirmed.  Encarnacion ChuAshley Abashian PT, DPT  Pager: [phone] Phone: [phone] 04/20/2015, 1:17 PM

## 2015-04-20 NOTE — Progress Notes (Signed)
[facility]  Subjective  - Doing OK tolerated ice chips for last 24 hour   Objective 140/71 108 98.3 F (36.8 C) (Oral) 19 100%  Intake/Output Summary (Last 24 hours) at 04/20/15 0833 Last data filed at 04/20/15 0600  Gross per 24 hour  Intake 4406.07 ml  Output   3105 ml  Net 1301.07 ml    Palpable DP pulses bil. Abdomin soft, abdominal belt in place Left groin wet to dry dressing changed, beefy red base to wound.  Assessment/Planning: S/p x-lap, Repair L CIV, Ligation distal L CIA, L CIA to CFA bypass, s/p multiple abd vac changes x 4 (03/27/15) S/p Abd closure with mesh (04/08/15), S/p Evac of abd hematoma (04/13/15), S/p closure of subcutaneous skin (04/15/15) Wet to dry left groin   Restart anticoagulation once Gen Surgery comfortable with such  IVC filter will need to be removed ideally within 2-3 months  Stable from vascular point of view    Clinton GallantCOLLINS, EMMA [facility] 04/20/2015 8:33 AM --  Laboratory Lab Results: No results for input(s): WBC, HGB, HCT, PLT in the last 72 hours. BMET  Recent Labs  04/19/15 0432 04/20/15 0600  NA 134* 134*  K 4.1 4.2  CL 104 103  CO2 24 24  GLUCOSE 162* 150*  BUN 18 19  CREATININE 0.61 0.61  CALCIUM 8.1* 8.2*    COAG Lab Results  Component Value Date   INR 1.30 04/15/2015   INR 1.34 04/13/2015   INR 1.20 04/02/2015   No results found for: PTT

## 2015-04-20 NOTE — Progress Notes (Signed)
5 Days Post-Op  Subjective: No complaints Minimal abdominal pain Had a BM this morning JP drain with serous output  Objective: Vital signs in last 24 hours: Temp:  [98 F (36.7 C)-99.3 F (37.4 C)] 98 F (36.7 C) (03/13 0830) Pulse Rate:  [103-110] 103 (03/13 0830) Resp:  [19-30] 26 (03/13 0830) BP: (104-140)/(42-73) 129/71 mmHg (03/13 0830) SpO2:  [97 %-100 %] 97 % (03/13 0830) Last BM Date: 04/20/15  Intake/Output from previous day: 03/12 0701 - 03/13 0700 In: 4406.1 [P.O.:30; I.V.:100; TPN:4276.1] Out: 3105 [Urine:3000; Drains:105] Intake/Output this shift: Total I/O In: -  Out: 850 [Urine:850]  General appearance: alert, cooperative and no distress GI: soft, non-tender; + BS Incision - staple line intact; drain site OK  Lab Results:  No results for input(s): WBC, HGB, HCT, PLT in the last 72 hours. BMET  Recent Labs  04/19/15 0432 04/20/15 0600  NA 134* 134*  K 4.1 4.2  CL 104 103  CO2 24 24  GLUCOSE 162* 150*  BUN 18 19  CREATININE 0.61 0.61  CALCIUM 8.1* 8.2*   PT/INR No results for input(s): LABPROT, INR in the last 72 hours. ABG No results for input(s): PHART, HCO3 in the last 72 hours.  Invalid input(s): PCO2, PO2  Studies/Results: No results found.  Anti-infectives: Anti-infectives    Start     Dose/Rate Route Frequency Ordered Stop   04/15/15 0930  polymyxin B 500,000 Units, bacitracin 50,000 Units in sodium chloride irrigation 0.9 % 500 mL irrigation  Status:  Discontinued       As needed 04/15/15 0930 04/15/15 0959   04/13/15 2230  vancomycin (VANCOCIN) IVPB 1000 mg/200 mL premix  Status:  Discontinued     1,000 mg 200 mL/hr over 60 Minutes Intravenous Every 12 hours 04/13/15 0948 04/16/15 1007   04/13/15 1200  imipenem-cilastatin (PRIMAXIN) 500 mg in sodium chloride 0.9 % 100 mL IVPB  Status:  Discontinued     500 mg 200 mL/hr over 30 Minutes Intravenous 4 times per day 04/13/15 0946 04/16/15 1007   04/13/15 1000  ceFAZolin (ANCEF)  IVPB 2 g/50 mL premix  Status:  Discontinued     2 g 100 mL/hr over 30 Minutes Intravenous 3 times per day 04/13/15 0901 04/13/15 0936   04/13/15 1000  vancomycin (VANCOCIN) 2,500 mg in sodium chloride 0.9 % 500 mL IVPB     2,500 mg 250 mL/hr over 120 Minutes Intravenous  Once 04/13/15 0948 04/13/15 1405   04/02/15 2330  vancomycin (VANCOCIN) IVPB 1000 mg/200 mL premix  Status:  Discontinued     1,000 mg 200 mL/hr over 60 Minutes Intravenous Every 12 hours 04/02/15 1015 04/05/15 0849   04/02/15 1830  piperacillin-tazobactam (ZOSYN) IVPB 3.375 g  Status:  Discontinued     3.375 g 12.5 mL/hr over 240 Minutes Intravenous 3 times per day 04/02/15 1015 04/09/15 0804   04/02/15 1100  cefUROXime (ZINACEF) 1.5 g in dextrose 5 % 50 mL IVPB  Status:  Discontinued     1.5 g 100 mL/hr over 30 Minutes Intravenous To ShortStay Surgical 04/01/15 1106 04/02/15 1448   04/02/15 1100  vancomycin (VANCOCIN) 2,500 mg in sodium chloride 0.9 % 500 mL IVPB     2,500 mg 250 mL/hr over 120 Minutes Intravenous  Once 04/02/15 1029 04/02/15 1342   04/02/15 1030  vancomycin (VANCOCIN) 2,250 mg in sodium chloride 0.9 % 500 mL IVPB  Status:  Discontinued     2,250 mg 250 mL/hr over 120 Minutes Intravenous  Once 04/02/15  1015 04/02/15 1029   04/02/15 1030  piperacillin-tazobactam (ZOSYN) IVPB 3.375 g     3.375 g 100 mL/hr over 30 Minutes Intravenous  Once 04/02/15 1015 04/02/15 1212   03/27/15 2130  cefUROXime (ZINACEF) 1.5 g in dextrose 5 % 50 mL IVPB  Status:  Discontinued     1.5 g 100 mL/hr over 30 Minutes Intravenous Every 12 hours 03/27/15 2124 03/27/15 2132   03/27/15 1600  ceFAZolin (ANCEF) IVPB 2 g/50 mL premix     2 g 100 mL/hr over 30 Minutes Intravenous Every 8 hours 03/27/15 1322 03/28/15 0759   03/27/15 1447  ceFAZolin (ANCEF) 1-5 GM-% IVPB    Comments:  Merril AbbeButler, David   : cabinet override      03/27/15 1447 03/28/15 0259   03/27/15 0800  bacitracin 50,000 Units in sodium chloride irrigation 0.9 % 500  mL irrigation  Status:  Discontinued       As needed 03/27/15 0943 03/27/15 1223   03/27/15 0700  ceFAZolin (ANCEF) 3 g in dextrose 5 % 50 mL IVPB     3 g 130 mL/hr over 30 Minutes Intravenous To ShortStay Surgical 03/26/15 1409 03/27/15 0750      Assessment/Plan: s/p Procedure(s): Exploration of abdominal wound (N/A) CLOSURE OF ABDOMINAL WOUND (Bilateral) Advance diet to clear liquids Foley per primary team. Transfer to floor per primary team.  No need to stay in SDU for general surgical reasons.   LOS: 24 days    Zephyr Sausedo K. 04/20/2015

## 2015-04-20 NOTE — Progress Notes (Signed)
No issues overnight. Pt wants to get out of bed, but did start to get numbness and tingling in the legs when she tried to sit up over the weekend. She is tolerating clears without N/V.  EXAM:  BP 129/77 mmHg  Pulse 103  Temp(Src) 97.9 F (36.6 C) (Oral)  Resp 31  Ht 5\' 5"  (1.651 m)  Wt 130.636 kg (288 lb)  BMI 47.93 kg/m2  SpO2 100%  LMP 03/27/2015  Awake, alert, oriented  Speech fluent, appropriate  CN grossly intact  5/5 BUE/BLE   IMPRESSION:  45 y.o. female s/p iliac repair, abd closure, and IVC placement. Will need stabilization of L5-S1  PLAN: - Plan on lumbar instrumentation Tues or Wed if ok with gen surg - Will add ambien QHS

## 2015-04-20 NOTE — Progress Notes (Signed)
Nutrition Follow-up  DOCUMENTATION CODES:   Morbid obesity  INTERVENTION:    Continue TPN dosing per Pharmacy.  Add Boost Breeze PO BID, each supplement provides 250 kcal and 9 grams of protein.  NUTRITION DIAGNOSIS:   Inadequate oral intake related to inability to eat as evidenced by NPO status.  Ongoing  GOAL:   Patient will meet greater than or equal to 90% of their needs  Met  MONITOR:   Vent status, Labs, Weight trends, Skin, I & O's  ASSESSMENT:   45 year old with history of asthma, arthritis, obesity. She had an elective admission today for L5 laminectomy. Intra-Op she went into shock, hemodynamic instability. Subsequent CT showed large left retroperitoneal hematoma. She was put on 2 pressors (norepi, epinephrine) and transferred to ICU. PCCM consulted for help with management. Vascular surgery was also consulted. Intra OP TEE showed normal cardiac function.  Patient was extubated on 3/9. Patient just started on clear liquid diet today, tolerating small amounts well so far.   Patient is receiving Cyclic OIZ:TIWPYKDX E 8/33, cycle 2636 mLs over 14 hrs: 131m/hr x 1 hr, then 203 ml/hr x 12 hrs, and 100 ml/hr x 1 hr. 20% intralipids at 10 ml/h on Tuesdays and Fridays. TPN will provide a weekly average of 2007 kcal and 132gm protein per day, meeting 100% of patient's needs  Diet Order:  .TPN (CLINIMIX-E) Adult Diet clear liquid Room service appropriate?: Yes; Fluid consistency:: Thin .TPN (CLINIMIX-E) Adult  Skin:  Wound (see comment) (abdominal wound VAC)  Last BM:  3/13  Height:   Ht Readings from Last 1 Encounters:  04/18/15 _0  (1.651 m)    Weight:   Wt Readings from Last 1 Encounters:  04/18/15 288 lb (130.636 kg)    Ideal Body Weight:  52.27 kg (kg)  BMI:  Body mass index is 47.93 kg/(m^2).  Estimated Nutritional Needs:   Kcal:  2100-2300  Protein:  130-140 gm  Fluid:  2.1-2.3 L  EDUCATION NEEDS:   No education needs identified at  this time  KMolli Barrows RAntares LKearney CMayaguezPager [phone] Hours Pager [phone]

## 2015-04-20 NOTE — Progress Notes (Signed)
PARENTERAL NUTRITION CONSULT NOTE - FOLLOW UP  Pharmacy Consult for TPN Indication: Open abdomen, s/p closure  Allergies  Allergen Reactions  . Propoxyphene Other (See Comments)    Unspecified    Patient Measurements: Height:  (165.1 cm) Weight: 288 lb (130.636 kg) IBW/kg (Calculated) : 57  Vital Signs: Temp: 98 F (36.7 C) (03/13 0830) Temp Source: Oral (03/13 0830) BP: 140/71 mmHg (03/13 0255) Pulse Rate: 108 (03/13 0255) Intake/Output from previous day: 03/12 0701 - 03/13 0700 In: 4406.1 [P.O.:30; I.V.:100; TPN:4276.1] Out: 3105 [Urine:3000; Drains:105] Intake/Output from this shift: Total I/O In: -  Out: 850 [Urine:850]  Labs: No results for input(s): WBC, HGB, HCT, PLT, APTT, INR in the last 72 hours.   Recent Labs  04/19/15 0432 04/20/15 0355 04/20/15 0600  NA 134*  --  134*  K 4.1  --  4.2  CL 104  --  103  CO2 24  --  24  GLUCOSE 162*  --  150*  BUN 18  --  19  CREATININE 0.61  --  0.61  CALCIUM 8.1*  --  8.2*  MG  --   --  1.9  PHOS  --   --  3.3  PROT  --   --  6.4*  ALBUMIN  --   --  1.7*  AST  --   --  48*  ALT  --   --  57*  ALKPHOS  --   --  137*  BILITOT  --   --  1.5*  PREALBUMIN  --  20.1  --   TRIG  --   --  88   Estimated Creatinine Clearance: 121.1 mL/min (by C-G formula based on Cr of 0.61).    Recent Labs  04/19/15 2306 04/20/15 0300 04/20/15 0830  GLUCAP 149* 144* 144*    Medications:  Scheduled:  . antiseptic oral rinse  7 mL Mouth Rinse q12n4p  . antiseptic oral rinse  7 mL Mouth Rinse QID  . chlorhexidine  15 mL Mouth Rinse BID  . chlorhexidine gluconate  15 mL Mouth Rinse BID  . insulin aspart  0-20 Units Subcutaneous 6 times per day  . metoCLOPramide (REGLAN) injection  10 mg Intravenous 4 times per day  . pantoprazole (PROTONIX) IV  40 mg Intravenous Q24H  . sodium chloride flush  10-40 mL Intracatheter Q12H    Insulin Requirements in the past 24 hours:  21 units resistant SSI in the last 24h (on  cyclic TPN), 11 units since last bag hung + 25 units of regular insulin in TPN  Assessment: 44 YOF admitted on 03/27/15 with back and leg pain and underwent L5 laminectomy with facetectomy later that day. Intraoperatively patient went into shock and CT revealed large left RP hematoma. Patient returned to the OR for ex-lap with repair of left iliac vein, ligation of distal left CIA,  left CIA-CFA bypass, and placement of abdominal VAC.  GI: hx GERD / obesity. Baseline prealbumin low at 8.4 >> 14.1>20.1 (frequent OR visits for abd washout). s/p abdominal wall closure with mesh on 3/1. Found to have a large abd wall hematoma 3/6, s/p evacuation 3/8. Emesis/NG O/P none/24h, drains . (+)flatus, (-)BM.  PPI IV, Reglan IV. Started CL diet. Tolerating ice chips  Endo: no hx DM - hypoglycemic prior to TPN initiation. CBGs<150  Lytes: Na 134.  Slightly low iCa on 3/8 (Ca gluconate 2gm given 3/8).Ca 8.2 corrects to 10  Renal: SCr stable, CrCL > 100 ml/min - good UOP  1 ml/kg/hr, I/O +1.3  Pulm: extubated 3/2, reintubated 3/6, extubated 3/9. Currently RA  Cards: angina - BP variable. HR 108. No CV meds.  AC: holding heparin for new left femoral DVT (dx 03/29/15) d/t bleeding, s/p IVC filter placement 3/7. ABLA - hgb 8, plts WNL, INR 1.3 (3/8)  Hepatobil: 3/9: AST/ALT mildly elevated, tbili improved to 2.6>1.5,  (RN noted a little jaundice in sclera on 3/3, resolved now). TG improved to 210>88  Neuro:  Paresthesias/radicular sx when sitting up, neurosurg seeing.    ID: Tmax 99.3.   Best Practices: holding heparin gtt, MC  TPN Access: right IJ placed 2/17>>2/25, triple lumen PICC 2/25>>  TPN start date: 2/21 >>  Current Nutrition:  TPN  Nutritional Goals: 2000-2200 kCal and 130-140 gm protein per day  Plan:  -Cyclic TPN: Clinimix E 5/15, cycle 2636 mLs over 14 hrs: 17000ml/hr x 1 hr, then 203 ml/hr x 12 hrs, and 100 ml/hr x 1 hr (TPN will provide a weekly average of 2007 kCal  per day and 132gm protein per day, meeting 100% of patient's needs) - Lipids 20% on Tues and Fri to minimize kCal provision - Continue multivitamin and trace elements in TPN - Continue 25 units of insulin in TPN and continue resistant SSI Q4H - TPN labs in AM   Tallie Hevia S. Merilynn Finlandobertson, PharmD, BCPS Clinical Staff Pharmacist Pager [phone]

## 2015-04-21 LAB — GLUCOSE, CAPILLARY
GLUCOSE-CAPILLARY: 148 mg/dL — AB (ref 65–99)
GLUCOSE-CAPILLARY: 142 mg/dL — AB (ref 65–99)
Glucose-Capillary: 101 mg/dL — ABNORMAL HIGH (ref 65–99)
Glucose-Capillary: 110 mg/dL — ABNORMAL HIGH (ref 65–99)
Glucose-Capillary: 132 mg/dL — ABNORMAL HIGH (ref 65–99)
Glucose-Capillary: 137 mg/dL — ABNORMAL HIGH (ref 65–99)

## 2015-04-21 MED ORDER — TRACE MINERALS CR-CU-MN-SE-ZN 10-1000-500-60 MCG/ML IV SOLN
INTRAVENOUS | Status: AC
  Administered 2015-04-21: 18:00:00 via INTRAVENOUS
  Filled 2015-04-21 (×2): qty 2636

## 2015-04-21 MED ORDER — OXYCODONE-ACETAMINOPHEN 5-325 MG PO TABS
1.0000 | ORAL_TABLET | ORAL | Status: DC | PRN
  Administered 2015-04-21 – 2015-04-28 (×18): 2 via ORAL
  Filled 2015-04-21 (×18): qty 2

## 2015-04-21 MED ORDER — FAT EMULSION 20 % IV EMUL
240.0000 mL | INTRAVENOUS | Status: AC
  Administered 2015-04-21: 240 mL via INTRAVENOUS
  Filled 2015-04-21 (×2): qty 250

## 2015-04-21 MED ORDER — METHOCARBAMOL 750 MG PO TABS
750.0000 mg | ORAL_TABLET | Freq: Four times a day (QID) | ORAL | Status: DC | PRN
  Administered 2015-04-21 – 2015-04-28 (×14): 750 mg via ORAL
  Filled 2015-04-21 (×2): qty 2
  Filled 2015-04-21: qty 1
  Filled 2015-04-21: qty 2
  Filled 2015-04-21 (×2): qty 1
  Filled 2015-04-21: qty 2
  Filled 2015-04-21: qty 1
  Filled 2015-04-21 (×3): qty 2
  Filled 2015-04-21: qty 1
  Filled 2015-04-21: qty 2
  Filled 2015-04-21: qty 1

## 2015-04-21 NOTE — Progress Notes (Addendum)
PARENTERAL NUTRITION CONSULT NOTE - FOLLOW UP  Pharmacy Consult for TPN Indication: Open abdomen, s/p closure  Allergies  Allergen Reactions  . Propoxyphene Other (See Comments)    Unspecified    Patient Measurements: Height:  (165.1 cm) Weight: 288 lb (130.636 kg) IBW/kg (Calculated) : 57  Vital Signs: Temp: 99.2 F (37.3 C) (03/14 0700) Temp Source: Oral (03/14 0700) BP: 112/60 mmHg (03/14 0315) Pulse Rate: 119 (03/14 0315) Intake/Output from previous day: 03/13 0701 - 03/14 0700 In: 2630 [TPN:2630] Out: 3075 [Urine:3050; Drains:25] Intake/Output from this shift: Total I/O In: -  Out: 850 [Urine:800; Drains:50]  Labs: No results for input(s): WBC, HGB, HCT, PLT, APTT, INR in the last 72 hours.   Recent Labs  04/19/15 0432 04/20/15 0355 04/20/15 0600  NA 134*  --  134*  K 4.1  --  4.2  CL 104  --  103  CO2 24  --  24  GLUCOSE 162*  --  150*  BUN 18  --  19  CREATININE 0.61  --  0.61  CALCIUM 8.1*  --  8.2*  MG  --   --  1.9  PHOS  --   --  3.3  PROT  --   --  6.4*  ALBUMIN  --   --  1.7*  AST  --   --  48*  ALT  --   --  57*  ALKPHOS  --   --  137*  BILITOT  --   --  1.5*  PREALBUMIN  --  20.1  --   TRIG  --   --  88   Estimated Creatinine Clearance: 121.1 mL/min (by C-G formula based on Cr of 0.61).    Recent Labs  04/20/15 2342 04/21/15 0313 04/21/15 0733  GLUCAP 144* 148* 142*    Medications:  Scheduled:  . antiseptic oral rinse  7 mL Mouth Rinse q12n4p  . antiseptic oral rinse  7 mL Mouth Rinse QID  . chlorhexidine  15 mL Mouth Rinse BID  . feeding supplement  1 Container Oral BID BM  . insulin aspart  0-20 Units Subcutaneous 6 times per day  . metoCLOPramide (REGLAN) injection  10 mg Intravenous 4 times per day  . pantoprazole (PROTONIX) IV  40 mg Intravenous Q24H  . sodium chloride flush  10-40 mL Intracatheter Q12H  . zolpidem  5 mg Oral QHS    Insulin Requirements in the past 24 hours:  11 units resistant SSI in the  last 24h (on cyclic TPN) + 25 units of regular insulin in TPN  Assessment: 44 YOF admitted on 03/27/15 with back and leg pain and underwent L5 laminectomy with facetectomy later that day. Intraoperatively patient went into shock and CT revealed large left RP hematoma. Patient returned to the OR for ex-lap with repair of left iliac vein, ligation of distal left CIA,  left CIA-CFA bypass, and placement of abdominal VAC.  GI: hx GERD / obesity. Baseline prealbumin low at 8.4 >> 14.1>20.1 (frequent OR visits for abd washout). s/p abdominal wall closure with mesh on 3/1. Found to have a large abd wall hematoma 3/6, s/p evacuation 3/8. Emesis/NG O/P none/24h, drains . (+)flatus, (+)BM 3/13.  PPI IV, Reglan IV. CLD  Endo: no hx DM - hypoglycemic prior to TPN initiation. CBGs<150  Lytes: Na 134.CoCa 10  Renal: SCr stable, CrCL > 100 ml/min - good UOP 1 ml/kg/hr, I/O +1.3  Pulm: extubated 3/2, reintubated 3/6, extubated 3/9. Currently RA  Cards: angina -  BP variable. HR 108. No CV meds.  AC: holding heparin for new left femoral DVT (dx 03/29/15) d/t bleeding, s/p IVC filter placement 3/7. ABLA - hgb 8, plts WNL, INR 1.3 (3/8)  Hepatobil: 3/9: AST/ALT mildly elevated, tbili improved to 2.6>1.5, Jaundice in sclera on 3/3, resolved now. TG improved to 210>88  Neuro:  Paresthesias/radicular sx when sitting up, neurosurg seeing.    ID: Tmax 99.3.   Best Practices: holding heparin gtt, MC  TPN Access: right IJ placed 2/17>>2/25, triple lumen PICC 2/25>>  TPN start date: 2/21 >>  Current Nutrition:  TPN+ Boost breeze + CLD  Nutritional Goals: 2100-2300 kCal and 130-140 gm protein per day Per RD 3/13  Plan:  -Cyclic TPN: Clinimix E 5/15, cycle 2636 mLs over 14 hrs: 16900ml/hr x 1 hr, then 203 ml/hr x 12 hrs, and 100 ml/hr x 1 hr  -TPN will provide a weekly average of 2007 kCal per day and 132gm protein per day, with Boost, pt will meet 100% of nutrition goals - Lipids 20% on  Tues and Fri to minimize kCal provision - Continue multivitamin and trace elements in TPN - Reduce insulin in TPN to 20 units, continue resistant SSI Q4H -F/u CLD + weaning TPN    Agapito GamesAlison Angalena Cousineau, PharmD, BCPS Clinical Pharmacist 04/21/2015 9:07 AM

## 2015-04-21 NOTE — Progress Notes (Signed)
Patient ID: Kristina Estes, female   DOB: 12-23-1970, 45 y.o.   MRN: [id number]     [facility]      [address] [place], [place] [address]    Phone: [phone] FAX: [phone]     Subjective: Kristina Estes anxious to get moving around.  D/w Dr. Kathyrn Sheriff, will need surgery when okay from our standpoint. Until then, log roll only. Tolerating clears.  Having BMs. No n/v.  JP drain ruq 79m luq 276m Objective:  Vital signs:  Filed Vitals:   04/20/15 2340 04/21/15 0310 04/21/15 0315 04/21/15 0700  BP: 143/77  112/60   Pulse: 110  119   Temp: 98.1 F (36.7 C) 99.2 F (37.3 C)  99.2 F (37.3 C)  TempSrc: Oral Oral  Oral  Resp: 19     Height:      Weight:      SpO2: 99%  100%     Last BM Date: 04/20/15  Intake/Output   Yesterday:  03/13 0701 - 03/14 0700 In: 2630 [TPN:2630] Out: 3075 [Urine:3050; Drains:25] This shift:  Total I/O In: -  Out: 850 [Urine:800; Drains:50]   Physical Exam: General: Kristina Estes awake/alert/oriented x4 in no acute distress  Abdomen: Soft.  Nondistended.   Mildly tender at incisions only. Dressing removed from midline incision, staples in place, no erythema.  ruq and luq drains with sanguinous output.  No evidence of peritonitis.  No incarcerated hernias.    Problem List:   Active Problems:   Lumbar spondylolysis   Acute respiratory failure with hypoxia (HCC)   Hemorrhagic shock   Hypervolemia   Acute respiratory failure with hypercapnia (HCC)   Fever, unspecified   Pressure ulcer   Hematoma of abdominal wall   Acute respiratory failure (HCC)   Atelectasis   Encounter for imaging study to confirm nasogastric (NG) tube placement   Abdominal swelling   Spinal stenosis    Results:   Labs: Results for orders placed or performed during the hospital encounter of 03/27/15 (from the past 48 hour(s))  Glucose, capillary     Status: Abnormal   Collection Time: 04/19/15 12:07 PM  Result Value Ref  Range   Glucose-Capillary 126 (H) 65 - 99 mg/dL  Glucose, capillary     Status: Abnormal   Collection Time: 04/19/15  4:15 PM  Result Value Ref Range   Glucose-Capillary 106 (H) 65 - 99 mg/dL  Glucose, capillary     Status: Abnormal   Collection Time: 04/19/15  7:56 PM  Result Value Ref Range   Glucose-Capillary 141 (H) 65 - 99 mg/dL  Glucose, capillary     Status: Abnormal   Collection Time: 04/19/15 11:06 PM  Result Value Ref Range   Glucose-Capillary 149 (H) 65 - 99 mg/dL  Glucose, capillary     Status: Abnormal   Collection Time: 04/20/15  3:00 AM  Result Value Ref Range   Glucose-Capillary 144 (H) 65 - 99 mg/dL  Differential     Status: None   Collection Time: 04/20/15  3:55 AM  Result Value Ref Range   Neutrophils Relative % 65 %   Neutro Abs 5.0 1.7 - 7.7 K/uL   Lymphocytes Relative 20 %   Lymphs Abs 1.5 0.7 - 4.0 K/uL   Monocytes Relative 10 %   Monocytes Absolute 0.8 0.1 - 1.0 K/uL   Eosinophils Relative 5 %   Eosinophils Absolute 0.4 0.0 - 0.7 K/uL   Basophils Relative 0 %   Basophils Absolute  0.0 0.0 - 0.1 K/uL  Prealbumin     Status: None   Collection Time: 04/20/15  3:55 AM  Result Value Ref Range   Prealbumin 20.1 18 - 38 mg/dL  Comprehensive metabolic panel     Status: Abnormal   Collection Time: 04/20/15  6:00 AM  Result Value Ref Range   Sodium 134 (L) 135 - 145 mmol/L   Potassium 4.2 3.5 - 5.1 mmol/L   Chloride 103 101 - 111 mmol/L   CO2 24 22 - 32 mmol/L   Glucose, Bld 150 (H) 65 - 99 mg/dL   BUN 19 6 - 20 mg/dL   Creatinine, Ser 0.61 0.44 - 1.00 mg/dL   Calcium 8.2 (L) 8.9 - 10.3 mg/dL   Total Protein 6.4 (L) 6.5 - 8.1 g/dL   Albumin 1.7 (L) 3.5 - 5.0 g/dL   AST 48 (H) 15 - 41 U/L   ALT 57 (H) 14 - 54 U/L   Alkaline Phosphatase 137 (H) 38 - 126 U/L   Total Bilirubin 1.5 (H) 0.3 - 1.2 mg/dL   GFR calc non Af Amer >60 >60 mL/min   GFR calc Af Amer >60 >60 mL/min    Comment: (NOTE) The eGFR has been calculated using the CKD EPI equation. This  calculation has not been validated in all clinical situations. eGFR's persistently <60 mL/min signify possible Chronic Kidney Disease.    Anion gap 7 5 - 15  Magnesium     Status: None   Collection Time: 04/20/15  6:00 AM  Result Value Ref Range   Magnesium 1.9 1.7 - 2.4 mg/dL  Phosphorus     Status: None   Collection Time: 04/20/15  6:00 AM  Result Value Ref Range   Phosphorus 3.3 2.5 - 4.6 mg/dL  Triglycerides     Status: None   Collection Time: 04/20/15  6:00 AM  Result Value Ref Range   Triglycerides 88 <150 mg/dL  Glucose, capillary     Status: Abnormal   Collection Time: 04/20/15  8:30 AM  Result Value Ref Range   Glucose-Capillary 144 (H) 65 - 99 mg/dL  Glucose, capillary     Status: None   Collection Time: 04/20/15 12:55 PM  Result Value Ref Range   Glucose-Capillary 89 65 - 99 mg/dL   Comment 1 Notify RN    Comment 2 Document in Chart   Glucose, capillary     Status: None   Collection Time: 04/20/15  4:32 PM  Result Value Ref Range   Glucose-Capillary 96 65 - 99 mg/dL  Glucose, capillary     Status: Abnormal   Collection Time: 04/20/15  7:58 PM  Result Value Ref Range   Glucose-Capillary 136 (H) 65 - 99 mg/dL  Glucose, capillary     Status: Abnormal   Collection Time: 04/20/15 11:42 PM  Result Value Ref Range   Glucose-Capillary 144 (H) 65 - 99 mg/dL  Glucose, capillary     Status: Abnormal   Collection Time: 04/21/15  3:13 AM  Result Value Ref Range   Glucose-Capillary 148 (H) 65 - 99 mg/dL  Glucose, capillary     Status: Abnormal   Collection Time: 04/21/15  7:33 AM  Result Value Ref Range   Glucose-Capillary 142 (H) 65 - 99 mg/dL   Comment 1 Notify RN    Comment 2 Document in Chart     Imaging / Studies: No results found.  Medications / Allergies:  Scheduled Meds: . antiseptic oral rinse  7 mL Mouth Rinse q12n4p  .  antiseptic oral rinse  7 mL Mouth Rinse QID  . chlorhexidine  15 mL Mouth Rinse BID  . feeding supplement  1 Container Oral BID BM   . insulin aspart  0-20 Units Subcutaneous 6 times per day  . metoCLOPramide (REGLAN) injection  10 mg Intravenous 4 times per day  . pantoprazole (PROTONIX) IV  40 mg Intravenous Q24H  . sodium chloride flush  10-40 mL Intracatheter Q12H  . zolpidem  5 mg Oral QHS   Continuous Infusions: . Marland KitchenTPN (CLINIMIX-E) Adult Stopped (04/21/15 4034)  . sodium chloride 10 mL/hr at 04/13/15 0600   PRN Meds:.fentaNYL (SUBLIMAZE) injection, ondansetron (ZOFRAN) IV, sodium chloride flush  Antibiotics: Anti-infectives    Start     Dose/Rate Route Frequency Ordered Stop   04/15/15 0930  polymyxin B 500,000 Units, bacitracin 50,000 Units in sodium chloride irrigation 0.9 % 500 mL irrigation  Status:  Discontinued       As needed 04/15/15 0930 04/15/15 0959   04/13/15 2230  vancomycin (VANCOCIN) IVPB 1000 mg/200 mL premix  Status:  Discontinued     1,000 mg 200 mL/hr over 60 Minutes Intravenous Every 12 hours 04/13/15 0948 04/16/15 1007   04/13/15 1200  imipenem-cilastatin (PRIMAXIN) 500 mg in sodium chloride 0.9 % 100 mL IVPB  Status:  Discontinued     500 mg 200 mL/hr over 30 Minutes Intravenous 4 times per day 04/13/15 0946 04/16/15 1007   04/13/15 1000  ceFAZolin (ANCEF) IVPB 2 g/50 mL premix  Status:  Discontinued     2 g 100 mL/hr over 30 Minutes Intravenous 3 times per day 04/13/15 0901 04/13/15 0936   04/13/15 1000  vancomycin (VANCOCIN) 2,500 mg in sodium chloride 0.9 % 500 mL IVPB     2,500 mg 250 mL/hr over 120 Minutes Intravenous  Once 04/13/15 0948 04/13/15 1405   04/02/15 2330  vancomycin (VANCOCIN) IVPB 1000 mg/200 mL premix  Status:  Discontinued     1,000 mg 200 mL/hr over 60 Minutes Intravenous Every 12 hours 04/02/15 1015 04/05/15 0849   04/02/15 1830  piperacillin-tazobactam (ZOSYN) IVPB 3.375 g  Status:  Discontinued     3.375 g 12.5 mL/hr over 240 Minutes Intravenous 3 times per day 04/02/15 1015 04/09/15 0804   04/02/15 1100  cefUROXime (ZINACEF) 1.5 g in dextrose 5 % 50 mL IVPB   Status:  Discontinued     1.5 g 100 mL/hr over 30 Minutes Intravenous To ShortStay Surgical 04/01/15 1106 04/02/15 1448   04/02/15 1100  vancomycin (VANCOCIN) 2,500 mg in sodium chloride 0.9 % 500 mL IVPB     2,500 mg 250 mL/hr over 120 Minutes Intravenous  Once 04/02/15 1029 04/02/15 1342   04/02/15 1030  vancomycin (VANCOCIN) 2,250 mg in sodium chloride 0.9 % 500 mL IVPB  Status:  Discontinued     2,250 mg 250 mL/hr over 120 Minutes Intravenous  Once 04/02/15 1015 04/02/15 1029   04/02/15 1030  piperacillin-tazobactam (ZOSYN) IVPB 3.375 g     3.375 g 100 mL/hr over 30 Minutes Intravenous  Once 04/02/15 1015 04/02/15 1212   03/27/15 2130  cefUROXime (ZINACEF) 1.5 g in dextrose 5 % 50 mL IVPB  Status:  Discontinued     1.5 g 100 mL/hr over 30 Minutes Intravenous Every 12 hours 03/27/15 2124 03/27/15 2132   03/27/15 1600  ceFAZolin (ANCEF) IVPB 2 g/50 mL premix     2 g 100 mL/hr over 30 Minutes Intravenous Every 8 hours 03/27/15 1322 03/28/15 0759   03/27/15 1447  ceFAZolin (ANCEF) 1-5 GM-% IVPB    Comments:  Claybon Jabs   : cabinet override      03/27/15 1447 03/28/15 0259   03/27/15 0800  bacitracin 50,000 Units in sodium chloride irrigation 0.9 % 500 mL irrigation  Status:  Discontinued       As needed 03/27/15 0943 03/27/15 1223   03/27/15 0700  ceFAZolin (ANCEF) 3 g in dextrose 5 % 50 mL IVPB     3 g 130 mL/hr over 30 Minutes Intravenous To ShortStay Surgical 03/26/15 1409 03/27/15 0750        Assessment/Plan S/p L5 laminectomy (2/17/7 - Dr. Kathyrn Sheriff)  Left iliac vein and left common iliac artery bleeding  S/p Ex lap, repair of left iliac vein, ligation of distal left common iliac artery, left common iliac artery to left common femoral bypass with 58m Dacron, placement of abdominal negative pressure dressing (03/27/15 - Early and CBridgett Larsson  Subsequently 4 abdominal washouts and wound vac changes 2/19, 2/21, 2/23, 2/27 (Bridgett Larsson s/p exploratory laparotomy, abdominal wound  closure with Phasix mesh, drain placement(Dr. RRosendo Gros  Post-operative abdominal wall hematoma s/p Exploration of abdominal wound, evacuation of hematoma, placement of negative pressure dressing ([place])(04/13/2015)  POD#6 wound exploration, evacuation of residual hematoma, closure of sking and subq tissue(Dr. IDalbert Batman -advance diet -drains can probably be removed soon -add PO pain meds, add robaxin for muscle spasms  -the patient is stable for lumbar instrumentation at any time from a general surgical standpoint.   PCM-TPN until PO intake is adequate.  Prealbumin 20(3/13) ABLA-stable  Left common iliac DVT-s/p IVC filter  Disp - can transfer out from a surgical standpoint   EErby Pian ASan Antonio Endoscopy CenterSurgery Pager [phone](7A-4:30P) For consults and floor pages call [phone](7A-4:30P)  04/21/2015 9:01 AM

## 2015-04-21 NOTE — Progress Notes (Signed)
PT Cancellation Note  Patient Details Name: Kristina Estes MRN: [id number] DOB: 10/25/1970   Cancelled Treatment:    Reason Eval/Treat Not Completed: Medical issues which prohibited therapy.  MD requests pt to be on bedrest until surgical stabilization completed.  Surgery likely either today or tomorrow.  PT will continue to follow acutely.  Encarnacion ChuAshley Abashian PT, DPT  Pager: [phone] Phone: [phone] 04/21/2015, 9:27 AM

## 2015-04-21 NOTE — Care Management Note (Signed)
Case Management Note  Patient Details  Name: Kristina Estes MRN: [id number] Date of Birth: 08/06/1970  Subjective/Objective:     Patient s/p laminectomy with facetectomy,  With large left retroperitoneal hematoma, s/p evacuation on 3/8, wound  Vac, jp drain in place.  TPN started, per neurosurgery plan for lumbar instrumentation 3/14 or 3/15.  NCM will continue to follow for dc needs. ( pt eval rec Cir ,- will follow after surgery).               Action/Plan:   Expected Discharge Date:                  Expected Discharge Plan:  Skilled Nursing Facility  In-House Referral:  Clinical Social Work  Discharge planning Services  CM Consult  Post Acute Care Choice:    Choice offered to:     DME Arranged:    DME Agency:     HH Arranged:    HH Agency:     Status of Service:  In process, will continue to follow  Medicare Important Message Given:    Date Medicare IM Given:    Medicare IM give by:    Date Additional Medicare IM Given:    Additional Medicare Important Message give by:     If discussed at Long Length of Stay Meetings, dates discussed:    Additional Comments:  Kristina Estes, Kristina Stary Clinton, RN 04/21/2015, 6:07 PM

## 2015-04-21 NOTE — Progress Notes (Signed)
No issues overnight. Pts only c/o some back spasms. Otherwise ok. No N/V with soft diet. No new N/T/W.  EXAM:  BP 123/67 mmHg  Pulse 106  Temp(Src) 98.9 F (37.2 C) (Oral)  Resp 25  Ht 5\' 5"  (1.651 m)  Wt 130.636 kg (288 lb)  BMI 47.93 kg/m2  SpO2 100%  LMP 03/27/2015  Awake, alert, oriented  Speech fluent, appropriate  CN grossly intact  5/5 BUE/BLE   IMPRESSION:  45 y.o. female s/p iliac a injury, unstable L5-S1.   PLAN: - OR tomorrow for L5-S1 stabilization  I discussed the situation with the patient and her mother. They understand the need to complete the surgery to stabilize her spine. All questions were answered.

## 2015-04-22 ENCOUNTER — Inpatient Hospital Stay (HOSPITAL_COMMUNITY): Payer: PRIVATE HEALTH INSURANCE

## 2015-04-22 ENCOUNTER — Inpatient Hospital Stay (HOSPITAL_COMMUNITY): Payer: PRIVATE HEALTH INSURANCE | Admitting: Certified Registered Nurse Anesthetist

## 2015-04-22 ENCOUNTER — Encounter (HOSPITAL_COMMUNITY)
Admission: RE | Disposition: A | Discharge status: Skilled Nursing Facility | Payer: Self-pay | Source: Ambulatory Visit | Attending: Neurosurgery

## 2015-04-22 HISTORY — PX: TRANSFORAMINAL LUMBAR INTERBODY FUSION (TLIF) WITH PEDICLE SCREW FIXATION 1 LEVEL: SHX6141

## 2015-04-22 LAB — GLUCOSE, CAPILLARY
GLUCOSE-CAPILLARY: 143 mg/dL — AB (ref 65–99)
GLUCOSE-CAPILLARY: 109 mg/dL — AB (ref 65–99)
Glucose-Capillary: 131 mg/dL — ABNORMAL HIGH (ref 65–99)
Glucose-Capillary: 101 mg/dL — ABNORMAL HIGH (ref 65–99)

## 2015-04-22 LAB — PREPARE RBC (CROSSMATCH)

## 2015-04-22 SURGERY — TRANSFORAMINAL LUMBAR INTERBODY FUSION (TLIF) WITH PEDICLE SCREW FIXATION 1 LEVEL
Anesthesia: General | Site: Spine Lumbar

## 2015-04-22 MED ORDER — THROMBIN 20000 UNITS EX SOLR
CUTANEOUS | Status: DC | PRN
  Administered 2015-04-22: 19:00:00 via TOPICAL

## 2015-04-22 MED ORDER — DEXAMETHASONE SODIUM PHOSPHATE 4 MG/ML IJ SOLN
INTRAMUSCULAR | Status: DC | PRN
  Administered 2015-04-22: 4 mg via INTRAVENOUS

## 2015-04-22 MED ORDER — ROCURONIUM BROMIDE 100 MG/10ML IV SOLN
INTRAVENOUS | Status: DC | PRN
  Administered 2015-04-22: 10 mg via INTRAVENOUS
  Administered 2015-04-22: 40 mg via INTRAVENOUS

## 2015-04-22 MED ORDER — STERILE WATER FOR INJECTION IJ SOLN
INTRAMUSCULAR | Status: AC
  Filled 2015-04-22: qty 10

## 2015-04-22 MED ORDER — SODIUM CHLORIDE 0.9 % IR SOLN
Status: DC | PRN
  Administered 2015-04-22: 19:00:00

## 2015-04-22 MED ORDER — LIDOCAINE HCL (CARDIAC) 20 MG/ML IV SOLN
INTRAVENOUS | Status: AC
  Filled 2015-04-22: qty 20

## 2015-04-22 MED ORDER — FENTANYL CITRATE (PF) 100 MCG/2ML IJ SOLN
25.0000 ug | INTRAMUSCULAR | Status: DC | PRN
  Administered 2015-04-22 (×3): 50 ug via INTRAVENOUS

## 2015-04-22 MED ORDER — GLYCOPYRROLATE 0.2 MG/ML IJ SOLN
INTRAMUSCULAR | Status: AC
  Filled 2015-04-22: qty 3

## 2015-04-22 MED ORDER — SUCCINYLCHOLINE CHLORIDE 20 MG/ML IJ SOLN
INTRAMUSCULAR | Status: DC | PRN
  Administered 2015-04-22: 100 mg via INTRAVENOUS

## 2015-04-22 MED ORDER — LIDOCAINE HCL (CARDIAC) 20 MG/ML IV SOLN
INTRAVENOUS | Status: DC | PRN
  Administered 2015-04-22: 30 mg via INTRAVENOUS

## 2015-04-22 MED ORDER — FENTANYL CITRATE (PF) 100 MCG/2ML IJ SOLN
INTRAMUSCULAR | Status: AC
  Filled 2015-04-22: qty 2

## 2015-04-22 MED ORDER — HYDROMORPHONE HCL 1 MG/ML IJ SOLN
0.2500 mg | INTRAMUSCULAR | Status: DC | PRN
  Administered 2015-04-22 (×2): 0.5 mg via INTRAVENOUS

## 2015-04-22 MED ORDER — SUFENTANIL CITRATE 50 MCG/ML IV SOLN
INTRAVENOUS | Status: AC
  Filled 2015-04-22: qty 1

## 2015-04-22 MED ORDER — FENTANYL CITRATE (PF) 100 MCG/2ML IJ SOLN
INTRAMUSCULAR | Status: DC | PRN
  Administered 2015-04-22 (×5): 50 ug via INTRAVENOUS

## 2015-04-22 MED ORDER — PROPOFOL 10 MG/ML IV BOLUS
INTRAVENOUS | Status: AC
  Filled 2015-04-22: qty 20

## 2015-04-22 MED ORDER — 0.9 % SODIUM CHLORIDE (POUR BTL) OPTIME
TOPICAL | Status: DC | PRN
  Administered 2015-04-22: 1000 mL

## 2015-04-22 MED ORDER — SUCCINYLCHOLINE CHLORIDE 20 MG/ML IJ SOLN
INTRAMUSCULAR | Status: AC
  Filled 2015-04-22: qty 1

## 2015-04-22 MED ORDER — MIDAZOLAM HCL 2 MG/2ML IJ SOLN
INTRAMUSCULAR | Status: AC
  Filled 2015-04-22: qty 2

## 2015-04-22 MED ORDER — MIDAZOLAM HCL 5 MG/5ML IJ SOLN
INTRAMUSCULAR | Status: DC | PRN
  Administered 2015-04-22: 2 mg via INTRAVENOUS

## 2015-04-22 MED ORDER — DEXTROSE 5 % IV SOLN
3.0000 g | INTRAVENOUS | Status: DC | PRN
  Administered 2015-04-22: 3 g via INTRAVENOUS

## 2015-04-22 MED ORDER — FENTANYL CITRATE (PF) 100 MCG/2ML IJ SOLN
INTRAMUSCULAR | Status: AC
  Administered 2015-04-22: 50 ug via INTRAVENOUS
  Filled 2015-04-22: qty 2

## 2015-04-22 MED ORDER — TRACE MINERALS CR-CU-MN-SE-ZN 10-1000-500-60 MCG/ML IV SOLN
INTRAVENOUS | Status: DC
  Administered 2015-04-22: 22:00:00 via INTRAVENOUS
  Filled 2015-04-22 (×2): qty 2636

## 2015-04-22 MED ORDER — PROPOFOL 10 MG/ML IV BOLUS
INTRAVENOUS | Status: DC | PRN
  Administered 2015-04-22: 30 mg via INTRAVENOUS
  Administered 2015-04-22: 170 mg via INTRAVENOUS

## 2015-04-22 MED ORDER — ONDANSETRON HCL 4 MG/2ML IJ SOLN
INTRAMUSCULAR | Status: AC
  Filled 2015-04-22: qty 4

## 2015-04-22 MED ORDER — SUGAMMADEX SODIUM 500 MG/5ML IV SOLN
INTRAVENOUS | Status: DC | PRN
  Administered 2015-04-22: 300 mg via INTRAVENOUS

## 2015-04-22 MED ORDER — FENTANYL CITRATE (PF) 250 MCG/5ML IJ SOLN
INTRAMUSCULAR | Status: AC
  Filled 2015-04-22: qty 5

## 2015-04-22 MED ORDER — SUGAMMADEX SODIUM 500 MG/5ML IV SOLN
INTRAVENOUS | Status: AC
  Filled 2015-04-22: qty 5

## 2015-04-22 MED ORDER — SUFENTANIL CITRATE 50 MCG/ML IV SOLN
INTRAVENOUS | Status: DC | PRN
  Administered 2015-04-22 (×2): 10 ug via INTRAVENOUS

## 2015-04-22 MED ORDER — PHENYLEPHRINE 40 MCG/ML (10ML) SYRINGE FOR IV PUSH (FOR BLOOD PRESSURE SUPPORT)
PREFILLED_SYRINGE | INTRAVENOUS | Status: AC
  Filled 2015-04-22: qty 30

## 2015-04-22 MED ORDER — DEXAMETHASONE SODIUM PHOSPHATE 4 MG/ML IJ SOLN
INTRAMUSCULAR | Status: AC
  Filled 2015-04-22: qty 1

## 2015-04-22 MED ORDER — ROCURONIUM BROMIDE 50 MG/5ML IV SOLN
INTRAVENOUS | Status: AC
  Filled 2015-04-22: qty 2

## 2015-04-22 MED ORDER — ONDANSETRON HCL 4 MG/2ML IJ SOLN
INTRAMUSCULAR | Status: DC | PRN
  Administered 2015-04-22: 4 mg via INTRAVENOUS

## 2015-04-22 MED ORDER — LACTATED RINGERS IV SOLN
INTRAVENOUS | Status: DC | PRN
  Administered 2015-04-22: 17:00:00 via INTRAVENOUS

## 2015-04-22 MED ORDER — HYDROMORPHONE HCL 1 MG/ML IJ SOLN
INTRAMUSCULAR | Status: AC
  Administered 2015-04-22: 0.5 mg via INTRAVENOUS
  Filled 2015-04-22: qty 1

## 2015-04-22 MED ORDER — EPHEDRINE SULFATE 50 MG/ML IJ SOLN
INTRAMUSCULAR | Status: AC
  Filled 2015-04-22: qty 1

## 2015-04-22 MED ORDER — THROMBIN 5000 UNITS EX SOLR
OROMUCOSAL | Status: DC | PRN
  Administered 2015-04-22: 19:00:00 via TOPICAL

## 2015-04-22 SURGICAL SUPPLY — 70 items
BAG DECANTER FOR FLEXI CONT (MISCELLANEOUS) ×3 IMPLANT
BENZOIN TINCTURE PRP APPL 2/3 (GAUZE/BANDAGES/DRESSINGS) IMPLANT
BIT DRILL 3.5 POWEREASE (BIT) ×2 IMPLANT
BIT DRILL 3.5MM POWEREASE (BIT) ×1
BLADE CLIPPER SURG (BLADE) IMPLANT
BLADE SURG 11 STRL SS (BLADE) ×3 IMPLANT
BUR MATCHSTICK NEURO 3.0 LAGG (BURR) ×3 IMPLANT
BUR PRECISION FLUTE 5.0 (BURR) ×3 IMPLANT
CANISTER SUCT 3000ML PPV (MISCELLANEOUS) ×3 IMPLANT
CLOSURE WOUND 1/2 X4 (GAUZE/BANDAGES/DRESSINGS)
CONT SPEC 4OZ CLIKSEAL STRL BL (MISCELLANEOUS) ×3 IMPLANT
COVER BACK TABLE 60X90IN (DRAPES) ×3 IMPLANT
DECANTER SPIKE VIAL GLASS SM (MISCELLANEOUS) ×3 IMPLANT
DEVICE INTERBODY ELEVATE 23X7 (Cage) ×3 IMPLANT
DRAPE C-ARM 42X72 X-RAY (DRAPES) ×3 IMPLANT
DRAPE C-ARMOR (DRAPES) ×3 IMPLANT
DRAPE LAPAROTOMY 100X72X124 (DRAPES) ×3 IMPLANT
DRAPE POUCH INSTRU U-SHP 10X18 (DRAPES) ×3 IMPLANT
DRAPE SURG 17X23 STRL (DRAPES) ×3 IMPLANT
DRSG OPSITE POSTOP 4X8 (GAUZE/BANDAGES/DRESSINGS) ×3 IMPLANT
DURAPREP 26ML APPLICATOR (WOUND CARE) ×3 IMPLANT
ELECT BLADE 4.0 EZ CLEAN MEGAD (MISCELLANEOUS) ×3
ELECT REM PT RETURN 9FT ADLT (ELECTROSURGICAL) ×3
ELECTRODE BLDE 4.0 EZ CLN MEGD (MISCELLANEOUS) ×1 IMPLANT
ELECTRODE REM PT RTRN 9FT ADLT (ELECTROSURGICAL) ×1 IMPLANT
GAUZE SPONGE 4X4 12PLY STRL (GAUZE/BANDAGES/DRESSINGS) IMPLANT
GAUZE SPONGE 4X4 16PLY XRAY LF (GAUZE/BANDAGES/DRESSINGS) IMPLANT
GLOVE BIO SURGEON STRL SZ 6.5 (GLOVE) ×6 IMPLANT
GLOVE BIO SURGEONS STRL SZ 6.5 (GLOVE) ×3
GLOVE BIOGEL PI IND STRL 7.0 (GLOVE) ×2 IMPLANT
GLOVE BIOGEL PI IND STRL 7.5 (GLOVE) ×1 IMPLANT
GLOVE BIOGEL PI INDICATOR 7.0 (GLOVE) ×4
GLOVE BIOGEL PI INDICATOR 7.5 (GLOVE) ×2
GLOVE ECLIPSE 6.5 STRL STRAW (GLOVE) ×3 IMPLANT
GLOVE ECLIPSE 7.0 STRL STRAW (GLOVE) ×3 IMPLANT
GOWN STRL REUS W/ TWL LRG LVL3 (GOWN DISPOSABLE) ×3 IMPLANT
GOWN STRL REUS W/ TWL XL LVL3 (GOWN DISPOSABLE) ×2 IMPLANT
GOWN STRL REUS W/TWL 2XL LVL3 (GOWN DISPOSABLE) IMPLANT
GOWN STRL REUS W/TWL LRG LVL3 (GOWN DISPOSABLE) ×6
GOWN STRL REUS W/TWL XL LVL3 (GOWN DISPOSABLE) ×4
HEMOSTAT POWDER KIT SURGIFOAM (HEMOSTASIS) ×3 IMPLANT
KIT BASIN OR (CUSTOM PROCEDURE TRAY) ×3 IMPLANT
KIT INFUSE X SMALL 1.4CC (Orthopedic Implant) ×3 IMPLANT
KIT ROOM TURNOVER OR (KITS) ×3 IMPLANT
LIQUID BAND (GAUZE/BANDAGES/DRESSINGS) ×3 IMPLANT
MILL MEDIUM DISP (BLADE) ×3 IMPLANT
NEEDLE HYPO 18GX1.5 BLUNT FILL (NEEDLE) IMPLANT
NEEDLE HYPO 25X1 1.5 SAFETY (NEEDLE) ×3 IMPLANT
NEEDLE SPNL 18GX3.5 QUINCKE PK (NEEDLE) IMPLANT
NS IRRIG 1000ML POUR BTL (IV SOLUTION) ×3 IMPLANT
PACK LAMINECTOMY NEURO (CUSTOM PROCEDURE TRAY) ×3 IMPLANT
PAD ARMBOARD 7.5X6 YLW CONV (MISCELLANEOUS) ×9 IMPLANT
ROD CC 30MM (Rod) ×6 IMPLANT
SCREW 5.5X30MM (Screw) ×3 IMPLANT
SCREW 5.5X35MM (Screw) ×8 IMPLANT
SCREW BN 35X5.5XMA NS SPNE (Screw) ×4 IMPLANT
SCREW SET SOLERA (Screw) ×12 IMPLANT
SCREW SET SOLERA TI (Screw) ×6 IMPLANT
SPONGE LAP 4X18 X RAY DECT (DISPOSABLE) IMPLANT
SPONGE SURGIFOAM ABS GEL 100 (HEMOSTASIS) ×3 IMPLANT
STRIP BIOACTIVE VITOSS 25X52X4 (Orthopedic Implant) ×3 IMPLANT
STRIP CLOSURE SKIN 1/2X4 (GAUZE/BANDAGES/DRESSINGS) IMPLANT
SUT VIC AB 0 CT1 18XCR BRD8 (SUTURE) ×3 IMPLANT
SUT VIC AB 0 CT1 8-18 (SUTURE) ×6
SUT VICRYL 3-0 RB1 18 ABS (SUTURE) ×6 IMPLANT
SYR 3ML LL SCALE MARK (SYRINGE) IMPLANT
TOWEL OR 17X24 6PK STRL BLUE (TOWEL DISPOSABLE) ×3 IMPLANT
TOWEL OR 17X26 10 PK STRL BLUE (TOWEL DISPOSABLE) ×3 IMPLANT
TRAP SPECIMEN MUCOUS 40CC (MISCELLANEOUS) ×3 IMPLANT
WATER STERILE IRR 1000ML POUR (IV SOLUTION) ×3 IMPLANT

## 2015-04-22 NOTE — Progress Notes (Signed)
   Daily Progress Note  S/p L Distal CIA ligation, L CIA to CFA bypass, repair of L CIV  L groin: good granulation, clean, wound contracting  - L groin incision healing well: continue wet-to-dry dressing  Leonides SakeBrian Saxon Crosby, MD [facility] Office: [phone] Pager: [phone]  04/22/2015, 9:37 AM

## 2015-04-22 NOTE — Transfer of Care (Signed)
Immediate Anesthesia Transfer of Care Note  Patient: Kristina Estes  Procedure(s) Performed: Procedure(s): LUMBAR FIVE-SACRAL ONE TRANSFORAMINAL LUMBAR INTERBODY FUSION WITH PEDICLE SCREW FIXATION (N/A)  Patient Location: PACU  Anesthesia Type:General  Level of Consciousness: alert , oriented, patient cooperative and responds to stimulation  Airway & Oxygen Therapy: Patient Spontanous Breathing and Patient connected to nasal cannula oxygen  Post-op Assessment: Report given to RN, Post -op Vital signs reviewed and stable and Patient moving all extremities X 4  Post vital signs: Reviewed and stable  Last Vitals:  Filed Vitals:   04/22/15 1432 04/22/15 2016  BP:    Pulse:    Temp: 36.7 C 36.7 C  Resp:      Complications: No apparent anesthesia complications

## 2015-04-22 NOTE — Progress Notes (Signed)
PARENTERAL NUTRITION CONSULT NOTE - FOLLOW UP  Pharmacy Consult for TPN Indication: Open abdomen, s/p closure  Allergies  Allergen Reactions  . Propoxyphene Other (See Comments)    Unspecified    Patient Measurements: Height:  (165.1 cm) Weight: 288 lb (130.636 kg) IBW/kg (Calculated) : 57  Vital Signs: Temp: 97.9 F (36.6 C) (03/15 0355) Temp Source: Oral (03/15 0355) BP: 124/66 mmHg (03/15 0355) Pulse Rate: 109 (03/15 0355) Intake/Output from previous day: 03/14 0701 - 03/15 0700 In: 4367.2 [ZOX:0960.4] Out: 3597.5 [Urine:3475; Drains:122.5] Intake/Output from this shift:    Labs: No results for input(s): WBC, HGB, HCT, PLT, APTT, INR in the last 72 hours.   Recent Labs  04/20/15 0355 04/20/15 0600  NA  --  134*  K  --  4.2  CL  --  103  CO2  --  24  GLUCOSE  --  150*  BUN  --  19  CREATININE  --  0.61  CALCIUM  --  8.2*  MG  --  1.9  PHOS  --  3.3  PROT  --  6.4*  ALBUMIN  --  1.7*  AST  --  48*  ALT  --  57*  ALKPHOS  --  137*  BILITOT  --  1.5*  PREALBUMIN 20.1  --   TRIG  --  88   Estimated Creatinine Clearance: 121.1 mL/min (by C-G formula based on Cr of 0.61).    Recent Labs  04/21/15 2335 04/22/15 0407 04/22/15 0803  GLUCAP 137* 143* 131*    Medications:  Scheduled:  . antiseptic oral rinse  7 mL Mouth Rinse q12n4p  . antiseptic oral rinse  7 mL Mouth Rinse QID  . chlorhexidine  15 mL Mouth Rinse BID  . feeding supplement  1 Container Oral BID BM  . insulin aspart  0-20 Units Subcutaneous 6 times per day  . metoCLOPramide (REGLAN) injection  10 mg Intravenous 4 times per day  . pantoprazole (PROTONIX) IV  40 mg Intravenous Q24H  . sodium chloride flush  10-40 mL Intracatheter Q12H  . zolpidem  5 mg Oral QHS    Insulin Requirements in the past 24 hours:  12 units resistant SSI in the last 24h (on cyclic TPN) + 20 units of regular insulin in TPN  Assessment: 44 YOF admitted on 03/27/15 with back and leg pain and underwent  L5 laminectomy with facetectomy later that day. Intraoperatively patient went into shock and CT revealed large left RP hematoma. Patient returned to the OR for ex-lap with repair of left iliac vein, ligation of distal left CIA,  left CIA-CFA bypass, and placement of abdominal VAC.  GI: hx GERD / obesity. Baseline prealbumin low at 8.4 >> 14.1>20.1 (frequent OR visits for abd washout). s/p abdominal wall closure with mesh on 3/1. Found to have a large abd wall hematoma 3/6, s/p evacuation 3/8. Emesis/NG O/P none/24h, drains . (+)flatus, (+)BM 3/13.  PPI IV, Reglan IV. CLD x 2 days now NPO for OR today  Endo: no hx DM - hypoglycemic prior to TPN initiation. CBGs<150  Lytes: Na 134.CoCa 10,Ca x Phos 33  Renal: SCr stable, CrCL > 100 ml/min - good UOP 1 ml/kg/hr, I/O +1.3  Pulm: extubated 3/2, reintubated 3/6, extubated 3/9. Currently RA  Cards: angina - BP variable. HR 108. No CV meds.  AC: holding heparin for new left femoral DVT (dx 03/29/15) d/t bleeding, s/p IVC filter placement 3/7. ABLA - hgb 8, plts WNL, INR 1.3 (3/8)  Hepatobil: 3/9: AST/ALT mildly elevated, tbili improved to 2.6>1.5, Jaundice in sclera on 3/3, resolved now. TG improved to 210>88  Neuro:  Paresthesias/radicular sx when sitting up, neurosurg seeing.    ID: Tmax 99.3. No active issues  Best Practices: SCDs  TPN Access: right IJ placed 2/17>>2/25, triple lumen PICC 2/25>>  TPN start date: 2/21 >>  Current Nutrition:  TPN+ Boost breeze + CLD  Nutritional Goals: 2100-2300 kCal and 130-140 gm protein per day Per RD 3/13  Plan:  -Cyclic TPN: Clinimix E 5/15, cycle 2636 mLs over 14 hrs: 17200ml/hr x 1 hr, then 203 ml/hr x 12 hrs, and 100 ml/hr x 1 hr  -TPN will provide a weekly average of 2007 kCal per day and 132gm protein per day, with Boost, pt will meet 100% of nutrition goals - Lipids 20% on Tues and Fri to minimize kCal provision - Continue multivitamin and trace elements in TPN - 20 units  regular insulin in TPN, continue resistant SSI Q4H - F/u CLD + weaning TPN    Agapito GamesAlison Isabel Freese, PharmD, BCPS Clinical Pharmacist 04/22/2015 9:02 AM

## 2015-04-22 NOTE — Progress Notes (Signed)
PT Cancellation Note  Patient Details Name: Jackquline BoschLaverne Dobransky MRN: [id number] DOB: 09/15/1970   Cancelled Treatment:    Reason Eval/Treat Not Completed: Medical issues which prohibited therapy. Plan is to return to OR today.  PT will continue to follow and will attempt to see pt tomorrow, if medically appropriate.  Encarnacion ChuAshley Abashian PT, DPT  Pager: [phone] Phone: [phone] 04/22/2015, 8:54 AM

## 2015-04-22 NOTE — Anesthesia Procedure Notes (Signed)
Procedure Name: Intubation Date/Time: 04/22/2015 5:36 PM Performed by: Adonis HousekeeperNGELL, Jashayla Glatfelter M Pre-anesthesia Checklist: Patient identified, Emergency Drugs available, Suction available and Patient being monitored Patient Re-evaluated:Patient Re-evaluated prior to inductionOxygen Delivery Method: Circle system utilized Preoxygenation: Pre-oxygenation with 100% oxygen Intubation Type: IV induction Ventilation: Mask ventilation without difficulty Laryngoscope Size: Glidescope and 3 Grade View: Grade I Tube type: Oral Tube size: 7.0 mm Number of attempts: 1 Airway Equipment and Method: Stylet and Video-laryngoscopy Placement Confirmation: ETT inserted through vocal cords under direct vision,  positive ETCO2 and breath sounds checked- equal and bilateral Secured at: 22 cm Tube secured with: Tape Dental Injury: Teeth and Oropharynx as per pre-operative assessment

## 2015-04-22 NOTE — Op Note (Signed)
PREOP DIAGNOSIS:  1. Spondylolisthesis, L5-S1  POSTOP DIAGNOSIS: Same  PROCEDURE: 1. Placement of anterior interbody device - Medtronic Expandable 7mm cage 2. Posterior instrumentation using cortical pedicle screws at L5 - S1 3. Interbody arthrodesis, L5-S1 4. Use of non-structural bone allograft - Vitoss BA 5. Use of BMP  SURGEON: Dr. Lisbeth RenshawNeelesh Shamarcus Hoheisel, MD  ASSISTANT: Dr. Coletta MemosKyle Cabbell, MD  ANESTHESIA: General Endotracheal  EBL: 200cc  SPECIMENS: None  DRAINS: None  COMPLICATIONS: None immediate  CONDITION: Hemodynamically stable to PACU  HISTORY: Jackquline BoschLaverne Pitz is a 45 y.o. female who initially underwent surgery about 1 month ago during which she had inadvertent left iliac artery and vein injury. Procedure was terminated prior to instrumentation and she underwent vascular repair emergently. She has had a prolonged hospital course requiring multiple OR trips for abdominal closure. She has ultimately been closed about 1 week ago and now presents for instrumentation for her unstable L5-S1.   PROCEDURE IN DETAIL: After informed consent was obtained and witnessed, the patient was brought to the operating room. After induction of general anesthesia, the patient was positioned on the operative table in the prone position. All pressure points were meticulously padded. Incision was then prepped and draped in the usual sterile fashion.  After timeout was conducted, skin incision was then made sharply and Bovie electrocautery was used to dissect the subcutaneous tissue until the lumbodorsal fascia was identified and incised. I identified the L4 and S1 lamina and elevated the muscle . Self-retaining retractor was placed.   The previous L5 pilot holes were identified. Dissection was carried out lateral to the thecal sac on the right and the annular defect was identified. Discectomy which was largely done at the prior operation was completed. A 7mm expandable cage was selected, and the disc  space was filled with BMP and Vitoss. The cage was then tapped into place and position confirmed with fluoroscopy.  At this point, the S1 cortical pedicle screws were drilled and tapped to 5.5 mm. Screws were then placed in L5 and S1. Rod was then placed, set screws placed and final tightened. Final AP and lateral fluoroscopic images were taken. The right L5 screw appeared somewhat medial, so the right set screws were removed, a new pilot hole was drilled and tapped, and a 5.5 x 30mm screw was placed. AP and lateral fluoro confirmed good position. Rod was then replaced and set screws secured and final tightened.  The wound was then irrigated with copious amounts of antibiotic saline, then closed in standard fashion using a combination of interrupted 0 and 3-0 Vicryl stitches in the muscular, fascial, and subcutaneous layers. Skin was then closed using standard Dermabond. Sterile dressing was then applied. The patient was then transferred to the stretcher, extubated, and taken to the postanesthesia care unit in stable hemodynamic condition.  At the end of the case all sponge, needle, cottonoid, and instrument counts were correct.

## 2015-04-22 NOTE — Progress Notes (Signed)
No issues overnight.   EXAM:  BP 121/62 mmHg  Pulse 106  Temp(Src) 98.1 F (36.7 C) (Oral)  Resp 25  Ht 5\' 5"  (1.651 m)  Wt 130.636 kg (288 lb)  BMI 47.93 kg/m2  SpO2 100%  LMP 03/27/2015  Awake, alert, oriented  Speech fluent, appropriate  CN grossly intact  5/5 BUE/BLE   IMPRESSION:  45 y.o. female requiring stabilization of L5-S1  PLAN: - will proceed with instrumentation, likely single right TLIF graft with cortical screws  I have reviewed the surgery with the patient. All questions were answered, she is ready to proceed.

## 2015-04-22 NOTE — Progress Notes (Signed)
Patient ID: Kristina Estes, female   DOB: 08/21/1970, 45 y.o.   MRN: [id number]     [facility]      [address]   [place], [place] [id number]    Phone: [phone] FAX: [phone]     Subjective: Tolerated fulls.  No n/v.  ruq drain 67.235ml/24h luq drain 55ml  Objective:  Vital signs:  Filed Vitals:   04/21/15 2008 04/21/15 2333 04/21/15 2340 04/22/15 0355  BP:   111/78 124/66  Pulse:   112 109  Temp: 98.3 F (36.8 C) 98.6 F (37 C)  97.9 F (36.6 C)  TempSrc: Oral Oral  Oral  Resp:   21 24  Height:      Weight:      SpO2:   98% 98%    Last BM Date: 04/20/15  Intake/Output   Yesterday:  03/14 0701 - 03/15 0700 In: 9528.44367.2 [XLK:4401.0][TPN:4367.2] Out: 3597.5 [Urine:3475; Drains:122.5] This shift:    Physical Exam: General: Pt awake/alert/oriented x4 in no acute distress  Abdomen: Soft. Nondistended. Mildly tender at incisions only.   midline incision, staples in place, no erythema. ruq and luq drains with sanguinous output. No evidence of peritonitis. No incarcerated hernias.   Problem List:   Active Problems:   Lumbar spondylolysis   Acute respiratory failure with hypoxia (HCC)   Hemorrhagic shock   Hypervolemia   Acute respiratory failure with hypercapnia (HCC)   Fever, unspecified   Pressure ulcer   Hematoma of abdominal wall   Acute respiratory failure (HCC)   Atelectasis   Encounter for imaging study to confirm nasogastric (NG) tube placement   Abdominal swelling   Spinal stenosis    Results:   Labs: Results for orders placed or performed during the hospital encounter of 03/27/15 (from the past 48 hour(s))  Glucose, capillary     Status: None   Collection Time: 04/20/15 12:55 PM  Result Value Ref Range   Glucose-Capillary 89 65 - 99 mg/dL   Comment 1 Notify RN    Comment 2 Document in Chart   Glucose, capillary     Status: None   Collection Time: 04/20/15  4:32 PM  Result Value Ref Range    Glucose-Capillary 96 65 - 99 mg/dL  Glucose, capillary     Status: Abnormal   Collection Time: 04/20/15  7:58 PM  Result Value Ref Range   Glucose-Capillary 136 (H) 65 - 99 mg/dL  Glucose, capillary     Status: Abnormal   Collection Time: 04/20/15 11:42 PM  Result Value Ref Range   Glucose-Capillary 144 (H) 65 - 99 mg/dL  Glucose, capillary     Status: Abnormal   Collection Time: 04/21/15  3:13 AM  Result Value Ref Range   Glucose-Capillary 148 (H) 65 - 99 mg/dL  Glucose, capillary     Status: Abnormal   Collection Time: 04/21/15  7:33 AM  Result Value Ref Range   Glucose-Capillary 142 (H) 65 - 99 mg/dL   Comment 1 Notify RN    Comment 2 Document in Chart   Glucose, capillary     Status: Abnormal   Collection Time: 04/21/15 11:07 AM  Result Value Ref Range   Glucose-Capillary 110 (H) 65 - 99 mg/dL   Comment 1 Notify RN    Comment 2 Document in Chart   Glucose, capillary     Status: Abnormal   Collection Time: 04/21/15  3:44 PM  Result Value Ref Range   Glucose-Capillary 101 (H) 65 - 99  mg/dL   Comment 1 Notify RN    Comment 2 Document in Chart   Glucose, capillary     Status: Abnormal   Collection Time: 04/21/15  8:06 PM  Result Value Ref Range   Glucose-Capillary 132 (H) 65 - 99 mg/dL  Glucose, capillary     Status: Abnormal   Collection Time: 04/21/15 11:35 PM  Result Value Ref Range   Glucose-Capillary 137 (H) 65 - 99 mg/dL  Glucose, capillary     Status: Abnormal   Collection Time: 04/22/15  4:07 AM  Result Value Ref Range   Glucose-Capillary 143 (H) 65 - 99 mg/dL    Imaging / Studies: No results found.  Medications / Allergies:  Scheduled Meds: . antiseptic oral rinse  7 mL Mouth Rinse q12n4p  . antiseptic oral rinse  7 mL Mouth Rinse QID  . chlorhexidine  15 mL Mouth Rinse BID  . feeding supplement  1 Container Oral BID BM  . insulin aspart  0-20 Units Subcutaneous 6 times per day  . metoCLOPramide (REGLAN) injection  10 mg Intravenous 4 times per day  .  pantoprazole (PROTONIX) IV  40 mg Intravenous Q24H  . sodium chloride flush  10-40 mL Intracatheter Q12H  . zolpidem  5 mg Oral QHS   Continuous Infusions: . Marland KitchenTPN (CLINIMIX-E) Adult 100 mL/hr at 04/22/15 0700  . sodium chloride 10 mL/hr at 04/13/15 0600  . fat emulsion 240 mL (04/21/15 1803)   PRN Meds:.fentaNYL (SUBLIMAZE) injection, methocarbamol, ondansetron (ZOFRAN) IV, oxyCODONE-acetaminophen, sodium chloride flush  Antibiotics: Anti-infectives    Start     Dose/Rate Route Frequency Ordered Stop   04/15/15 0930  polymyxin B 500,000 Units, bacitracin 50,000 Units in sodium chloride irrigation 0.9 % 500 mL irrigation  Status:  Discontinued       As needed 04/15/15 0930 04/15/15 0959   04/13/15 2230  vancomycin (VANCOCIN) IVPB 1000 mg/200 mL premix  Status:  Discontinued     1,000 mg 200 mL/hr over 60 Minutes Intravenous Every 12 hours 04/13/15 0948 04/16/15 1007   04/13/15 1200  imipenem-cilastatin (PRIMAXIN) 500 mg in sodium chloride 0.9 % 100 mL IVPB  Status:  Discontinued     500 mg 200 mL/hr over 30 Minutes Intravenous 4 times per day 04/13/15 0946 04/16/15 1007   04/13/15 1000  ceFAZolin (ANCEF) IVPB 2 g/50 mL premix  Status:  Discontinued     2 g 100 mL/hr over 30 Minutes Intravenous 3 times per day 04/13/15 0901 04/13/15 0936   04/13/15 1000  vancomycin (VANCOCIN) 2,500 mg in sodium chloride 0.9 % 500 mL IVPB     2,500 mg 250 mL/hr over 120 Minutes Intravenous  Once 04/13/15 0948 04/13/15 1405   04/02/15 2330  vancomycin (VANCOCIN) IVPB 1000 mg/200 mL premix  Status:  Discontinued     1,000 mg 200 mL/hr over 60 Minutes Intravenous Every 12 hours 04/02/15 1015 04/05/15 0849   04/02/15 1830  piperacillin-tazobactam (ZOSYN) IVPB 3.375 g  Status:  Discontinued     3.375 g 12.5 mL/hr over 240 Minutes Intravenous 3 times per day 04/02/15 1015 04/09/15 0804   04/02/15 1100  cefUROXime (ZINACEF) 1.5 g in dextrose 5 % 50 mL IVPB  Status:  Discontinued     1.5 g 100 mL/hr over 30  Minutes Intravenous To [facility] 04/01/15 1106 04/02/15 1448   04/02/15 1100  vancomycin (VANCOCIN) 2,500 mg in sodium chloride 0.9 % 500 mL IVPB     2,500 mg 250 mL/hr over 120 Minutes Intravenous  Once 04/02/15 1029 04/02/15 1342   04/02/15 1030  vancomycin (VANCOCIN) 2,250 mg in sodium chloride 0.9 % 500 mL IVPB  Status:  Discontinued     2,250 mg 250 mL/hr over 120 Minutes Intravenous  Once 04/02/15 1015 04/02/15 1029   04/02/15 1030  piperacillin-tazobactam (ZOSYN) IVPB 3.375 g     3.375 g 100 mL/hr over 30 Minutes Intravenous  Once 04/02/15 1015 04/02/15 1212   03/27/15 2130  cefUROXime (ZINACEF) 1.5 g in dextrose 5 % 50 mL IVPB  Status:  Discontinued     1.5 g 100 mL/hr over 30 Minutes Intravenous Every 12 hours 03/27/15 2124 03/27/15 2132   03/27/15 1600  ceFAZolin (ANCEF) IVPB 2 g/50 mL premix     2 g 100 mL/hr over 30 Minutes Intravenous Every 8 hours 03/27/15 1322 03/28/15 0759   03/27/15 1447  ceFAZolin (ANCEF) 1-5 GM-% IVPB    Comments:  Merril Abbe   : cabinet override      03/27/15 1447 03/28/15 0259   03/27/15 0800  bacitracin 50,000 Units in sodium chloride irrigation 0.9 % 500 mL irrigation  Status:  Discontinued       As needed 03/27/15 0943 03/27/15 1223   03/27/15 0700  ceFAZolin (ANCEF) 3 g in dextrose 5 % 50 mL IVPB     3 g 130 mL/hr over 30 Minutes Intravenous To [facility] 03/26/15 1409 03/27/15 0750       Assessment/Plan S/p L5 laminectomy (2/17/7 - Dr. Conchita Paris)  Left iliac vein and left common iliac artery bleeding  S/p Ex lap, repair of left iliac vein, ligation of distal left common iliac artery, left common iliac artery to left common femoral bypass with 8mm Dacron, placement of abdominal negative pressure dressing (03/27/15 - Early and Imogene Burn)  Subsequently 4 abdominal washouts and wound vac changes 2/19, 2/21, 2/23, 2/27 Imogene Burn) s/p exploratory laparotomy, abdominal wound closure with Phasix mesh, drain placement(Dr.  Derrell Lolling)  Post-operative abdominal wall hematoma s/p Exploration of abdominal wound, evacuation of hematoma, placement of negative pressure dressing (Ingram)(04/13/2015)  POD#7 wound exploration, evacuation of residual hematoma, closure of sking and subq tissue(Dr. Derrell Lolling) -NPO for surgery, post op, resume fulls, then advance as tolerated  -continue with drains -continue with staples(remove POD#14) -the patient is stable for lumbar instrumentation at any time from a general surgical standpoint.  PCM-TPN until PO intake is adequate. Prealbumin 20(3/13) ABLA-stable  Left common iliac DVT-s/p IVC filter  Disp -OR today.  can transfer out from a surgical standpoint    Ashok Norris, [facility] Pager [phone]) For consults and floor pages call [phone](7A-4:30P)  04/22/2015 8:35 AM

## 2015-04-22 NOTE — Anesthesia Preprocedure Evaluation (Addendum)
Anesthesia Evaluation  Patient identified by MRN, date of birth, ID band Patient awake    Reviewed: Allergy & Precautions, H&P , NPO status , Patient's Chart, lab work & pertinent test results  Airway Mallampati: II  TM Distance: >3 FB Neck ROM: Full    Dental no notable dental hx. (+) Teeth Intact   Pulmonary  Intubated on vent   Pulmonary exam normal + rhonchi  + decreased breath sounds      Cardiovascular negative cardio ROS  + dysrhythmias  Rhythm:Regular Rate:Normal     Neuro/Psych negative neurological ROS  negative psych ROS   GI/Hepatic Neg liver ROS, GERD  ,  Endo/Other  Morbid obesity  Renal/GU negative Renal ROS  negative genitourinary   Musculoskeletal  (+) Arthritis , Osteoarthritis,    Abdominal (+) + obese,   Peds  Hematology negative hematology ROS (+) anemia ,   Anesthesia Other Findings MO Anemia  Reproductive/Obstetrics negative OB ROS                          Anesthesia Physical  Anesthesia Plan  ASA: IV  Anesthesia Plan: General   Post-op Pain Management:    Induction: Intravenous  Airway Management Planned: Oral ETT and Video Laryngoscope Planned  Additional Equipment:   Intra-op Plan:   Post-operative Plan: Possible Post-op intubation/ventilation  Informed Consent: I have reviewed the patients History and Physical, chart, labs and discussed the procedure including the risks, benefits and alternatives for the proposed anesthesia with the patient or authorized representative who has indicated his/her understanding and acceptance.   Dental advisory given  Plan Discussed with: CRNA  Anesthesia Plan Comments:        Anesthesia Quick Evaluation                                   Anesthesia Evaluation  Patient identified by MRN, date of birth, ID band Patient awake    Reviewed: Allergy & Precautions, H&P , NPO status , Patient's Chart, lab  work & pertinent test results  History of Anesthesia Complications (+) DIFFICULT IV STICK / SPECIAL LINE and history of anesthetic complications  Airway Mallampati: II  TM Distance: >3 FB Neck ROM: full    Dental  (+) Partial Lower, Partial Upper, Dental Advisory Given   Pulmonary asthma ,    Pulmonary exam normal breath sounds clear to auscultation       Cardiovascular (-) anginaNormal cardiovascular exam Rhythm:regular Rate:Normal  03/24/15 Exercise Stress: Conclusion: No chest pain, normal stress, low risk for ischemia.    Neuro/Psych negative neurological ROS     GI/Hepatic Neg liver ROS, GERD  ,  Endo/Other  Morbid obesity  Renal/GU negative Renal ROS     Musculoskeletal  (+) Arthritis ,   Abdominal (+) + obese,   Peds  Hematology negative hematology ROS (+)   Anesthesia Other Findings   Reproductive/Obstetrics negative OB ROS                          Anesthesia Physical Anesthesia Plan  ASA: III  Anesthesia Plan: General   Post-op Pain Management:    Induction: Intravenous  Airway Management Planned: Oral ETT  Additional Equipment:   Intra-op Plan:   Post-operative Plan: Possible Post-op intubation/ventilation  Informed Consent: I have reviewed the patients History and Physical, chart, labs and discussed the procedure including the  risks, benefits and alternatives for the proposed anesthesia with the patient or authorized representative who has indicated his/her understanding and acceptance.   Dental Advisory Given and Dental advisory given  Plan Discussed with: Anesthesiologist, CRNA and Surgeon  Anesthesia Plan Comments: (Patient is super morbidly obese with lumbar spondylolisthesis)       Anesthesia Quick Evaluation

## 2015-04-23 ENCOUNTER — Encounter (HOSPITAL_COMMUNITY): Payer: Self-pay | Admitting: Neurosurgery

## 2015-04-23 LAB — CBC
HEMATOCRIT: 27.8 % — AB (ref 36.0–46.0)
Hemoglobin: 8.7 g/dL — ABNORMAL LOW (ref 12.0–15.0)
MCH: 28.8 pg (ref 26.0–34.0)
MCHC: 31.3 g/dL (ref 30.0–36.0)
MCV: 92.1 fL (ref 78.0–100.0)
Platelets: 286 10*3/uL (ref 150–400)
RBC: 3.02 MIL/uL — ABNORMAL LOW (ref 3.87–5.11)
RDW: 15.9 % — AB (ref 11.5–15.5)
WBC: 13.2 10*3/uL — ABNORMAL HIGH (ref 4.0–10.5)

## 2015-04-23 LAB — COMPREHENSIVE METABOLIC PANEL
ALT: 48 U/L (ref 14–54)
AST: 41 U/L (ref 15–41)
Albumin: 1.8 g/dL — ABNORMAL LOW (ref 3.5–5.0)
Alkaline Phosphatase: 126 U/L (ref 38–126)
Anion gap: 6 (ref 5–15)
BUN: 17 mg/dL (ref 6–20)
CHLORIDE: 105 mmol/L (ref 101–111)
CO2: 24 mmol/L (ref 22–32)
Calcium: 8.2 mg/dL — ABNORMAL LOW (ref 8.9–10.3)
Creatinine, Ser: 0.77 mg/dL (ref 0.44–1.00)
GFR calc Af Amer: >60 mL/min (ref >60)
Glucose, Bld: 200 mg/dL — ABNORMAL HIGH (ref 65–99)
POTASSIUM: 4.7 mmol/L (ref 3.5–5.1)
SODIUM: 135 mmol/L (ref 135–145)
Total Bilirubin: 1.3 mg/dL — ABNORMAL HIGH (ref 0.3–1.2)
Total Protein: 6.3 g/dL — ABNORMAL LOW (ref 6.5–8.1)

## 2015-04-23 LAB — GLUCOSE, CAPILLARY
GLUCOSE-CAPILLARY: 100 mg/dL — AB (ref 65–99)
GLUCOSE-CAPILLARY: 97 mg/dL (ref 65–99)
Glucose-Capillary: 174 mg/dL — ABNORMAL HIGH (ref 65–99)
Glucose-Capillary: 189 mg/dL — ABNORMAL HIGH (ref 65–99)
Glucose-Capillary: 92 mg/dL (ref 65–99)

## 2015-04-23 LAB — POCT I-STAT 4, (NA,K, GLUC, HGB,HCT)
GLUCOSE: 103 mg/dL — AB (ref 65–99)
HEMATOCRIT: 28 % — AB (ref 36.0–46.0)
HEMOGLOBIN: 9.5 g/dL — AB (ref 12.0–15.0)
Potassium: 4.4 mmol/L (ref 3.5–5.1)
SODIUM: 134 mmol/L — AB (ref 135–145)

## 2015-04-23 LAB — PHOSPHORUS: PHOSPHORUS: 2.9 mg/dL (ref 2.5–4.6)

## 2015-04-23 LAB — MAGNESIUM: MAGNESIUM: 1.8 mg/dL (ref 1.7–2.4)

## 2015-04-23 MED ORDER — PANTOPRAZOLE SODIUM 40 MG PO TBEC
40.0000 mg | DELAYED_RELEASE_TABLET | Freq: Every day | ORAL | Status: DC
  Administered 2015-04-23 – 2015-04-27 (×5): 40 mg via ORAL
  Filled 2015-04-23 (×5): qty 1

## 2015-04-23 NOTE — Progress Notes (Signed)
Utilization review complete. Kitty Cadavid RN CCM Case Mgmt phone [phone] 

## 2015-04-23 NOTE — Progress Notes (Signed)
Nutrition Follow-up  DOCUMENTATION CODES:   Morbid obesity  INTERVENTION:  -Continue Boost Breeze po BID, each supplement provides 250 kcal and 9 grams of protein -RD continue to monitor for needs -Pt is now on Reg Diet  NUTRITION DIAGNOSIS:   Inadequate oral intake related to inability to eat as evidenced by NPO status.  GOAL:   Patient will meet greater than or equal to 90% of their needs  MONITOR:   Vent status, Labs, Weight trends, Skin, I & O's  ASSESSMENT:   45 year old with history of asthma, arthritis, obesity. She had an elective admission today for L5 laminectomy. Intra-Op she went into shock, hemodynamic instability. Subsequent CT showed large left retroperitoneal hematoma. She was put on 2 pressors (norepi, epinephrine) and transferred to ICU. PCCM consulted for help with management. Vascular surgery was also consulted. Intra OP TEE showed normal cardiac function.  Kristina Estes is doing well. Says her appetite has returned. Now on regular diet, without TPN. She had grits this morning for breakfast. Had tuna for lunch but did not like it. Last incident of vomiting was Tuesday, no issues since. Denies nausea/vomiting. In general, does not like our food, but is trying everything sent to her. Per NP note, she is ready to tx from a surgical standpoint now.  Labs reviewed Medications: Fent PRN, Zofran PRN  Diet Order:  Diet regular Room service appropriate?: Yes; Fluid consistency:: Thin  Skin:  Wound (see comment) (abdominal wound VAC)  Last BM:  3/13  Height:   Ht Readings from Last 1 Encounters:  04/18/15 5\' 5"  (1.651 m)    Weight:   Wt Readings from Last 1 Encounters:  04/18/15 288 lb (130.636 kg)    Ideal Body Weight:  52.27 kg (kg)  BMI:  Body mass index is 47.93 kg/(m^2).  Estimated Nutritional Needs:   Kcal:  2100-2300  Protein:  130-140 gm  Fluid:  2.1-2.3 L  EDUCATION NEEDS:   No education needs identified at this time  Kristina AnoWilliam M.  Amalio Loe, MS, RD LDN After Hours/Weekend Pager [phone]

## 2015-04-23 NOTE — Care Management Note (Signed)
Case Management Note  Patient Details  Name: Kristina Estes MRN: [id number] Date of Birth: 07/19/1970  Subjective/Objective:   TLIF graft with cortical screws                 Action/Plan: Discharge Planning:  NCM spoke to pt and mother, Ms Kristina Estes at bedside. Pt [place]hab [place]. States her husband currently works full-time. Her mother will be caregiver until she has fully recovered. Provided information on [address] [place], contact [place] # [phone], fax [phone]. [facility] for IP rehab. Requested progress notes, PT notes and demographic. Contacted attending orders given to continue with IP rehab post dc. Faxed to [facility], to [employer] [facility]. Pt had questions about transport. Explained to pt and mother that pt will transport by [facility] [place]. Will fax dc summary and any updated notes to [facility].    Expected Discharge Date:                  Expected Discharge Plan:  IP Rehab Facility  In-House Referral:  Clinical Social Work  Discharge planning Services  CM Consult  Post Acute Care Choice:  NA Choice offered to:  NA  DME Arranged:  N/A DME Agency:  NA  HH Arranged:  NA HH Agency:  NA  Status of Service:  Completed, signed off  Medicare Important Message Given:    Date Medicare IM Given:    Medicare IM give by:    Date Additional Medicare IM Given:    Additional Medicare Important Message give by:     If discussed at Long Length of Stay Meetings, dates discussed:    Additional Comments:  Elliot CousinShavis, Jeniah Kishi Ellen, RN 04/23/2015, 2:44 PM

## 2015-04-23 NOTE — Progress Notes (Signed)
Physical Therapy Treatment/Re-Evaluation Patient Details Name: Kristina Estes MRN: [id number] DOB: 02-27-1970 Today's Date: 04/23/2015    History of Present Illness Pt admitted for surgery on 03/27/15, by Dr. Adam Phenix, for L5-S1 procedure; pt dropped her blood pressure and the surgery was discontinued and she was closed. She returned to the Neuro ICU and was transfused with 2 units of PRBC and requiring pressors when seen by Dr. Imogene Burn. She was hemodynamically unstable and taken back to the OR. She underwent Exploratory laparotomy control of bleeding and left common iliac to left common femoral bypass with 8 mm Hemashield graft, 03/27/15. In addition to this she got a Left common iliac artery to left common femoral artery bypass with 8 mm Dacron, and placement of abdominal negative pressure dressing, by Dr. Imogene Burn at that time also. On 03/29/15 Dr,. Imogene Burn took her back to the OR for abdominal washout, and new negative pressure dressing.  Underwent multiple return to OR for wound washout, evacuation of hematoma, and vac changes.  Developed DVT and underwent IVC filter placement.  Finally had wound closure on 04/15/15 and was extubated.  Pt is now s/p posterior instrumentation L5-S1.    PT Comments    Pt admitted with above diagnosis. Pt currently with functional limitations due to the deficits listed below (see PT Problem List). Kristina Estes currently requires min>mod assist for mobility and utilized RW to pivot to chair today.  She remains excellent candidate for CIR as she is very motivated to participate in therapy to regain her independence. Pt will benefit from skilled PT to increase their independence and safety with mobility to allow discharge to the venue listed below.     Follow Up Recommendations  CIR     Equipment Recommendations  Rolling walker with 5" wheels (Bariatric RW)    Recommendations for Other Services Rehab consult;OT consult     Precautions / Restrictions Precautions Precautions:  Fall;Back Precaution Booklet Issued: No Precaution Comments: abdominal binder Required Braces or Orthoses: Spinal Brace Spinal Brace: Lumbar corset;Applied in sitting position (no specific orders, applied in sitting this session) Restrictions Weight Bearing Restrictions: No    Mobility  Bed Mobility Overal bed mobility: Needs Assistance Bed Mobility: Rolling;Sidelying to Sit Rolling: Min assist Sidelying to sit: Mod assist;HOB elevated       General bed mobility comments: Cues for proper log roll to bring Bil LEs OOB before sitting up.  Assist to elevate trunk.  Pt uses bed rail w/ HOB elevated.  Transfers Overall transfer level: Needs assistance Equipment used: Rolling walker (2 wheeled) Transfers: Sit to/from [employer] Sit to Stand: Mod assist;+2 physical assistance;+2 safety/equipment Stand pivot transfers: Mod assist;+2 safety/equipment       General transfer comment: Assist to boost up and cues for hand placement and to stand upright, pt w/ flexed posture.  Assist manging RW during pivot to chair and w/ controlled descent to sit.  Ambulation/Gait             General Gait Details: not attempted as pt fatigues quickly and begins saying, "I'm going to fall" just before sitting in chair.   Stairs            Wheelchair Mobility    Modified Rankin (Stroke Patients Only)       Balance Overall balance assessment: Needs assistance Sitting-balance support: Bilateral upper extremity supported;Feet supported Sitting balance-Leahy Scale: Fair     Standing balance support: Bilateral upper extremity supported;During functional activity Standing balance-Leahy Scale: Poor Standing balance comment: Relies on RW  and outside assist for support                    Cognition Arousal/Alertness: Awake/alert Behavior During Therapy: [facility] for tasks assessed/performed Overall Cognitive Status: Within Functional Limits for tasks assessed                       Exercises General Exercises - Lower Extremity Ankle Circles/Pumps: AROM;Both;10 reps;Seated    General Comments General comments (skin integrity, edema, etc.): strength w/ MMT: Bil knee flexion/extension 4/5, Bil DF 5/5.  Sensation WNL Bil LEs.      Pertinent Vitals/Pain Pain Assessment: 0-10 Pain Score: 5  Pain Location: lower back Pain Descriptors / Indicators: Aching;Sore Pain Intervention(s): Limited activity within patient's tolerance;Monitored during session;Repositioned    Home Living Family/patient expects to be discharged to:: Inpatient rehab                    Prior Function            PT Goals (current goals can now be found in the care plan section) Acute Rehab PT Goals Patient Stated Goal: to sit in the chair today PT Goal Formulation: With patient Time For Goal Achievement: 05/14/15 Potential to Achieve Goals: Good Progress towards PT goals: Progressing toward goals    Frequency  Min 5X/week    PT Plan Current plan remains appropriate    Co-evaluation             End of Session Equipment Utilized During Treatment: Back brace Activity Tolerance: Patient limited by fatigue Patient left: in chair;with call bell/phone within reach     Time: 0907-0948 PT Time Calculation (min) (ACUTE ONLY): 41 min  Charges:  $Therapeutic Activity: 23-37 mins                    G Codes:      Encarnacion ChuAshley Zyshawn Bohnenkamp PT, DPT  Pager: [phone] Phone: [phone] 04/23/2015, 12:03 PM

## 2015-04-23 NOTE — Progress Notes (Signed)
No issues overnight. Pt was up to bedside chair this am, had some abd discomfort after about an hour. Reports different type of back pain than before her surgery.  EXAM:  BP 141/73 mmHg  Pulse 103  Temp(Src) 98 F (36.7 C) (Oral)  Resp 21  Ht 5\' 5"  (1.651 m)  Wt 130.636 kg (288 lb)  BMI 47.93 kg/m2  SpO2 99%  LMP 03/27/2015  Awake, alert, oriented  Speech fluent, appropriate  CN grossly intact  5/5 BUE/BLE   IMPRESSION:  45 y.o. female s/p lumbar fusion, ex-lap, and vascular repair. Now doing well  PLAN: - Will get periph IV and d/c PICC - D/C Foley - Cont PT/OT - Can transfer to floor

## 2015-04-23 NOTE — Progress Notes (Signed)
Patient ID: Kristina Estes, female   DOB: 07-26-1970, 45 y.o.   MRN: [id number]     [address] [place], [place] [address]    Phone: [phone] FAX: [phone]     Subjective: BM this AM.  Hungry. No n/v. luq drain 25m ruq drain 667mVss.  Afebrile.   Objective:  Vital signs:  Filed Vitals:   04/22/15 2120 04/22/15 2145 04/22/15 2345 04/23/15 0420  BP: 144/80 149/80 143/82 141/73  Pulse: 108 107 100 103  Temp:  98 F (36.7 C) 97.9 F (36.6 C) 98.8 F (37.1 C)  TempSrc:  Oral Oral Oral  Resp: _0 Height:      Weight:      SpO2: 100% 100% 100% 99%    Last BM Date: 04/20/15  Intake/Output   Yesterday:  03/15 0701 - 03/16 0700 In: 2024.7 [I.V.:720; TPN:1304.7] Out: 359767Urine:3300; Drains:77; Blood:200] This shift:    Physical Exam: General: Pt awake/alert/oriented x4 in no acute distress  Abdomen: Soft. Nondistended. Mildly tender at incisions only. midline incision, staples and sutures in place, no erythema. ruq and luq drains with sanguinous output. No evidence of peritonitis. No incarcerated hernias.   Problem List:   Active Problems:   Lumbar spondylolysis   Acute respiratory failure with hypoxia (HCC)   Hemorrhagic shock   Hypervolemia   Acute respiratory failure with hypercapnia (HCC)   Fever, unspecified   Pressure ulcer   Hematoma of abdominal wall   Acute respiratory failure (HCC)   Atelectasis   Encounter for imaging study to confirm nasogastric (NG) tube placement   Abdominal swelling   Spinal stenosis    Results:   Labs: Results for orders placed or performed during the hospital encounter of 03/27/15 (from the past 48 hour(s))  Glucose, capillary     Status: Abnormal   Collection Time: 04/21/15 11:07 AM  Result Value Ref Range   Glucose-Capillary 110 (H) 65 - 99 mg/dL   Comment 1 Notify RN    Comment 2 Document in Chart   Glucose, capillary      Status: Abnormal   Collection Time: 04/21/15  3:44 PM  Result Value Ref Range   Glucose-Capillary 101 (H) 65 - 99 mg/dL   Comment 1 Notify RN    Comment 2 Document in Chart   Glucose, capillary     Status: Abnormal   Collection Time: 04/21/15  8:06 PM  Result Value Ref Range   Glucose-Capillary 132 (H) 65 - 99 mg/dL  Glucose, capillary     Status: Abnormal   Collection Time: 04/21/15 11:35 PM  Result Value Ref Range   Glucose-Capillary 137 (H) 65 - 99 mg/dL  Glucose, capillary     Status: Abnormal   Collection Time: 04/22/15  4:07 AM  Result Value Ref Range   Glucose-Capillary 143 (H) 65 - 99 mg/dL  Glucose, capillary     Status: Abnormal   Collection Time: 04/22/15  8:03 AM  Result Value Ref Range   Glucose-Capillary 131 (H) 65 - 99 mg/dL   Comment 1 Notify RN    Comment 2 Document in Chart   Glucose, capillary     Status: Abnormal   Collection Time: 04/22/15 12:08 PM  Result Value Ref Range   Glucose-Capillary 101 (H) 65 - 99 mg/dL  Type and screen     Status: None (Preliminary result)   Collection Time: 04/22/15  5:30 PM  Result Value Ref Range   ABO/RH(D) O POS    Antibody Screen NEG    Sample Expiration 04/25/2015    Unit Number [id number]    Blood Component Type RED CELLS,LR    Unit division 00    Status of Unit ALLOCATED    Transfusion Status OK TO TRANSFUSE    Crossmatch Result Compatible    Unit Number [id number]    Blood Component Type RBC LR PHER1    Unit division 00    Status of Unit ALLOCATED    Transfusion Status OK TO TRANSFUSE    Crossmatch Result Compatible   Prepare RBC (crossmatch)     Status: None   Collection Time: 04/22/15  5:57 PM  Result Value Ref Range   Order Confirmation ORDER PROCESSED BY BLOOD BANK   Glucose, capillary     Status: Abnormal   Collection Time: 04/22/15  9:59 PM  Result Value Ref Range   Glucose-Capillary 109 (H) 65 - 99 mg/dL   Comment 1 Notify RN   Glucose, capillary     Status: Abnormal   Collection Time:  04/22/15 11:57 PM  Result Value Ref Range   Glucose-Capillary 189 (H) 65 - 99 mg/dL  CBC     Status: Abnormal   Collection Time: 04/23/15  2:20 AM  Result Value Ref Range   WBC 13.2 (H) 4.0 - 10.5 K/uL   RBC 3.02 (L) 3.87 - 5.11 MIL/uL   Hemoglobin 8.7 (L) 12.0 - 15.0 g/dL   HCT 27.8 (L) 36.0 - 46.0 %   MCV 92.1 78.0 - 100.0 fL   MCH 28.8 26.0 - 34.0 pg   MCHC 31.3 30.0 - 36.0 g/dL   RDW 15.9 (H) 11.5 - 15.5 %   Platelets 286 150 - 400 K/uL  Comprehensive metabolic panel     Status: Abnormal   Collection Time: 04/23/15  5:00 AM  Result Value Ref Range   Sodium 135 135 - 145 mmol/L   Potassium 4.7 3.5 - 5.1 mmol/L   Chloride 105 101 - 111 mmol/L   CO2 24 22 - 32 mmol/L   Glucose, Bld 200 (H) 65 - 99 mg/dL   BUN 17 6 - 20 mg/dL   Creatinine, Ser 0.77 0.44 - 1.00 mg/dL   Calcium 8.2 (L) 8.9 - 10.3 mg/dL   Total Protein 6.3 (L) 6.5 - 8.1 g/dL   Albumin 1.8 (L) 3.5 - 5.0 g/dL   AST 41 15 - 41 U/L   ALT 48 14 - 54 U/L   Alkaline Phosphatase 126 38 - 126 U/L   Total Bilirubin 1.3 (H) 0.3 - 1.2 mg/dL   GFR calc non Af Amer >60 >60 mL/min   GFR calc Af Amer >60 >60 mL/min    Comment: (NOTE) The eGFR has been calculated using the CKD EPI equation. This calculation has not been validated in all clinical situations. eGFR's persistently <60 mL/min signify possible Chronic Kidney Disease.    Anion gap 6 5 - 15  Magnesium     Status: None   Collection Time: 04/23/15  5:00 AM  Result Value Ref Range   Magnesium 1.8 1.7 - 2.4 mg/dL  Phosphorus     Status: None   Collection Time: 04/23/15  5:00 AM  Result Value Ref Range   Phosphorus 2.9 2.5 - 4.6 mg/dL    Imaging / Studies: Dg Lumbar Spine 2-3 Views  04/22/2015  CLINICAL DATA:  L5-S1 TLIF EXAM: DG C-ARM 61-120 MIN; LUMBAR SPINE - 2-3 VIEW COMPARISON:  None. FLUOROSCOPY TIME:  Radiation Exposure Index (as provided by the fluoroscopic device): Not available If the device does not provide the exposure index: Fluoroscopy Time:  30  seconds Number of Acquired Images:  2 FINDINGS: Pedicle screws are noted at L5 and S1 with interbody fusion and posterior fixation. IMPRESSION: L5-S1 TLIF Electronically Signed   By: Inez Catalina M.D.   On: 04/22/2015 19:57   Dg C-arm 1-60 Min  04/22/2015  CLINICAL DATA:  L5-S1 TLIF EXAM: DG C-ARM 61-120 MIN; LUMBAR SPINE - 2-3 VIEW COMPARISON:  None. FLUOROSCOPY TIME:  Radiation Exposure Index (as provided by the fluoroscopic device): Not available If the device does not provide the exposure index: Fluoroscopy Time:  30 seconds Number of Acquired Images:  2 FINDINGS: Pedicle screws are noted at L5 and S1 with interbody fusion and posterior fixation. IMPRESSION: L5-S1 TLIF Electronically Signed   By: Inez Catalina M.D.   On: 04/22/2015 19:57    Medications / Allergies:  Scheduled Meds: . antiseptic oral rinse  7 mL Mouth Rinse q12n4p  . antiseptic oral rinse  7 mL Mouth Rinse QID  . chlorhexidine  15 mL Mouth Rinse BID  . feeding supplement  1 Container Oral BID BM  . fentaNYL      . insulin aspart  0-20 Units Subcutaneous 6 times per day  . metoCLOPramide (REGLAN) injection  10 mg Intravenous 4 times per day  . pantoprazole (PROTONIX) IV  40 mg Intravenous Q24H  . sodium chloride flush  10-40 mL Intracatheter Q12H  . zolpidem  5 mg Oral QHS   Continuous Infusions: . sodium chloride 10 mL/hr at 04/13/15 0600   PRN Meds:.fentaNYL (SUBLIMAZE) injection, methocarbamol, ondansetron (ZOFRAN) IV, oxyCODONE-acetaminophen, sodium chloride flush  Antibiotics: Anti-infectives    Start     Dose/Rate Route Frequency Ordered Stop   04/22/15 1835  bacitracin 50,000 Units in sodium chloride irrigation 0.9 % 500 mL irrigation  Status:  Discontinued       As needed 04/22/15 1835 04/22/15 2012   04/15/15 0930  polymyxin B 500,000 Units, bacitracin 50,000 Units in sodium chloride irrigation 0.9 % 500 mL irrigation  Status:  Discontinued       As needed 04/15/15 0930 04/15/15 0959   04/13/15 2230   vancomycin (VANCOCIN) IVPB 1000 mg/200 mL premix  Status:  Discontinued     1,000 mg 200 mL/hr over 60 Minutes Intravenous Every 12 hours 04/13/15 0948 04/16/15 1007   04/13/15 1200  imipenem-cilastatin (PRIMAXIN) 500 mg in sodium chloride 0.9 % 100 mL IVPB  Status:  Discontinued     500 mg 200 mL/hr over 30 Minutes Intravenous 4 times per day 04/13/15 0946 04/16/15 1007   04/13/15 1000  ceFAZolin (ANCEF) IVPB 2 g/50 mL premix  Status:  Discontinued     2 g 100 mL/hr over 30 Minutes Intravenous 3 times per day 04/13/15 0901 04/13/15 0936   04/13/15 1000  vancomycin (VANCOCIN) 2,500 mg in sodium chloride 0.9 % 500 mL IVPB     2,500 mg 250 mL/hr over 120 Minutes Intravenous  Once 04/13/15 0948 04/13/15 1405   04/02/15 2330  vancomycin (VANCOCIN) IVPB 1000 mg/200 mL premix  Status:  Discontinued     1,000 mg 200 mL/hr over 60 Minutes Intravenous Every 12 hours 04/02/15 1015 04/05/15 0849   04/02/15 1830  piperacillin-tazobactam (ZOSYN) IVPB 3.375 g  Status:  Discontinued     3.375 g 12.5 mL/hr over 240 Minutes Intravenous 3 times per day 04/02/15 1015 04/09/15 0804  04/02/15 1100  cefUROXime (ZINACEF) 1.5 g in dextrose 5 % 50 mL IVPB  Status:  Discontinued     1.5 g 100 mL/hr over 30 Minutes Intravenous To ShortStay Surgical 04/01/15 1106 04/02/15 1448   04/02/15 1100  vancomycin (VANCOCIN) 2,500 mg in sodium chloride 0.9 % 500 mL IVPB     2,500 mg 250 mL/hr over 120 Minutes Intravenous  Once 04/02/15 1029 04/02/15 1342   04/02/15 1030  vancomycin (VANCOCIN) 2,250 mg in sodium chloride 0.9 % 500 mL IVPB  Status:  Discontinued     2,250 mg 250 mL/hr over 120 Minutes Intravenous  Once 04/02/15 1015 04/02/15 1029   04/02/15 1030  piperacillin-tazobactam (ZOSYN) IVPB 3.375 g     3.375 g 100 mL/hr over 30 Minutes Intravenous  Once 04/02/15 1015 04/02/15 1212   03/27/15 2130  cefUROXime (ZINACEF) 1.5 g in dextrose 5 % 50 mL IVPB  Status:  Discontinued     1.5 g 100 mL/hr over 30 Minutes  Intravenous Every 12 hours 03/27/15 2124 03/27/15 2132   03/27/15 1600  ceFAZolin (ANCEF) IVPB 2 g/50 mL premix     2 g 100 mL/hr over 30 Minutes Intravenous Every 8 hours 03/27/15 1322 03/28/15 0759   03/27/15 1447  ceFAZolin (ANCEF) 1-5 GM-% IVPB    Comments:  Claybon Jabs   : cabinet override      03/27/15 1447 03/28/15 0259   03/27/15 0800  bacitracin 50,000 Units in sodium chloride irrigation 0.9 % 500 mL irrigation  Status:  Discontinued       As needed 03/27/15 0943 03/27/15 1223   03/27/15 0700  ceFAZolin (ANCEF) 3 g in dextrose 5 % 50 mL IVPB     3 g 130 mL/hr over 30 Minutes Intravenous To ShortStay Surgical 03/26/15 1409 03/27/15 0750        Assessment/Plan S/p L5 laminectomy (2/17/7 - Dr. Kathyrn Sheriff)  Left iliac vein and left common iliac artery bleeding  S/p Ex lap, repair of left iliac vein, ligation of distal left common iliac artery, left common iliac artery to left common femoral bypass with 14m Dacron, placement of abdominal negative pressure dressing (03/27/15 - Early and CBridgett Larsson  Subsequently 4 abdominal washouts and wound vac changes 2/19, 2/21, 2/23, 2/27 (Bridgett Larsson s/p exploratory laparotomy, abdominal wound closure with Phasix mesh, drain placement(Dr. RRosendo Gros  Post-operative abdominal wall hematoma s/p Exploration of abdominal wound, evacuation of hematoma, placement of negative pressure dressing ([facility])(04/13/2015)  POD#8 wound exploration, evacuation of residual hematoma, closure of sking and subq tissue(Dr. IDalbert Batman -advance diet -DC left drain only, continue right sided drain -mobilize per NSU -PT/OT per NSU -would recommend DC foley  -continue with staples(remove POD#14)  POD#1 L5-S1 screws  PCM-DC TPN Prealbumin 20(3/13) ABLA-stable  Left common iliac DVT-s/p IVC filter  Disp -can transfer out from a surgical standpoint    EErby Pian [facility]Car[facility] Center[facility]gery Pager [phone](7A-4:30P) For consults and floor pages call  [phone](7A-4:30P)  04/23/2015 7:58 AM

## 2015-04-24 LAB — GLUCOSE, CAPILLARY
Glucose-Capillary: 111 mg/dL — ABNORMAL HIGH (ref 65–99)
Glucose-Capillary: 114 mg/dL — ABNORMAL HIGH (ref 65–99)
Glucose-Capillary: 99 mg/dL (ref 65–99)

## 2015-04-24 MED ORDER — IBUPROFEN 200 MG PO TABS
800.0000 mg | ORAL_TABLET | Freq: Three times a day (TID) | ORAL | Status: DC | PRN
  Administered 2015-04-24 – 2015-04-27 (×2): 800 mg via ORAL
  Filled 2015-04-24 (×2): qty 4

## 2015-04-24 NOTE — Progress Notes (Signed)
Physical Therapy Treatment Patient Details Name: Kristina Estes MRN: [id number] DOB: 02/23/1970 Today's Date: 04/24/2015    History of Present Illness Pt admitted for surgery on 03/27/15, by Dr. Adam PhenixNundk, for L5-S1 procedure; pt dropped her blood pressure and the surgery was discontinued and she was closed. She returned to the Neuro ICU and was transfused with 2 units of PRBC and requiring pressors when seen by Dr. Imogene Burnhen. She was hemodynamically unstable and taken back to the OR. She underwent Exploratory laparotomy control of bleeding and left common iliac to left common femoral bypass with 8 mm Hemashield graft, 03/27/15. In addition to this she got a Left common iliac artery to left common femoral artery bypass with 8 mm Dacron, and placement of abdominal negative pressure dressing, by Dr. Imogene Burnhen at that time also. On 03/29/15 Dr,. Imogene BurnChen took her back to the OR for abdominal washout, and new negative pressure dressing.  Underwent multiple return to OR for wound washout, evacuation of hematoma, and vac changes.  Developed DVT and underwent IVC filter placement.  Finally had wound closure on 04/15/15 and was extubated.  Pt is now s/p posterior instrumentation L5-S1.    PT Comments    Patient making improvements with bed mobility and transfers.  Agree with need for Inpatient Rehab stay prior to discharge.  Follow Up Recommendations  CIR;Supervision/Assistance - 24 hour (Patient planning to go to Rehab in [place] near mom.)     Equipment Recommendations  Rolling walker with 5" wheels (Bariatric RW)    Recommendations for Other Services Rehab consult;OT consult     Precautions / Restrictions Precautions Precautions: Fall;Back Precaution Booklet Issued: No Precaution Comments: abdominal binder Required Braces or Orthoses: Spinal Brace Spinal Brace: Lumbar corset;Applied in sitting position Restrictions Weight Bearing Restrictions: No    Mobility  Bed Mobility Overal bed mobility: Needs  Assistance Bed Mobility: Rolling;Sidelying to Sit Rolling: Min guard Sidelying to sit: Mod assist;+2 for safety/equipment       General bed mobility comments: Patient able to use rails to roll to both sides to don abdominal binder.  In sidelying, patient moved to sitting with assist to raise trunk to upright position.  Mod assist to don back brace.  Transfers Overall transfer level: Needs assistance Equipment used: 2 person hand held assist Transfers: Sit to/from [employer] CorporationStand;Stand Pivot Transfers Sit to Stand: Min assist;+2 physical assistance Stand pivot transfers: Min assist;+2 physical assistance       General transfer comment: Verbal cues for technique.  Assist to steady during transfer to standing.  Stood for a minute to decrease dizziness and maintain balance.  Patient able to take several steps to pivot to w/c.  Cues for hand placement and to control descent into chair.  Ambulation/Gait                 Stairs            Wheelchair Mobility    Modified Rankin (Stroke Patients Only)       Balance           Standing balance support: Single extremity supported Standing balance-Leahy Scale: Poor                      Cognition Arousal/Alertness: Awake/alert Behavior During Therapy: WFL for tasks assessed/performed Overall Cognitive Status: Within Functional Limits for tasks assessed                      Exercises      General Comments  Pertinent Vitals/Pain Pain Assessment: 0-10 Pain Score: 4  Pain Location: back, abdomen Pain Descriptors / Indicators: Aching;Sore Pain Intervention(s): Limited activity within patient's tolerance;Monitored during session;Repositioned    Home Living                      Prior Function            PT Goals (current goals can now be found in the care plan section) Progress towards PT goals: Progressing toward goals    Frequency  Min 5X/week    PT Plan Current plan remains  appropriate    Co-evaluation             End of Session Equipment Utilized During Treatment: Back brace (Abdominal binder) Activity Tolerance: Patient limited by fatigue Patient left: in chair;with call bell/phone within reach;with chair alarm set;with family/visitor present     Time: 9562-1308 PT Time Calculation (min) (ACUTE ONLY): 23 min  Charges:  $Therapeutic Activity: 23-37 mins                    G Codes:      Vena Austria May 08, 2015, 8:41 PM Durenda Hurt. Renaldo Fiddler, [facility] Acute Rehab Services Pager [phone]

## 2015-04-24 NOTE — Progress Notes (Signed)
Patient arrived to [id number]. Alert and oriented x4. Family at bedside. Pt placed on tele box 5C09. Call bell within reach. Will continue to monitor.

## 2015-04-24 NOTE — Progress Notes (Signed)
Report called to Sam, RN, for 5 C room 9.

## 2015-04-24 NOTE — Progress Notes (Signed)
No issues overnight. Was able to sit in chair longer today, better able to transfer. Back pain well controlled.  EXAM:  BP 122/77 mmHg  Pulse 118  Temp(Src) 98.6 F (37 C) (Oral)  Resp 27  Ht 5\' 5"  (1.651 m)  Wt 130.636 kg (288 lb)  BMI 47.93 kg/m2  SpO2 100%  LMP 03/27/2015  Awake, alert, oriented  Speech fluent, appropriate  CN grossly intact  5/5 BUE/BLE   IMPRESSION:  45 y.o. female s/p lumbar fusion, iliac repair, abd closure. Doing well  PLAN: - Cont PT/OT - Plan on [place] to CIR [place] once bed available.

## 2015-04-24 NOTE — Progress Notes (Signed)
Patient ID: Kristina Estes, female   DOB: 1970-06-07, 45 y.o.   MRN: [id number]     [facility]      [address] [place], [place] [address]    Phone: [phone] FAX: [phone]     Subjective: Tolerating POs.  No n/v.  Got up with PT. 22m drain output.  Objective:  Vital signs:  Filed Vitals:   04/23/15 1943 04/23/15 2358 04/24/15 0430 04/24/15 0800  BP: 117/63 125/59 117/60 122/65  Pulse:  116 110 102  Temp:  98.8 F (37.1 C) 98.7 F (37.1 C) 97.8 F (36.6 C)  TempSrc:  Oral Oral Oral  Resp: _0 Height:      Weight:      SpO2: 100% 100% 100% 100%    Last BM Date: 04/20/15  Intake/Output   Yesterday:  03/16 0701 - 03/17 0700 In: -  Out: 860 [Urine:850; Drains:10] This shift:  Total I/O In: -  Out: 25 [Drains:25]     Physical Exam: General: Pt awake/alert/oriented x4 in no acute distress  Abdomen: Soft. Nondistended. non tender.  midline incision, staples and sutures in place, no erythema. ruq drain with sanguinous output. No evidence of peritonitis. No incarcerated hernias.   Problem List:   Active Problems:   Lumbar spondylolysis   Acute respiratory failure with hypoxia (HCC)   Hemorrhagic shock   Hypervolemia   Acute respiratory failure with hypercapnia (HCC)   Fever, unspecified   Pressure ulcer   Hematoma of abdominal wall   Acute respiratory failure (HCC)   Atelectasis   Encounter for imaging study to confirm nasogastric (NG) tube placement   Abdominal swelling   Spinal stenosis    Results:   Labs: Results for orders placed or performed during the hospital encounter of 03/27/15 (from the past 48 hour(s))  Glucose, capillary     Status: Abnormal   Collection Time: 04/22/15 12:08 PM  Result Value Ref Range   Glucose-Capillary 101 (H) 65 - 99 mg/dL  Type and screen     Status: None (Preliminary result)   Collection Time: 04/22/15  5:30 PM  Result Value Ref Range    ABO/RH(D) O POS    Antibody Screen NEG    Sample Expiration 04/25/2015    Unit Number [id number]   Blood Component Type RED CELLS,LR    Unit division 00    Status of Unit ALLOCATED    Transfusion Status OK TO TRANSFUSE    Crossmatch Result Compatible    Unit Number [id number]   Blood Component Type RBC LR PHER1    Unit division 00    Status of Unit ALLOCATED    Transfusion Status OK TO TRANSFUSE    Crossmatch Result Compatible   I-STAT 4, (NA,K, GLUC, HGB,HCT)     Status: Abnormal   Collection Time: 04/22/15  5:32 PM  Result Value Ref Range   Sodium 134 (L) 135 - 145 mmol/L   Potassium 4.4 3.5 - 5.1 mmol/L   Glucose, Bld 103 (H) 65 - 99 mg/dL   HCT 28.0 (L) 36.0 - 46.0 %   Hemoglobin 9.5 (L) 12.0 - 15.0 g/dL  Prepare RBC (crossmatch)     Status: None   Collection Time: 04/22/15  5:57 PM  Result Value Ref Range   Order Confirmation ORDER PROCESSED BY BLOOD BANK   Glucose, capillary     Status: Abnormal   Collection Time: 04/22/15  9:59 PM  Result Value Ref  Range   Glucose-Capillary 109 (H) 65 - 99 mg/dL   Comment 1 Notify RN   Glucose, capillary     Status: Abnormal   Collection Time: 04/22/15 11:57 PM  Result Value Ref Range   Glucose-Capillary 189 (H) 65 - 99 mg/dL  CBC     Status: Abnormal   Collection Time: 04/23/15  2:20 AM  Result Value Ref Range   WBC 13.2 (H) 4.0 - 10.5 K/uL   RBC 3.02 (L) 3.87 - 5.11 MIL/uL   Hemoglobin 8.7 (L) 12.0 - 15.0 g/dL   HCT 27.8 (L) 36.0 - 46.0 %   MCV 92.1 78.0 - 100.0 fL   MCH 28.8 26.0 - 34.0 pg   MCHC 31.3 30.0 - 36.0 g/dL   RDW 15.9 (H) 11.5 - 15.5 %   Platelets 286 150 - 400 K/uL  Glucose, capillary     Status: Abnormal   Collection Time: 04/23/15  4:20 AM  Result Value Ref Range   Glucose-Capillary 174 (H) 65 - 99 mg/dL   Comment 1 QC Due   Comprehensive metabolic panel     Status: Abnormal   Collection Time: 04/23/15  5:00 AM  Result Value Ref Range   Sodium 135 135 - 145 mmol/L   Potassium 4.7 3.5 - 5.1  mmol/L   Chloride 105 101 - 111 mmol/L   CO2 24 22 - 32 mmol/L   Glucose, Bld 200 (H) 65 - 99 mg/dL   BUN 17 6 - 20 mg/dL   Creatinine, Ser 0.77 0.44 - 1.00 mg/dL   Calcium 8.2 (L) 8.9 - 10.3 mg/dL   Total Protein 6.3 (L) 6.5 - 8.1 g/dL   Albumin 1.8 (L) 3.5 - 5.0 g/dL   AST 41 15 - 41 U/L   ALT 48 14 - 54 U/L   Alkaline Phosphatase 126 38 - 126 U/L   Total Bilirubin 1.3 (H) 0.3 - 1.2 mg/dL   GFR calc non Af Amer >60 >60 mL/min   GFR calc Af Amer >60 >60 mL/min    Comment: (NOTE) The eGFR has been calculated using the CKD EPI equation. This calculation has not been validated in all clinical situations. eGFR's persistently <60 mL/min signify possible Chronic Kidney Disease.    Anion gap 6 5 - 15  Magnesium     Status: None   Collection Time: 04/23/15  5:00 AM  Result Value Ref Range   Magnesium 1.8 1.7 - 2.4 mg/dL  Phosphorus     Status: None   Collection Time: 04/23/15  5:00 AM  Result Value Ref Range   Phosphorus 2.9 2.5 - 4.6 mg/dL  Glucose, capillary     Status: Abnormal   Collection Time: 04/23/15  1:35 PM  Result Value Ref Range   Glucose-Capillary 100 (H) 65 - 99 mg/dL   Comment 1 Notify RN    Comment 2 Document in Chart   Glucose, capillary     Status: None   Collection Time: 04/23/15  4:52 PM  Result Value Ref Range   Glucose-Capillary 92 65 - 99 mg/dL  Glucose, capillary     Status: None   Collection Time: 04/23/15  7:34 PM  Result Value Ref Range   Glucose-Capillary 97 65 - 99 mg/dL  Glucose, capillary     Status: None   Collection Time: 04/23/15 11:57 PM  Result Value Ref Range   Glucose-Capillary 99 65 - 99 mg/dL  Glucose, capillary     Status: Abnormal   Collection Time: 04/24/15  4:26  AM  Result Value Ref Range   Glucose-Capillary 111 (H) 65 - 99 mg/dL  Glucose, capillary     Status: Abnormal   Collection Time: 04/24/15  8:10 AM  Result Value Ref Range   Glucose-Capillary 114 (H) 65 - 99 mg/dL    Imaging / Studies: Dg Lumbar Spine 2-3  Views  04/22/2015  CLINICAL DATA:  L5-S1 TLIF EXAM: DG C-ARM 61-120 MIN; LUMBAR SPINE - 2-3 VIEW COMPARISON:  None. FLUOROSCOPY TIME:  Radiation Exposure Index (as provided by the fluoroscopic device): Not available If the device does not provide the exposure index: Fluoroscopy Time:  30 seconds Number of Acquired Images:  2 FINDINGS: Pedicle screws are noted at L5 and S1 with interbody fusion and posterior fixation. IMPRESSION: L5-S1 TLIF Electronically Signed   By: Inez Catalina M.D.   On: 04/22/2015 19:57   Dg C-arm 1-60 Min  04/22/2015  CLINICAL DATA:  L5-S1 TLIF EXAM: DG C-ARM 61-120 MIN; LUMBAR SPINE - 2-3 VIEW COMPARISON:  None. FLUOROSCOPY TIME:  Radiation Exposure Index (as provided by the fluoroscopic device): Not available If the device does not provide the exposure index: Fluoroscopy Time:  30 seconds Number of Acquired Images:  2 FINDINGS: Pedicle screws are noted at L5 and S1 with interbody fusion and posterior fixation. IMPRESSION: L5-S1 TLIF Electronically Signed   By: Inez Catalina M.D.   On: 04/22/2015 19:57    Medications / Allergies:  Scheduled Meds: . feeding supplement  1 Container Oral BID BM  . insulin aspart  0-20 Units Subcutaneous 6 times per day  . metoCLOPramide (REGLAN) injection  10 mg Intravenous 4 times per day  . pantoprazole  40 mg Oral Daily  . sodium chloride flush  10-40 mL Intracatheter Q12H  . zolpidem  5 mg Oral QHS   Continuous Infusions: . sodium chloride 10 mL/hr at 04/13/15 0600   PRN Meds:.fentaNYL (SUBLIMAZE) injection, ibuprofen, methocarbamol, ondansetron (ZOFRAN) IV, oxyCODONE-acetaminophen, sodium chloride flush  Antibiotics: Anti-infectives    Start     Dose/Rate Route Frequency Ordered Stop   04/22/15 1835  bacitracin 50,000 Units in sodium chloride irrigation 0.9 % 500 mL irrigation  Status:  Discontinued       As needed 04/22/15 1835 04/22/15 2012   04/15/15 0930  polymyxin B 500,000 Units, bacitracin 50,000 Units in sodium chloride  irrigation 0.9 % 500 mL irrigation  Status:  Discontinued       As needed 04/15/15 0930 04/15/15 0959   04/13/15 2230  vancomycin (VANCOCIN) IVPB 1000 mg/200 mL premix  Status:  Discontinued     1,000 mg 200 mL/hr over 60 Minutes Intravenous Every 12 hours 04/13/15 0948 04/16/15 1007   04/13/15 1200  imipenem-cilastatin (PRIMAXIN) 500 mg in sodium chloride 0.9 % 100 mL IVPB  Status:  Discontinued     500 mg 200 mL/hr over 30 Minutes Intravenous 4 times per day 04/13/15 0946 04/16/15 1007   04/13/15 1000  ceFAZolin (ANCEF) IVPB 2 g/50 mL premix  Status:  Discontinued     2 g 100 mL/hr over 30 Minutes Intravenous 3 times per day 04/13/15 0901 04/13/15 0936   04/13/15 1000  vancomycin (VANCOCIN) 2,500 mg in sodium chloride 0.9 % 500 mL IVPB     2,500 mg 250 mL/hr over 120 Minutes Intravenous  Once 04/13/15 0948 04/13/15 1405   04/02/15 2330  vancomycin (VANCOCIN) IVPB 1000 mg/200 mL premix  Status:  Discontinued     1,000 mg 200 mL/hr over 60 Minutes Intravenous Every 12 hours 04/02/15  1015 04/05/15 0849   04/02/15 1830  piperacillin-tazobactam (ZOSYN) IVPB 3.375 g  Status:  Discontinued     3.375 g 12.5 mL/hr over 240 Minutes Intravenous 3 times per day 04/02/15 1015 04/09/15 0804   04/02/15 1100  cefUROXime (ZINACEF) 1.5 g in dextrose 5 % 50 mL IVPB  Status:  Discontinued     1.5 g 100 mL/hr over 30 Minutes Intravenous To [facility] 04/01/15 1106 04/02/15 1448   04/02/15 1100  vancomycin (VANCOCIN) 2,500 mg in sodium chloride 0.9 % 500 mL IVPB     2,500 mg 250 mL/hr over 120 Minutes Intravenous  Once 04/02/15 1029 04/02/15 1342   04/02/15 1030  vancomycin (VANCOCIN) 2,250 mg in sodium chloride 0.9 % 500 mL IVPB  Status:  Discontinued     2,250 mg 250 mL/hr over 120 Minutes Intravenous  Once 04/02/15 1015 04/02/15 1029   04/02/15 1030  piperacillin-tazobactam (ZOSYN) IVPB 3.375 g     3.375 g 100 mL/hr over 30 Minutes Intravenous  Once 04/02/15 1015 04/02/15 1212   03/27/15 2130   cefUROXime (ZINACEF) 1.5 g in dextrose 5 % 50 mL IVPB  Status:  Discontinued     1.5 g 100 mL/hr over 30 Minutes Intravenous Every 12 hours 03/27/15 2124 03/27/15 2132   03/27/15 1600  ceFAZolin (ANCEF) IVPB 2 g/50 mL premix     2 g 100 mL/hr over 30 Minutes Intravenous Every 8 hours 03/27/15 1322 03/28/15 0759   03/27/15 1447  ceFAZolin (ANCEF) 1-5 GM-% IVPB    Comments:  Claybon Jabs   : cabinet override      03/27/15 1447 03/28/15 0259   03/27/15 0800  bacitracin 50,000 Units in sodium chloride irrigation 0.9 % 500 mL irrigation  Status:  Discontinued       As needed 03/27/15 0943 03/27/15 1223   03/27/15 0700  ceFAZolin (ANCEF) 3 g in dextrose 5 % 50 mL IVPB     3 g 130 mL/hr over 30 Minutes Intravenous To [facility] 03/26/15 1409 03/27/15 0750       Assessment/Plan S/p L5 laminectomy (2/17/7 - Dr. Kathyrn Sheriff)  Left iliac vein and left common iliac artery bleeding  S/p Ex lap, repair of left iliac vein, ligation of distal left common iliac artery, left common iliac artery to left common femoral bypass with 19m Dacron, placement of abdominal negative pressure dressing (03/27/15 - Early and CBridgett Larsson  Subsequently 4 abdominal washouts and wound vac changes 2/19, 2/21, 2/23, 2/27 (Bridgett Larsson s/p exploratory laparotomy, abdominal wound closure with Phasix mesh, drain placement(Dr. RRosendo Gros  Post-operative abdominal wall hematoma s/p Exploration of abdominal wound, evacuation of hematoma, placement of negative pressure dressing (Ingram)(04/13/2015)  POD#9 wound exploration, evacuation of residual hematoma, closure of sking and subq tissue(Dr. IDalbert Batman -tolerating diet, pain controlled, having bowel function -can likely DC RUQ drain, will check with Dr. TGeorgette Dover -abd binder with activity  -staples and sutures to be removed on 3/22 and steri strips applied   POD#2 L5-S1 screws  PCM-DC TPN Prealbumin 20(3/13) ABLA-stable  Left common iliac DVT-s/p IVC filter  Disp -stable for  discharge from a surgical standpoint.  PT recs CIR  EErby Pian [facility] Pager [phone] For consults and floor pages call [phone](7A-4:30P)  04/24/2015 10:10 AM

## 2015-04-24 NOTE — Anesthesia Postprocedure Evaluation (Signed)
Anesthesia Post Note  Patient: Kristina BoschLaverne Belshe  Procedure(s) Performed: Procedure(s) (LRB): LUMBAR FIVE-SACRAL ONE TRANSFORAMINAL LUMBAR INTERBODY FUSION WITH PEDICLE SCREW FIXATION (N/A)  Patient location during evaluation: PACU Anesthesia Type: General Level of consciousness: awake Pain management: pain level controlled Respiratory status: spontaneous breathing, respiratory function stable and patient connected to nasal cannula oxygen Cardiovascular status: stable Postop Assessment: no signs of nausea or vomiting Anesthetic complications: no    Last Vitals:  Filed Vitals:   04/23/15 2358 04/24/15 0430  BP: 125/59 117/60  Pulse: 116 110  Temp: 37.1 C 37.1 C  Resp: 23 27    Last Pain:  Filed Vitals:   04/24/15 0700  PainSc: 6                  Tali Coster

## 2015-04-24 NOTE — Progress Notes (Signed)
JP drain removed.  Patient tolerated well.  Area dressed with dry gauze.  Left groin dressing changed.  Area small, shallow 100% pink.  Area dressed with sterile saline moistened gauze, covered with dry gauze.  Patient stated she sent the IV nurse away earlier when the RN attempted to start a peripheral IV for her.

## 2015-04-25 LAB — BLOOD PRODUCT ORDER (VERBAL) VERIFICATION

## 2015-04-25 MED ORDER — METOCLOPRAMIDE HCL 10 MG PO TABS
10.0000 mg | ORAL_TABLET | Freq: Three times a day (TID) | ORAL | Status: DC
  Administered 2015-04-25 – 2015-04-28 (×8): 10 mg via ORAL
  Filled 2015-04-25 (×10): qty 1

## 2015-04-25 NOTE — Progress Notes (Signed)
Patient ID: Kristina Estes, female   DOB: 05/10/1970, 45 y.o.   MRN: [id number] Subjective:  The patient is alert and pleasant. She is in no apparent distress. She has ambulated to the restroom.  Objective: Vital signs in last 24 hours: Temp:  [98.2 F (36.8 C)-99.6 F (37.6 C)] 98.3 F (36.8 C) (03/18 0459) Pulse Rate:  [98-122] 98 (03/18 0459) Resp:  [18-27] 18 (03/18 0459) BP: (113-151)/(53-92) 116/63 mmHg (03/18 0459) SpO2:  [99 %-100 %] 100 % (03/18 0459)  Intake/Output from previous day: 03/17 0701 - 03/18 0700 In: 615 [P.O.:600; I.V.:15] Out: 282.5 [Urine:250; Drains:32.5] Intake/Output this shift: Total I/O In: 100 [P.O.:100] Out: -   Physical exam the patient is alert and pleasant. She is moving her lower extremities well.  Lab Results:  Recent Labs  04/22/15 1732 04/23/15 0220  WBC  --  13.2*  HGB 9.5* 8.7*  HCT 28.0* 27.8*  PLT  --  286   BMET  Recent Labs  04/22/15 1732 04/23/15 0500  NA 134* 135  K 4.4 4.7  CL  --  105  CO2  --  24  GLUCOSE 103* 200*  BUN  --  17  CREATININE  --  0.77  CALCIUM  --  8.2*    Studies/Results: No results found.  Assessment/Plan: Postop day #3: The patient is doing well. We will continue to mobilize her.  LOS: 29 days     Kristina Estes D 04/25/2015, 10:09 AM

## 2015-04-26 LAB — TYPE AND SCREEN
ABO/RH(D): O POS
Antibody Screen: NEGATIVE
UNIT DIVISION: 0
Unit division: 0

## 2015-04-26 LAB — GLUCOSE, CAPILLARY: Glucose-Capillary: 101 mg/dL — ABNORMAL HIGH (ref 65–99)

## 2015-04-26 MED ORDER — GLYCERIN (LAXATIVE) 2.1 G RE SUPP
1.0000 | RECTAL | Status: DC | PRN
  Administered 2015-04-26: 1 via RECTAL
  Filled 2015-04-26 (×2): qty 1

## 2015-04-26 MED ORDER — POLYETHYLENE GLYCOL 3350 17 G PO PACK
17.0000 g | PACK | Freq: Every day | ORAL | Status: DC
  Administered 2015-04-26 – 2015-04-28 (×3): 17 g via ORAL
  Filled 2015-04-26 (×3): qty 1

## 2015-04-26 MED ORDER — DOCUSATE SODIUM 100 MG PO CAPS
100.0000 mg | ORAL_CAPSULE | Freq: Two times a day (BID) | ORAL | Status: DC
  Administered 2015-04-26 – 2015-04-27 (×4): 100 mg via ORAL
  Filled 2015-04-26 (×5): qty 1

## 2015-04-26 NOTE — Progress Notes (Signed)
Patient c/o abdominal discomfort due to LBM 04/20/15. Colace, myralax x 2 in 12 hour period with no results. Order placed for glycerin suppository pending. RN will continue to monitor.

## 2015-04-26 NOTE — Progress Notes (Signed)
Patient ID: Kristina Estes, female   DOB: 10/10/1970, 45 y.o.   MRN: [id number] Subjective:  The patient is alert and pleasant. She is in no apparent distress. She has been mobilizing.  Objective: Vital signs in last 24 hours: Temp:  [98.2 F (36.8 C)-98.7 F (37.1 C)] 98.6 F (37 C) (03/19 0548) Pulse Rate:  [104-115] 105 (03/19 0548) Resp:  [18-20] 20 (03/19 0548) BP: (122-130)/(56-79) 130/79 mmHg (03/19 0548) SpO2:  [97 %-100 %] 97 % (03/19 0548)  Intake/Output from previous day: 03/18 0701 - 03/19 0700 In: 110 [P.O.:100; I.V.:10] Out: -  Intake/Output this shift:    Physical exam the patient is alert and oriented. She is moving her lower extremities well.  Lab Results: No results for input(s): WBC, HGB, HCT, PLT in the last 72 hours. BMET No results for input(s): NA, K, CL, CO2, GLUCOSE, BUN, CREATININE, CALCIUM in the last 72 hours.  Studies/Results: No results found.  Assessment/Plan: The patient is doing well. We will continue to mobilize her. It looks like she'll need rehabilitation.  LOS: 30 days     Kristina Estes 04/26/2015, 9:07 AM

## 2015-04-27 LAB — DIFFERENTIAL
BASOS ABS: 0 10*3/uL (ref 0.0–0.1)
Basophils Relative: 0 %
Eosinophils Absolute: 0.3 10*3/uL (ref 0.0–0.7)
Eosinophils Relative: 3 %
LYMPHS ABS: 2.9 10*3/uL (ref 0.7–4.0)
LYMPHS PCT: 28 %
Monocytes Absolute: 0.7 10*3/uL (ref 0.1–1.0)
Monocytes Relative: 7 %
Neutro Abs: 6.6 10*3/uL (ref 1.7–7.7)
Neutrophils Relative %: 62 %

## 2015-04-27 LAB — CBC
HCT: 26.1 % — ABNORMAL LOW (ref 36.0–46.0)
Hemoglobin: 8.5 g/dL — ABNORMAL LOW (ref 12.0–15.0)
MCH: 29.5 pg (ref 26.0–34.0)
MCHC: 32.6 g/dL (ref 30.0–36.0)
MCV: 90.6 fL (ref 78.0–100.0)
Platelets: 258 10*3/uL (ref 150–400)
RBC: 2.88 MIL/uL — AB (ref 3.87–5.11)
RDW: 15.9 % — ABNORMAL HIGH (ref 11.5–15.5)
WBC: 10.6 10*3/uL — AB (ref 4.0–10.5)

## 2015-04-27 NOTE — Evaluation (Signed)
Occupational Therapy Evaluation Patient Details Name: Kristina Estes MRN: [id number] DOB: 01-29-1971 Today's Date: 04/27/2015    History of Present Illness Pt admitted for surgery on 03/27/15, by Dr. Adam Phenix, for L5-S1 laminectomy; pt dropped her blood pressure and the surgery was discontinued and she was closed. She underwent Exploratory laparotomy for control of bleeding and left common iliac to left common femoral bypass 03/27/15 and placement of abdominal negative pressure dressing. On 03/29/15 abdominal washout, and new negative pressure dressing.  Underwent multiple return to OR for wound washout, evacuation of hematoma, and vac changes.  Developed DVT and underwent IVC filter placement.  Finally had wound closure on 04/15/15 and was extubated.  Pt is now s/p posterior instrumentation L5-S1. PMHx: asthma, arrhythmia, obesit   Clinical Impression   Patient presenting with decreased ADL and functional mobility independence secondary to above. Patient independent PTA. Patient currently functioning at an overall min to max assist level. Patient will benefit from acute OT to increase overall independence in the areas of ADLs, functional mobility, education on AE, and overall safety in order to safely discharge to venue listed below.     Follow Up Recommendations  SNF;Supervision/Assistance - 24 hour    Equipment Recommendations  Other (comment) (TBD next venue)    Recommendations for Other Services  None at this time   Precautions / Restrictions Precautions Precautions: Fall;Back Precaution Booklet Issued: Yes (comment) Precaution Comments: abdominal binder Required Braces or Orthoses: Spinal Brace Spinal Brace: Lumbar corset;Applied in sitting position Restrictions Weight Bearing Restrictions: No    Mobility Bed Mobility Overal bed mobility: Needs Assistance Bed Mobility: Rolling;Sidelying to Sit;Sit to Sidelying Rolling: Supervision Sidelying to sit: Min guard     Sit to sidelying:  Mod assist General bed mobility comments: Mod assist for management of BLEs for sit to sidelying. Cues for safety and for pt to adhere to back precautions.   Transfers Overall transfer level: Needs assistance Equipment used: Rolling walker (2 wheeled) Transfers: Sit to/from Stand Sit to Stand: Min guard General transfer comment: Min guard for safety. Cues for hand placement, technique, and safety during transfers    Balance Overall balance assessment: Needs assistance Sitting-balance support: Bilateral upper extremity supported;Feet supported Sitting balance-Leahy Scale: Good     Standing balance support: Bilateral upper extremity supported;During functional activity Standing balance-Leahy Scale: Fair    ADL Overall ADL's : Needs assistance/impaired Eating/Feeding: Set up;Sitting   Grooming: Set up;Sitting   Upper Body Bathing: Minimal assitance;Sitting   Lower Body Bathing: Moderate assistance;Sit to/from stand   Upper Body Dressing : Minimal assistance;Sitting Upper Body Dressing Details (indicate cue type and reason): assistance needed for lumbar corset Lower Body Dressing: Maximal assistance;Sit to/from stand Lower Body Dressing Details (indicate cue type and reason): pt needs to be educated on AE for LB ADLs Toilet Transfer: Min guard;RW;BSC;Ambulation   Toileting- Architect and Hygiene: Moderate assistance;Sit to/from Nurse, children's Details (indicate cue type and reason): did not occur Functional mobility during ADLs: Min guard;Supervision/safety;Cueing for safety;Rolling walker      Vision Additional Comments: no change from baseline          Pertinent Vitals/Pain Pain Assessment: Faces Pain Score: 3  Faces Pain Scale: Hurts little more Pain Location: back during mobility  Pain Descriptors / Indicators: Grimacing;Guarding Pain Intervention(s): Limited activity within patient's tolerance;Monitored during session;Repositioned      Hand Dominance Right   Extremity/Trunk Assessment Upper Extremity Assessment Upper Extremity Assessment: Generalized weakness   Lower Extremity Assessment Lower Extremity  Assessment: Defer to PT evaluation   Cervical / Trunk Assessment Cervical / Trunk Assessment: Normal   Communication Communication Communication: No difficulties   Cognition Arousal/Alertness: Awake/alert Behavior During Therapy: WFL for tasks assessed/performed Overall Cognitive Status: Within Functional Limits for tasks assessed              Home Living Family/patient expects to be discharged to:: Skilled nursing facility Living Arrangements: Parent;Spouse/significant other (lives here with spouse, but plans to go to mother's in [place]) Available Help at Discharge: Family Type of Home: House Home Access: Level entry (mother's home)     Home Layout: One level Home Equipment: None    Prior Functioning/Environment Level of Independence: Independent     OT Diagnosis: Generalized weakness   OT Problem List: Decreased strength;Decreased activity tolerance;Impaired balance (sitting and/or standing);Decreased safety awareness;Decreased knowledge of use of DME or AE;Decreased knowledge of precautions;Pain   OT Treatment/Interventions: Self-care/ADL training;Energy conservation;DME and/or AE instruction;Therapeutic activities;Patient/family education;Balance training    OT Goals(Current goals can be found in the care plan section) Acute Rehab OT Goals Patient Stated Goal: gain back independence, go to rehab soon OT Goal Formulation: With patient/family Time For Goal Achievement: 05/04/15 Potential to Achieve Goals: Good ADL Goals Pt Will Perform Grooming: with modified independence;standing Pt Will Perform Lower Body Bathing: with supervision;sit to/from stand;with adaptive equipment Pt Will Perform Lower Body Dressing: with supervision;with adaptive equipment;sit to/from stand Pt Will  Transfer to Toilet: with modified independence;ambulating;bedside commode Additional ADL Goal #1: Pt will be mod I with functional ambulation/mobility using LRAD during ADL  OT Frequency: Min 2X/week   Barriers to D/C: Decreased caregiver support   End of Session Equipment Utilized During Treatment: Rolling walker;Back brace  Activity Tolerance: Patient tolerated treatment well Patient left: in bed;with call bell/phone within reach;with family/visitor present;with nursing/sitter in room   Time: [id number] OT Time Calculation (min): 27 min Charges:  OT General Charges $OT Visit: 1 Procedure OT Evaluation $OT Eval Moderate Complexity: 1 Procedure OT Treatments $Self Care/Home Management : 8-22 mins  Edwin CapPatricia Taiquan Campanaro , MS, OTR/L, CLT Pager: [phone]  04/27/2015, 12:48 PM

## 2015-04-27 NOTE — Progress Notes (Signed)
Patient ID: Kristina Estes, female   DOB: 04-Dec-1970, 45 y.o.   MRN: [id number]     [facility]      [address]   [place], [place] [address]    Phone: [phone] FAX: [phone]     Subjective: BM overnight with suppository, feels better. Tolerating POs.   Objective:  Vital signs:  Filed Vitals:   04/26/15 0931 04/26/15 1305 04/26/15 2323 04/27/15 0145  BP: 128/76 137/96 134/88 135/70  Pulse: 121 108 100 94  Temp: 98.3 F (36.8 C) 98.5 F (36.9 C) 98.8 F (37.1 C) 99.3 F (37.4 C)  TempSrc: Oral Oral Oral Oral  Resp: Height:      Weight:      SpO2: 99% 100% 100% 100%    Last BM Date: 04/20/15  Intake/Output   Yesterday:  03/19 0701 - 03/20 0700 In: 10 [I.V.:10] Out: -  This shift:    Physical Exam: General: Pt awake/alert/oriented x4 in no acute distress  Abdomen: Soft.  Nondistended. Non tender.  Incision c/d/i. No evidence of peritonitis.  No incarcerated hernias.    Problem List:   Active Problems:   Lumbar spondylolysis   Acute respiratory failure with hypoxia (HCC)   Hemorrhagic shock   Hypervolemia   Acute respiratory failure with hypercapnia (HCC)   Fever, unspecified   Pressure ulcer   Hematoma of abdominal wall   Acute respiratory failure (HCC)   Atelectasis   Encounter for imaging study to confirm nasogastric (NG) tube placement   Abdominal swelling   Spinal stenosis    Results:   Labs: Results for orders placed or performed during the hospital encounter of 03/27/15 (from the past 48 hour(s))  Glucose, capillary     Status: Abnormal   Collection Time: 04/26/15  9:17 PM  Result Value Ref Range   Glucose-Capillary 101 (H) 65 - 99 mg/dL  CBC     Status: Abnormal   Collection Time: 04/27/15  7:08 AM  Result Value Ref Range   WBC 10.6 (H) 4.0 - 10.5 K/uL   RBC 2.88 (L) 3.87 - 5.11 MIL/uL   Hemoglobin 8.5 (L) 12.0 - 15.0 g/dL   HCT 24.4 (L) 01.0 - 27.2 %   MCV 90.6 78.0  - 100.0 fL   MCH 29.5 26.0 - 34.0 pg   MCHC 32.6 30.0 - 36.0 g/dL   RDW 53.6 (H) 64.4 - 03.4 %   Platelets 258 150 - 400 K/uL  Differential     Status: None   Collection Time: 04/27/15  7:08 AM  Result Value Ref Range   Neutrophils Relative % 62 %   Neutro Abs 6.6 1.7 - 7.7 K/uL   Lymphocytes Relative 28 %   Lymphs Abs 2.9 0.7 - 4.0 K/uL   Monocytes Relative 7 %   Monocytes Absolute 0.7 0.1 - 1.0 K/uL   Eosinophils Relative 3 %   Eosinophils Absolute 0.3 0.0 - 0.7 K/uL   Basophils Relative 0 %   Basophils Absolute 0.0 0.0 - 0.1 K/uL    Imaging / Studies: No results found.  Medications / Allergies:  Scheduled Meds: . docusate sodium  100 mg Oral BID  . feeding supplement  1 Container Oral BID BM  . metoCLOPramide  10 mg Oral TID AC & HS  . pantoprazole  40 mg Oral Daily  . polyethylene glycol  17 g Oral Daily  . sodium chloride flush  10-40 mL Intracatheter Q12H  . zolpidem  5 mg Oral QHS   Continuous Infusions: . sodium chloride 10 mL/hr at 04/13/15 0600   PRN Meds:.fentaNYL (SUBLIMAZE) injection, Glycerin (Adult), ibuprofen, methocarbamol, ondansetron (ZOFRAN) IV, oxyCODONE-acetaminophen, sodium chloride flush  Antibiotics: Anti-infectives    Start     Dose/Rate Route Frequency Ordered Stop   04/22/15 1835  bacitracin 50,000 Units in sodium chloride irrigation 0.9 % 500 mL irrigation  Status:  Discontinued       As needed 04/22/15 1835 04/22/15 2012   04/15/15 0930  polymyxin B 500,000 Units, bacitracin 50,000 Units in sodium chloride irrigation 0.9 % 500 mL irrigation  Status:  Discontinued       As needed 04/15/15 0930 04/15/15 0959   04/13/15 2230  vancomycin (VANCOCIN) IVPB 1000 mg/200 mL premix  Status:  Discontinued     1,000 mg 200 mL/hr over 60 Minutes Intravenous Every 12 hours 04/13/15 0948 04/16/15 1007   04/13/15 1200  imipenem-cilastatin (PRIMAXIN) 500 mg in sodium chloride 0.9 % 100 mL IVPB  Status:  Discontinued     500 mg 200 mL/hr over 30 Minutes  Intravenous 4 times per day 04/13/15 0946 04/16/15 1007   04/13/15 1000  ceFAZolin (ANCEF) IVPB 2 g/50 mL premix  Status:  Discontinued     2 g 100 mL/hr over 30 Minutes Intravenous 3 times per day 04/13/15 0901 04/13/15 0936   04/13/15 1000  vancomycin (VANCOCIN) 2,500 mg in sodium chloride 0.9 % 500 mL IVPB     2,500 mg 250 mL/hr over 120 Minutes Intravenous  Once 04/13/15 0948 04/13/15 1405   04/02/15 2330  vancomycin (VANCOCIN) IVPB 1000 mg/200 mL premix  Status:  Discontinued     1,000 mg 200 mL/hr over 60 Minutes Intravenous Every 12 hours 04/02/15 1015 04/05/15 0849   04/02/15 1830  piperacillin-tazobactam (ZOSYN) IVPB 3.375 g  Status:  Discontinued     3.375 g 12.5 mL/hr over 240 Minutes Intravenous 3 times per day 04/02/15 1015 04/09/15 0804   04/02/15 1100  cefUROXime (ZINACEF) 1.5 g in dextrose 5 % 50 mL IVPB  Status:  Discontinued     1.5 g 100 mL/hr over 30 Minutes Intravenous To [facility] 04/01/15 1106 04/02/15 1448   04/02/15 1100  vancomycin (VANCOCIN) 2,500 mg in sodium chloride 0.9 % 500 mL IVPB     2,500 mg 250 mL/hr over 120 Minutes Intravenous  Once 04/02/15 1029 04/02/15 1342   04/02/15 1030  vancomycin (VANCOCIN) 2,250 mg in sodium chloride 0.9 % 500 mL IVPB  Status:  Discontinued     2,250 mg 250 mL/hr over 120 Minutes Intravenous  Once 04/02/15 1015 04/02/15 1029   04/02/15 1030  piperacillin-tazobactam (ZOSYN) IVPB 3.375 g     3.375 g 100 mL/hr over 30 Minutes Intravenous  Once 04/02/15 1015 04/02/15 1212   03/27/15 2130  cefUROXime (ZINACEF) 1.5 g in dextrose 5 % 50 mL IVPB  Status:  Discontinued     1.5 g 100 mL/hr over 30 Minutes Intravenous Every 12 hours 03/27/15 2124 03/27/15 2132   03/27/15 1600  ceFAZolin (ANCEF) IVPB 2 g/50 mL premix     2 g 100 mL/hr over 30 Minutes Intravenous Every 8 hours 03/27/15 1322 03/28/15 0759   03/27/15 1447  ceFAZolin (ANCEF) 1-5 GM-% IVPB    Comments:  Merril Abbe   : cabinet override      03/27/15 1447  03/28/15 0259   03/27/15 0800  bacitracin 50,000 Units in sodium chloride irrigation 0.9 % 500 mL irrigation  Status:  Discontinued  As needed 03/27/15 0943 03/27/15 1223   03/27/15 0700  ceFAZolin (ANCEF) 3 g in dextrose 5 % 50 mL IVPB     3 g 130 mL/hr over 30 Minutes Intravenous To [facility] 03/26/15 1409 03/27/15 0750        Assessment/Plan S/p L5 laminectomy (2/17/7 - Dr. Conchita ParisNundkumar)  Left iliac vein and left common iliac artery bleeding  S/p Ex lap, repair of left iliac vein, ligation of distal left common iliac artery, left common iliac artery to left common femoral bypass with 8mm Dacron, placement of abdominal negative pressure dressing (03/27/15 - Early and Imogene Burnhen)  Subsequently 4 abdominal washouts and wound vac changes 2/19, 2/21, 2/23, 2/27 Imogene Burn(Chen) s/p exploratory laparotomy, abdominal wound closure with Phasix mesh, drain placement(Dr. Derrell Lollingamirez)  Post-operative abdominal wall hematoma s/p Exploration of abdominal wound, evacuation of hematoma, placement of negative pressure dressing (Ingram)(04/13/2015)  POD#912 wound exploration, evacuation of residual hematoma, closure of sking and subq tissue(Dr. Derrell LollingIngram) -tolerating diet, pain controlled, colace/miralax -abd binder with activity  -staples and sutures to be removed on 3/22 and steri strips applied   Dispo-CIR when available   Ashok NorrisEmina Hiroyuki Ozanich, [facility] CenterNP-BC [facility] Pager [phone](7A-4:30P) For consults and floor pages call [phone](7A-4:30P)  04/27/2015 10:05 AM

## 2015-04-27 NOTE — Progress Notes (Signed)
No issues overnight. Pt ambulating with PT >100' with rolling walker. Reports no significant back or leg pain while walking. No N/V, tolerating diet.  EXAM:  BP 137/73 mmHg  Pulse 106  Temp(Src) 97.6 F (36.4 C) (Oral)  Resp 20  Ht 5\' 5"  (1.651 m)  Wt 130.636 kg (288 lb)  BMI 47.93 kg/m2  SpO2 100%  LMP 03/27/2015  Awake, alert, oriented  Speech fluent, appropriate  CN grossly intact  5/5 BUE/BLE   IMPRESSION:  45 y.o. female s/p L5-S1 fusion, iliac repair, abdominal closure. Doing well  PLAN: - Transfer to SNF tomorrow for continued care and therapy services.

## 2015-04-27 NOTE — Progress Notes (Signed)
Physical Therapy Treatment Patient Details Name: Kristina Estes MRN: [id number] DOB: 1971-01-05 Today's Date: 04/27/2015    History of Present Illness Pt admitted for surgery on 03/27/15, by Dr. Shaune Spittle, for L5-S1 laminectomy; pt dropped her blood pressure and the surgery was discontinued and she was closed. She underwent Exploratory laparotomy for control of bleeding and left common iliac to left common femoral bypass 03/27/15 and placement of abdominal negative pressure dressing. On 03/29/15 abdominal washout, and new negative pressure dressing.  Underwent multiple return to OR for wound washout, evacuation of hematoma, and vac changes.  Developed DVT and underwent IVC filter placement.  Finally had wound closure on 04/15/15 and was extubated.  Pt is now s/p posterior instrumentation L5-S1. PMHx: asthma, arrhythmia, obesit    PT Comments    Pt moving well and able to advance mobility with all transfers, gait and functional activities today. Pt able to state 2/3 precautions with cues to recall arching. Pt with assist to don brace and continues to need cues to maintain precautions with activity. Will continue to follow.   Follow Up Recommendations  SNF     Equipment Recommendations  Rolling walker with 5" wheels (bari)    Recommendations for Other Services       Precautions / Restrictions Precautions Precautions: Fall;Back Precaution Booklet Issued: Yes (comment) Required Braces or Orthoses: Spinal Brace Spinal Brace: Lumbar corset;Applied in sitting position    Mobility  Bed Mobility Overal bed mobility: Needs Assistance Bed Mobility: Rolling;Sidelying to Sit Rolling: Supervision Sidelying to sit: Min guard       General bed mobility comments: cues for sequence to fully rotate into rolling and push up from side. Pt with assist to don brace due to pt size  Transfers Overall transfer level: Needs assistance   Transfers: Sit to/from Stand Sit to Stand: Min assist          General transfer comment: cues for hand placement, RW stabilized as pt pulling up on it with one hand, cues for posture  Ambulation/Gait Ambulation/Gait assistance: Min guard Ambulation Distance (Feet): 180 Feet Assistive device: Rolling walker (2 wheeled) Gait Pattern/deviations: Step-through pattern;Decreased stride length;Trunk flexed   Gait velocity interpretation: Below normal speed for age/gender General Gait Details: cue for posture and position in RW with gait, slow gait   Stairs            Wheelchair Mobility    Modified Rankin (Stroke Patients Only)       Balance                                    Cognition Arousal/Alertness: Awake/alert Behavior During Therapy: WFL for tasks assessed/performed Overall Cognitive Status: Within Functional Limits for tasks assessed                      Exercises General Exercises - Lower Extremity Long Arc Quad: AROM;Seated;Both;15 reps Hip Flexion/Marching: AROM;Seated;Both;15 reps    General Comments        Pertinent Vitals/Pain Pain Score: 3  Pain Location: back Pain Descriptors / Indicators: Aching Pain Intervention(s): Limited activity within patient's tolerance;Monitored during session;Repositioned    Home Living                      Prior Function            PT Goals (current goals can now be found in the care plan section)  Progress towards PT goals: Goals met and updated - see care plan    Frequency       PT Plan Discharge plan needs to be updated    Co-evaluation             End of Session Equipment Utilized During Treatment: Back brace Activity Tolerance: Patient tolerated treatment well Patient left: in chair;with call bell/phone within reach;with family/visitor present     Time: 6629-4765 PT Time Calculation (min) (ACUTE ONLY): 20 min  Charges:  $Gait Training: 8-22 mins                    G Codes:      Melford Aase 05-14-15, 9:29  AM Elwyn Reach, [place]

## 2015-04-28 MED ORDER — METOCLOPRAMIDE HCL 10 MG PO TABS: 10.0000 mg | ORAL_TABLET | Freq: Three times a day (TID) | ORAL | Status: DC

## 2015-04-28 MED ORDER — DOCUSATE SODIUM 100 MG PO CAPS: 100.0000 mg | ORAL_CAPSULE | Freq: Two times a day (BID) | ORAL | Status: AC

## 2015-04-28 MED ORDER — BOOST / RESOURCE BREEZE PO LIQD: 1.0000 | Freq: Two times a day (BID) | ORAL | Status: DC

## 2015-04-28 MED ORDER — OXYCODONE-ACETAMINOPHEN 5-325 MG PO TABS: 1.0000 | ORAL_TABLET | Freq: Four times a day (QID) | ORAL | Status: DC | PRN

## 2015-04-28 MED ORDER — PANTOPRAZOLE SODIUM 40 MG PO TBEC: 40.0000 mg | DELAYED_RELEASE_TABLET | Freq: Every day | ORAL | Status: DC

## 2015-04-28 MED ORDER — POLYETHYLENE GLYCOL 3350 17 G PO PACK: 17.0000 g | PACK | Freq: Every day | ORAL | Status: AC

## 2015-04-28 MED ORDER — METHOCARBAMOL 750 MG PO TABS: 750.0000 mg | ORAL_TABLET | Freq: Four times a day (QID) | ORAL | Status: DC | PRN

## 2015-04-28 NOTE — Progress Notes (Signed)
Pt for discharge to SNF today. Discharge orders received. IV dcd. Discharge instructions and prescriptions given with verbalized understanding. Family at bedside to assist with discharge. Staff brought patient to lobby via wheelchair at 1052. Husband will transport per private [facility] [place]. Report given to receiving nurse at 1220.

## 2015-04-28 NOTE — Discharge Summary (Signed)
Physician Discharge Summary  Patient ID: Kristina Estes MRN: [id number] DOB/AGE: 46/03/1970 46 y.o.  Admit date: 03/27/2015 Discharge date: 04/28/2015  Admission Diagnoses: Lumbar spondylolisthesis, L5-S1  Discharge Diagnoses: Same Active Problems:   Lumbar spondylolysis   Acute respiratory failure with hypoxia (HCC)   Hemorrhagic shock   Hypervolemia   Acute respiratory failure with hypercapnia (HCC)   Fever, unspecified   Pressure ulcer   Hematoma of abdominal wall   Acute respiratory failure (HCC)   Atelectasis   Encounter for imaging study to confirm nasogastric (NG) tube placement   Abdominal swelling   Spinal stenosis   Discharged Condition: Stable  Hospital Course:  Kristina Estes is a 45 y.o. female who is brought in electively for L5-S1 fusion. During surgery, she experienced acute intraoperative hypotension. The procedure was aborted, and she was found to have a large retroperitoneal hematoma. She was emergently taken to the operating room by vascular surgery for exploratory laparotomy, and repair of iliac artery and vein injuries, which required left common iliac to left common femoral bypass. She did experience significant bowel edema which prevented primary closure at the time of her vascular repair. She was taken to the vascular intensive care unit, where she was monitored closely. She remained relatively stable hemodynamically, and required multiple trips to the operating room for wound VAC change, and attempted closure. She was ultimately taken back to the operating room by general surgery for mesh closure of the abdomen. During this time, she was heparinized, and after her mesh closure, she developed a large subcutaneous hematoma, requiring blood transfusion. She was therefore taken back to the operating room for evacuation of hematoma. The mesh was found to be intact, without any visceral herniation. The skin and subcutaneous layer was left open, and she was again  taken back to the operating room for primary skin closure several days later. Subsequent to this. She was extubated without difficulty. At this point, she was transferred to the stepdown unit, where she continued to be monitored. She was hemodynamically stable, and she was then taken to the operating room for completion of her lumbar surgery, with placement of L5-S1 interbody graft, and pedicle screw fixation. She tolerated this procedure very well, and was returned to the stepdown unit. She remained neurologically and hemodynamically stable. She was then transferred to the floor. Physical and occupational therapy began working with the patient after her lumbar surgery, and she made slow steady improvement. She was ambulating with the assistance of a rolling walker. She required minimal to moderate assist for activities of daily living. She was found to be a good candidate for skilled nursing facility placement. At the time of discharge she was tolerating diet without nausea or vomiting, was passing flatus and having bowel movements, was neurologically intact, and voiding normally. Pain was under control with oral medication.  Wound Care: 1. Abdominal wound staples to be d/c'ed on 3/22 with placement of steri-strips 2. Dry gauze to left groin to wick moisture 3. Leave lumbar wound open  Treatments: 1. L5-S1 decompression 2. Exploratory laparotomy, repair of iliac vein, common iliac to common femoral bypass, abdominal wound VAC placement 3. Abdominal wound washout and VAC change (4)  4. Exploratory laparotomy, abdominal wound closure with Phasix mesh  5.  evacuation of abdominal wall hematoma 6. Closure of skin and subcutaneous tissue for abdominal wound 7. Placement of L5-S1 interbody graft and pedicle screw fixation  Discharge Exam: Blood pressure 139/74, pulse 99, temperature 98 F (36.7 C), temperature source Oral, resp.  rate 20, height  (1.651 m), weight 130.636 kg (288 lb), last  menstrual period 03/27/2015, SpO2 100 %. Awake, alert, oriented Speech fluent, appropriate CN grossly intact 5/5 BUE/BLE Abd non-tender, non-distended Wounds c/d/i  Disposition: Final discharge disposition not confirmed     Medication List    STOP taking these medications        aspirin-sod bicarb-citric acid 325 MG Tbef tablet  Commonly known as:  ALKA-SELTZER     cyclobenzaprine 10 MG tablet  Commonly known as:  FLEXERIL     meloxicam 15 MG tablet  Commonly known as:  MOBIC     phentermine 37.5 MG tablet  Commonly known as:  ADIPEX-P      TAKE these medications        docusate sodium 100 MG capsule  Commonly known as:  COLACE  Take 1 capsule (100 mg total) by mouth 2 (two) times daily.     feeding supplement Liqd  Take 1 Container by mouth 2 (two) times daily between meals.     gabapentin 300 MG capsule  Commonly known as:  NEURONTIN  Take 300 mg by mouth 3 (three) times daily.     methocarbamol 750 MG tablet  Commonly known as:  ROBAXIN  Take 1 tablet (750 mg total) by mouth every 6 (six) hours as needed for muscle spasms.     metoCLOPramide 10 MG tablet  Commonly known as:  REGLAN  Take 1 tablet (10 mg total) by mouth 4 (four) times daily -  before meals and at bedtime.     oxybutynin 5 MG 24 hr tablet  Commonly known as:  DITROPAN-XL  Take 10 mg by mouth at bedtime.     oxyCODONE-acetaminophen 5-325 MG tablet  Commonly known as:  PERCOCET/ROXICET  Take 1-2 tablets by mouth every 6 (six) hours as needed for moderate pain or severe pain.     pantoprazole 40 MG tablet  Commonly known as:  PROTONIX  Take 1 tablet (40 mg total) by mouth daily.     polyethylene glycol packet  Commonly known as:  MIRALAX / GLYCOLAX  Take 17 g by mouth daily.       Follow-up Information    Follow up with Lajean Saver, MD In 3 weeks.   Specialty:  General Surgery   Why:  post op abdominal surgery check up    Contact information:   [address] [place] [address] [phone]       Follow up with Leonides Sake, MD In 4 weeks.   Specialties:  Vascular Surgery, Cardiology   Why:  Our office will call you to arrange an appointment (sent)   Contact information:   [address] [place] [address] [phone]       Follow up with Lisbeth Renshaw, C, MD In 2 weeks.   Specialty:  Neurosurgery   Contact information:   [address] [place] [address] [phone]       Signed: Jackelyn Hoehn 04/28/2015, 9:47 AM

## 2015-04-28 NOTE — Care Management Note (Signed)
Case Management Note  Patient Details  Name: Kristina Estes MRN: [id number] Date of Birth: 08/19/1970  Subjective/Objective:                    Action/Plan: Plan is for patient to discharge tomorrow to [facility] in [place], [place]. CM has spoken with Misty StanleyLisa at [facility] multiple times today and faxed the information she requested. Misty StanleyLisa has spoken with patients insurance and has received the approval for IR at their facility. CM spoke with [facility] regarding transportation to the facility and informed the patient of the cost for the ambulance transport. The patient chooses to go by private vehicle and will make the arrangements. CM informed her that [facility] wants her to be there before 5pm on 04/28/2015 to do their admission assessment. Patient states she is able to have that arranged. CM will follow tomorrow to assist with further d/c needs.   Expected Discharge Date:                  Expected Discharge Plan:  IP Rehab Facility  In-House Referral:  Clinical Social Work  Discharge planning Services  CM Consult  Post Acute Care Choice:  NA Choice offered to:  NA  DME Arranged:  N/A DME Agency:  NA  HH Arranged:  NA HH Agency:  NA  Status of Service:  Completed, signed off  Medicare Important Message Given:    Date Medicare IM Given:    Medicare IM give by:    Date Additional Medicare IM Given:    Additional Medicare Important Message give by:     If discussed at Long Length of Stay Meetings, dates discussed:    Additional Comments:  Kermit BaloKelli F Sharisse Rantz, RN 04/28/2015, 11:34 AM

## 2015-04-28 NOTE — Discharge Instructions (Signed)

## 2015-04-28 NOTE — Care Management Note (Addendum)
Case Management Note  Patient Details  Name: Jackquline BoschLaverne Rybka MRN: [id number] Date of Birth: 04/17/1970  Subjective/Objective:                    Action/Plan: Patient discharging to [facility] today. CM has spoken to LydiaLisa with [facility] this am and faxed over her original H&P and her discharge summary as per requested. Paper prescriptions given to the patient and bedside RN to print a copy of the AVS for the facility and call report. Patient is going by private vehicle and will inform CM of departure so CM can call and let [facility] know when to expect the patient. Bedside RN updated.  Addendum: (1100 am) Patient has left with her family for [facility]. Misty StanleyLisa with [facility] informed that patient is on her way.   Expected Discharge Date:                  Expected Discharge Plan:  IP Rehab Facility  In-House Referral:  Clinical Social Work  Discharge planning Services  CM Consult  Post Acute Care Choice:  NA Choice offered to:  NA  DME Arranged:  N/A DME Agency:  NA  HH Arranged:  NA HH Agency:  NA  Status of Service:  Completed, signed off  Medicare Important Message Given:    Date Medicare IM Given:    Medicare IM give by:    Date Additional Medicare IM Given:    Additional Medicare Important Message give by:     If discussed at Long Length of Stay Meetings, dates discussed:    Additional Comments:  Kermit BaloKelli F Fabrizzio Marcella, RN 04/28/2015, 10:15 AM

## 2015-04-28 NOTE — Progress Notes (Addendum)
[facility] Progress Note  Subjective    Doing well. Ready for d/c to rehab.   Objective Filed Vitals:   04/28/15 0053 04/28/15 0519  BP: 139/81 139/74  Pulse: 124 99  Temp: 98.3 F (36.8 C) 98 F (36.7 C)  Resp: 20 20   No intake or output data in the 24 hours ending 04/28/15 0813  Left groin incision intact. Area of dehiscence now almost healed with red granulation tissue.  Palpable left DP pulse.  Midline abdominal incisions clean and intact.   Assessment/Planning: 45 y.o. female is s/p: ex-lap and repair of left iliac vein, ligation of distal left common iliac artery, left common iliac artery to left common femoral bypass, placement of negative pressure dressing (03/27/15)  Left groin incision almost healed. Meticulous skin hygiene with dry gauze to left groin to wick moisture.  Plans for d/c noted. F/u in 4 weeks with Dr. Imogene Burn.   Raymond Gurney 04/28/2015 8:13 AM --  Laboratory CBC    Component Value Date/Time   WBC 10.6* 04/27/2015 0708   HGB 8.5* 04/27/2015 0708   HCT 26.1* 04/27/2015 0708   PLT 258 04/27/2015 0708    BMET    Component Value Date/Time   NA 135 04/23/2015 0500   K 4.7 04/23/2015 0500   CL 105 04/23/2015 0500   CO2 24 04/23/2015 0500   GLUCOSE 200* 04/23/2015 0500   BUN 17 04/23/2015 0500   CREATININE 0.77 04/23/2015 0500   CALCIUM 8.2* 04/23/2015 0500   GFRNONAA >60 04/23/2015 0500   GFRAA >60 04/23/2015 0500    COAG Lab Results  Component Value Date   INR 1.30 04/15/2015   INR 1.34 04/13/2015   INR 1.20 04/02/2015   No results found for: PTT  Antibiotics Anti-infectives    Start     Dose/Rate Route Frequency Ordered Stop   04/22/15 1835  bacitracin 50,000 Units in sodium chloride irrigation 0.9 % 500 mL irrigation  Status:  Discontinued       As needed 04/22/15 1835 04/22/15 2012   04/15/15 0930  polymyxin B 500,000 Units, bacitracin 50,000 Units in sodium chloride irrigation 0.9 % 500 mL irrigation   Status:  Discontinued       As needed 04/15/15 0930 04/15/15 0959   04/13/15 2230  vancomycin (VANCOCIN) IVPB 1000 mg/200 mL premix  Status:  Discontinued     1,000 mg 200 mL/hr over 60 Minutes Intravenous Every 12 hours 04/13/15 0948 04/16/15 1007   04/13/15 1200  imipenem-cilastatin (PRIMAXIN) 500 mg in sodium chloride 0.9 % 100 mL IVPB  Status:  Discontinued     500 mg 200 mL/hr over 30 Minutes Intravenous 4 times per day 04/13/15 0946 04/16/15 1007   04/13/15 1000  ceFAZolin (ANCEF) IVPB 2 g/50 mL premix  Status:  Discontinued     2 g 100 mL/hr over 30 Minutes Intravenous 3 times per day 04/13/15 0901 04/13/15 0936   04/13/15 1000  vancomycin (VANCOCIN) 2,500 mg in sodium chloride 0.9 % 500 mL IVPB     2,500 mg 250 mL/hr over 120 Minutes Intravenous  Once 04/13/15 0948 04/13/15 1405   04/02/15 2330  vancomycin (VANCOCIN) IVPB 1000 mg/200 mL premix  Status:  Discontinued     1,000 mg 200 mL/hr over 60 Minutes Intravenous Every 12 hours 04/02/15 1015 04/05/15 0849   04/02/15 1830  piperacillin-tazobactam (ZOSYN) IVPB 3.375 g  Status:  Discontinued     3.375 g 12.5 mL/hr over 240 Minutes Intravenous 3 times  per day 04/02/15 1015 04/09/15 0804   04/02/15 1100  cefUROXime (ZINACEF) 1.5 g in dextrose 5 % 50 mL IVPB  Status:  Discontinued     1.5 g 100 mL/hr over 30 Minutes Intravenous To ShortStay Surgical 04/01/15 1106 04/02/15 1448   04/02/15 1100  vancomycin (VANCOCIN) 2,500 mg in sodium chloride 0.9 % 500 mL IVPB     2,500 mg 250 mL/hr over 120 Minutes Intravenous  Once 04/02/15 1029 04/02/15 1342   04/02/15 1030  vancomycin (VANCOCIN) 2,250 mg in sodium chloride 0.9 % 500 mL IVPB  Status:  Discontinued     2,250 mg 250 mL/hr over 120 Minutes Intravenous  Once 04/02/15 1015 04/02/15 1029   04/02/15 1030  piperacillin-tazobactam (ZOSYN) IVPB 3.375 g     3.375 g 100 mL/hr over 30 Minutes Intravenous  Once 04/02/15 1015 04/02/15 1212   03/27/15 2130  cefUROXime (ZINACEF) 1.5 g in  dextrose 5 % 50 mL IVPB  Status:  Discontinued     1.5 g 100 mL/hr over 30 Minutes Intravenous Every 12 hours 03/27/15 2124 03/27/15 2132   03/27/15 1600  ceFAZolin (ANCEF) IVPB 2 g/50 mL premix     2 g 100 mL/hr over 30 Minutes Intravenous Every 8 hours 03/27/15 1322 03/28/15 0759   03/27/15 1447  ceFAZolin (ANCEF) 1-5 GM-% IVPB    Comments:  Merril AbbeButler, David   : cabinet override      03/27/15 1447 03/28/15 0259   03/27/15 0800  bacitracin 50,000 Units in sodium chloride irrigation 0.9 % 500 mL irrigation  Status:  Discontinued       As needed 03/27/15 0943 03/27/15 1223   03/27/15 0700  ceFAZolin (ANCEF) 3 g in dextrose 5 % 50 mL IVPB     3 g 130 mL/hr over 30 Minutes Intravenous To ShortStay Surgical 03/26/15 1409 03/27/15 0750       Maris BergerKimberly Trinh, PA-C [facility] Office: [phone] Pager: [phone] 04/28/2015 8:13 AM     Addendum  I agree with the physician assistant's findings.  Pt will need longitudinal follow up on her L CIA to CFA bypass and her IVC filter, which needs to be removed within 3 months ideally.  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   Leonides SakeBrian Chen, MD [facility] [place] Office: [phone] Pager: [phone]  04/28/2015, 8:17 AM

## 2015-04-29 ENCOUNTER — Telehealth: Payer: Self-pay | Admitting: Vascular Surgery

## 2015-04-29 NOTE — Telephone Encounter (Signed)
Spoke with pt, dpm °

## 2015-04-29 NOTE — Telephone Encounter (Signed)
-----   Message from Sharee PimpleMarilyn K McChesney, RN sent at 04/28/2015  9:46 AM EDT ----- Regarding: schedule   ----- Message -----    From: Raymond GurneyKimberly A Trinh, PA-C    Sent: 04/28/2015   8:18 AM      To: [facility] Charge Pool  S/p ex-lap and repair of left iliac vein, ligation of distal left common iliac artery, left common iliac artery to left common femoral bypass, placement of negative pressure dressing (03/27/15)  F/u with Dr. Imogene Burnhen 4 weeks from now.   Thanks Selena BattenKim

## 2015-05-20 ENCOUNTER — Encounter: Payer: Self-pay | Admitting: Vascular Surgery

## 2015-05-28 DIAGNOSIS — S35516A Injury of unspecified iliac vein, initial encounter: Secondary | ICD-10-CM | POA: Insufficient documentation

## 2015-05-28 DIAGNOSIS — S35512A Injury of left iliac artery, initial encounter: Secondary | ICD-10-CM | POA: Insufficient documentation

## 2015-05-28 NOTE — Progress Notes (Deleted)
Postoperative Visit   History of Present Illness  Kristina Estes is a 45 y.o. year old female who presents for postoperative follow-up for:  03/29/15: 1. Exploratory laparotomy 2. Repair of left iliac vein (dictated by Dr. Arbie Cookey) 3. Ligation of distal left common iliac artery (dictated by Dr. Arbie Cookey) 4. Left common iliac artery to left common femoral artery bypass with 8 mm Dacron 5. Placement of abdominal negative pressure dressing  03/29/15, 03/31/15, 04/02/15, 04/06/15: Abd VAC change  04/14/15: IVCF placement  The patient's wounds are *** healed.  The patient notes *** resolution of lower extremity symptoms.  The patient is *** able to complete their activities of daily living.  The patient's current symptoms are: ***.  Current Outpatient Prescriptions  Medication Sig Dispense Refill  . docusate sodium (COLACE) 100 MG capsule Take 1 capsule (100 mg total) by mouth 2 (two) times daily. 10 capsule 0  . feeding supplement (BOOST / RESOURCE BREEZE) LIQD Take 1 Container by mouth 2 (two) times daily between meals.  0  . gabapentin (NEURONTIN) 300 MG capsule Take 300 mg by mouth 3 (three) times daily.    . methocarbamol (ROBAXIN) 750 MG tablet Take 1 tablet (750 mg total) by mouth every 6 (six) hours as needed for muscle spasms.    . metoCLOPramide (REGLAN) 10 MG tablet Take 1 tablet (10 mg total) by mouth 4 (four) times daily -  before meals and at bedtime.    Marland Kitchen oxybutynin (DITROPAN-XL) 5 MG 24 hr tablet Take 10 mg by mouth at bedtime.  3  . oxyCODONE-acetaminophen (PERCOCET/ROXICET) 5-325 MG tablet Take 1-2 tablets by mouth every 6 (six) hours as needed for moderate pain or severe pain. 60 tablet 0  . pantoprazole (PROTONIX) 40 MG tablet Take 1 tablet (40 mg total) by mouth daily.    . polyethylene glycol (MIRALAX / GLYCOLAX) packet Take 17 g by mouth daily. 14 each 0   No current facility-administered medications for this visit.    For VQI Use Only  PRE-ADM LIVING: {VQI  Pre-admission Living:20973}  AMB STATUS: {VQI Ambulatory Status:20974}  Physical Examination  There were no vitals filed for this visit.  ABD: midline incision is *** healed  L groin: Incisions are *** healed,   VASC: L pedal pulses are *** palpable  Medical Decision Making  Kristina Estes is a 45 y.o. year old female who presents s/p repair of L CIV, ligation of distal L CIA, L CIA to CFA bypass for L common iliac artery and vein injuries during spinal procedure .  The patient's bypass incisions are *** healing appropriately with resolution of pre-operative symptoms. I discussed in depth with the patient the nature of atherosclerosis, and emphasized the importance of maximal medical management including strict control of blood pressure, blood glucose, and lipid levels, obtaining regular exercise, and cessation of smoking.  The patient is aware that without maximal medical management the underlying atherosclerotic disease process will progress, limiting the benefit of any interventions. The patient's surveillance will included ABI and bypass duplex studies which will be completed in: 3 months, at which time the patient will be re-evaluated.   I emphasized the importance of routine surveillance of the patient's bypass, as the vascular surgery literature emphasize the improved patency possible with assisted primary patency procedures versus secondary patency procedures. The patient agrees to participate in their maximal medical care and routine surveillance.  Thank you for allowing [place] to participate in this patient's care.  Leonides Sake, MD [facility] [place] Office: [phone] Pager: [phone]  05/28/2015, 9:34 PM

## 2015-05-29 ENCOUNTER — Encounter: Payer: BLUE CROSS/BLUE SHIELD | Admitting: Vascular Surgery

## 2015-05-31 NOTE — Progress Notes (Signed)
This encounter was created in error - please disregard.

## 2015-06-03 ENCOUNTER — Encounter: Payer: BLUE CROSS/BLUE SHIELD | Admitting: Vascular Surgery

## 2015-06-12 ENCOUNTER — Encounter: Payer: Self-pay | Admitting: Family

## 2015-06-16 ENCOUNTER — Telehealth: Payer: Self-pay

## 2015-06-16 NOTE — Telephone Encounter (Signed)
Phone call from pt. reported she had one episode of vomiting yesterday, after eating steak on an empty stomach.  Related hx that she had vomiting while in the hospital.  Today, denies any nausea or vomiting.  Denies any fever.  Stated she has an intermittent pain in left side; described as "a sharp/dull pain."  Stated she has been very tired lately, and has to sit down to dry off, when she gets out of the shower.  Stated she has "no energy."  Reported she is dizzy, and that the dizziness is worse today.  Questioned about hydration.  Stated she is not drinking much fluids.  (Noted a Hgb. 8.5 on 3/20.)  Stated she doesn't have a PCP, but that no one has followed up on her blood count, since she was discharged.  Advised pt. To go to Urgent care to report dizziness, and fatigue, and recent hx of anemia, to attempt to get Hgb checked.  Pt. Verb. Understanding, and agreed to go to Urgent Care tomorrow.  Will make Dr. Imogene Burnhen aware.  Pt. Has f/u appt. With Dr. Imogene Burnhen on 5/12.

## 2015-06-18 NOTE — Progress Notes (Signed)
    Postoperative Visit   History of Present Illness  Kristina Estes is a 45 y.o. year old female who presents for postoperative follow-up for:   1. Xlap, repair L iliac vein, ligation of distal left CIA, L CIA to L CFA BPG, VAC (03/29/15) done for L CIV and CIA injury during spine case 2. Multiple abd washout and VAC (2/19, 2/21, 2/23, 2/24) 3. Abd closure with mesh by Dr. Derrell Lollingamirez (04/08/15) 4. Evac hematoma by Dr. Derrell LollingIngram (04/13/15) 5. IVCF placement by Dr. Myra GianottiBrabham (04/14/15) 6. Repeat evac hematoma by Dr. Derrell LollingIngram (04/22/15)  The patient had her spine instrumentation with Dr. Conchita ParisNundkumar on 04/22/15. The patient's wounds are healed.  The patient notes no lower extremity symptoms.  The patient is able to complete their activities of daily living.  The patient's current symptoms are: fatigue and pain in RLQ.  Reported her blood level is dropping again.   For VQI Use Only  PRE-ADM LIVING: Home  AMB STATUS: Ambulatory   Physical Examination  Filed Vitals:   06/19/15 1241  BP: 104/55  Pulse: 65  Temp: 97.8 F (36.6 C)   Abdomen: inc healed  LLE: groin incision is healed, pedal pulses are palapble  Medical Decision Making  Kristina Estes is a 45 y.o. year old female who presents s/p Repair of L CIV, ligation of distal L CIA, L CIA to CFA BPG, IVCF placement   Given her period of extended ventilation and intubation, I think the patient appears to be better condition than I expected.  Her fatigue and residual pain is likely related to decondition and continued healing from her multiple procedures.  I am not surprised she is not ready to return to work.  I would expect her to need another 1-2 months before being mostly recovered.  The patient's bypass incisions are healed. I discussed in depth with the patient the nature of atherosclerosis, and emphasized the importance of maximal medical management including strict control of blood pressure, blood glucose, and lipid levels, obtaining  regular exercise, and cessation of smoking.  The patient is aware that without maximal medical management the underlying atherosclerotic disease process will progress, limiting the benefit of any interventions. The patient's surveillance will included ABI and aortoiliac duplex studies which will be completed in: 3 months, at which time the patient will be re-evaluated.   I emphasized the importance of routine surveillance of the patient's bypass, as the vascular surgery literature emphasize the improved patency possible with assisted primary patency procedures versus secondary patency procedures. The patient agrees to participate in their maximal medical care and routine surveillance. In regards to her IVCF, would recheck her LLE for DVT.  If resolved, would consider removing her IVCF given its prothrombotic nature. This is scheduled for the next month.  Thank you for allowing us to participate in this patient's care.   Leonides SakeBrian Chen, MD [facility] [place] Office: [phone] Pager: [phone]  06/18/2015, 10:08 PM

## 2015-06-19 ENCOUNTER — Encounter: Payer: Self-pay | Admitting: Vascular Surgery

## 2015-06-19 ENCOUNTER — Ambulatory Visit (INDEPENDENT_AMBULATORY_CARE_PROVIDER_SITE_OTHER): Payer: BLUE CROSS/BLUE SHIELD | Admitting: Vascular Surgery

## 2015-06-19 VITALS — BP 104/55 | HR 65 | Temp 97.8°F | Ht 65.0 in | Wt 285.8 lb

## 2015-06-19 DIAGNOSIS — S35515D Injury of left iliac vein, subsequent encounter: Secondary | ICD-10-CM

## 2015-06-19 DIAGNOSIS — I82409 Acute embolism and thrombosis of unspecified deep veins of unspecified lower extremity: Secondary | ICD-10-CM | POA: Insufficient documentation

## 2015-06-19 DIAGNOSIS — S35512D Injury of left iliac artery, subsequent encounter: Secondary | ICD-10-CM

## 2015-06-19 DIAGNOSIS — I82412 Acute embolism and thrombosis of left femoral vein: Secondary | ICD-10-CM

## 2015-06-22 ENCOUNTER — Other Ambulatory Visit: Payer: Self-pay | Admitting: Family Medicine

## 2015-06-22 DIAGNOSIS — I82412 Acute embolism and thrombosis of left femoral vein: Secondary | ICD-10-CM

## 2015-07-10 ENCOUNTER — Encounter: Payer: Self-pay | Admitting: Vascular Surgery

## 2015-07-16 DIAGNOSIS — Z0279 Encounter for issue of other medical certificate: Secondary | ICD-10-CM | POA: Diagnosis not present

## 2015-07-16 NOTE — Progress Notes (Signed)
Postoperative Visit   History of Present Illness  Kristina Estes is a 45 y.o. (01-11-1971) female  who presents for postoperative follow-up for: Venous duplex evaluation of LLE.  The patient had a IVC filter placement due to bleeding complication with anticoagulation.  1. Xlap, repair L iliac vein, ligation of distal left CIA, L CIA to L CFA BPG, VAC (03/29/15) done for L CIV and CIA injury during spine case 2. Multiple abd washout and VAC (2/19, 2/21, 2/23, 2/24) 3. Abd closure with mesh by Dr. Derrell Lolling (04/08/15) 4. Evac hematoma by Dr. Derrell Lolling (04/13/15) 5. IVCF placement by Dr. Myra Gianotti (04/14/15) 6. Repeat evac hematoma by Dr. Derrell Lolling (04/22/15)  The patient had her spine instrumentation with Dr. Conchita Paris on 04/22/15. The patient's wounds are healed. The patient notes no lower extremity symptoms. The patient is able to complete their activities of daily living. The patient's current symptoms are: deep LLQ pain.    Past Medical History  Diagnosis Date  . Anginal pain (HCC) 2016    received referral to [facility]  . Dysrhythmia   . Asthma     as a child  . GERD (gastroesophageal reflux disease)   . Arthritis   . Obesity   . Difficult intravenous access 03/27/2015    Patient has no PIV targets, will require central line or PICC anytime general anesthesia is needed    Past Surgical History  Procedure Laterality Date  . Ectopic pregnancy surgery  1998    [place], [place]   . Carpal tunnel release Bilateral 2004    [facility]  . Cholecystectomy  2014    [facility] in [place]  . Diagnostic laparoscopy  2014  . Repair knee ligament Right 2014    [place], [place]  . Tonsillectomy    . Tonsillectomy and adenoidectomy  1998  . Fracture surgery      left foot   . Wisdom tooth extraction    . Colonoscopy    . Abdominal aortic aneurysm repair Left 03/27/2015    Procedure: Repair Left Common Iliac Vein, Ligation of Left Iliac Artery, Left Common  Ileo-Femoral Bypass Graft;  Surgeon: Fransisco Hertz, MD;  Location: [facility] OR;  Service: Vascular;  Laterality: Left;  . Repair iliac artery Left 03/27/2015    Procedure: REPAIR ILIAC ARTERY with graft from Left Common Iliac to Left Femoral Artery;  Surgeon: Fransisco Hertz, MD;  Location: [facility] OR;  Service: Vascular;  Laterality: Left;  . Abdominal exposure N/A 03/29/2015    Procedure: ABDOMINAL WASHOUT AND APPLICATION OF WOUND VAC;  Surgeon: Fransisco Hertz, MD;  Location: [facility] OR;  Service: Vascular;  Laterality: N/A;  . Abdominal wound dehiscence N/A 03/31/2015    Procedure: ABDOMINAL WASHOUT; POSSIBLE ABDOMINAL CLOSURE;  Surgeon: Fransisco Hertz, MD;  Location: [facility] OR;  Service: Vascular;  Laterality: N/A;  . Application of wound vac N/A 03/31/2015    Procedure: APPLICATION OF WOUND VAC;  Surgeon: Fransisco Hertz, MD;  Location: [facility] OR;  Service: Vascular;  Laterality: N/A;  . Abdominal wound dehiscence N/A 04/02/2015    Procedure: ABDOMINAL WASHOUT;  Surgeon: Fransisco Hertz, MD;  Location: [facility] OR;  Service: Vascular;  Laterality: N/A;  . Application of wound vac N/A 04/02/2015    Procedure: APPLICATION OF WOUND VAC;  Surgeon: Fransisco Hertz, MD;  Location: [facility] OR;  Service: Vascular;  Laterality: N/A;  . Abdominal wound dehiscence N/A 04/06/2015    Procedure: ABDOMINAL WASHOUT;  Surgeon: Arlys John  Rolena InfanteL Chen, MD;  [facility];  Service: Vascular;  Laterality: N/A;  . Application of wound vac N/A 04/06/2015    Procedure: APPLICATION OF WOUND VAC;  Surgeon: Fransisco HertzBrian L Chen, MD;  [facility];  Service: Vascular;  Laterality: N/A;  . Laparotomy N/A 04/08/2015    Procedure: EXPLORATORY LAPAROTOMY/ABD. WALL CLOSURE;  Surgeon: Axel FillerArmando Ramirez, MD;  [facility];  Service: General;  Laterality: N/A;  . Laparotomy N/A 04/13/2015    Procedure: EXPLORATORY LAPAROTOMY;  Surgeon: Claud KelpHaywood Ingram, MD;  [facility];  Service: General;  Laterality: N/A;  . Application of wound vac N/A 04/13/2015    Procedure: APPLICATION OF WOUND VAC;  Surgeon:  Claud KelpHaywood Ingram, MD;  [facility] OR;  Service: General;  Laterality: N/A;  . Vena cava filter placement N/A 04/14/2015    Procedure: INSERTION VENA-CAVA FILTER;  Surgeon: Nada LibmanVance W Brabham, MD;  [facility];  Service: Vascular;  Laterality: N/A;  . Dressing change under anesthesia N/A 04/15/2015    Procedure: Exploration of abdominal wound;  Surgeon: Claud KelpHaywood Ingram, MD;  [facility]: [facility] OR;  Service: General;  Laterality: N/A;  . Wound debridement Bilateral 04/15/2015    Procedure: CLOSURE OF ABDOMINAL WOUND;  Surgeon: Claud KelpHaywood Ingram, MD;  [facility]: [facility] OR;  Service: General;  Laterality: Bilateral;  . Transforaminal lumbar interbody fusion (tlif) with pedicle screw fixation 1 level N/A 04/22/2015    Procedure: LUMBAR FIVE-SACRAL ONE TRANSFORAMINAL LUMBAR INTERBODY FUSION WITH PEDICLE SCREW FIXATION;  Surgeon: Lisbeth RenshawNeelesh Nundkumar, MD;  [facility];  Service: Neurosurgery;  Laterality: N/A;    Social History   Social History  . Marital Status: Married    Spouse Name: N/A  . Number of Children: N/A  . Years of Education: N/A   Occupational History  . Not on file.   Social History Main Topics  . Smoking status: Never Smoker   . Smokeless tobacco: Never Used  . Alcohol Use: No  . Drug Use: No  . Sexual Activity: Not on file   Other Topics Concern  . Not on file   Social History Narrative    No family history on file.  Current Outpatient Prescriptions  Medication Sig Dispense Refill  . aspirin-sod bicarb-citric acid (ALKA-SELTZER) 325 MG TBEF tablet Take 325 mg by mouth every 6 (six) hours as needed.    . cyclobenzaprine (FLEXERIL) 10 MG tablet Take 10 mg by mouth 3 (three) times daily as needed for muscle spasms.    Marland Kitchen. docusate sodium (COLACE) 100 MG capsule Take 1 capsule (100 mg total) by mouth 2 (two) times daily. 10 capsule 0  . gabapentin (NEURONTIN) 300 MG capsule Take 600 mg by mouth 3 (three) times daily.     . meloxicam (MOBIC) 15 MG tablet Take 15 mg by mouth daily.     . Multiple Vitamin (MULTIVITAMIN WITH MINERALS) TABS tablet Take 1 tablet by mouth daily.    Marland Kitchen. oxybutynin (DITROPAN-XL) 5 MG 24 hr tablet Take 10 mg by mouth at bedtime.  3  . polyethylene glycol (MIRALAX / GLYCOLAX) packet Take 17 g by mouth daily. 14 each 0   No current facility-administered medications for this visit.     Allergies  Allergen Reactions  . Propoxyphene Other (See Comments)    Unspecified     REVIEW OF SYSTEMS:  (Positives checked otherwise negative)  CARDIOVASCULAR:   [ ]  chest pain,  [ ]  chest pressure,  [ ]  palpitations,  [ ]  shortness of breath when laying flat,  [ ]   shortness of breath with exertion,    pain in feet when walking,   pain in feet when laying flat,  history of blood clot in veins (DVT),   history of phlebitis,   swelling in legs,   varicose veins  PULMONARY:    productive cough,   asthma,   wheezing  NEUROLOGIC:    weakness in arms or legs,   numbness in arms or legs,   difficulty speaking or slurred speech,   temporary loss of vision in one eye,   dizziness  HEMATOLOGIC:    bleeding problems,   problems with blood clotting too easily  MUSCULOSKEL:    joint pain,  joint swelling  GASTROINTEST:    vomiting blood,   blood in stool     GENITOURINARY:    burning with urination,   blood in urine  PSYCHIATRIC:    history of major depression  INTEGUMENTARY:    rashes,   ulcers  CONSTITUTIONAL:    fever,   chills   For VQI Use Only  PRE-ADM LIVING: Home  AMB STATUS: Ambulatory   Physical Examination  Filed Vitals:   07/17/15 1321  BP: 114/72  Pulse: 98  Temp: 97.9 F (36.6 C)  TempSrc: Oral  Resp: 20  Height:  (1.651 m)  Weight: 300 lb (136.079 kg)  SpO2: 98%   Body mass index is 49.92 kg/(m^2).  Pulmonary: Sym exp, good air movt, CTAB, no rales, rhonchi, & wheezing  Cardiac: RRR, Nl S1, S2, no Murmurs, rubs  or gallops  Abdomen: inc healed  LLE: groin incision is healed, pedal pulses are palapble  LLE Venous duplex (07/17/2015 )  Resolution of prior CFV DVT   Medical Decision Making  Kristina Estes is a 45 y.o. (October 01, 1970) female  who presents s/p Repair of L CIV, ligation of distal L CIA, L CIA to CFA BPG, IVCF placement for L CFV DVT   Based on the venous duplex, the IVCF can be removed.  Will schedule patient inferior vena cavogram, IVC filter retrieval on 15 JUN 17.  I discussed with the patient the nature of angiographic procedures, especially the limited patencies of any endovascular intervention.    The patient is aware of that the risks of an angiographic procedure include but are not limited to: bleeding, infection, access site complications, renal failure, embolization, rupture of vessel, dissection, arteriovenous fistula, possible need for emergent surgical intervention, possible need for surgical procedures to treat the patient's pathology, anaphylactic reaction to contrast, and stroke and death.    The patient is aware that I will stop the retrieval if there is active clot in the IVC filter.  The patient is aware of the risks and agrees to proceed.  Thank you for allowing [place] to participate in this patient's care.   Leonides Sake, MD [facility] Office: [phone] Pager: [phone]

## 2015-07-17 ENCOUNTER — Ambulatory Visit (INDEPENDENT_AMBULATORY_CARE_PROVIDER_SITE_OTHER): Payer: BLUE CROSS/BLUE SHIELD | Admitting: Vascular Surgery

## 2015-07-17 ENCOUNTER — Encounter: Payer: Self-pay | Admitting: Vascular Surgery

## 2015-07-17 ENCOUNTER — Ambulatory Visit (HOSPITAL_COMMUNITY)
Admission: RE | Admit: 2015-07-17 | Discharge: 2015-07-17 | Disposition: A | Discharge status: Home / Self Care | Payer: PRIVATE HEALTH INSURANCE | Source: Ambulatory Visit | Attending: Vascular Surgery | Admitting: Vascular Surgery

## 2015-07-17 ENCOUNTER — Other Ambulatory Visit: Payer: Self-pay

## 2015-07-17 VITALS — BP 114/72 | HR 98 | Temp 97.9°F | Resp 20 | Ht 65.0 in | Wt 300.0 lb

## 2015-07-17 DIAGNOSIS — S35514D Injury of right iliac vein, subsequent encounter: Secondary | ICD-10-CM

## 2015-07-17 DIAGNOSIS — Z86718 Personal history of other venous thrombosis and embolism: Secondary | ICD-10-CM | POA: Diagnosis not present

## 2015-07-17 DIAGNOSIS — I82402 Acute embolism and thrombosis of unspecified deep veins of left lower extremity: Secondary | ICD-10-CM

## 2015-07-17 DIAGNOSIS — I82412 Acute embolism and thrombosis of left femoral vein: Secondary | ICD-10-CM | POA: Diagnosis present

## 2015-07-23 ENCOUNTER — Encounter (HOSPITAL_COMMUNITY): Payer: Self-pay | Admitting: Vascular Surgery

## 2015-07-23 ENCOUNTER — Ambulatory Visit (HOSPITAL_COMMUNITY)
Admission: RE | Admit: 2015-07-23 | Discharge: 2015-07-23 | Disposition: A | Discharge status: Home / Self Care | Payer: PRIVATE HEALTH INSURANCE | Source: Ambulatory Visit | Attending: Vascular Surgery | Admitting: Vascular Surgery

## 2015-07-23 ENCOUNTER — Encounter (HOSPITAL_COMMUNITY)
Admission: RE | Disposition: A | Discharge status: Home / Self Care | Payer: Self-pay | Source: Ambulatory Visit | Attending: Vascular Surgery

## 2015-07-23 DIAGNOSIS — Z6841 Body Mass Index (BMI) 40.0 and over, adult: Secondary | ICD-10-CM | POA: Insufficient documentation

## 2015-07-23 DIAGNOSIS — I82409 Acute embolism and thrombosis of unspecified deep veins of unspecified lower extremity: Secondary | ICD-10-CM | POA: Diagnosis present

## 2015-07-23 DIAGNOSIS — I82499 Acute embolism and thrombosis of other specified deep vein of unspecified lower extremity: Secondary | ICD-10-CM | POA: Diagnosis not present

## 2015-07-23 DIAGNOSIS — Z86718 Personal history of other venous thrombosis and embolism: Secondary | ICD-10-CM | POA: Insufficient documentation

## 2015-07-23 DIAGNOSIS — Z4589 Encounter for adjustment and management of other implanted devices: Secondary | ICD-10-CM | POA: Insufficient documentation

## 2015-07-23 DIAGNOSIS — K219 Gastro-esophageal reflux disease without esophagitis: Secondary | ICD-10-CM | POA: Diagnosis not present

## 2015-07-23 DIAGNOSIS — Z79899 Other long term (current) drug therapy: Secondary | ICD-10-CM | POA: Diagnosis not present

## 2015-07-23 DIAGNOSIS — Z791 Long term (current) use of non-steroidal anti-inflammatories (NSAID): Secondary | ICD-10-CM | POA: Diagnosis not present

## 2015-07-23 DIAGNOSIS — E669 Obesity, unspecified: Secondary | ICD-10-CM | POA: Insufficient documentation

## 2015-07-23 HISTORY — PX: PERIPHERAL VASCULAR CATHETERIZATION: SHX172C

## 2015-07-23 LAB — POCT I-STAT, CHEM 8
BUN: 13 mg/dL (ref 6–20)
CALCIUM ION: 1.17 mmol/L (ref 1.12–1.23)
CHLORIDE: 106 mmol/L (ref 101–111)
Creatinine, Ser: 0.8 mg/dL (ref 0.44–1.00)
GLUCOSE: 90 mg/dL (ref 65–99)
HCT: 36 % (ref 36.0–46.0)
Hemoglobin: 12.2 g/dL (ref 12.0–15.0)
Potassium: 4.4 mmol/L (ref 3.5–5.1)
SODIUM: 139 mmol/L (ref 135–145)
TCO2: 25 mmol/L (ref 0–100)

## 2015-07-23 LAB — PREGNANCY, URINE: Preg Test, Ur: NEGATIVE

## 2015-07-23 SURGERY — IVC FILTER REMOVAL

## 2015-07-23 MED ORDER — FENTANYL CITRATE (PF) 100 MCG/2ML IJ SOLN
INTRAMUSCULAR | Status: AC
  Filled 2015-07-23: qty 2

## 2015-07-23 MED ORDER — SODIUM CHLORIDE 0.9 % IV SOLN
INTRAVENOUS | Status: DC
  Administered 2015-07-23: 06:00:00 via INTRAVENOUS

## 2015-07-23 MED ORDER — ACETAMINOPHEN 325 MG PO TABS: 650.0000 mg | ORAL_TABLET | ORAL | Status: DC | PRN

## 2015-07-23 MED ORDER — MIDAZOLAM HCL 2 MG/2ML IJ SOLN
INTRAMUSCULAR | Status: AC
  Filled 2015-07-23: qty 2

## 2015-07-23 MED ORDER — FENTANYL CITRATE (PF) 100 MCG/2ML IJ SOLN
INTRAMUSCULAR | Status: DC | PRN
  Administered 2015-07-23: 25 ug via INTRAVENOUS
  Administered 2015-07-23 (×2): 50 ug via INTRAVENOUS

## 2015-07-23 MED ORDER — SODIUM CHLORIDE 0.9 % IV SOLN: 1.0000 mL/kg/h | INTRAVENOUS | Status: DC

## 2015-07-23 MED ORDER — LIDOCAINE HCL (PF) 1 % IJ SOLN
INTRAMUSCULAR | Status: AC
Start: 2015-07-23 — End: 2015-07-23
  Filled 2015-07-23: qty 30

## 2015-07-23 MED ORDER — HEPARIN (PORCINE) IN NACL 2-0.9 UNIT/ML-% IJ SOLN
INTRAMUSCULAR | Status: DC | PRN
  Administered 2015-07-23: 500 mL

## 2015-07-23 MED ORDER — MIDAZOLAM HCL 2 MG/2ML IJ SOLN
INTRAMUSCULAR | Status: DC | PRN
  Administered 2015-07-23 (×2): 1 mg via INTRAVENOUS

## 2015-07-23 MED ORDER — IODIXANOL 320 MG/ML IV SOLN
INTRAVENOUS | Status: DC | PRN
  Administered 2015-07-23: 70 mL via INTRAVENOUS

## 2015-07-23 MED ORDER — HEPARIN (PORCINE) IN NACL 2-0.9 UNIT/ML-% IJ SOLN
INTRAMUSCULAR | Status: AC
Start: 2015-07-23 — End: 2015-07-23
  Filled 2015-07-23: qty 500

## 2015-07-23 MED ORDER — LIDOCAINE HCL (PF) 1 % IJ SOLN
INTRAMUSCULAR | Status: DC | PRN
  Administered 2015-07-23: 10 mL

## 2015-07-23 SURGICAL SUPPLY — 14 items
CATH CROSS OVER TEMPO 5F (CATHETERS) ×3 IMPLANT
COVER PRB 48X5XTLSCP FOLD TPE (BAG) ×1 IMPLANT
COVER PROBE 5X48 (BAG) ×2
DEVICE ONE SNARE 15MM (VASCULAR PRODUCTS) ×3 IMPLANT
KIT MICROINTRODUCER STIFF 5F (SHEATH) ×3 IMPLANT
KIT PV (KITS) ×3 IMPLANT
SHEATH PINNACLE 6F 10CM (SHEATH) ×3 IMPLANT
SHEATH PINNACLE ST 7F 45CM (SHEATH) ×3 IMPLANT
SYR MEDRAD MARK V 150ML (SYRINGE) ×3 IMPLANT
TRANSDUCER W/STOPCOCK (MISCELLANEOUS) ×3 IMPLANT
TRAY PV CATH (CUSTOM PROCEDURE TRAY) ×3 IMPLANT
TUBING HIGH PRESSURE 120CM (CONNECTOR) ×3 IMPLANT
WIRE BENTSON .035X145CM (WIRE) ×3 IMPLANT
WIRE ROSEN-J .035X260CM (WIRE) ×3 IMPLANT

## 2015-07-23 NOTE — Discharge Instructions (Signed)
Venogram// IVC filter removal, Care After Refer to this sheet in the next few weeks. These instructions provide you with information on caring for yourself after your procedure. Your health care provider may also give you more specific instructions. Your treatment has been planned according to current medical practices, but problems sometimes occur. Call your health care provider if you have any problems or questions after your procedure. WHAT TO EXPECT AFTER THE PROCEDURE After your procedure, it is typical to have the following sensations:  Mild discomfort at the catheter insertion site. HOME CARE INSTRUCTIONS   Take all medicines exactly as directed.  Follow any prescribed diet.  Follow instructions regarding both rest and physical activity.  Drink more fluids for the first several days after the procedure in order to help flush dye from your kidneys. SEEK MEDICAL CARE IF:  You develop a rash.  You have fever not controlled by medicine. SEEK IMMEDIATE MEDICAL CARE IF:  There is pain, drainage, bleeding// hold pressure for 20 minutes or longer, seek help if you have continued bleeding, redness, swelling, warmth or a red streak at the site of the IV tube.  The extremity where your IV tube was placed becomes discolored, numb, or cool.  You have difficulty breathing or shortness of breath.  You develop chest pain.  You have excessive dizziness or fainting.   This information is not intended to replace advice given to you by your health care provider. Make sure you discuss any questions you have with your health care provider.   Document Released: 11/14/2012 Document Revised: 01/29/2013 Document Reviewed: 11/14/2012 Elsevier Interactive Patient Education [employer]2016 Elsevier Inc.

## 2015-07-23 NOTE — Interval H&P Note (Signed)
History and Physical Interval Note:  07/23/2015 7:14 AM  Kristina Estes  has presented today for surgery, with the diagnosis of completion of therapy  The various methods of treatment have been discussed with the patient and family. After consideration of risks, benefits and other options for treatment, the patient has consented to  Procedure(s): IVC Filter Removal (N/A) as a surgical intervention .  The patient's history has been reviewed, patient examined, no change in status, stable for surgery.  I have reviewed the patient's chart and labs.  Questions were answered to the patient's satisfaction.     Leonides Sakehen, Nicky Kras

## 2015-07-23 NOTE — H&P (View-Only) (Signed)
Postoperative Visit   History of Present Illness  Kristina Estes is a 45 y.o. (01-11-1971) female  who presents for postoperative follow-up for: Venous duplex evaluation of LLE.  The patient had a IVC filter placement due to bleeding complication with anticoagulation.  1. Xlap, repair L iliac vein, ligation of distal left CIA, L CIA to L CFA BPG, VAC (03/29/15) done for L CIV and CIA injury during spine case 2. Multiple abd washout and VAC (2/19, 2/21, 2/23, 2/24) 3. Abd closure with mesh by Dr. Derrell Lolling (04/08/15) 4. Evac hematoma by Dr. Derrell Lolling (04/13/15) 5. IVCF placement by Dr. Myra Gianotti (04/14/15) 6. Repeat evac hematoma by Dr. Derrell Lolling (04/22/15)  The patient had her spine instrumentation with Dr. Conchita Paris on 04/22/15. The patient's wounds are healed. The patient notes no lower extremity symptoms. The patient is able to complete their activities of daily living. The patient's current symptoms are: deep LLQ pain.    Past Medical History  Diagnosis Date  . Anginal pain (HCC) 2016    received referral to [facility]  . Dysrhythmia   . Asthma     as a child  . GERD (gastroesophageal reflux disease)   . Arthritis   . Obesity   . Difficult intravenous access 03/27/2015    Patient has no PIV targets, will require central line or PICC anytime general anesthesia is needed    Past Surgical History  Procedure Laterality Date  . Ectopic pregnancy surgery  1998    [place], [place]   . Carpal tunnel release Bilateral 2004    [facility]  . Cholecystectomy  2014    [facility] in [place]  . Diagnostic laparoscopy  2014  . Repair knee ligament Right 2014    [place], [place]  . Tonsillectomy    . Tonsillectomy and adenoidectomy  1998  . Fracture surgery      left foot   . Wisdom tooth extraction    . Colonoscopy    . Abdominal aortic aneurysm repair Left 03/27/2015    Procedure: Repair Left Common Iliac Vein, Ligation of Left Iliac Artery, Left Common  Ileo-Femoral Bypass Graft;  Surgeon: Fransisco Hertz, MD;  Location: [facility] OR;  Service: Vascular;  Laterality: Left;  . Repair iliac artery Left 03/27/2015    Procedure: REPAIR ILIAC ARTERY with graft from Left Common Iliac to Left Femoral Artery;  Surgeon: Fransisco Hertz, MD;  Location: [facility] OR;  Service: Vascular;  Laterality: Left;  . Abdominal exposure N/A 03/29/2015    Procedure: ABDOMINAL WASHOUT AND APPLICATION OF WOUND VAC;  Surgeon: Fransisco Hertz, MD;  Location: [facility] OR;  Service: Vascular;  Laterality: N/A;  . Abdominal wound dehiscence N/A 03/31/2015    Procedure: ABDOMINAL WASHOUT; POSSIBLE ABDOMINAL CLOSURE;  Surgeon: Fransisco Hertz, MD;  Location: [facility] OR;  Service: Vascular;  Laterality: N/A;  . Application of wound vac N/A 03/31/2015    Procedure: APPLICATION OF WOUND VAC;  Surgeon: Fransisco Hertz, MD;  Location: [facility] OR;  Service: Vascular;  Laterality: N/A;  . Abdominal wound dehiscence N/A 04/02/2015    Procedure: ABDOMINAL WASHOUT;  Surgeon: Fransisco Hertz, MD;  Location: [facility] OR;  Service: Vascular;  Laterality: N/A;  . Application of wound vac N/A 04/02/2015    Procedure: APPLICATION OF WOUND VAC;  Surgeon: Fransisco Hertz, MD;  Location: [facility] OR;  Service: Vascular;  Laterality: N/A;  . Abdominal wound dehiscence N/A 04/06/2015    Procedure: ABDOMINAL WASHOUT;  Surgeon: Arlys John  Rolena InfanteL Toshua Honsinger, MD;  [facility];  Service: Vascular;  Laterality: N/A;  . Application of wound vac N/A 04/06/2015    Procedure: APPLICATION OF WOUND VAC;  Surgeon: Fransisco HertzBrian L Vonette Grosso, MD;  [facility];  Service: Vascular;  Laterality: N/A;  . Laparotomy N/A 04/08/2015    Procedure: EXPLORATORY LAPAROTOMY/ABD. WALL CLOSURE;  Surgeon: Axel FillerArmando Ramirez, MD;  [facility];  Service: General;  Laterality: N/A;  . Laparotomy N/A 04/13/2015    Procedure: EXPLORATORY LAPAROTOMY;  Surgeon: Claud KelpHaywood Ingram, MD;  [facility];  Service: General;  Laterality: N/A;  . Application of wound vac N/A 04/13/2015    Procedure: APPLICATION OF WOUND VAC;  Surgeon:  Claud KelpHaywood Ingram, MD;  [facility]: [facility] OR;  Service: General;  Laterality: N/A;  . Vena cava filter placement N/A 04/14/2015    Procedure: INSERTION VENA-CAVA FILTER;  Surgeon: Nada LibmanVance W Brabham, MD;  [facility];  Service: Vascular;  Laterality: N/A;  . Dressing change under anesthesia N/A 04/15/2015    Procedure: Exploration of abdominal wound;  Surgeon: Claud KelpHaywood Ingram, MD;  Location: [facility] OR;  Service: General;  Laterality: N/A;  . Wound debridement Bilateral 04/15/2015    Procedure: CLOSURE OF ABDOMINAL WOUND;  Surgeon: Claud KelpHaywood Ingram, MD;  Location: [facility] OR;  Service: General;  Laterality: Bilateral;  . Transforaminal lumbar interbody fusion (tlif) with pedicle screw fixation 1 level N/A 04/22/2015    Procedure: LUMBAR FIVE-SACRAL ONE TRANSFORAMINAL LUMBAR INTERBODY FUSION WITH PEDICLE SCREW FIXATION;  Surgeon: Lisbeth RenshawNeelesh Nundkumar, MD;  [facility]: [facility];  Service: Neurosurgery;  Laterality: N/A;    Social History   Social History  . Marital Status: Married    Spouse Name: N/A  . Number of Children: N/A  . Years of Education: N/A   Occupational History  . Not on file.   Social History Main Topics  . Smoking status: Never Smoker   . Smokeless tobacco: Never Used  . Alcohol Use: No  . Drug Use: No  . Sexual Activity: Not on file   Other Topics Concern  . Not on file   Social History Narrative    No family history on file.  Current Outpatient Prescriptions  Medication Sig Dispense Refill  . aspirin-sod bicarb-citric acid (ALKA-SELTZER) 325 MG TBEF tablet Take 325 mg by mouth every 6 (six) hours as needed.    . cyclobenzaprine (FLEXERIL) 10 MG tablet Take 10 mg by mouth 3 (three) times daily as needed for muscle spasms.    Marland Kitchen. docusate sodium (COLACE) 100 MG capsule Take 1 capsule (100 mg total) by mouth 2 (two) times daily. 10 capsule 0  . gabapentin (NEURONTIN) 300 MG capsule Take 600 mg by mouth 3 (three) times daily.     . meloxicam (MOBIC) 15 MG tablet Take 15 mg by mouth daily.     . Multiple Vitamin (MULTIVITAMIN WITH MINERALS) TABS tablet Take 1 tablet by mouth daily.    Marland Kitchen. oxybutynin (DITROPAN-XL) 5 MG 24 hr tablet Take 10 mg by mouth at bedtime.  3  . polyethylene glycol (MIRALAX / GLYCOLAX) packet Take 17 g by mouth daily. 14 each 0   No current facility-administered medications for this visit.     Allergies  Allergen Reactions  . Propoxyphene Other (See Comments)    Unspecified     REVIEW OF SYSTEMS:  (Positives checked otherwise negative)  CARDIOVASCULAR:   [ ]  chest pain,  [ ]  chest pressure,  [ ]  palpitations,  [ ]  shortness of breath when laying flat,  [ ]   shortness of breath with exertion,    pain in feet when walking,   pain in feet when laying flat,  history of blood clot in veins (DVT),   history of phlebitis,   swelling in legs,   varicose veins  PULMONARY:    productive cough,   asthma,   wheezing  NEUROLOGIC:    weakness in arms or legs,   numbness in arms or legs,   difficulty speaking or slurred speech,   temporary loss of vision in one eye,   dizziness  HEMATOLOGIC:    bleeding problems,   problems with blood clotting too easily  MUSCULOSKEL:    joint pain,  joint swelling  GASTROINTEST:    vomiting blood,   blood in stool     GENITOURINARY:    burning with urination,   blood in urine  PSYCHIATRIC:    history of major depression  INTEGUMENTARY:    rashes,   ulcers  CONSTITUTIONAL:    fever,   chills   For VQI Use Only  PRE-ADM LIVING: Home  AMB STATUS: Ambulatory   Physical Examination  Filed Vitals:   07/17/15 1321  BP: 114/72  Pulse: 98  Temp: 97.9 F (36.6 C)  TempSrc: Oral  Resp: 20  Height:  (1.651 m)  Weight: 300 lb (136.079 kg)  SpO2: 98%   Body mass index is 49.92 kg/(m^2).  Pulmonary: Sym exp, good air movt, CTAB, no rales, rhonchi, & wheezing  Cardiac: RRR, Nl S1, S2, no Murmurs, rubs  or gallops  Abdomen: inc healed  LLE: groin incision is healed, pedal pulses are palapble  LLE Venous duplex (07/17/2015 )  Resolution of prior CFV DVT   Medical Decision Making  Kristina Estes is a 45 y.o. (October 01, 1970) female  who presents s/p Repair of L CIV, ligation of distal L CIA, L CIA to CFA BPG, IVCF placement for L CFV DVT   Based on the venous duplex, the IVCF can be removed.  Will schedule patient inferior vena cavogram, IVC filter retrieval on 15 JUN 17.  I discussed with the patient the nature of angiographic procedures, especially the limited patencies of any endovascular intervention.    The patient is aware of that the risks of an angiographic procedure include but are not limited to: bleeding, infection, access site complications, renal failure, embolization, rupture of vessel, dissection, arteriovenous fistula, possible need for emergent surgical intervention, possible need for surgical procedures to treat the patient's pathology, anaphylactic reaction to contrast, and stroke and death.    The patient is aware that I will stop the retrieval if there is active clot in the IVC filter.  The patient is aware of the risks and agrees to proceed.  Thank you for allowing [place] to participate in this patient's care.   Leonides Sake, MD [facility] Office: [phone] Pager: [phone]

## 2015-07-23 NOTE — Op Note (Signed)
   OPERATIVE NOTE   PROCEDURE: 1.  Right internal jugular cannulation under ultrasound guidance 2.  Inferior vena cavagram 3.  Retrieval of inferior vena cava filter  PRE-OPERATIVE DIAGNOSIS: s/p inferior vena cava filter placement for deep vein thrombosis   POST-OPERATIVE DIAGNOSIS: same as above   SURGEON: Kristina SakeBrian Leone Mobley, MD  ANESTHESIA: conscious sedation  ESTIMATED BLOOD LOSS: 50 cc  CONTRAST: 70 cc  FINDING(S):  No evidence of thrombus in inferior vena cava filter  Successful retrieval of inferior vena cava filter  SPECIMEN(S):  none  INDICATIONS:   Kristina Estes is a 45 y.o. female who presents with s/p IVC filter placement for DVT.  Her outpatient studies demonstrated complete resolution of her prior DVT, so I recommended: IVC filter removal.  I discussed with the patient the nature of angiographic procedures, especially the limited patencies of any endovascular intervention.  The patient is aware of that the risks of an angiographic procedure include but are not limited to: bleeding, infection, access site complications, renal failure, embolization, rupture of vessel, dissection, possible need for emergent surgical intervention, possible need for surgical procedures to treat the patient's pathology, and stroke and death.  The patient is aware of the risks and agrees to proceed.  DESCRIPTION: After full informed consent was obtained from the patient, the patient was brought back to the angiography suite.  The patient was placed supine upon the angiography table and connected to cardiopulmonary monitoring equipment.  The patient was then given conscious sedation, the amounts of which are documented in the patient's chart.  A circulating radiologic technician maintained continuous monitoring of the patient's cardiopulmonary status.  Additionally, the control room radiologic technician provided backup monitoring throughout the procedure.  The patient was prepped and drape in the  standard fashion for an angiographic procedure.  At this point, attention was turned to the right neck.  Under Sonosite guidance, I cannulated the right internal jugular vein with a micropuncture needle.  I loaded the microwire through the needle and then exchanged the needle for the microsheath.  I loaded a Bentson wire through the sheath.  The wire continued to advance into the right ventricle.  I exchanged the microsheath for a 6-Fr sheath.  I placed a crosser catheter over the wire and using this combination was able to get into the inferior vena cava.  I exchanged the wire for a Rosen wire which was advanced into the right iliac vein, past the filter.  I exchanged the sheath for a long 7-Fr sheath.  The snare catheter was loaded over the wire.  Care was taken to avoid catching the IVC filter.  I exchange the wire for the One Snare.  Using this snare, I placed it around the top of the IVC filter.  I advanced the snare catheter to capture the hook.  I advanced the 7-Fr sheath over the snare catheter.  I encountered some limited resistance with advancing the sheath over the IVC filter.  This recaptured the filter, causing it to collapse.  I pulled the filter fully into the 7-Fr sheath.  I removed the 7-Fr sheath with the IVC filter, snare catheter, and snare out the right neck together.  Pressure was applied to the right neck for 5 minutes.  No further bleeding was noted.   COMPLICATIONS: none  CONDITION: stable  Kristina SakeBrian Merit Maybee, MD [facility] [place] Office: [phone] Pager: [phone]  07/23/2015, 8:24 AM

## 2015-07-25 ENCOUNTER — Telehealth: Payer: Self-pay | Admitting: Vascular Surgery

## 2015-07-25 NOTE — Telephone Encounter (Signed)
-----   Message from Sharee PimpleMarilyn K McChesney, RN sent at 07/23/2015 10:11 AM EDT ----- Regarding: schedule   ----- Message -----    From: Fransisco HertzBrian L Chen, MD    Sent: 07/23/2015   8:36 AM      To: Vvs Charge Pool  Jackquline BoschLaverne Estes [id number] 09/19/1970  PROCEDURE: 1.  Right internal jugular cannulation under ultrasound guidance 2.  Inferior vena cavagram 3.  Retrieval of inferior vena cava filter  Follow-up: 4 weeks

## 2015-07-25 NOTE — Telephone Encounter (Signed)
Sched appt 7/21 at 8:45. Ph# ahs no vm, mailed appt letter through regular mail to inform pt.

## 2015-08-04 ENCOUNTER — Telehealth: Payer: Self-pay

## 2015-08-04 NOTE — Telephone Encounter (Signed)
Attempted to return call to pt. Per her request on Triage voice mail.  Unable to contact pt. At this time; unable to leave message as no voice mail was set up.

## 2015-08-06 NOTE — Telephone Encounter (Signed)
Able to contact pt. per phone.  Stated she questioned being able to return to work, and has since rec'd Short Term Disability paperwork, completed by her Neurosurgeon, that will cover her, until she follows up with Dr. Imogene Burnhen and Dr. Conchita ParisNundkumar.  Denied needing anything further from Dr. Imogene Burnhen at this time.  Next f/u appt. With Dr. Imogene Burnhen is 08/28/15 @ 8:45 AM; reminded pt. Of appt.  Verb. Understanding.

## 2015-08-21 ENCOUNTER — Encounter: Payer: Self-pay | Admitting: Vascular Surgery

## 2015-08-24 NOTE — Progress Notes (Signed)
This encounter did not occur on the scheduled date. This encounter was created in error - please disregard. 

## 2015-08-28 ENCOUNTER — Encounter: Payer: PRIVATE HEALTH INSURANCE | Admitting: Vascular Surgery

## 2015-09-04 ENCOUNTER — Encounter: Payer: Self-pay | Admitting: Vascular Surgery

## 2015-09-08 DIAGNOSIS — Z95828 Presence of other vascular implants and grafts: Secondary | ICD-10-CM | POA: Insufficient documentation

## 2015-09-08 NOTE — Progress Notes (Signed)
Postoperative Visit   History of Present Illness Kristina Estes is a 45 y.o. (Aug 16, 1970) female who presents for postoperative s/p IVC filter retrieval on 07/23/15.  The patient had a IVC filter placement due to bleeding complication with anticoagulation.  Other procedures included: 1. Xlap, repair L iliac vein, ligation of distal left CIA, L CIA to L CFA BPG, VAC (03/29/15) done for L CIV and CIA injury during spine case 2. Multiple abd washout and VAC (2/19, 2/21, 2/23, 2/24) 3. Abd closure with mesh by Dr. Derrell Lolling (04/08/15) 4. Evac hematoma by Dr. Derrell Lolling (04/13/15) 5. IVCF placement by Dr. Myra Gianotti (04/14/15) 6. Repeat evac hematoma by Dr. Derrell Lolling (04/22/15)  The patient had her spine instrumentation with Dr. Conchita Paris on 04/22/15. The patient's wounds are healed. The patient notes no lower extremity symptoms. The patient is not able to complete their activities of daily living. The patient's current symptoms are: weakness and fatigue.  She is getting additional spine studies completed.  She notes a mid-line hernia with increased abdominal pressure.   Past Medical History, Past Surgical History, Social History, Family History, Medications, Allergies, and Review of Systems are unchanged from previous evaluation on 07/23/15  Current Outpatient Prescriptions  Medication Sig Dispense Refill  . cyclobenzaprine (FLEXERIL) 10 MG tablet Take 10 mg by mouth 2 (two) times daily.     Marland Kitchen docusate sodium (COLACE) 100 MG capsule Take 1 capsule (100 mg total) by mouth 2 (two) times daily. 10 capsule 0  . gabapentin (NEURONTIN) 300 MG capsule Take 600 mg by mouth 2 (two) times daily.     . IRON PO Take 1 tablet by mouth daily.    . meloxicam (MOBIC) 15 MG tablet Take 15 mg by mouth daily.    . Multiple Vitamin (MULTIVITAMIN WITH MINERALS) TABS tablet Take 1 tablet by mouth daily.    Marland Kitchen oxybutynin (DITROPAN-XL) 5 MG 24 hr tablet Take 10 mg by mouth at bedtime.  3  . polyethylene glycol (MIRALAX / GLYCOLAX)  packet Take 17 g by mouth daily. 14 each 0   No current facility-administered medications for this visit.      For VQI Use Only  PRE-ADM LIVING: Home  AMB STATUS: Ambulatory  Physical Examination  Vitals:   09/10/15 1322  BP: 114/73  Pulse: 91  Resp: 14  Temp: 97.6 F (36.4 C)  SpO2: 98%  Weight: (!) 308 lb (139.7 kg)   Body mass index is 51.25 kg/m.  General: A&O x 3, WD, morbidly obese  Pulmonary: Sym exp, good air movt, CTAB, no rales, rhonchi, & wheezing  Cardiac: RRR, Nl S1, S2, no Murmurs, rubs or gallops  Vascular: Vessel Right Left  Radial Palpable Palpable  Brachial Palpable Palpable  Carotid Palpable, without bruit Palpable, without bruit  Aorta Not palpable due to pannus N/A  Femoral Faintly Palpable Faintly Palpable  Popliteal Not palpable Not palpable  PT Palpable Palpable  DP Palpable Palpable    Gastrointestinal: soft, NTND, -G/R, - HSM, - masses, - CVAT B, incision healed, some mid-line fascia laxity palpable  Musculoskeletal: M/S 5/5 throughout , Extremities without ischemic changes , R neck without hematoma, no echymosis present at cannulation site  Neurologic:  Pain and light touch intact in extremities , Motor exam as listed above  Medical Decision Making  Kristina Estes is a 45 y.o. female who presents s/p Repair of L CIV, ligation of distal L CIA, L CIA to CFA BPG, IVCF placement for L CFV DVT, IVC filter removal . Based  on his angiographic findings, this patient needs: q3 ABI and Aortoiliac duplex. I discussed in depth with the patient the nature of atherosclerosis, and emphasized the importance of maximal medical management including strict control of blood pressure, blood glucose, and lipid levels, obtaining regular exercise, and cessation of smoking.  The patient is aware that without maximal medical management the underlying atherosclerotic disease process will progress, limiting the benefit of any interventions. The patient is  currently not on a statin.  Lipid panel needs to be ordered and review with her PCP. The patient is currently not on an anti-platelet.  The patient will be started on ASA 81 mg PO daily. In regards to the mid-line hernia, this is not an unexpected finding.  Per my discussion with Gen Surgery at the time of her repair, this was going to be a likely outcome.   At some point in the future, she may be a candidate for a redo [facility] repair.  Literature suggests that weight loss can help facilitate the success of subsequent repairs. She is going to follow up with her CCS for this hernia.  Thank you for allowing [place] to participate in this patient's care.  Leonides Sake, MD, FACS [facility] [place] Office: [phone] Pager: [phone]

## 2015-09-10 ENCOUNTER — Ambulatory Visit (INDEPENDENT_AMBULATORY_CARE_PROVIDER_SITE_OTHER): Payer: PRIVATE HEALTH INSURANCE | Admitting: Vascular Surgery

## 2015-09-10 ENCOUNTER — Encounter: Payer: Self-pay | Admitting: Vascular Surgery

## 2015-09-10 VITALS — BP 114/73 | HR 91 | Temp 97.6°F | Resp 14 | Wt 308.0 lb

## 2015-09-10 DIAGNOSIS — S35514D Injury of right iliac vein, subsequent encounter: Secondary | ICD-10-CM

## 2015-09-10 DIAGNOSIS — Z95828 Presence of other vascular implants and grafts: Secondary | ICD-10-CM | POA: Diagnosis not present

## 2015-09-10 DIAGNOSIS — S35512D Injury of left iliac artery, subsequent encounter: Secondary | ICD-10-CM

## 2015-09-10 DIAGNOSIS — I82402 Acute embolism and thrombosis of unspecified deep veins of left lower extremity: Secondary | ICD-10-CM | POA: Diagnosis not present

## 2015-09-10 NOTE — Addendum Note (Signed)
Addended by: Sharee Pimple on: 09/10/2015 04:48 PM   Modules accepted: Orders

## 2015-10-06 DIAGNOSIS — Z0279 Encounter for issue of other medical certificate: Secondary | ICD-10-CM | POA: Diagnosis not present

## 2015-10-27 ENCOUNTER — Telehealth: Payer: Self-pay

## 2015-10-27 DIAGNOSIS — M79605 Pain in left leg: Secondary | ICD-10-CM

## 2015-10-27 DIAGNOSIS — M7989 Other specified soft tissue disorders: Secondary | ICD-10-CM

## 2015-10-27 NOTE — Telephone Encounter (Signed)
rec'd phone call from pt.  Reported "I'm having problems with my left leg; I am losing my balance more, and as I lose my balance, I feel like I'm pulled toward the left side."  C/o her left leg feeling "heavier."  Reported swelling in left LE from toes to the groin.  Stated she gets a pain in the left anterior thigh, if she puts majority of her weight on the left leg.  Denied any localized area of redness, inflammation, or tenderness.  Described her left leg as feeling "jelly-like" when she starts to walk.  Reported she went to her Neurologist today, but didn't receive any new information, other than it was noted she had gained weight.  Will inform Dr. Imogene Burnhen of symptoms.  Advised will call pt. tomorrow with any recommendations for f/u.  Verb. Understanding.

## 2015-10-28 NOTE — Telephone Encounter (Signed)
RE: update/ request recommendation  Received: Today  Message Contents  Kristina HertzBrian L Chen, MD  Phillips Odorarol S Vanden Fawaz, RN        Can bring her in next Thursday with BLE ABI, aortoiliac duplex and LLE DVT duplex   Previous Messages    ----- Message -----  From: Phillips Odorarol S Njeri Vicente, RN  Sent: 10/27/2015  5:09 PM  To: Kristina HertzBrian L Chen, MD  Subject: update/ request recommendation          Please advise if this pt. needs a left LE venous duplex. (her next appt. Is scheduled in Nov. with Aorta/Iliac/IVC duplex and ABI's)

## 2015-10-29 NOTE — Telephone Encounter (Signed)
I spoke w/ the patient and she is out of town until 11/09/15. I scheduled her for the US studies and to see [facility] on 11/16/15. Pt is aware of these appts.awt

## 2015-11-10 ENCOUNTER — Encounter: Payer: Self-pay | Admitting: Vascular Surgery

## 2015-11-16 ENCOUNTER — Ambulatory Visit (INDEPENDENT_AMBULATORY_CARE_PROVIDER_SITE_OTHER)
Admission: RE | Admit: 2015-11-16 | Discharge: 2015-11-16 | Disposition: A | Discharge status: Home / Self Care | Payer: BLUE CROSS/BLUE SHIELD | Source: Ambulatory Visit | Attending: Vascular Surgery | Admitting: Vascular Surgery

## 2015-11-16 ENCOUNTER — Ambulatory Visit (INDEPENDENT_AMBULATORY_CARE_PROVIDER_SITE_OTHER): Payer: BLUE CROSS/BLUE SHIELD | Admitting: Vascular Surgery

## 2015-11-16 ENCOUNTER — Ambulatory Visit (HOSPITAL_COMMUNITY)
Admission: RE | Admit: 2015-11-16 | Discharge: 2015-11-16 | Disposition: A | Discharge status: Home / Self Care | Payer: BLUE CROSS/BLUE SHIELD | Source: Ambulatory Visit | Attending: Vascular Surgery | Admitting: Vascular Surgery

## 2015-11-16 ENCOUNTER — Encounter: Payer: Self-pay | Admitting: Vascular Surgery

## 2015-11-16 VITALS — BP 126/92 | HR 82 | Temp 96.8°F | Resp 16 | Ht 65.0 in | Wt 307.0 lb

## 2015-11-16 DIAGNOSIS — S35514D Injury of right iliac vein, subsequent encounter: Secondary | ICD-10-CM | POA: Diagnosis not present

## 2015-11-16 DIAGNOSIS — Z95828 Presence of other vascular implants and grafts: Secondary | ICD-10-CM

## 2015-11-16 DIAGNOSIS — S35512D Injury of left iliac artery, subsequent encounter: Secondary | ICD-10-CM

## 2015-11-16 DIAGNOSIS — M79605 Pain in left leg: Secondary | ICD-10-CM

## 2015-11-16 DIAGNOSIS — X58XXXD Exposure to other specified factors, subsequent encounter: Secondary | ICD-10-CM | POA: Diagnosis not present

## 2015-11-16 DIAGNOSIS — I82402 Acute embolism and thrombosis of unspecified deep veins of left lower extremity: Secondary | ICD-10-CM

## 2015-11-16 DIAGNOSIS — I824Z9 Acute embolism and thrombosis of unspecified deep veins of unspecified distal lower extremity: Secondary | ICD-10-CM

## 2015-11-16 DIAGNOSIS — M7989 Other specified soft tissue disorders: Secondary | ICD-10-CM | POA: Diagnosis not present

## 2015-11-16 NOTE — Addendum Note (Signed)
Addended by: Yolonda KidaEVANS, Mykenzi Vanzile N on: 11/16/2015 01:55 PM   Modules accepted: Orders

## 2015-11-16 NOTE — Progress Notes (Signed)
Postoperative Visit   History of Present Illness Kristina Estes is a 45 y.o. (1970/03/01) female who presents for cc: unsteady L leg gait and swelling in L leg.    Prior procedures included: 1. IVC filter retrieval (07/23/15) 2. Xlap, repair L iliac vein, ligation of distal left CIA, L CIA to L CFA BPG, VAC (03/29/15) done for L CIV and CIA injury during spine case 3. Multiple abd washout and VAC (2/19, 2/21, 2/23, 2/24) 4. Abd closure with mesh by Dr. Derrell Lolling (04/08/15) 5. Evac hematoma by Dr. Derrell Lolling (04/13/15) 6. IVCF placement by Dr. Myra Gianotti (04/14/15) 7. Repeat evac hematoma by Dr. Derrell Lolling (04/22/15)  The patient had her spine instrumentation with Dr. Conchita Paris on 04/22/15.   She denies any intermittent claudication or rest pain.   Past Medical History, Past Surgical History, Social History, Family History, Medications, Allergies, and Review of Systems are unchanged from previous evaluation on 09/10/15 Current Outpatient Prescriptions  Medication Sig Dispense Refill  . cyclobenzaprine (FLEXERIL) 10 MG tablet Take 15 mg by mouth 2 (two) times daily.     Marland Kitchen docusate sodium (COLACE) 100 MG capsule Take 1 capsule (100 mg total) by mouth 2 (two) times daily. 10 capsule 0  . IRON PO Take 1 tablet by mouth daily.    . meloxicam (MOBIC) 15 MG tablet Take 15 mg by mouth daily.    . Multiple Vitamin (MULTIVITAMIN WITH MINERALS) TABS tablet Take 1 tablet by mouth daily.    Marland Kitchen oxybutynin (DITROPAN-XL) 5 MG 24 hr tablet Take 10 mg by mouth at bedtime.  3  . polyethylene glycol (MIRALAX / GLYCOLAX) packet Take 17 g by mouth daily. 14 each 0  . topiramate (TOPAMAX) 50 MG tablet Take 50 mg by mouth 2 (two) times daily.    Marland Kitchen zolpidem (AMBIEN) 10 MG tablet Take 10 mg by mouth at bedtime as needed for sleep.     No current facility-administered medications for this visit.       For VQI Use Only  PRE-ADM LIVING: Home  AMB STATUS: Ambulatory  Physical Examination Vitals:   11/16/15 1007    BP: (!) 126/92  Pulse: 82  Resp: 16  Temp: (!) 96.8 F (36 C)  TempSrc: Oral  SpO2: 100%  Weight: (!) 307 lb (139.3 kg)  Height: 5\' 5"  (1.651 m)   Body mass index is 51.09 kg/m.  General: A&O x 3, WD, morbidly obese  Pulmonary: Sym exp, good air movt, CTAB, no rales, rhonchi, & wheezing  Cardiac: RRR, Nl S1, S2, no Murmurs, rubs or gallops  Vascular: Vessel Right Left  Radial Palpable Palpable  Brachial Palpable Palpable  Carotid Palpable, without bruit Palpable, without bruit  Aorta Not palpable due to pannus N/A  Femoral Faintly Palpable Faintly Palpable  Popliteal Not palpable Not palpable  PT Palpable Palpable  DP Palpable Palpable    Gastrointestinal: soft, NTND, -G/R, - HSM, - masses, - CVAT B, incision healed, some mid-line fascia laxity palpable  Musculoskeletal: M/S 5/5 throughout , Extremities without ischemic changes , R neck without hematoma, no echymosis present at cannulation site, mild asx: L calf slightly bigger than R, minimal edema in both legs  Neurologic:  Pain and light touch intact in extremities , Motor exam as listed above  B venous duplex (11/16/2015)  No DVT or SVT  Decreased L leg venous spontaneity and phasicity suggestive of possible obstruction at left iliac vein level  Aortoiliac duplex (11/16/2015)  Patent L CIA to CFA bypass  Atypical L  EIV waveforms  ABI (Date: 11/16/2015)  R:   ABI: 1.10 (0.96),   DP: tri  PT: tri  TBI:  0.87  L:   ABI: 1.15 (0.86),   DP: tri  PT: tri  TBI: 0.83  Medical Decision Making  Atiya Yera is a 45 y.o. female who presents s/p Repair of L CIV, ligation of distal L CIA, L CIA to CFA BPG, IVCF placement for L CFV DVT, IVC filter removal .  Doubt her sx are related to her arterial bypass or L CIV injury.  Would not surprise me if the L CIV is occluded.  Her clinical picture is however very benign if even if she has occluded, she has re-routed via collaterals.  Will get  CT venogram to make certain she does not have a May Thurner's compression on the L CIV.   She will follow up in 2-4 weeks with this study.  Thank you for allowing [place] to participate in this patient's care.  Leonides Sake, MD, FACS [facility] [place] Office: [phone] Pager: [phone]

## 2015-11-17 NOTE — Addendum Note (Signed)
Addended by: Melodye PedMANESS-HARRISON, Teisha Trowbridge C on: 11/17/2015 10:07 AM   Modules accepted: Orders

## 2015-12-11 ENCOUNTER — Other Ambulatory Visit: Payer: PRIVATE HEALTH INSURANCE

## 2015-12-11 ENCOUNTER — Ambulatory Visit: Payer: PRIVATE HEALTH INSURANCE | Admitting: Vascular Surgery

## 2015-12-18 ENCOUNTER — Encounter (HOSPITAL_COMMUNITY): Payer: PRIVATE HEALTH INSURANCE

## 2015-12-18 ENCOUNTER — Ambulatory Visit: Payer: PRIVATE HEALTH INSURANCE | Admitting: Vascular Surgery

## 2015-12-28 ENCOUNTER — Other Ambulatory Visit: Payer: BLUE CROSS/BLUE SHIELD

## 2016-01-08 ENCOUNTER — Ambulatory Visit: Payer: BLUE CROSS/BLUE SHIELD | Admitting: Vascular Surgery

## 2016-02-23 ENCOUNTER — Encounter: Payer: Self-pay | Admitting: Vascular Surgery

## 2016-02-25 ENCOUNTER — Other Ambulatory Visit: Payer: Self-pay | Admitting: *Deleted

## 2016-02-25 DIAGNOSIS — S35515D Injury of left iliac vein, subsequent encounter: Secondary | ICD-10-CM

## 2016-02-25 NOTE — Progress Notes (Signed)
Establish Bypass Patient   History of Present Illness Kristina Baynesis a 46 y.o.([birth year]) femalewho presents for cc: L side abd bulge.  Her current sx are: increased L side abd pain with increased bulging.  She also notes increased sensation of L leg swelling and having problems laying on L side.  She returns after a CTV to evaluate the L iliac venous system that previously had been injured during her spinal procedure.  Prior procedures included: 1. IVC filter retrieval (07/23/15) 2. Xlap, repair L iliac vein, ligation of distal left CIA, L CIA to L CFA BPG, VAC (03/29/15) done for L CIV and CIA injury during spine case 3. Multiple abd washout and VAC (2/19, 2/21, 2/23, 2/24) 4. Abd closure with mesh by Dr. Derrell Lolling (04/08/15) 5. Evac hematoma by Dr. Derrell Lolling (04/13/15) 6. IVCF placement by Dr. Myra Gianotti (04/14/15) 7. Repeat evac hematoma by Dr. Derrell Lolling (04/22/15)  The patient had her spine instrumentation with Dr. Conchita Paris on 04/22/15.   She denies any intermittent claudication or rest pain.   Past Medical History, Past Surgical History, Social History, Family History, Medications, Allergies, and Review of Systems are unchanged from previous evaluation on 11/16/15  Current Outpatient Prescriptions  Medication Sig Dispense Refill  . cyclobenzaprine (FLEXERIL) 10 MG tablet Take 15 mg by mouth 2 (two) times daily.     Marland Kitchen docusate sodium (COLACE) 100 MG capsule Take 1 capsule (100 mg total) by mouth 2 (two) times daily. 10 capsule 0  . IRON PO Take 1 tablet by mouth daily.    . meloxicam (MOBIC) 15 MG tablet Take 15 mg by mouth daily.    . Multiple Vitamin (MULTIVITAMIN WITH MINERALS) TABS tablet Take 1 tablet by mouth daily.    . polyethylene glycol (MIRALAX / GLYCOLAX) packet Take 17 g by mouth daily. 14 each 0  . topiramate (TOPAMAX) 50 MG tablet Take 50 mg by mouth 2 (two) times daily.    Marland Kitchen zolpidem (AMBIEN) 10 MG tablet Take 10 mg by mouth at bedtime as needed for sleep.     No  current facility-administered medications for this visit.     For VQI Use Only  PRE-ADM LIVING: Home  AMB STATUS: Ambulatory   Physical Examination  Vitals:   02/26/16 1301  BP: 109/70  Pulse: 94  Resp: 18  Temp: 97 F (36.1 C)  TempSrc: Oral  SpO2: 100%  Weight: (!) 315 lb (142.9 kg)  Height: 5\' 5"  (1.651 m)     Body mass index is 52.42 kg/m.  General: A&O x 3, WD, morbidly obese  Pulmonary: Sym exp, good air movt, CTAB, no rales, rhonchi, & wheezing  Cardiac: RRR, Nl S1, S2, no Murmurs, rubs or gallops  Vascular: Vessel Right Left  Radial Palpable Palpable  Brachial Palpable Palpable  Carotid Palpable, without bruit Palpable, without bruit  Aorta Not palpable due to pannus N/A  Femoral Faintly Palpable Faintly Palpable  Popliteal Not palpable Not palpable  PT Palpable Palpable  DP Palpable Palpable    Gastrointestinal: soft, NTND, -G/R, - HSM, - masses, - CVAT B, incision healed, some mid-line fascia laxity palpable  Musculoskeletal: M/S 5/5 throughout , Extremities without ischemic changes, L calf slightly bigger than R, minimal edema in both legs, lipedema B  Neurologic: Pain and light touch intact in extremities , Motor exam as listed above  CTV abd/pelvis (02/26/16)  VASCULAR  Study has technical limitations due to body habitus and artifact from the hardware at the lumbosacral junction. However, there is no  evidence to suggest a deep venous thrombosis in the abdomen, pelvis or visualized lower extremities. Stenosis in the left common iliac vein cannot be excluded but thought to be unlikely based on the lack of secondary findings in the visualized left external iliac vein and left common femoral vein.  Left iliac artery bypass graft is poorly characterized on this venous phase of imaging.  NON-VASCULAR  No acute abnormality abdomen or pelvis.  Extensive laxity along the anterior abdominal wall related to  prior surgery.  I reviewed the CTV and agree that the hardware limits the evaluation of the CIV.  I agree that both the EIV and CFV appear undistended, which would not be expected if there was functional stenosis or occlusion in the CIV.   Medical Decision Making  Jackquline BoschLaverne Mikaelian is a [age] y.o. (08/14/1970) female who presents s/p Repair of L CIV, ligation of distal L CIA, L CIA to CFA BPG, IVCF placement for L CFV DVT, IVC filter removal   Unfortunately, CTV was limited by hardware scatter.  I agree that I don't clearly see extensive retroperitoneal venous distension as would be expected if the CIV was occluded.  Best way to determine the status of L iliac venous system would be to do an ascending venogram with IVUS.    Obviously this invasive and involves some risks which the patient is not interested in currently.  I recommended the patient consider compressive therapy to the L leg.  Her lipedema however will likely make wearing such difficult.  A custom pair of stocking might be necessary.  Will have patient follow up in 6 months for routine surveillance, BLE ABI.  I doubt aortoiliac duplex will be able to image the CIA to CFA bypass.  Thank you for allowing us to participate in this patient's care.  Leonides SakeBrian Undra Harriman, MD, FACS [facility] [place] Office: [phone] Pager: [phone]

## 2016-02-26 ENCOUNTER — Encounter: Payer: Self-pay | Admitting: Vascular Surgery

## 2016-02-26 ENCOUNTER — Ambulatory Visit (INDEPENDENT_AMBULATORY_CARE_PROVIDER_SITE_OTHER): Payer: PRIVATE HEALTH INSURANCE | Admitting: Vascular Surgery

## 2016-02-26 ENCOUNTER — Ambulatory Visit
Admission: RE | Admit: 2016-02-26 | Discharge: 2016-02-26 | Disposition: A | Discharge status: Home / Self Care | Payer: PRIVATE HEALTH INSURANCE | Source: Ambulatory Visit | Attending: Vascular Surgery | Admitting: Vascular Surgery

## 2016-02-26 VITALS — BP 109/70 | HR 94 | Temp 97.0°F | Resp 18 | Ht 65.0 in | Wt 315.0 lb

## 2016-02-26 DIAGNOSIS — S35515D Injury of left iliac vein, subsequent encounter: Secondary | ICD-10-CM

## 2016-02-26 DIAGNOSIS — S35514D Injury of right iliac vein, subsequent encounter: Secondary | ICD-10-CM | POA: Diagnosis not present

## 2016-02-26 DIAGNOSIS — I824Z9 Acute embolism and thrombosis of unspecified deep veins of unspecified distal lower extremity: Secondary | ICD-10-CM

## 2016-02-26 MED ORDER — IOPAMIDOL (ISOVUE-370) INJECTION 76%
125.0000 mL | Freq: Once | INTRAVENOUS | Status: AC | PRN
  Administered 2016-02-26: 125 mL via INTRAVENOUS

## 2016-02-26 NOTE — Addendum Note (Signed)
Addended by: Burton ApleyPETTY, Franchesca Veneziano A on: 02/26/2016 03:59 PM   Modules accepted: Orders

## 2016-03-11 ENCOUNTER — Ambulatory Visit
Admit: 2016-03-11 | Discharge: 2016-03-11 | Payer: PRIVATE HEALTH INSURANCE | Attending: Physical Medicine & Rehabilitation

## 2016-03-11 ENCOUNTER — Ambulatory Visit: Attending: Physical Medicine & Rehabilitation

## 2016-03-11 MED ORDER — DULOXETINE 30 MG CAP, DELAYED RELEASE
30 mg | ORAL_CAPSULE | Freq: Every day | ORAL | 1 refills | Status: DC
Start: 2016-03-11 — End: 2016-05-17

## 2016-03-11 NOTE — Progress Notes (Signed)
[facility]  [address] [place] [address]  Phone: [phone]  Fax: [phone]        INITIAL CONSULTATION      HISTORY OF PRESENT ILLNESS:  Tiffany [name] is a 46 y.o. female whom is referred from Dr. Rudi Heap secondary to a hx of L5-S1 fusion performed in 03/2015 in [place], [place]. Symptoms prior to her surgery consisted of low back pain extending posteriorly into her LLE with paresthesias to the toes. Patient did not experienced relief after the surgery. She reports her symptoms increased after her srurgery and now she c/o low back pain enteding into her BLE (L>R). Her pain is not positional. She rates pain 8/10. She reports bladder incontinence since her surgery x 1 years. She states he experienced some mild incontinence before her surgery, however it has worsened. Note from Dr. Rudi Heap dated 02/19/16 indicating patient was seen with c/o back pain since 03/2015. Patient underwent L5-S1 laminectomy without relief. It was noted, her artery and vein were severed and surgery had to be prolonged and was hospitalized for 40 days due to surgical complication.  At that time she c/o paraesthesias on her BLE and UE as a result of her surgery.  Her pain is aggravated with any elongated position. At that time, she was treated with Neurontin, which she dc due to weigh gain. Now she takes Topamax 50 mg BID for 6 months with minimum relief. Regarding Neurontin, patient reports tolerated well and her weigh gained has not been stable since her dc Neurontin 300 mg BID. Required multiple follow surgeries including exploratory 03/27/15 to control bleeding the the left common iliac and femoral bypass. Multiple follow up surgeries for abdominal washouts. She subsequently developed a LLE DVT, and an IBC filter was placed. Subsequently, it had been removed. Per patient, she returned to the OR to complete her lumbar surgical fusion.  That operative note was not available for our reviewed. Patient has a hx of injections before the surgery. Patient reports a hx of therapy before and after her surgery. She continues with her HEP daily. Patient admits changes in bowel habits x 1 months. She denies possibility of pregnancy or breastfeeding. Patient denies fever, weight loss, or skin changes.Lumbar spine MRI dated 09/09/15 report reviewed. Films not available. 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 Good alignment at the fusion at L5-S1. Lumbar spine CT dated 09/09/15 reviewed. No films available. Per report, postsurgical changes at L5-S1 consisting of fusin with pedicle screws in place in good alignment. 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 BLE EMG dated 02/19/16 reviewed, Is within normal limits. No evidence of lumbosacral radiculopathy. The patient is RHD. PMP reviewed. Body mass index is 55.8 kg/(m^2).        Past Medical History:   Diagnosis Date   ??? Arthritis    ??? Vomiting     episodic          Past Surgical History:   Procedure Laterality Date   ??? HX ADENOIDECTOMY     ??? HX CHOLECYSTECTOMY  2013   ??? HX COLONOSCOPY      screening - neg results per patient   ??? HX ENDOSCOPY      screening - neg results per patient   ??? HX GYN      ectopic preg with tube rupture   ??? HX LUMBAR FUSION  2017    L5-S1   ???  HX ORTHOPAEDIC      left foot surgery - stress fx   ??? HX ORTHOPAEDIC      L &R carpal tunnel   ??? HX TONSILLECTOMY           Social History   Substance Use Topics   ??? Smoking status: Never Smoker   ??? Smokeless tobacco: Never Used   ??? Alcohol use No     Work status: The patient is working.  Marital status: The patient is married.     Current Outpatient Prescriptions   Medication Sig Dispense Refill   ??? cyclobenzaprine (FLEXERIL) 10 mg tablet Take  by mouth three (3) times daily as needed for Muscle Spasm(s).      ??? ferrous sulfate (IRON) 325 mg (65 mg iron) tablet Take  by mouth Daily (before breakfast).     ??? melatonin 2.5 mg chew Take  by mouth.     ??? meloxicam (MOBIC) 15 mg tablet Take 15 mg by mouth daily.     ??? topiramate (TOPAMAX) 50 mg tablet Take  by mouth two (2) times a day.     ??? oxybutynin chloride XL (DITROPAN XL) 10 mg CR tablet Take 10 mg by mouth daily.     ??? DULoxetine (CYMBALTA) 30 mg capsule Take 1 Cap by mouth daily. 30 Cap 1   ??? HYDROcodone-acetaminophen (NORCO) 5-325 mg per tablet Take 1 Tab by mouth every four (4) hours as needed for Pain. (Patient not taking: Reported on 03/11/2016) 12 Tab 0       Allergies   Allergen Reactions   ??? Darvocet A500 [Propoxyphene N-Acetaminophen] Hives          History reviewed. No pertinent family history.      REVIEW OF SYSTEMS  Constitutional symptoms: Negative  Eyes: Negative  Ears, Nose, Throat, and Mouth: Negative  Cardiovascular: Negative  Respiratory: Negative  Genitourinary: Negative  Integumentary (Skin and/or breast): Negative  Musculoskeletal: Positive for low back. LLE  Extremities: Negative for edema.  Endocrine/Rheumatologic: Negative  Hematologic/Lymphatic: Negative  Allergic/Immunologic: Negative  Psychiatric: Negative     Ambulates with a single point cane.  PHYSICAL EXAMINATION    Visit Vitals   ??? BP 125/79   ??? Pulse (!) 103   ??? Ht 5\' 3"  (1.6 m)   ??? Wt 315 lb (142.9 kg)   ??? BMI 55.8 kg/m2       CONSTITUTIONAL: NAD, A&O x 3  HEART: Regular rate and rhythm  ABDOMEN: Positive bowel sounds, soft, nontender, and nondistended  LUNGS: Clear to auscultation bilaterally.  SKIN: No rashes noted.   RANGE OF MOTION: The patient has full passive range of motion in all four extremities.  SENSATION: Decreased sensation to light touch of the 5 digit of the RUE. Decreased sensation to light touch of the LLE at S1. Sensation to light touch otherwise intact.  MOTOR:   Straight Leg Raise: Negative, bilateral  Hoffman: Negative, bilateral   Deep tendon reflexes are 0 at the brachioradialis, biceps, and triceps.  Deep tendon reflexes are 0 at the knees and ankles bilaterally.     Shoulder AB/Flex Elbow Flex Wrist Ext Elbow Ext Wrist Flex Hand Intrin Tone   Right +4/5 +4/5 +4/5 +4/5 +4/5 +4/5 +4/5   Left +4/5 +4/5 +4/5 +4/5 +4/5 +4/5 +4/5              Hip Flex Knee Ext Knee Flex Ankle DF GTE Ankle PF Tone   Right +4/5 +4/5 +4/5 +4/5 +4/5 +4/5 +4/5  Left +4/5 +4/5 +4/5 +4/5 +4/5 +4/5 +4/5         ASSESSMENT   Diagnoses and all orders for this visit:    1. Morbid obesity (Muddy)    2. Lumbar postlaminectomy syndrome    3. Lumbar neuritis    Other orders  -     DULoxetine (CYMBALTA) 30 mg capsule; Take 1 Cap by mouth daily.           IMPRESSIONS/RECOMMENDATIONS:  This is a patient with a history of  L5-S1 fusion performed in 03/2015. She presents with low back pain with radicular symptoms. I discussed multiple treatment options. Patient is not interested in further lumbar blocks. I will try her on Cymbalta 30 mg every day. The risks, benefits, and potential side effects of this medication were discussed. Patient understands and wishes to proceed. Patient advised to call the office if intolerant to new medication. She should continue with Topamax as prescribed by Dr. Rudi Heap. I encourage patient to perform her HEP daily. Patient advised to bring her MRI films on her f/u visit. I will see the patient back in 1 month's time or earlier if needed.        Written by Aundria Rud, ScribeKick, as dictated by Candi Leash, MD  I examined the patient, reviewed and agree with the note.

## 2016-04-08 ENCOUNTER — Encounter: Attending: Physical Medicine & Rehabilitation

## 2016-04-13 ENCOUNTER — Encounter: Attending: Physical Medicine & Rehabilitation

## 2016-05-02 DIAGNOSIS — Z0279 Encounter for issue of other medical certificate: Secondary | ICD-10-CM | POA: Diagnosis not present

## 2016-05-17 MED ORDER — DULOXETINE 30 MG CAP, DELAYED RELEASE
30 mg | ORAL_CAPSULE | ORAL | 0 refills | Status: DC
Start: 2016-05-17 — End: 2018-07-25

## 2016-05-17 NOTE — Telephone Encounter (Signed)
One month sent, needs fu with Dr. Vella Kohler

## 2016-05-17 NOTE — Telephone Encounter (Signed)
Patient is asking for a refill on Cymbalta.  Patient can be reached at [phone]

## 2016-05-17 NOTE — Telephone Encounter (Signed)
Made patient appt for May 2nd for Dr.Pezzella

## 2016-05-17 NOTE — Telephone Encounter (Signed)
This has been taken care of. Please see previous refill encounter.

## 2016-06-08 ENCOUNTER — Encounter: Attending: Physical Medicine & Rehabilitation

## 2016-08-17 NOTE — Progress Notes (Signed)
Received medical record request from patient, no records in [facility].  I faxed request to [facility] and sent to scan.

## 2016-08-26 ENCOUNTER — Encounter (HOSPITAL_COMMUNITY): Payer: PRIVATE HEALTH INSURANCE

## 2016-08-26 ENCOUNTER — Ambulatory Visit: Payer: PRIVATE HEALTH INSURANCE | Admitting: Vascular Surgery

## 2016-10-05 ENCOUNTER — Encounter: Payer: Self-pay | Admitting: Vascular Surgery

## 2016-10-11 NOTE — Progress Notes (Deleted)
Established Previous Bypass   History of Present Illness   Kristina Estes is a 46 y.o. (12-04-1970) female who presents with chief complaint: ***.     Prior procedures included: 1. IVC filter retrieval (07/23/15) 2. Xlap, repair L iliac vein, ligation of distal left CIA, L CIA to L CFA BPG, VAC (03/29/15) done for L CIV and CIA injury during spine case 3. Multiple abd washout and VAC (2/19, 2/21, 2/23, 2/24) 4. Abd closure with mesh by Dr. Derrell Lolling (04/08/15) 5. Evac hematoma by Dr. Derrell Lolling (04/13/15) 6. IVCF placement by Dr. Myra Gianotti (04/14/15) 7. Repeat evac hematoma by Dr. Derrell Lolling (04/22/15)  Her current sx are: ***.    The patient had her spine instrumentation with Dr. Conchita Paris on 04/22/15. She denies any intermittent claudication or rest pain.  The patient's PMH, PSH, SH, and FamHx are unchanged from 02/26/16.  Current Outpatient Prescriptions  Medication Sig Dispense Refill  . cyclobenzaprine (FLEXERIL) 10 MG tablet Take 15 mg by mouth 2 (two) times daily.     Marland Kitchen docusate sodium (COLACE) 100 MG capsule Take 1 capsule (100 mg total) by mouth 2 (two) times daily. 10 capsule 0  . IRON PO Take 1 tablet by mouth daily.    . meloxicam (MOBIC) 15 MG tablet Take 15 mg by mouth daily.    . Multiple Vitamin (MULTIVITAMIN WITH MINERALS) TABS tablet Take 1 tablet by mouth daily.    . polyethylene glycol (MIRALAX / GLYCOLAX) packet Take 17 g by mouth daily. 14 each 0  . topiramate (TOPAMAX) 50 MG tablet Take 50 mg by mouth 2 (two) times daily.    Marland Kitchen zolpidem (AMBIEN) 10 MG tablet Take 10 mg by mouth at bedtime as needed for sleep.     No current facility-administered medications for this visit.     On ROS today: ***, ***   Physical Examination  ***There were no vitals filed for this visit. ***There is no height or weight on file to calculate BMI.  General {LOC:19197::"Somulent","Alert"}, {Orientation:19197::"Confused","O x 3"}, {Weight:19197::"Obese","Cachectic","WD"}, {General state of  health:19197::"Ill appearing","Elderly","NAD"}  Pulmonary {Chest wall:19197::"Asx chest movement","Sym exp"}, {Air movt:19197::"Decreased *** air movt","good B air movt"}, {BS:19197::"rales on ***","rhonchi on ***","wheezing on ***","CTA B"}  Cardiac {Rhythm:19197::"Irregularly, irregular rate and rhythm","RRR, Nl S1, S2"}, {Murmur:19197::"Murmur present: ***","no Murmurs"}, {Rubs:19197::"Rub present: ***","No rubs"}, {Gallop:19197::"Gallop present: ***","No S3,S4"}  Vascular Vessel Right Left  Radial {Palpable:19197::"Not palpable","Faintly palpable","Palpable"} {Palpable:19197::"Not palpable","Faintly palpable","Palpable"}  Brachial {Palpable:19197::"Not palpable","Faintly palpable","Palpable"} {Palpable:19197::"Not palpable","Faintly palpable","Palpable"}  Carotid Palpable, {Bruit:19197::"Bruit present","No Bruit"} Palpable, {Bruit:19197::"Bruit present","No Bruit"}  Aorta Not palpable N/A  Femoral {Palpable:19197::"Not palpable","Faintly palpable","Palpable"} {Palpable:19197::"Not palpable","Faintly palpable","Palpable"}  Popliteal {Palpable:19197::"Prominently palpable","Not palpable"} {Palpable:19197::"Prominently palpable","Not palpable"}  PT {Palpable:19197::"Not palpable","Faintly palpable","Palpable"} {Palpable:19197::"Not palpable","Faintly palpable","Palpable"}  DP {Palpable:19197::"Not palpable","Faintly palpable","Palpable"} {Palpable:19197::"Not palpable","Faintly palpable","Palpable"}    Gastro- intestinal soft, {Distension:19197::"distended","non-distended"}, {TTP:19197::"TTP in *** quadrant","appropriate tenderness to palpation","non-tender to palpation"}, {Guarding:19197::"Guarding and rebound in *** quadrant","No guarding or rebound"}, {HSM:19197::"HSM present","no HSM"}, {Masses:19197::"Mass present: ***","no masses"}, {Flank:19197::"CVAT on ***","Flank bruit present on ***","no CVAT B"}, {AAA:19197::"Palpable prominent aortic pulse","No palpable prominent aortic pulse"}, {Surgical  incision:19197::"Surg incisions: ***","Surgical incisions well healed"," "}  Musculo- skeletal M/S 5/5 throughout {MS:19197::"except ***"," "}, Extremities without ischemic changes {MS:19197::"except ***"," "}, {Edema:19197::"Pitting edema present: ***","Non-pitting edema present: ***","No edema present"}, {Varicosities:19197::"Varicosities present: ***","No visible varicosities "}, {LDS:19197::"Lipodermatosclerosis present: ***","No Lipodermatosclerosis present"}  Neurologic Pain and light touch intact in extremities{CN:19197::" except for decreased sensation in ***"," "}, Motor exam as listed above    Non-Invasive Vascular Imaging ABI (***)  R:   ABI: *** (***),   PT: {  Signals:19197::"none","mono","bi","tri"}  DP: {Signals:19197::"none","mono","bi","tri"}  TBI:  ***  L:   ABI: *** (***),   PT: {Signals:19197::"none","mono","bi","tri"}  DP: {Signals:19197::"none","mono","bi","tri"}  TBI: ***  Aortoiliac Duplex (***)  Ao: *** c/s  R iliac: *** c/s  L iliac: *** c/s  Bypass Duplex (***)  Proximal to bypass: *** c/s  With-in bypass: *** c/s  Distal to bypass: *** c/s   Medical Decision Making   Kristina Estes is a 46 y.o. female who presents with: s/p Repair of L CIV, ligation of distal L CIA, L CIA to CFA BPG, IVCF placement for L CFV DVT, IVC filter removal   Based on the patient's vascular studies and examination, I have offered the patient: q6 mon BLE ABI.  I discussed in depth with the patient the nature of atherosclerosis, and emphasized the importance of maximal medical management including strict control of blood pressure, blood glucose, and lipid levels, obtaining regular exercise, and cessation of smoking.    The patient is aware that without maximal medical management the underlying atherosclerotic disease process will progress, limiting the benefit of any interventions.  I discussed in depth with the patient the need for long term surveillance to  improve the primary assisted patency of his bypass.  The patient agrees to cooperate with such. The patient is currently not on on statin as not medically indicated.  The patient is currently not on an anti-platelet. Patient will be started on ASA 81 mg PO daily.  She was previous discontinued on anticoagulation and antiplatelet due to bleeding complicating her hernia repair.  Thank you for allowing us to participate in this patient's care.   Leonides SakeBrian Chen, MD, FACS [facility] Office: [phone] Pager: [phone]

## 2016-10-14 ENCOUNTER — Encounter (HOSPITAL_COMMUNITY): Payer: PRIVATE HEALTH INSURANCE

## 2016-10-14 ENCOUNTER — Ambulatory Visit: Payer: PRIVATE HEALTH INSURANCE | Admitting: Vascular Surgery

## 2017-02-06 IMAGING — CR DG CHEST 1V PORT
1 series · 1 of 1 positions shown · non-contrast
Comparison: 03/28/2015 and earlier

CLINICAL DATA: 44-year-old female with ventilator dependent
respiratory failure. Hemorrhagic shock. Initial encounter.

EXAM:
PORTABLE CHEST 1 VIEW

[AP]
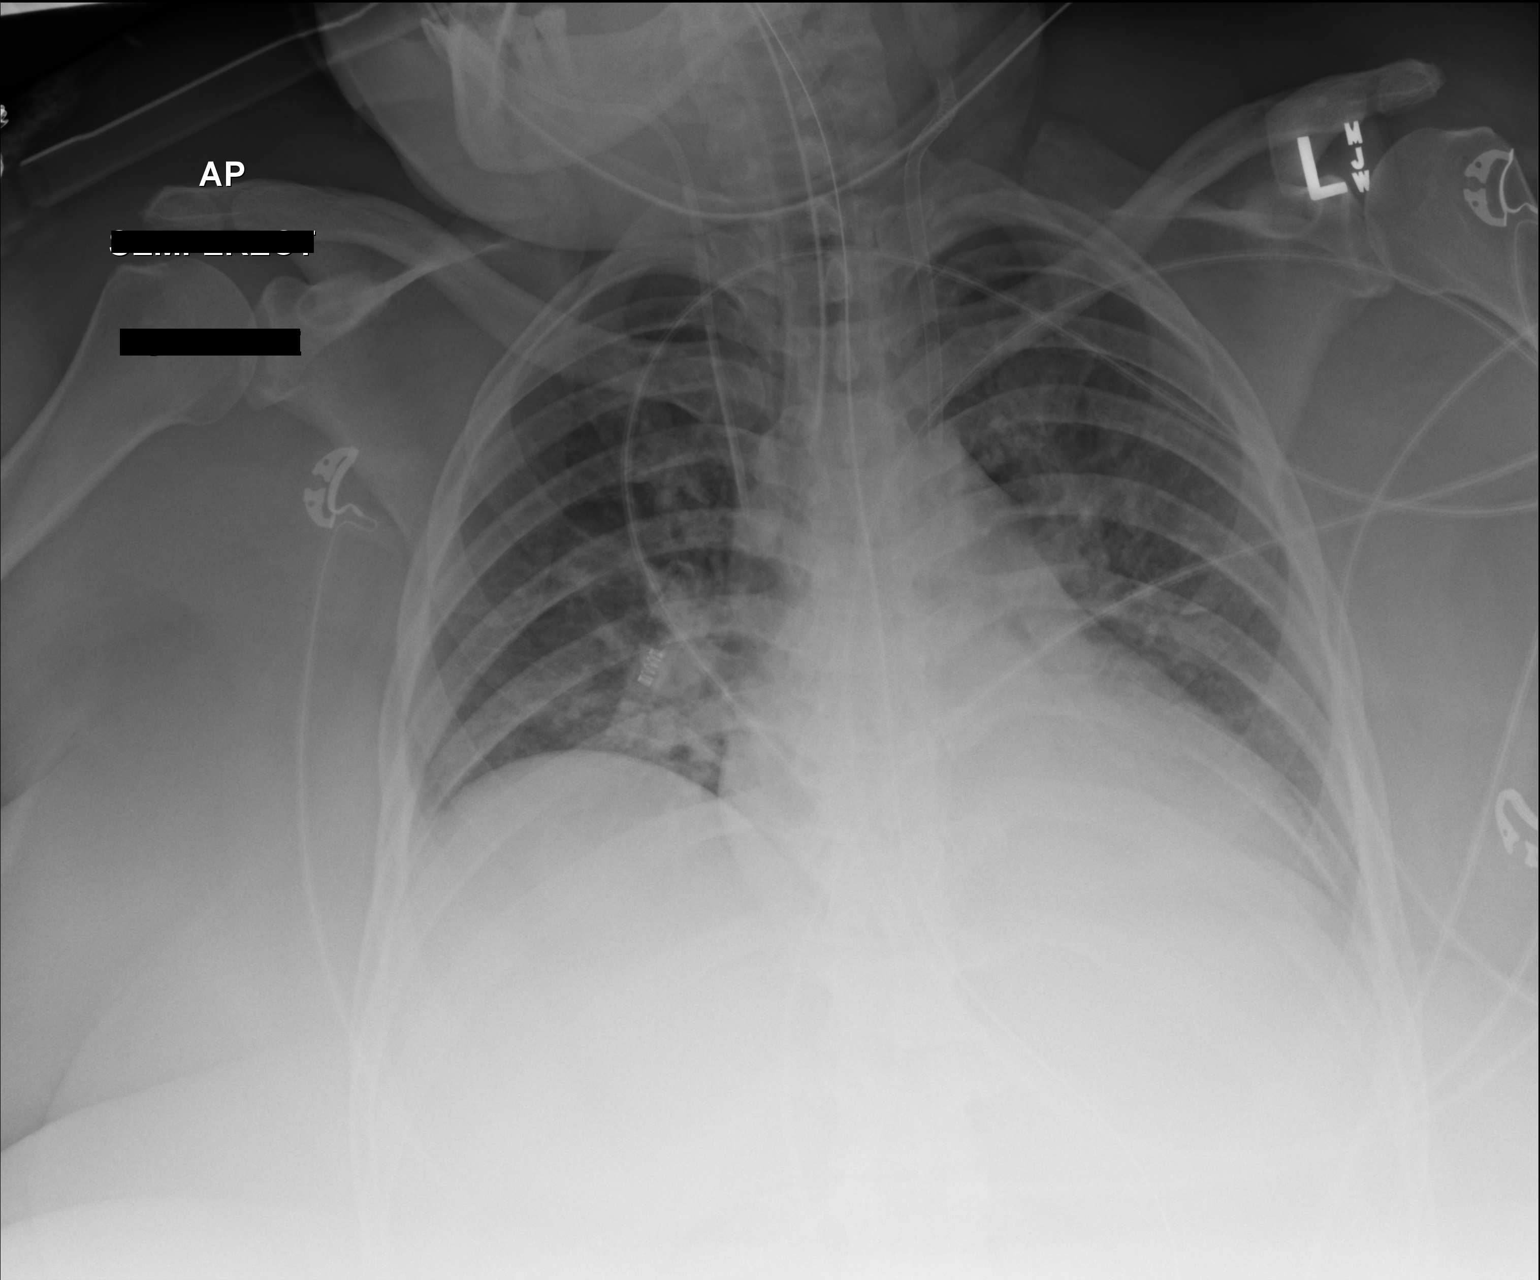

[1 of 1 positions shown; findings below may reference images not displayed]

FINDINGS: Portable AP semi upright view at 0272 hours. Stable endotracheal
tube tip just below the clavicles. Stable right IJ central line.
Enteric tube courses to the abdomen, tip not included. Stable left
IJ central line.

Mildly improved lung volumes. Mediastinal contours remain normal. No
pneumothorax or pulmonary edema. No pleural effusion identified.
Left greater than right lung base atelectasis or consolidation has
mildly regressed since 03/28/2015. No areas of worsening
ventilation.
IMPRESSION: 1.  Stable lines and tubes.
2. Mildly improved ventilation since 03/28/2015 with regressed left
greater than right bibasilar atelectasis/consolidation.

## 2017-02-18 IMAGING — CT CT ABD-PELV W/ CM
1 of 5 series · 2 of 46 positions shown, 3 images · IV contrast (Iodine)
Comparison: CT 03/27/2015

CLINICAL DATA: Abdominal swelling. Abdominal mesh placed following
the anterior repair of the iliac artery injury.

EXAM:
CT ABDOMEN AND PELVIS WITH CONTRAST
TECHNIQUE: Multidetector CT imaging of the abdomen and pelvis was performed
using the standard protocol following bolus administration of
intravenous contrast.
CONTRAST:  100 cc Omnipaque

[Series 204: coronal · coronal · 0.45mm/px · 2 of 183 slices shown, 3 images]
[im 61/183  soft-tissue]
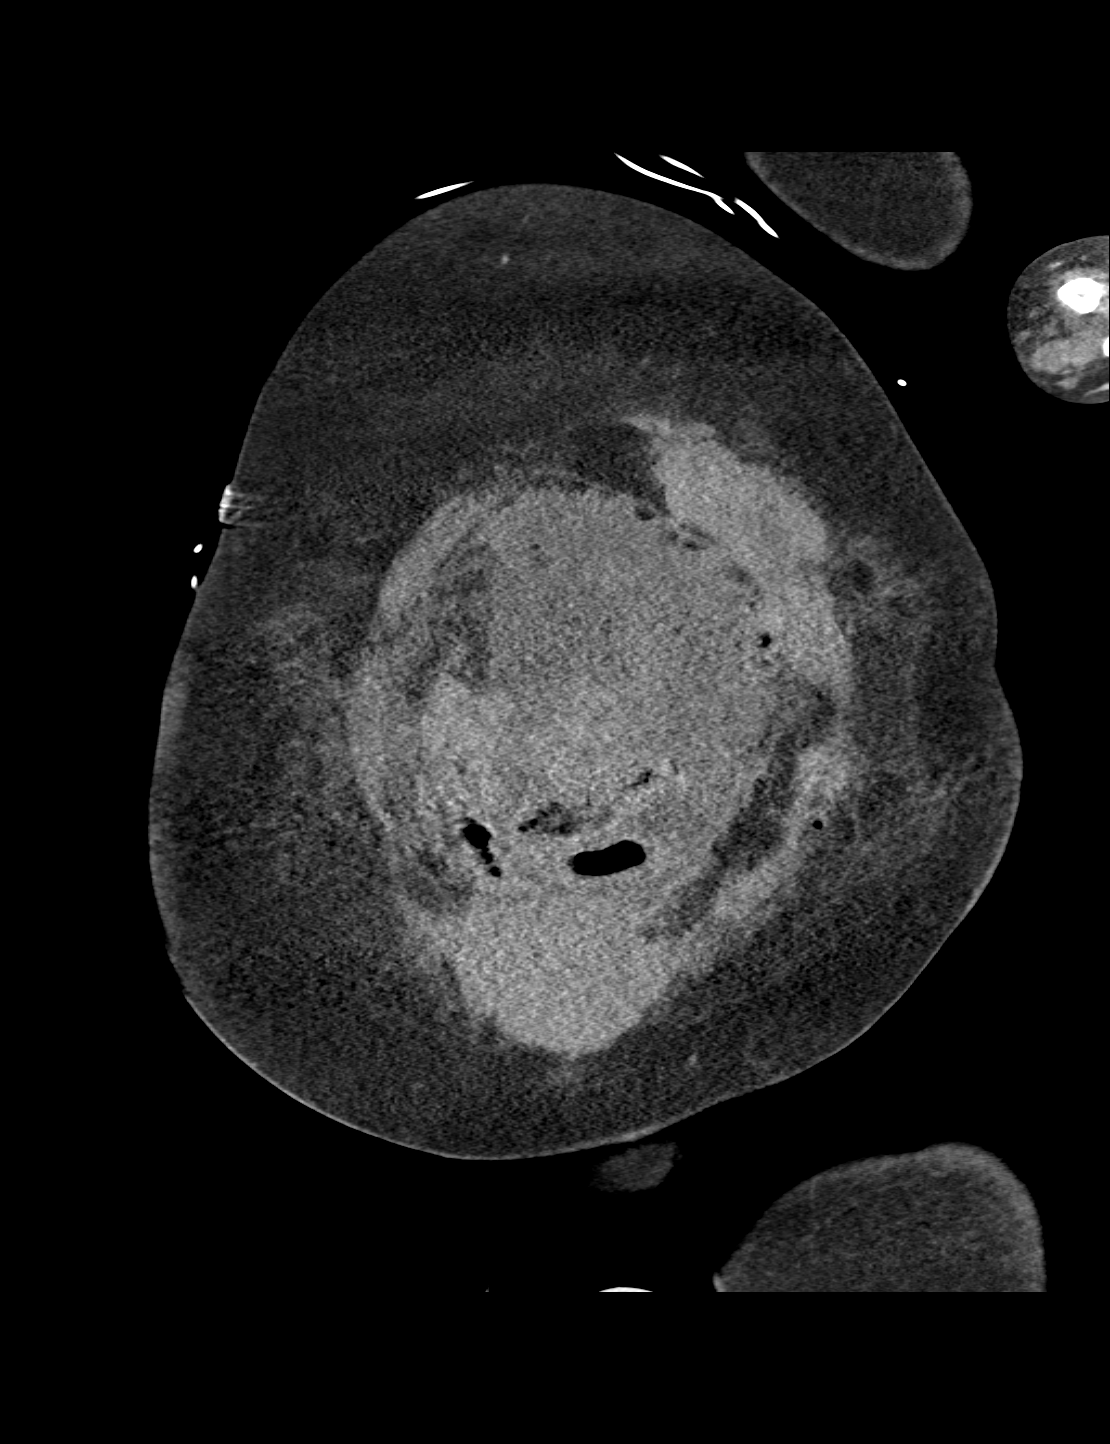
[im 61/183  bone]
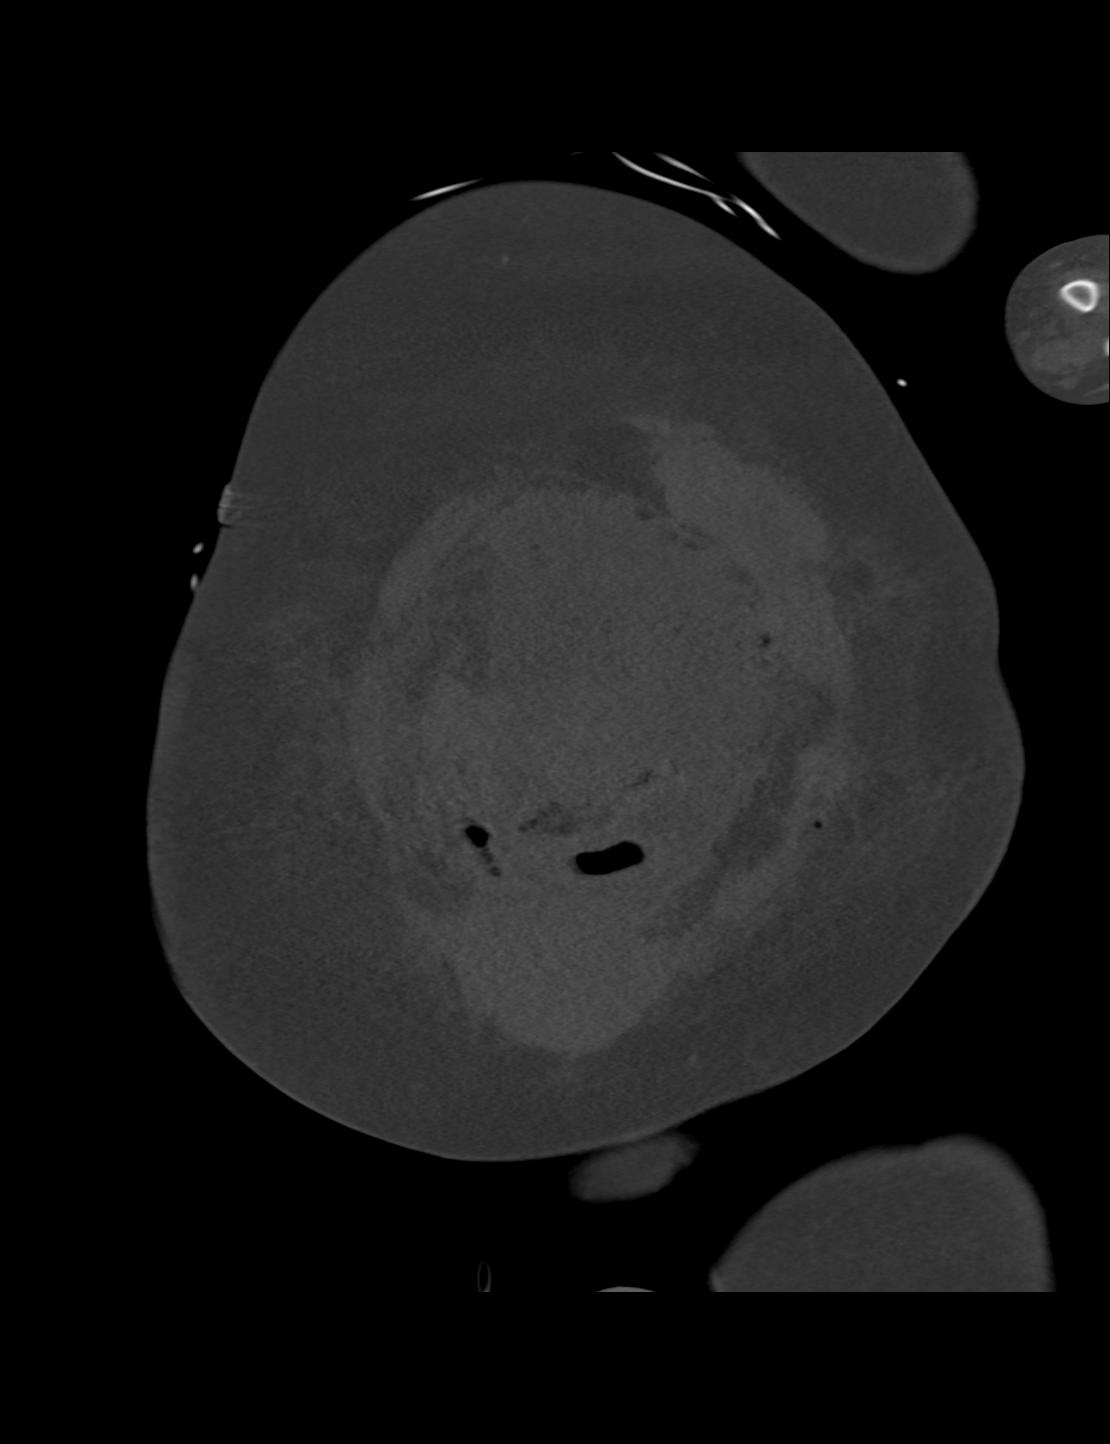
[im 142/183  soft-tissue]
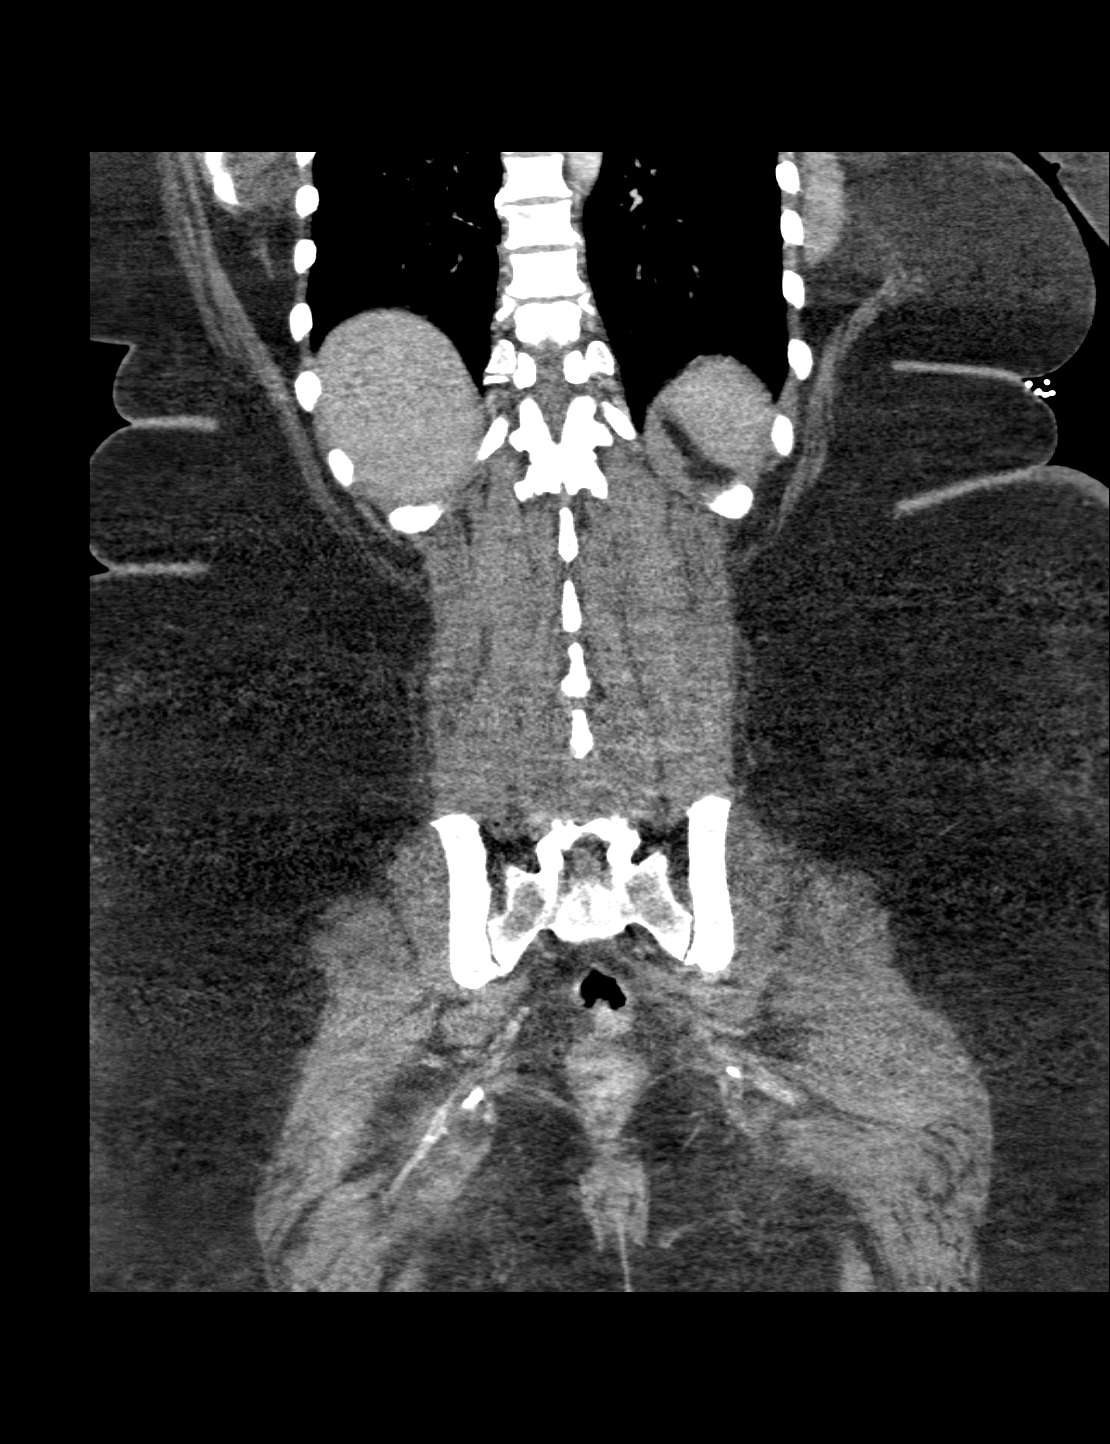

[2 of 46 positions shown; findings below may reference images not displayed]

FINDINGS: Lower chest: Lung bases are clear.

Hepatobiliary: No focal hepatic lesion. Postcholecystectomy. No
biliary dilatation.

Pancreas: Pancreas is normal. No ductal dilatation. No pancreatic
inflammation.

Spleen: Normal spleen

Adrenals/urinary tract: Adrenal glands and kidneys are normal. The
ureters and bladder normal.

Stomach/Bowel: NG tube in the stomach. Loops of small bowel and
colon are decompressed. The small bowel appears to remain within the
peritoneal space without clear evidence of herniation into the
ventral abdominal complex fluid collection. Along the most inferior
margin of the fluid collection difficult to completely exclude bowel
entering the fluid collection. Colon appears normal. No
intraperitoneal free air.

Vascular/Lymphatic: Abdominal aorta is normal caliber. The iliac
arteries perfuse normally. Interval reduction in volume of hematoma
along the LEFT iliac vessels measuring 5.3 by 5.4 cm in axial
dimension compared to 9.9 x 7.4 cm.

There is poor opacification of the LEFT common femoral vein and LEFT
external and common iliac vein (images 91 through 75 of series 201).

Reproductive: Uterus and ovaries are normal.

Other: Large ventral complex fluid collection is new from prior.
Collection measures 17 cm x 11 cm by 32 cm for a calculated volume
estimation of 2777 cubic cm. There surgical drain in the most
ventral (superficial) aspect of the fluid collection. There are a
complex loculations within the collection. There is a hematocrit
effect superiorly within the collection with a fluid fluid level on
image 27, series 201. Scattered gas within the collection is felt to
relate to infection not bowel perforation. The surgical mesh is not
identified.

Musculoskeletal: No aggressive osseous lesion.
IMPRESSION: 1. Large complex ventral fluid collection with interspersed gas is
is favored to represent a large postsurgical abscess / hematoma with
complex internal loculations. Volume estimation of 2777 cubic cm.
2. Small bowel bowel approximates the ventral peritoneal surface and
may enter the most caudal aspect of the fluid collection but is not
felt to occupy a significant portion of the large fluid collection.
3. Surgical drains within the ventral portion of the fluid
collection.
4. Interval reduction in LEFT retroperitoneal pelvic hematoma.
5. No evidence of bowel obstruction.
6. Deep venous thrombosis within the LEFT common femoral vein.
Findings conveyed Ceola, Felner 04/13/2015  at[DATE].

## 2017-02-18 IMAGING — CR DG CHEST 1V PORT
1 series · 1 of 1 positions shown · non-contrast
Comparison: 04/10/2015

CLINICAL DATA: Intubation

EXAM:
PORTABLE CHEST 1 VIEW

[AP]
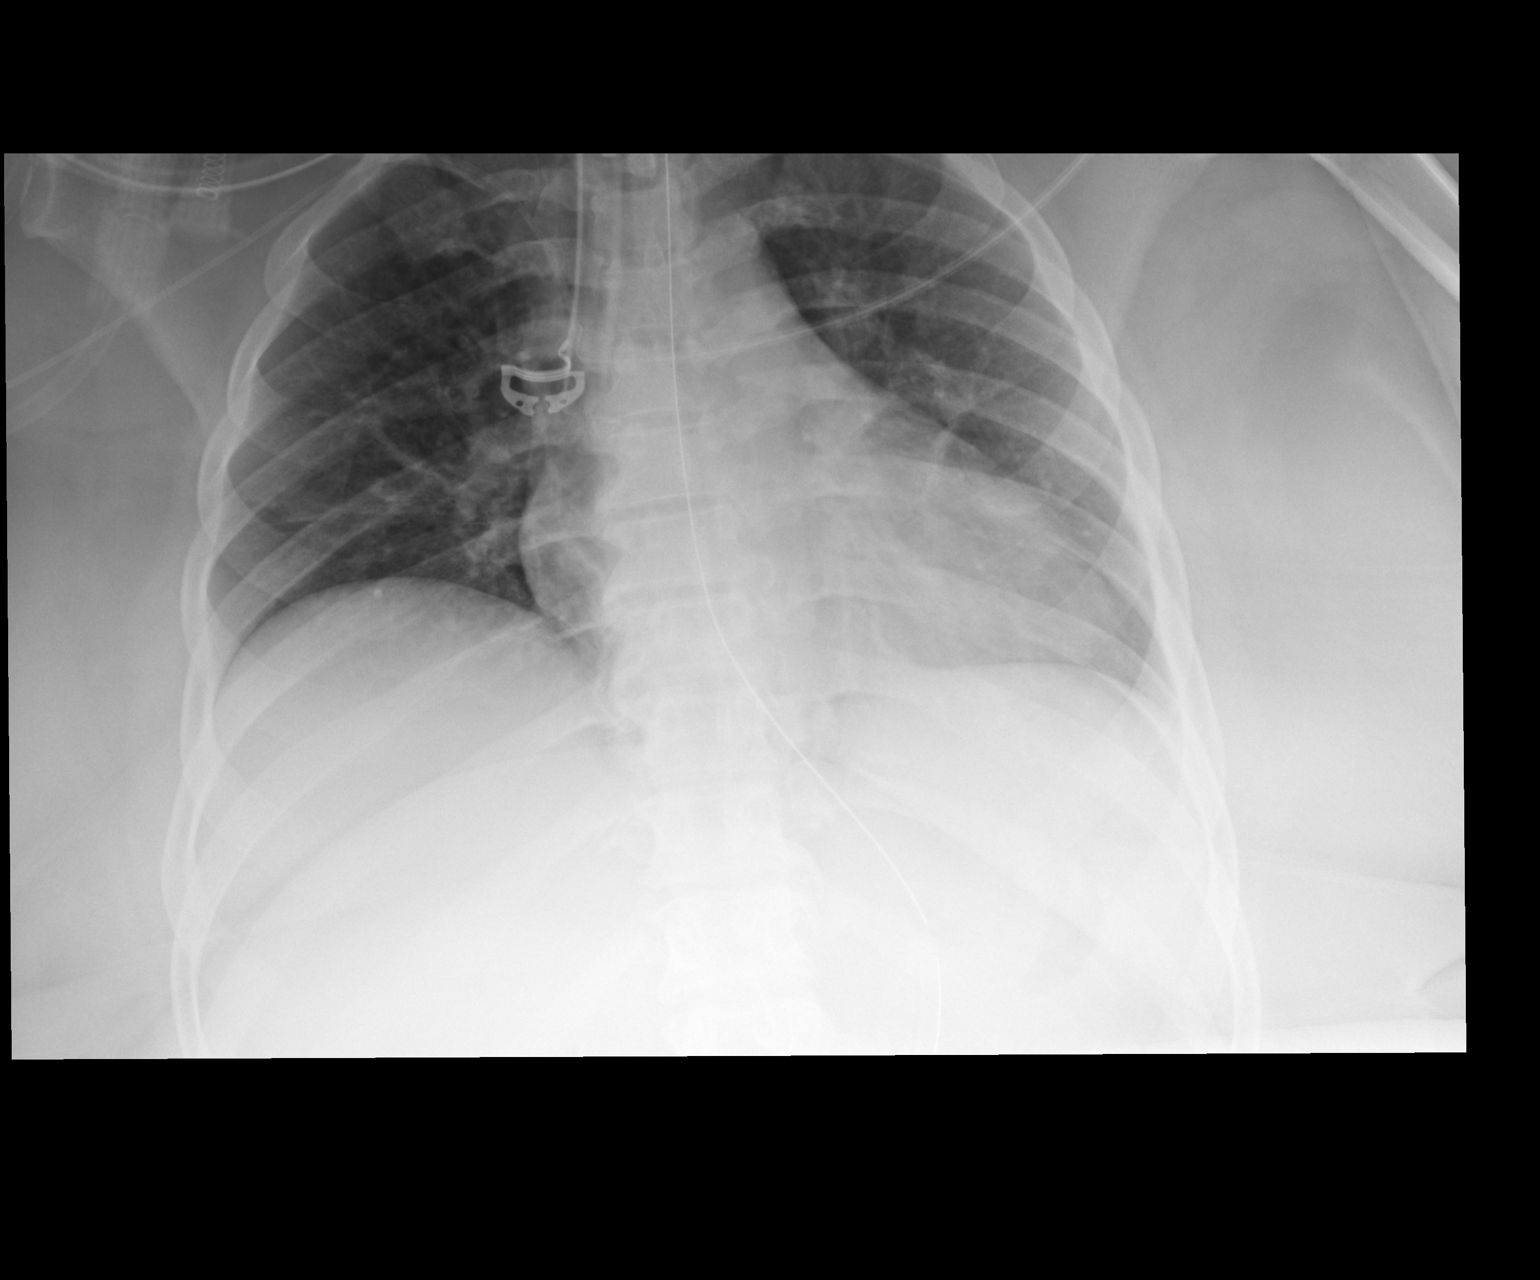

[1 of 1 positions shown; findings below may reference images not displayed]

FINDINGS: Stable right PICC line and nasogastric tube. Endotracheal tube has
been placed and extends about 1.9 cm beyond the carina into the
right main bronchus. Heart size upper normal and stable. Lungs
clear.
IMPRESSION: Suggests withdrawing the endotracheal tube by 3-4 cm and repeating
the image. Critical Value/emergent results were called by telephone
at the time of interpretation on 04/13/2015 at [DATE] to the
patient's nurse, Maniega, who verbally acknowledged these results.

## 2017-02-18 IMAGING — CR DG CHEST 1V PORT
1 series · 1 of 1 positions shown · non-contrast
Comparison: Earlier the same date and 04/10/2015.

CLINICAL DATA: Endotracheal tube repositioning.

EXAM:
PORTABLE CHEST 1 VIEW

[AP]
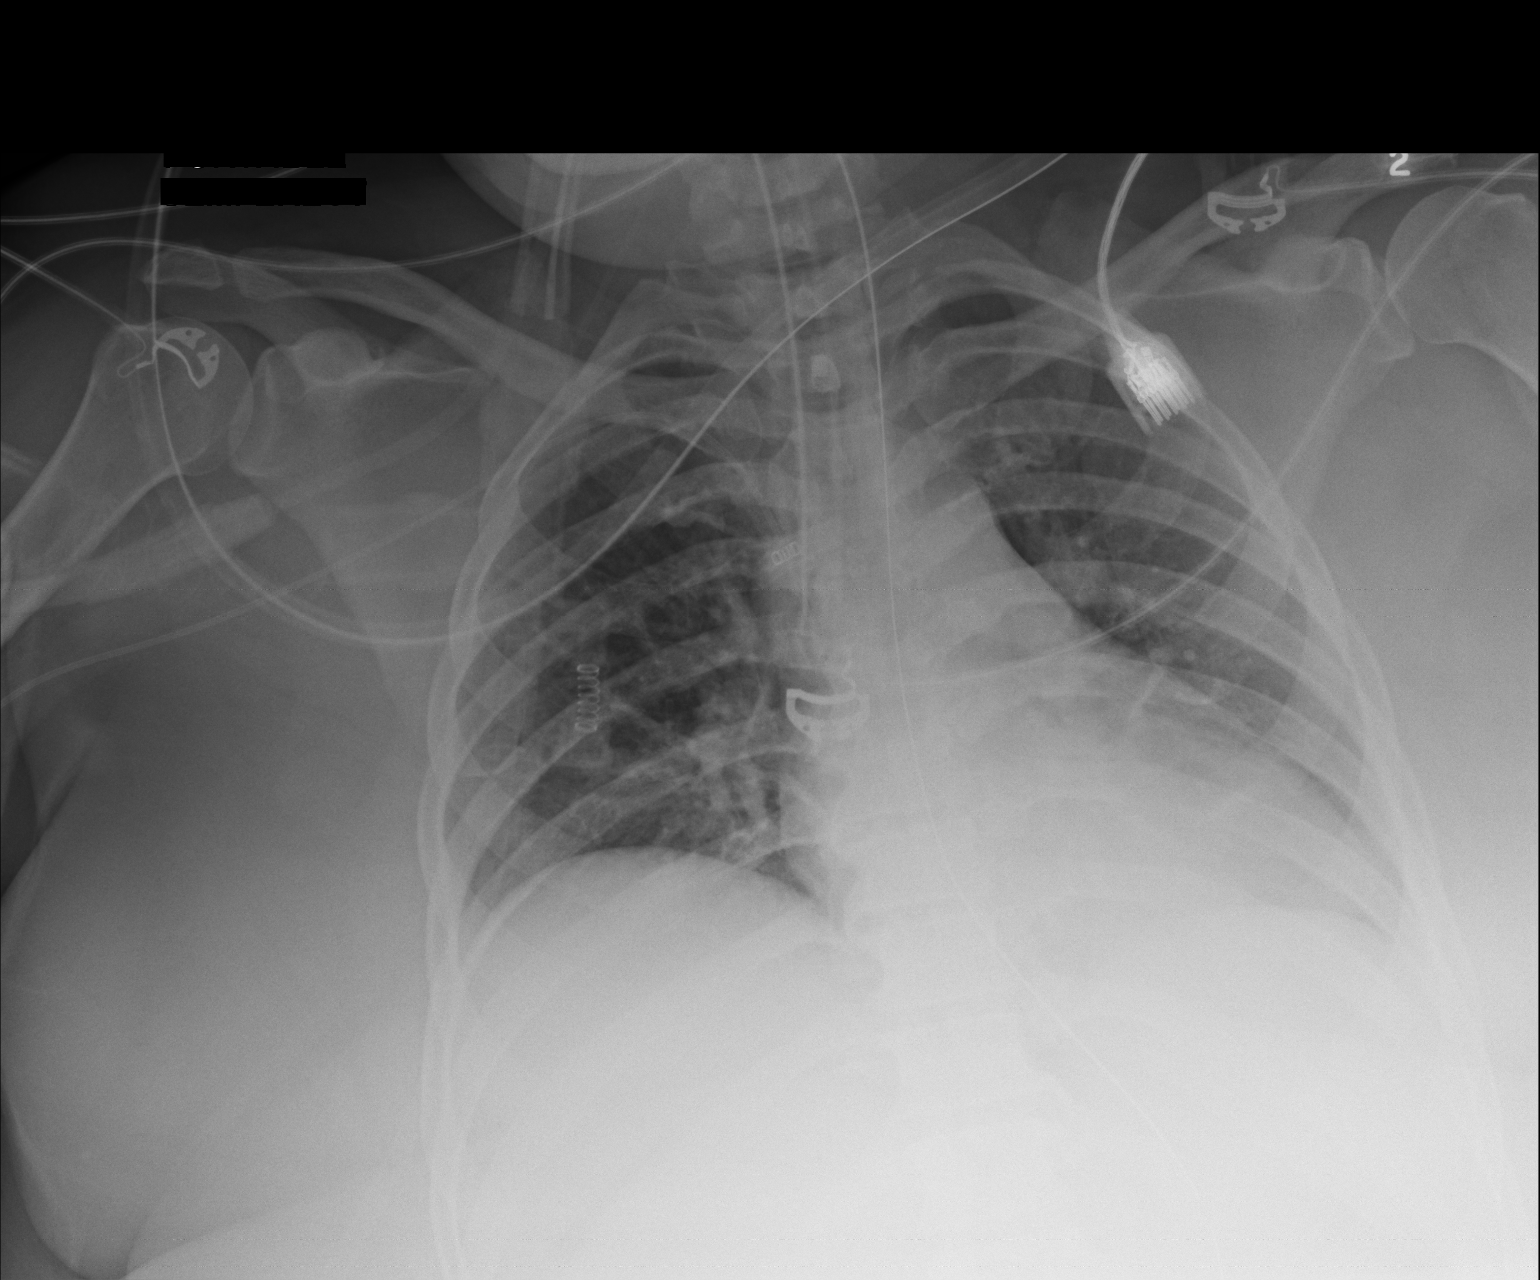

[1 of 1 positions shown; findings below may reference images not displayed]

FINDINGS: 8754 hours. The endotracheal tube has been partially withdrawn,
although is still at the level of the carina, directed towards the
right mainstem bronchus. Right arm PICC extends to the lower SVC
level. The nasogastric tube appears unchanged. There is increased
atelectasis at the left lung base. The heart size and mediastinal
contours are stable. There is no pneumothorax or significant pleural
effusion.
IMPRESSION: Although improved, the endotracheal tube tip remains low, at the
level of the carina. For more optimal positioning, withdrawal of the
tube by approximately 4-5 cm recommended. Critical Value/emergent
results were called by telephone at the time of interpretation on
04/13/2015 at [DATE] to the patient's nurse, Mention, who verbally
acknowledged these results.

## 2017-05-09 ENCOUNTER — Ambulatory Visit: Admit: 2017-05-09 | Discharge: 2017-05-09 | Payer: PRIVATE HEALTH INSURANCE | Attending: Family

## 2017-05-09 NOTE — Progress Notes (Signed)
A user error has taken place: encounter opened in error, closed for administrative reasons.

## 2017-05-15 ENCOUNTER — Encounter: Attending: Surgery

## 2017-05-18 ENCOUNTER — Ambulatory Visit: Admit: 2017-05-18 | Discharge: 2017-05-18 | Payer: PRIVATE HEALTH INSURANCE | Attending: Surgery

## 2017-05-18 ENCOUNTER — Ambulatory Visit: Attending: Surgery

## 2017-05-18 NOTE — Progress Notes (Signed)
Pt ID confirmed    Weight Loss Metrics 05/18/2017 03/11/2016 12/20/2011 09/23/2011   Today's Wt 343 lb 315 lb 297 lb 291 lb   BMI 57.08 kg/m2 55.8 kg/m2 49.42 kg/m2 48.43 kg/m2       Body mass index is 57.08 kg/m??.    Chief Complaint   Patient presents with   ??? Advice Only     online seminar confirmed

## 2017-05-18 NOTE — Progress Notes (Signed)
Initial Consultation for Bariatric Surgery Template     Tiffany [name] is a 47 y.o. female who comes into the office today for initial consultation for the surgical options for the treatment of morbid obesity. The patient initially identified obesity at the age of 75 and at age 52 weighed 240 lbs. She has tried a variety of unsupervised weight-loss attempts including self imposed, but has yet to meet with lasting success.  Maximum weight lost on a diet is about 50 lbs, but that the weight loss always seems to return. She tends to eat fried foods and snacks late at night. She drinks sodas and fruit juice. She has gained the bulk of her weight after a vascular injury during an anterior lumbar surgery, resulting in open abdomen and prolonged hospital stay. She had a left leg DVT with ilter placement and removal.     Today, the patient is  Height: 5\' 5"  (165.1 cm) tall, Weight: 155.6 kg (343 lb) lbs for a Body mass index is 57.08 kg/m??.  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    Past Medical History:   Diagnosis Date   ??? Arthritis    ??? Vomiting     episodic       Past Surgical History:   Procedure Laterality Date   ??? ABDOMEN SURGERY PROC UNLISTED  2017    Open abdomen management (14 days) after vascular injury during lumbar fusion   ??? HX ADENOIDECTOMY     ??? HX CHOLECYSTECTOMY  2013   ??? HX COLONOSCOPY      screening - neg results per patient   ??? HX ENDOSCOPY      screening - neg results per patient   ??? HX GYN      ectopic preg with tube rupture   ??? HX LUMBAR FUSION  6644    I3-K7 - complicated by vascular injury   ??? HX ORTHOPAEDIC      left foot surgery - stress fx   ??? HX ORTHOPAEDIC      L &R carpal tunnel   ??? HX TONSILLECTOMY     ??? VASCULAR SURGERY PROCEDURE UNLIST      vascular repair of aortic/venocaval repair.        Current Outpatient Medications    Medication Sig Dispense Refill   ??? DULoxetine (CYMBALTA) 30 mg capsule TAKE 1 CAPSULE BY MOUTH DAILY 30 Cap 0   ??? cyclobenzaprine (FLEXERIL) 10 mg tablet Take  by mouth three (3) times daily as needed for Muscle Spasm(s).     ??? ferrous sulfate (IRON) 325 mg (65 mg iron) tablet Take  by mouth Daily (before breakfast).     ??? melatonin 2.5 mg chew Take  by mouth.     ??? meloxicam (MOBIC) 15 mg tablet Take 15 mg by mouth daily.     ??? topiramate (TOPAMAX) 50 mg tablet Take  by mouth two (2) times a day.     ??? oxybutynin chloride XL (DITROPAN XL) 10 mg CR tablet Take 10 mg by mouth daily.     ??? HYDROcodone-acetaminophen (NORCO) 5-325 mg per tablet Take 1 Tab by mouth every four (4) hours as needed for Pain. 12 Tab 0       Allergies   Allergen Reactions   ??? Darvocet A500 [Propoxyphene N-Acetaminophen] Hives       Social History  Tobacco Use   ??? Smoking status: Never Smoker   ??? Smokeless tobacco: Never Used   Substance Use Topics   ??? Alcohol use: No   ??? Drug use: No       Family History   Problem Relation Age of Onset   ??? Hypertension Mother    ??? Heart Disease Father        Family Status   Relation Name Status   ??? Mother  Alive   ??? Father  Alive       Review of Systems:  Positive in BOLD    CONST: Fever, weight loss, fatigue or chills  GI: Nausea, vomiting, abdominal pain, change in bowel habits, hematochezia, melena, and GERD   INTEG: Dermatitis, abnormal moles  HEENT: Recent changes in vision, vertigo, epistaxis, dysphagia and hoarseness  CV: Chest pain, palpitations, HTN, edema and varicosities  RESP: Cough, shortness of breath, wheezing, hemoptysis, snoring and reactive airway disease  GU: Hematuria, dysuria, frequency, urgency, nocturia and stress urinary incontinence   MS: Weakness, joint pain and arthritis  ENDO: Diabetes, thyroid disease, polyuria, polydipsia, polyphagia, poor wound healing, heat intolerance, cold intolerance  LYMPH/HEME: Anemia, bruising and history of blood transfusions   NEURO: Dizziness, headache, fainting, seizures and stroke  PSYCH: Anxiety and depression      Physical Exam    Visit Vitals  BP 115/70   Pulse 81   Temp 97.8 ??F (36.6 ??C) (Oral)   Resp 18   Ht 5\' 5"  (1.651 m)   Wt 155.6 kg (343 lb)   SpO2 99%   BMI 57.08 kg/m??       Pre op weight: 343  EBW: 209  Wt loss to date: 0       General: 47 y.o.) female in no acute distress. Morbidly obese in abdomen and hips - mixed pattern  HEENT: Normocephalic, atraumatic, Pupils equal and reactive, nasopharynx clear, oropharynx clear and moist without lesions  NECK: Supple, no lymphadenopathy, thyromegaly, carotid bruits or jugular venous distension. trachea midline  RESP: Clear to auscultation bilaterally, no wheezes, rhonchi, or rales, normal respiratory excursion  CV: Regular rate and rhythm, no murmurs, rubs or gallops. 3+/4 pulses in bilateral dorsalis pedis and posterior tibialis. No distal edema or varicosities.  ABD: Soft, nontender, very distended, normoactive bowel sounds, no hepatosplenomegaly, easily palpable costal margins, mixed distribution, long upper midline incision from xiphoid to 6 cm above pubis, very large midline incisional hernia with loss of domain.   Extremities: Warm, well perfused, no tenderness or swelling, normal gait/station  Neuro: Sensation and strength grossly intact and symmetrical  Psych: Alert and oriented to person, place, and time.    Impression:    Tiffany [name] is a 47 y.o. female who is suffering from morbid obesity with a BMI of 57  who could benefit from bariatric surgery.  We have had an extensive discussion with regard to the risks, benefits and likely outcomes of the operation.     With her prior surgery, I am not sure that she is even a surgical candidate. If so, it will almost certainly require open surgery which is very high risk with her loss of domain. I need to review her operative records to see exactly what was done and how. However, based on her  incision, she may have had a supraceliac clamp applied at some point and if so, would make sleeve almost untenable to complete as well    We will talk after I have read her records and discuss  if she is a candidate with me in this program

## 2017-06-08 NOTE — Telephone Encounter (Signed)
LVM for patient to call me... Per Dr. Andree Elk she needs to see Tiffany [name] her records will be left at front desk at [facility] for her to pick up.

## 2017-06-20 ENCOUNTER — Inpatient Hospital Stay: Payer: MEDICAID

## 2017-06-20 LAB — HCG URINE, QL. - POC: Pregnancy test,urine (POC): NEGATIVE

## 2017-06-20 MED ORDER — SODIUM CHLORIDE 0.9 % IJ SYRG
Freq: Three times a day (TID) | INTRAMUSCULAR | Status: DC
Start: 2017-06-20 — End: 2017-06-20

## 2017-06-20 MED ORDER — PROPOFOL 10 MG/ML IV EMUL
10 mg/mL | INTRAVENOUS | Status: DC | PRN
Start: 2017-06-20 — End: 2017-06-20
  Administered 2017-06-20 (×2): via INTRAVENOUS

## 2017-06-20 MED ORDER — LACTATED RINGERS IV
INTRAVENOUS | Status: DC
Start: 2017-06-20 — End: 2017-06-20
  Administered 2017-06-20 (×2): via INTRAVENOUS

## 2017-06-20 MED ORDER — PROPOFOL 10 MG/ML IV EMUL
10 mg/mL | INTRAVENOUS | Status: AC
Start: 2017-06-20 — End: ?

## 2017-06-20 MED ORDER — LIDOCAINE (PF) 20 MG/ML (2 %) IJ SOLN
20 mg/mL (2 %) | INTRAMUSCULAR | Status: DC | PRN
Start: 2017-06-20 — End: 2017-06-20
  Administered 2017-06-20: 12:00:00 via INTRAVENOUS

## 2017-06-20 MED ORDER — SODIUM CHLORIDE 0.9 % INJECTION
20 mg/2 mL | Freq: Once | INTRAMUSCULAR | Status: AC
Start: 2017-06-20 — End: 2017-06-20
  Administered 2017-06-20: 12:00:00 via INTRAVENOUS

## 2017-06-20 MED ORDER — LIDOCAINE (PF) 20 MG/ML (2 %) IJ SOLN
20 mg/mL (2 %) | INTRAMUSCULAR | Status: AC
Start: 2017-06-20 — End: ?

## 2017-06-20 MED ORDER — SODIUM CHLORIDE 0.9 % IJ SYRG
INTRAMUSCULAR | Status: DC | PRN
Start: 2017-06-20 — End: 2017-06-20

## 2017-06-20 MED ORDER — KETOROLAC TROMETHAMINE 15 MG/ML INJECTION
15 mg/mL | INTRAMUSCULAR | Status: DC
Start: 2017-06-20 — End: 2017-06-20

## 2017-06-20 MED FILL — DIPRIVAN 10 MG/ML INTRAVENOUS EMULSION: 10 mg/mL | INTRAVENOUS | Qty: 20

## 2017-06-20 MED FILL — FAMOTIDINE (PF) 20 MG/2 ML IV: 20 mg/2 mL | INTRAVENOUS | Qty: 2

## 2017-06-20 MED FILL — BD POSIFLUSH NORMAL SALINE 0.9 % INJECTION SYRINGE: INTRAMUSCULAR | Qty: 40

## 2017-06-20 MED FILL — LACTATED RINGERS IV: INTRAVENOUS | Qty: 1000

## 2017-06-20 MED FILL — KETOROLAC TROMETHAMINE 15 MG/ML INJECTION: 15 mg/mL | INTRAMUSCULAR | Qty: 1

## 2017-06-20 MED FILL — XYLOCAINE-MPF 20 MG/ML (2 %) INJECTION SOLUTION: 20 mg/mL (2 %) | INTRAMUSCULAR | Qty: 5

## 2017-06-20 NOTE — Anesthesia Pre-Procedure Evaluation (Addendum)
Relevant Problems   No relevant active problems       Anesthetic History     PONV          Review of Systems / Medical History  Patient summary reviewed and pertinent labs reviewed    Pulmonary  Within defined limits                 Neuro/Psych   Within defined limits           Cardiovascular  Within defined limits                Exercise tolerance: >4 METS     GI/Hepatic/Renal     GERD: poorly controlled           Endo/Other        Morbid obesity and arthritis     Other Findings            Physical Exam    Airway  Mallampati: III  TM Distance: 4 - 6 cm  Neck ROM: normal range of motion   Mouth opening: Normal     Cardiovascular    Rhythm: regular  Rate: normal         Dental    Dentition: Lower partial plate and Upper partial plate     Pulmonary  Breath sounds clear to auscultation               Abdominal  GI exam deferred       Other Findings            Anesthetic Plan    ASA: 3  Anesthesia type: MAC          Induction: Intravenous  Anesthetic plan and risks discussed with: Patient

## 2017-06-20 NOTE — H&P (Signed)
H&P reviewed   Patient examined   No changes or interval changes noted     Dan Jazzy Parmer, MD

## 2017-06-20 NOTE — Anesthesia Post-Procedure Evaluation (Signed)
Procedure(s):  UPPER ENDOSCOPY WITH ESOPHAGEAL DILATATION 54Fr.    MAC    Anesthesia Post Evaluation      Multimodal analgesia: multimodal analgesia used between 6 hours prior to anesthesia start to PACU discharge  Patient location during evaluation: bedside  Patient participation: complete - patient participated  Level of consciousness: awake  Pain management: adequate  Airway patency: patent  Anesthetic complications: no  Cardiovascular status: stable  Respiratory status: acceptable  Hydration status: acceptable  Post anesthesia nausea and vomiting:  controlled      Vitals Value Taken Time   BP 108/65 06/20/2017  8:46 AM   Temp 36.1 ??C (97 ??F) 06/20/2017  8:46 AM   Pulse 83 06/20/2017  8:46 AM   Resp 17 06/20/2017  8:46 AM   SpO2 100 % 06/20/2017  8:46 AM

## 2017-06-21 MED FILL — DIPRIVAN 10 MG/ML INTRAVENOUS EMULSION: 10 mg/mL | INTRAVENOUS | Qty: 150

## 2017-06-21 MED FILL — LIDOCAINE (PF) 20 MG/ML (2 %) IJ SOLN: 20 mg/mL (2 %) | INTRAMUSCULAR | Qty: 80

## 2017-10-12 ENCOUNTER — Inpatient Hospital Stay: Payer: MEDICARE

## 2017-10-12 LAB — HCG QL SERUM
HCG, Ql.: NEGATIVE
HCG, Ql.: NEGATIVE

## 2017-10-12 MED ORDER — DEXTROSE 50% IN WATER (D50W) IV SYRG
INTRAVENOUS | Status: DC | PRN
Start: 2017-10-12 — End: 2017-10-12

## 2017-10-12 MED ORDER — LIDOCAINE (PF) 10 MG/ML (1 %) IJ SOLN
10 mg/mL (1 %) | INTRAMUSCULAR | Status: DC | PRN
Start: 2017-10-12 — End: 2017-10-12

## 2017-10-12 MED ORDER — GLUCAGON 1 MG INJECTION
1 mg | INTRAMUSCULAR | Status: DC | PRN
Start: 2017-10-12 — End: 2017-10-12

## 2017-10-12 MED ORDER — SODIUM CHLORIDE 0.9 % IJ SYRG
Freq: Three times a day (TID) | INTRAMUSCULAR | Status: DC
Start: 2017-10-12 — End: 2017-10-12

## 2017-10-12 MED ORDER — LACTATED RINGERS IV
INTRAVENOUS | Status: DC
Start: 2017-10-12 — End: 2017-10-12
  Administered 2017-10-12: 15:00:00 via INTRAVENOUS

## 2017-10-12 MED ORDER — GLUCOSE 4 GRAM CHEWABLE TAB
4 gram | ORAL | Status: DC | PRN
Start: 2017-10-12 — End: 2017-10-12

## 2017-10-12 MED ORDER — FAMOTIDINE (PF) 20 MG/2 ML IV
20 mg/2 mL | Freq: Once | INTRAVENOUS | Status: AC
Start: 2017-10-12 — End: 2017-10-12
  Administered 2017-10-12: 15:00:00 via INTRAVENOUS

## 2017-10-12 MED ORDER — LIDOCAINE (PF) 20 MG/ML (2 %) IJ SOLN
20 mg/mL (2 %) | INTRAMUSCULAR | Status: DC | PRN
Start: 2017-10-12 — End: 2017-10-12
  Administered 2017-10-12: 16:00:00 via INTRAVENOUS

## 2017-10-12 MED ORDER — INSULIN LISPRO 100 UNIT/ML INJECTION
100 unit/mL | Freq: Once | SUBCUTANEOUS | Status: DC
Start: 2017-10-12 — End: 2017-10-12

## 2017-10-12 MED ORDER — SODIUM CHLORIDE 0.9 % IJ SYRG
INTRAMUSCULAR | Status: DC | PRN
Start: 2017-10-12 — End: 2017-10-12

## 2017-10-12 MED ORDER — ONDANSETRON (PF) 4 MG/2 ML INJECTION
4 mg/2 mL | Freq: Once | INTRAMUSCULAR | Status: DC
Start: 2017-10-12 — End: 2017-10-12

## 2017-10-12 MED ORDER — FENTANYL CITRATE (PF) 50 MCG/ML IJ SOLN
50 mcg/mL | INTRAMUSCULAR | Status: DC | PRN
Start: 2017-10-12 — End: 2017-10-12

## 2017-10-12 MED ORDER — LACTATED RINGERS IV
INTRAVENOUS | Status: DC
Start: 2017-10-12 — End: 2017-10-12

## 2017-10-12 MED ORDER — PROPOFOL INFUSION
INTRAVENOUS | Status: DC | PRN
Start: 2017-10-12 — End: 2017-10-12
  Administered 2017-10-12: 16:00:00 via INTRAVENOUS

## 2017-10-12 MED ORDER — PROPOFOL 10 MG/ML IV EMUL
10 mg/mL | INTRAVENOUS | Status: DC | PRN
Start: 2017-10-12 — End: 2017-10-12
  Administered 2017-10-12: 16:00:00 via INTRAVENOUS

## 2017-10-12 MED FILL — LACTATED RINGERS IV: INTRAVENOUS | Qty: 1000

## 2017-10-12 MED FILL — BD POSIFLUSH NORMAL SALINE 0.9 % INJECTION SYRINGE: INTRAMUSCULAR | Qty: 40

## 2017-10-12 MED FILL — FAMOTIDINE (PF) 20 MG/2 ML IV: 20 mg/2 mL | INTRAVENOUS | Qty: 2

## 2017-10-12 NOTE — H&P (Signed)
[email or web address]  [phone]    GASTROENTEROLOGY Pre-Procedure H and P      Impression/Plan:   1. This patient is consented for a colonoscopy for LLQ pain    Chief Complaint: LLQ pain      HPI:  Tiffany [name] is a 47 y.o. female who is being is having a colonoscopy for LLQ pain  PMH:   Past Medical History:   Diagnosis Date   ??? Arthritis    ??? Vomiting     episodic       PSH:   Past Surgical History:   Procedure Laterality Date   ??? ABDOMEN SURGERY PROC UNLISTED  2017    Open abdomen management (14 days) after vascular injury during lumbar fusion   ??? HX ADENOIDECTOMY     ??? HX CHOLECYSTECTOMY  2013   ??? HX COLONOSCOPY      screening - neg results per patient   ??? HX ENDOSCOPY      screening - neg results per patient   ??? HX GYN      ectopic preg with tube rupture   ??? HX LUMBAR FUSION  0093    G1-W2 - complicated by vascular injury   ??? HX ORTHOPAEDIC      left foot surgery - stress fx   ??? HX ORTHOPAEDIC      L &R carpal tunnel   ??? HX TONSILLECTOMY     ??? VASCULAR SURGERY PROCEDURE UNLIST      vascular repair of aortic/venocaval repair.        Social HX:   Social History     Socioeconomic History   ??? Marital status: MARRIED     Spouse name: Not on file   ??? Number of children: Not on file   ??? Years of education: Not on file   ??? Highest education level: Not on file   Occupational History   ??? Not on file   Social Needs   ??? Financial resource strain: Not on file   ??? Food insecurity:     Worry: Not on file     Inability: Not on file   ??? Transportation needs:     Medical: Not on file     Non-medical: Not on file   Tobacco Use   ??? Smoking status: Never Smoker   ??? Smokeless tobacco: Never Used   Substance and Sexual Activity   ??? Alcohol use: No   ??? Drug use: No   ??? Sexual activity: Not on file   Lifestyle   ??? Physical activity:     Days per week: Not on file     Minutes per session: Not on file   ??? Stress: Not on file   Relationships   ??? Social connections:     Talks on phone: Not on file     Gets together: Not on file      Attends religious service: Not on file     Active member of club or organization: Not on file     Attends meetings of clubs or organizations: Not on file     Relationship status: Not on file   ??? Intimate partner violence:     Fear of current or ex partner: Not on file     Emotionally abused: Not on file     Physically abused: Not on file     Forced sexual activity: Not on file   Other Topics Concern   ??? Not on file   Social History Narrative   ???  Not on file       FHX:   Family History   Problem Relation Age of Onset   ??? Hypertension Mother    ??? Heart Disease Father        Allergy:   Allergies   Allergen Reactions   ??? Darvocet A500 [Propoxyphene N-Acetaminophen] Hives       Home Medications:     Medications Prior to Admission   Medication Sig   ??? traMADol (ULTRAM) 50 mg tablet Take 50 mg by mouth every eight (8) hours as needed for Pain.   ??? DULoxetine (CYMBALTA) 30 mg capsule TAKE 1 CAPSULE BY MOUTH DAILY   ??? ferrous sulfate (IRON) 325 mg (65 mg iron) tablet Take  by mouth Daily (before breakfast).   ??? melatonin 2.5 mg chew Take  by mouth.   ??? meloxicam (MOBIC) 15 mg tablet Take 15 mg by mouth daily.   ??? topiramate (TOPAMAX) 50 mg tablet Take  by mouth two (2) times a day.   ??? oxybutynin chloride XL (DITROPAN XL) 10 mg CR tablet Take 10 mg by mouth daily.   ??? HYDROcodone-acetaminophen (NORCO) 5-325 mg per tablet Take 1 Tab by mouth every four (4) hours as needed for Pain.       Review of Systems:     Constitutional: No fevers, chills, weight loss, fatigue.   Skin: No rashes, pruritis, jaundice, ulcerations, erythema.   HENT: No headaches, nosebleeds, sinus pressure, rhinorrhea, sore throat.   Eyes: No visual changes, blurred vision, eye pain, photophobia, jaundice.   Cardiovascular: No chest pain, heart palpitations.   Respiratory: No cough, SOB, wheezing, chest discomfort, orthopnea.   Gastrointestinal:    Genitourinary: No dysuria, bleeding, discharge, pyuria.    Musculoskeletal: No weakness, arthralgias, wasting.   Endo: No sweats.   Heme: No bruising, easy bleeding.   Allergies: As noted.   Neurological: Cranial nerves intact.  Alert and oriented. Gait not assessed.   Psychiatric:  No anxiety, depression, hallucinations.          Visit Vitals  BP 123/73   Pulse 82   Temp 97.8 ??F (36.6 ??C)   Resp 20   Ht 5\' 5"  (1.651 m)   Wt 153.8 kg (339 lb)   SpO2 100%   Breastfeeding? No   BMI 56.41 kg/m??       Physical Assessment:     constitutional: appearance: well developed, well nourished, normal habitus, no deformities, in no acute distress.   skin: inspection: no rashes, ulcers, icterus or other lesions; no clubbing or telangiectasias. palpation: no induration or subcutaneos nodules.   eyes: inspection: normal conjunctivae and lids; no jaundice pupils: normal  ENMT: mouth: normal oral mucosa,lips and gums; good dentition. oropharynx: normal tongue, hard and soft palate; posterior pharynx without erithema, exudate or lesions.   neck: thyroid: normal size, consistency and position; no masses or tenderness.   respiratory: effort: normal chest excursion; no intercostal retraction or accessory muscle use.   cardiovascular: abdominal aorta: normal size and position; no bruits. palpation: PMI of normal size and position; normal rhythm; no thrill or murmurs.   abdominal: abdomen: normal consistency; no tenderness or masses. hernias: no hernias appreciated. liver: normal size and consistency. spleen: not palpable.   rectal: hemoccult/guaiac: not performed.   musculoskeletal: digits and nails: no clubbing, cyanosis, petechiae or other inflammatory conditions. gait: normal gait and station head and neck: normal range of motion; no pain, crepitation or contracture. spine/ribs/pelvis: normal range of motion; no pain, deformity or contracture.  neurologic: cranial nerves: II-XII normal.   psychiatric: judgement/insight: within normal limits. memory: within  normal limits for recent and remote events. mood and affect: no evidence of depression, anxiety or agitation. orientation: oriented to time, space and person.        Basic Metabolic Profile   No results for input(s): NA, K, CL, CO2, BUN, GLU, CA, MG, PHOS in the last 72 hours.    No lab exists for component: CREAT      CBC w/Diff    No results for input(s): WBC, RBC, HGB, HCT, MCV, MCH, MCHC, RDW, PLT, HGBEXT, HCTEXT, PLTEXT in the last 72 hours.    No lab exists for component: MPV No results for input(s): GRANS, LYMPH, EOS, PRO, MYELO, METAS, BLAST in the last 72 hours.    No lab exists for component: MONO, BASO     Hepatic Function   No results for input(s): ALB, TP, TBILI, GPT, SGOT, AP, AML, LPSE in the last 72 hours.    No lab exists for component: DBILI     Coags   No results for input(s): PTP, INR, APTT in the last 72 hours.    No lab exists for component: Joycelyn Das, MD.  [facility], [place]  Cell: [phone]  Direct pager: [phone]  [email or web address]  [email or web address]

## 2017-10-12 NOTE — Anesthesia Post-Procedure Evaluation (Signed)
Procedure(s):  COLONOSCOPY.    MAC    Anesthesia Post Evaluation      Multimodal analgesia: multimodal analgesia used between 6 hours prior to anesthesia start to PACU discharge  Patient location during evaluation: bedside  Patient participation: complete - patient participated  Level of consciousness: awake  Pain management: adequate  Airway patency: patent  Anesthetic complications: no  Cardiovascular status: stable  Respiratory status: acceptable  Hydration status: acceptable  Post anesthesia nausea and vomiting:  controlled      Vitals Value Taken Time   BP 123/72 10/12/2017  1:02 PM   Temp 36.5 ??C (97.7 ??F) 10/12/2017 12:44 PM   Pulse 83 10/12/2017  1:02 PM   Resp 27 10/12/2017  1:02 PM   SpO2 100 % 10/12/2017  1:02 PM

## 2017-10-12 NOTE — Progress Notes (Signed)
[email or web address] [address]      Brief Procedure Note    Tiffany [name]  [birth year]  [id number]    Date of Procedure: 10/12/2017    Preoperative diagnosis: LEFT LOWER QUADRANT PAIN   CHRONIC CONSTIPATION  ALTERATION IN BOWEL ELIMINATION    Postoperative diagnosis: Poor prep    Type of Anesthesia: MAC (Monitored anesthesia care)    Description of findings: same as post op dx    Procedure: Procedure(s):  COLONOSCOPY    Operator:  Dr. Alferd Patee, MD    Assistant(s): Endoscopy Technician-1: Doug Sou  Endoscopy RN-1: Laurey Morale, RN; Heloise Beecham, RN; Leighton Ruff, RN    Devices/implants/grafts/tissues/prosthesis: None    TLX:BWIO    Specimens: * No specimens in log *    Findings: See printed and scanned procedure note    Complications: None    Dr. Alferd Patee, MD  10/12/2017  12:38 PM

## 2017-10-12 NOTE — Anesthesia Pre-Procedure Evaluation (Signed)
Relevant Problems   No relevant active problems       Anesthetic History     PONV          Review of Systems / Medical History  Patient summary reviewed and pertinent labs reviewed    Pulmonary  Within defined limits                 Neuro/Psych   Within defined limits           Cardiovascular  Within defined limits                Exercise tolerance: >4 METS     GI/Hepatic/Renal  Within defined limits              Endo/Other        Morbid obesity and arthritis     Other Findings              Physical Exam    Airway  Mallampati: III  TM Distance: 4 - 6 cm  Neck ROM: normal range of motion   Mouth opening: Normal     Cardiovascular    Rhythm: regular  Rate: normal         Dental  No notable dental hx       Pulmonary  Breath sounds clear to auscultation               Abdominal  GI exam deferred       Other Findings            Anesthetic Plan    ASA: 3  Anesthesia type: MAC            Anesthetic plan and risks discussed with: Patient

## 2017-10-12 NOTE — Progress Notes (Signed)
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 Alferd Patee, MD      Assistant(s): Endoscopy Technician-1: Doug Sou   Endoscopy RN-1: Laurey Morale, RN; Heloise Beecham, RN; Leighton Ruff, RN      Devices/implants/grafts/tissues/prosthesis: None      EPP:IRJJ      Specimens: * No specimens in log *      Findings: See printed and scanned procedure note      Complications: None      Dr. Alferd Patee, MD   10/12/2017  12:38 PM

## 2017-10-12 NOTE — H&P (Signed)
H&P by Alferd Patee, MD at  10/12/17 1118                Author: Alferd Patee, MD  Service: Gastroenterology  Author Type: Physician       Filed: 10/12/17 1119  Date of Service: 10/12/17 1118  Status: Signed          Editor: Alferd Patee, MD (Physician)                    [email or web address]   [phone]      GASTROENTEROLOGY Pre-Procedure H and P           Impression/Plan:     1. This patient is consented for a colonoscopy for LLQ pain      Chief Complaint: LLQ pain         HPI:   Tiffany [name] is a 47 y.o.  female who is being is having a colonoscopy for LLQ pain   PMH:      Past Medical History:        Diagnosis  Date         ?  Arthritis       ?  Vomiting            episodic           PSH:      Past Surgical History:         Procedure  Laterality  Date          ?  ABDOMEN SURGERY PROC UNLISTED    2017          Open abdomen management (14 days) after vascular injury during lumbar fusion          ?  HX ADENOIDECTOMY         ?  HX CHOLECYSTECTOMY    2013     ?  HX COLONOSCOPY              screening - neg results per patient          ?  HX ENDOSCOPY              screening - neg results per patient          ?  HX GYN              ectopic preg with tube rupture          ?  HX LUMBAR FUSION    2017          G9-F6 - complicated by vascular injury          ?  HX ORTHOPAEDIC              left foot surgery - stress fx          ?  HX ORTHOPAEDIC              L &R carpal tunnel          ?  HX TONSILLECTOMY         ?  VASCULAR SURGERY PROCEDURE UNLIST              vascular repair of aortic/venocaval repair.            Social HX:      Social History          Socioeconomic History         ?  Marital status:  MARRIED  Spouse name:  Not on file         ?  Number of children:  Not on file     ?  Years of education:  Not on file     ?  Highest education level:  Not on file       Occupational History        ?  Not on file       Social Needs         ?  Financial resource strain:  Not on file        ?  Food  insecurity:              Worry:  Not on file         Inability:  Not on file        ?  Transportation needs:              Medical:  Not on file         Non-medical:  Not on file       Tobacco Use         ?  Smoking status:  Never Smoker     ?  Smokeless tobacco:  Never Used       Substance and Sexual Activity         ?  Alcohol use:  No     ?  Drug use:  No     ?  Sexual activity:  Not on file       Lifestyle        ?  Physical activity:              Days per week:  Not on file         Minutes per session:  Not on file         ?  Stress:  Not on file       Relationships        ?  Social connections:              Talks on phone:  Not on file         Gets together:  Not on file         Attends religious service:  Not on file         Active member of club or organization:  Not on file         Attends meetings of clubs or organizations:  Not on file         Relationship status:  Not on file        ?  Intimate partner violence:              Fear of current or ex partner:  Not on file         Emotionally abused:  Not on file         Physically abused:  Not on file         Forced sexual activity:  Not on file        Other Topics  Concern        ?  Not on file       Social History Narrative        ?  Not on file           FHX:      Family History         Problem  Relation  Age of Onset          ?  Hypertension  Mother            ?  Heart Disease  Father             Allergy:      Allergies        Allergen  Reactions         ?  Darvocet A500 [Propoxyphene N-Acetaminophen]  Hives             Home Medications:          Medications Prior to Admission        Medication  Sig         ?  traMADol (ULTRAM) 50 mg tablet  Take 50 mg by mouth every eight (8) hours as needed for Pain.     ?  DULoxetine (CYMBALTA) 30 mg capsule  TAKE 1 CAPSULE BY MOUTH DAILY     ?  ferrous sulfate (IRON) 325 mg (65 mg iron) tablet  Take  by mouth Daily (before breakfast).     ?  melatonin 2.5 mg chew  Take  by mouth.     ?  meloxicam (MOBIC) 15 mg tablet   Take 15 mg by mouth daily.     ?  topiramate (TOPAMAX) 50 mg tablet  Take  by mouth two (2) times a day.     ?  oxybutynin chloride XL (DITROPAN XL) 10 mg CR tablet  Take 10 mg by mouth daily.         ?  HYDROcodone-acetaminophen (NORCO) 5-325 mg per tablet  Take 1 Tab by mouth every four (4) hours as needed for Pain.             Review of Systems:           Constitutional:  No fevers, chills, weight loss, fatigue.        Skin:  No rashes, pruritis, jaundice, ulcerations, erythema.     HENT:  No headaches, nosebleeds, sinus pressure, rhinorrhea, sore throat.     Eyes:  No visual changes, blurred vision, eye pain, photophobia, jaundice.     Cardiovascular:  No chest pain, heart palpitations.        Respiratory:  No cough, SOB, wheezing, chest discomfort, orthopnea.     Gastrointestinal:          Genitourinary:  No dysuria, bleeding, discharge, pyuria.        Musculoskeletal:  No weakness, arthralgias, wasting.     Endo:  No sweats.     Heme:  No bruising, easy bleeding.        Allergies:  As noted.        Neurological:  Cranial nerves intact.  Alert and oriented. Gait not assessed.        Psychiatric:   No anxiety, depression, hallucinations.               Visit Vitals      BP  123/73     Pulse  82     Temp  97.8 ??F (36.6 ??C)     Resp  20     Ht  5\' 5"  (1.651 m)     Wt  153.8 kg (339 lb)     SpO2  100%     Breastfeeding?  No        BMI  56.41 kg/m??             Physical Assessment:        constitutional: appearance: well developed, well nourished, normal  habitus, no deformities,  in no acute distress.   skin: inspection:  no rashes, ulcers, icterus or other lesions; no clubbing or telangiectasias. palpation: no induration or subcutaneos nodules.    eyes: inspection: normal conjunctivae and lids; no jaundice pupils:  normal   ENMT: mouth: normal oral mucosa,lips  and gums; good dentition. oropharynx: normal tongue, hard and soft palate; posterior pharynx without erithema, exudate or lesions.    neck: thyroid: normal  size, consistency and position; no masses or tenderness.    respiratory: effort: normal chest excursion; no intercostal retraction or accessory muscle  use.   cardiovascular: abdominal aorta:  normal size and position; no bruits. palpation: PMI of normal size and position; normal rhythm; no thrill or murmurs.    abdominal: abdomen: normal consistency; no tenderness or masses. hernias:  no hernias appreciated. liver: normal size and consistency. spleen:  not palpable.   rectal: hemoccult/guaiac: not performed.    musculoskeletal: digits and nails: no clubbing, cyanosis, petechiae or other inflammatory conditions.  gait: normal gait and station head and neck: normal range of motion; no pain, crepitation  or contracture. spine/ribs/pelvis: normal range of motion; no pain, deformity or contracture.    neurologic: cranial nerves: II-XII normal.    psychiatric: judgement/insight: within normal limits. memory:  within normal limits for recent and remote events. mood and affect: no evidence of depression, anxiety or agitation.  orientation: oriented to time, space and person.            Basic Metabolic Profile       No results for input(s): NA, K, CL, CO2, BUN, GLU, CA, MG, PHOS in the last 72 hours.      No lab exists for component: CREAT            CBC w/Diff          No results for input(s): WBC, RBC, HGB, HCT, MCV, MCH, MCHC, RDW, PLT, HGBEXT, HCTEXT, PLTEXT in the last 72 hours.      No lab exists for component: MPV  No results for input(s): GRANS, LYMPH, EOS, PRO, MYELO, METAS, BLAST in the last 72 hours.      No lab exists for component: MONO, BASO          Hepatic Function       No results for input(s): ALB, TP, TBILI, GPT, SGOT, AP, AML, LPSE in the last 72 hours.      No lab exists for component: DBILI          Coags       No results for input(s): PTP, INR, APTT in the last 72 hours.      No lab exists for component: Joycelyn Das, MD.   [facility], [place]   Cell: [phone]   Direct pager: [phone]   [email or web address]   [email or web address]

## 2017-10-12 NOTE — Anesthesia Post-Procedure Evaluation (Signed)
Procedure(s):  COLONOSCOPY.    MAC    Anesthesia Post Evaluation      Multimodal analgesia: multimodal analgesia used between 6 hours prior to anesthesia start to PACU discharge  Patient location during evaluation: bedside  Patient participation: complete - patient participated  Level of consciousness: awake  Pain management: adequate  Airway patency: patent  Anesthetic complications: no  Cardiovascular status: stable  Respiratory status: acceptable  Hydration status: acceptable  Post anesthesia nausea and vomiting:  controlled      Vitals Value Taken Time   BP 123/72 10/12/2017  1:02 PM   Temp 36.5 ??C (97.7 ??F) 10/12/2017 12:44 PM   Pulse 83 10/12/2017  1:02 PM   Resp 27 10/12/2017  1:02 PM   SpO2 100 % 10/12/2017  1:02 PM

## 2017-10-13 MED FILL — DIPRIVAN 10 MG/ML INTRAVENOUS EMULSION: 10 mg/mL | INTRAVENOUS | Qty: 50

## 2017-10-13 MED FILL — DIPRIVAN 10 MG/ML INTRAVENOUS EMULSION: 10 mg/mL | INTRAVENOUS | Qty: 215.32

## 2017-10-13 MED FILL — LIDOCAINE (PF) 20 MG/ML (2 %) IJ SOLN: 20 mg/mL (2 %) | INTRAMUSCULAR | Qty: 20

## 2018-07-24 ENCOUNTER — Encounter: Attending: Nurse Practitioner | Primary: Nurse Practitioner

## 2018-07-25 ENCOUNTER — Telehealth
Admit: 2018-07-25 | Discharge: 2018-07-25 | Payer: PRIVATE HEALTH INSURANCE | Attending: Nurse Practitioner | Primary: Nurse Practitioner

## 2018-07-25 ENCOUNTER — Telehealth: Attending: Nurse Practitioner | Primary: Nurse Practitioner

## 2018-07-25 DIAGNOSIS — Z7689 Persons encountering health services in other specified circumstances: Secondary | ICD-10-CM

## 2018-07-25 MED ORDER — POLYETHYLENE GLYCOL 3350 100 % ORAL POWDER
17 gram/dose | Freq: Every day | ORAL | 0 refills | Status: DC | PRN
Start: 2018-07-25 — End: 2018-10-10

## 2018-07-25 MED ORDER — TRAZODONE 50 MG TAB
50 mg | ORAL_TABLET | Freq: Every evening | ORAL | 0 refills | Status: DC
Start: 2018-07-25 — End: 2018-09-05

## 2018-07-25 MED ORDER — CYCLOBENZAPRINE 10 MG TAB
10 mg | ORAL_TABLET | Freq: Two times a day (BID) | ORAL | 1 refills | Status: DC | PRN
Start: 2018-07-25 — End: 2018-12-07

## 2018-07-25 MED ORDER — MELOXICAM 15 MG TAB
15 mg | ORAL_TABLET | Freq: Every day | ORAL | 0 refills | Status: DC | PRN
Start: 2018-07-25 — End: 2018-10-10

## 2018-07-25 MED ORDER — DULOXETINE 30 MG CAP, DELAYED RELEASE
30 mg | ORAL_CAPSULE | Freq: Every day | ORAL | 0 refills | Status: DC
Start: 2018-07-25 — End: 2018-11-26

## 2018-07-25 NOTE — Progress Notes (Signed)
Tiffany [name] presents today for   Chief Complaint   Patient presents with   ??? Establish Care   ??? Constipation     pt c/o hard stools, "stool is like cement"   ??? Sleep Problem     pt states she is unable to sleep at night         Depression Screening:  3 most recent PHQ Screens 07/25/2018   Little interest or pleasure in doing things Not at all   Feeling down, depressed, irritable, or hopeless Not at all   Total Score PHQ 2 0       Learning Assessment:  Learning Assessment 07/25/2018   PRIMARY LEARNER Patient   HIGHEST LEVEL OF EDUCATION - PRIMARY LEARNER  SOME COLLEGE   BARRIERS PRIMARY LEARNER NONE   PRIMARY LANGUAGE ENGLISH   LEARNER PREFERENCE PRIMARY DEMONSTRATION     PICTURES     VIDEOS   ANSWERED BY patient   RELATIONSHIP SELF       Abuse Screening:  Abuse Screening Questionnaire 07/25/2018   Do you ever feel afraid of your partner? N   Are you in a relationship with someone who physically or mentally threatens you? N   Is it safe for you to go home? Y       Fall Risk  Fall Risk Assessment, last 12 mths 07/25/2018   Able to walk? Yes   Fall in past 12 months? Yes   Fall with injury? No   Number of falls in past 12 months 1   Fall Risk Score 1         Health Maintenance reviewed and discussed and ordered per Provider.    Health Maintenance Due   Topic Date Due   ??? DTaP/Tdap/Td series (1 - Tdap) 08/09/1990   ??? PAP AKA CERVICAL CYTOLOGY  08/09/1990   ??? Lipid Screen  08/08/2009   ??? Medicare Yearly Exam  04/13/2018   .

## 2018-07-25 NOTE — Progress Notes (Signed)
.  [facility]  [place], [place]  [phone] office/[phone] fax      07/25/2018    Consent: Tiffany [name], who was seen by synchronous (real-time) audio-video technology, and/or her healthcare decision maker, is aware that this patient-initiated, Telehealth encounter on 07/25/2018 is a billable service, with coverage as determined by her insurance carrier. She is aware that she may receive a bill and has provided verbal consent to proceed: Yes.    Patient: Tiffany [name], [birth year], [id number]       Subjective:   Tiffany [name], a 48 y.o. female, who presents virtually to establish care. Pt lives with hubby , has 0 children, and is unemployed/disabled.      Current medications: Topamax 50mg  bid, cymbalta 30mg  every day, Tyl #3, flexeril 10mg  BID, iron 325//65 every day, meloxicam 15mg  every day, miralax every day prn.    Past Medical History:   Diagnosis Date   ??? AAA (abdominal aortic aneurysm) ([place]) 2017   ??? Arthritis    ??? Chronic constipation 07/25/2018    Dr Judie Grieve   ??? Chronic low back pain with sciatica 07/25/2018    Dr Berna Spare mgt   ??? Chronic pain of right knee 07/25/2018     Dr Vinson Moselle, ortho   ??? Constipation due to opioid therapy 07/25/2018   ??? DVT (deep venous thrombosis) ([place]) 2017    Left groin; Dr Sallee Provencal, vascular, places IVF preop   ??? Insomnia 07/25/2018   ??? Morbid obesity ([place]) 03/11/2016   ??? Spasm of back muscles 07/25/2018   ??? 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 Vomiting     episodic       Past Surgical History:   Procedure Laterality Date   ??? ABDOMEN SURGERY PROC UNLISTED  2017    Open abdomen management (14 days) after vascular injury during lumbar fusion   ??? COLONOSCOPY N/A 10/12/2017    COLONOSCOPY performed by Alferd Patee, MD at [facility] ENDOSCOPY   ??? HX ADENOIDECTOMY     ??? HX CHOLECYSTECTOMY  2013   ??? HX COLONOSCOPY  1999    screening - neg results per patient   ??? HX ENDOSCOPY       screening - neg results per patient   ??? HX GYN      ectopic preg with tube rupture   ??? HX KNEE REPLACEMENT Right 12/2017     Dr Vinson Moselle, ortho   ??? HX LUMBAR FUSION  3762    G3-T5 - complicated by vascular injury   ??? HX ORTHOPAEDIC      left foot surgery - stress fx   ??? HX ORTHOPAEDIC      L &R carpal tunnel   ??? HX TONSILLECTOMY     ??? IR IVC FILTER  2017    removed June 2017 noted in care everywhere   ??? VASCULAR SURGERY PROCEDURE UNLIST      vascular repair of aortic/venocaval repair.        Social History     Socioeconomic History   ??? Marital status: MARRIED     Spouse name: Not on file   ??? Number of children: Not on file   ??? Years of education: Not on file   ??? Highest education level: Not on file   Occupational History   ??? Not on file   Social Needs   ??? Financial resource strain: Not on  file   ??? Food insecurity     Worry: Not on file     Inability: Not on file   ??? Transportation needs     Medical: Not on file     Non-medical: Not on file   Tobacco Use   ??? Smoking status: Never Smoker   ??? Smokeless tobacco: Never Used   Substance and Sexual Activity   ??? Alcohol use: No   ??? Drug use: No   ??? Sexual activity: Yes     Partners: Male   Lifestyle   ??? Physical activity     Days per week: Not on file     Minutes per session: Not on file   ??? Stress: Not on file   Relationships   ??? Social Product manager on phone: Not on file     Gets together: Not on file     Attends religious service: Not on file     Active member of club or organization: Not on file     Attends meetings of clubs or organizations: Not on file     Relationship status: Not on file   ??? Intimate partner violence     Fear of current or ex partner: Not on file     Emotionally abused: Not on file     Physically abused: Not on file     Forced sexual activity: Not on file   Other Topics Concern   ??? Not on file   Social History Narrative   ??? Not on file       Allergies   Allergen Reactions   ??? Darvocet A500 [Propoxyphene N-Acetaminophen] Hives        Current Outpatient Medications on File Prior to Visit   Medication Sig Dispense Refill   ??? acetaminophen-codeine (Tylenol-Codeine #3) 300-30 mg per tablet Tylenol-Codeine #3 300 mg-30 mg tablet   Take 1 tablet every 8 hours by oral route as needed for 30 days.     ??? ferrous sulfate (IRON) 325 mg (65 mg iron) tablet Take  by mouth Daily (before breakfast).     ??? topiramate (TOPAMAX) 50 mg tablet Take  by mouth two (2) times a day.     ??? [DISCONTINUED] multivitamin, tx-iron-ca-min (THERA-M W/ IRON) 9 mg iron-400 mcg tab tablet Take 1 Tab by mouth daily. Indications: Lack in Minerals     ??? [DISCONTINUED] cyclobenzaprine (FLEXERIL) 10 mg tablet Take 10 mg by mouth two (2) times a day.     ??? [DISCONTINUED] DULoxetine (CYMBALTA) 30 mg capsule TAKE 1 CAPSULE BY MOUTH DAILY 30 Cap 0   ??? [DISCONTINUED] melatonin 2.5 mg chew Take  by mouth nightly.     ??? [DISCONTINUED] HYDROcodone-acetaminophen (NORCO) 5-325 mg per tablet Take 1 Tab by mouth every four (4) hours as needed for Pain. 12 Tab 0     No current facility-administered medications on file prior to visit.           Objective:   Review of Systems   Constitutional: Negative.    HENT: Negative.    Eyes:        Glasses for reading. Eye exam scheduled for 08/03/2018.   Respiratory: Negative for cough, shortness of breath and wheezing.    Cardiovascular: Negative for leg swelling (swelling left leg due to old blood clot. swelling goes down at night with elevation. Follow up with Dr Dorthula Perfect.).        Dr Dorthula Perfect, vascular, places IVF preop due to hx  of DVTs.    Gastrointestinal: Positive for constipation (due to past surgery and mesh placement. Also taking pain med that causes constipation. Last BM was sunday. Usually have BM 1x/w. Dulcolax works sometimes. .Had attempted colonoscopy in 2019 but unable to complete due to presence of stool-Dr Judie Grieve.). Negative for heartburn (due to red juice I was drinking), nausea and vomiting.         Chronic constipation was an issue before all the trouble occurred back in 2017 with my back surgery.    Musculoskeletal: Positive for back pain (Pain is lower back down both legs. pain score 5/10. tylenol #3 not really effective. Planning on changing me to the patch. ).        Appt with Dr Vinson Moselle, ortho, next week. Had RTK replacement Oct 2019.    Dr Arma Heading, pain mgt, prescribes Tyl #3 for chronic back pain.   Skin: Negative.    Neurological: Negative.    Endo/Heme/Allergies: Negative.    Psychiatric/Behavioral: Negative for depression, memory loss and substance abuse. The patient has insomnia (may get 2.5-3 hours sleep at night. started 2 weeks ago. used to take [place] in the past. ). The patient is not nervous/anxious (used to be treated for anxiety in the past but nothing recently. ).         Vital Signs: (As obtained by patient/caregiver at home)  There were no vitals taken for this visit.     PHYSICAL EXAMINATION:  [ INSTRUCTIONS:  "[x] " Indicates a positive item  "[] " Indicates a negative item  -- DELETE ALL ITEMS NOT EXAMINED]      Constitutional: [x]  Appears well-developed and well-nourished [x]  No apparent distress; morbidly obese      []  Abnormal -     Mental status: [x]  Alert and awake  [x]  Oriented to person/place/time [x]  Able to follow commands    []  Abnormal -     Eyes:   EOM    [x]   Normal    []  Abnormal -   Sclera  [x]   Normal    []  Abnormal -          Discharge [x]   None visible   []  Abnormal -     HENT, Neck: [x]  Normocephalic, atraumatic  []  Abnormal - .  [x]  Mouth/Throat: Mucous membranes are moist    Pulmonary/Chest: [x]  Respiratory effort normal   [x]  No visualized signs of difficulty breathing or respiratory distress        []  Abnormal -      Musculoskeletal:   [x]  Normal gait with no signs of ataxia. Pt was able to stand w/o difficulty and bend over to pick up 11 month old child.         [x]  Normal range of motion of neck        []  Abnormal -      Neurological:        [x]  No Facial Asymmetry (Cranial nerve 7 motor function) (limited exam due to video visit)          [x]  No gaze palsy        []  Abnormal -          Skin:        [x]  No significant exanthematous lesions or discoloration noted on facial skin         []  Abnormal -            Psychiatric:       [x]  Normal Affect []  Abnormal -        [  x] No Hallucinations    Other pertinent observable physical exam findings:- n/a    Assessment:    Tiffany [name] who has risk factors including (see above previous medical hx) and:       ICD-10-CM ICD-9-CM    1. Encounter to establish care Z76.89 V65.8    2. Insomnia, unspecified type G47.00 780.52 traZODone (DESYREL) 50 mg tablet   3. Chronic low back pain with sciatica, sciatica laterality unspecified, unspecified back pain laterality M54.40 724.2 cyclobenzaprine (FLEXERIL) 10 mg tablet    G89.29 724.3 DULoxetine (CYMBALTA) 30 mg capsule     338.29    4. Spasm of back muscles M62.830 724.8 cyclobenzaprine (FLEXERIL) 10 mg tablet   5. Status post right knee replacement Z96.651 V43.65 meloxicam (MOBIC) 15 mg tablet   6. Chronic pain of right knee M25.561 719.46 meloxicam (MOBIC) 15 mg tablet    G89.29 338.29    7. Chronic constipation K59.09 564.00 polyethylene glycol (MIRALAX) 17 gram/dose powder   8. Constipation due to opioid therapy K59.03 564.09 polyethylene glycol (MIRALAX) 17 gram/dose powder    T40.2X5A E935.2    9. Morbid obesity ([place]) E66.01 278.01    10. Screening cholesterol level Z13.220 V77.91 LIPID PANEL   11. Medication management Z79.899 V58.69    12. Healthcare maintenance Z00.00 V70.0      1. Encounter to establish care  Pt presents virtually to establish care. Pt lives with hubby , has 0 children, and is unemployed/disabled. She is currently babysitting a 4 month old. She is currently taking Topamax 50mg  bid, cymbalta 30mg  every day, Tyl #3, flexeril 10mg  BID, iron 325//65 every day, meloxicam 15mg  every day, miralax every day prn.     She used to see Dr Nicolette Bang, internal med, until she changed insurance. She follows Dr Dorthula Perfect, vascular, Dr Arma Heading, pain mgt, Dr Vinson Moselle, ortho, and Dr Judie Grieve, GI.       2. Insomnia, unspecified type  Instructed pt to be cautious when starting a new med and to avoid driving or operating heavy machinery until they know how the medication will affect them. Pt verb understanding.    Pt is to try this medication and if effective she will call office for refill. Pt has tried melatonin and OTC PM meds with no success.    - traZODone (DESYREL) 50 mg tablet; Take 0.5 Tabs by mouth nightly. insomnia  Dispense: 15 Tab; Refill: 0    3. Chronic low back pain with sciatica, sciatica laterality unspecified, unspecified back pain laterality  In 2017 in [place], [place], pt underwent back surgery for a herniated disc where she experienced vascular complications. She was in ICU critical care x14 days on a vent for recovery. She developed a DVT in left groin. Since then she has undergone 8 additional surgeries related to the vascular complication.     IVF filter must be placed by Dr Dorthula Perfect when patient needs to have a surgical procedure.    Refill - topamax refill not needed at this time per pt.     - cyclobenzaprine (FLEXERIL) 10 mg tablet; Take 1 Tab by mouth two (2) times daily as needed for Muscle Spasm(s).  Dispense: 60 Tab; Refill: 1  - DULoxetine (CYMBALTA) 30 mg capsule; Take 1 Cap by mouth daily. For nerve pain.  Dispense: 30 Cap; Refill: 0    4. Spasm of back muscles  Refill    - cyclobenzaprine (FLEXERIL) 10 mg tablet; Take 1 Tab by mouth two (2) times daily as needed for Muscle  Spasm(s).  Dispense: 60 Tab; Refill: 1    5. Status post right knee replacement  She has a RTK replacement by Dr Vinson Moselle, ortho, Oct 2019. He prescribes meloxicam for chronic knee pain.    - meloxicam (MOBIC) 15 mg tablet; Take 1 Tab by mouth daily as needed for Pain.  Dispense: 30 Tab; Refill: 0    6. Chronic pain of right knee     - meloxicam (MOBIC) 15 mg tablet; Take 1 Tab by mouth daily as needed for Pain.  Dispense: 30 Tab; Refill: 0    7. Chronic constipation  Pt has a long history of constipation prior to 2017. She had a colonoscopy in 1999 which was negative. Dr Judie Grieve attempted another colonoscopy last year but was unsuccessful due to stool being present. It is normal for this patient to have a BM 1x/w, according to pt. Pt has miralax on hand and uses as needed.    - polyethylene glycol (MIRALAX) 17 gram/dose powder; Take 17 g by mouth daily as needed for Constipation. Indications: constipation  Dispense: 116 g; Refill: 0    8. Constipation due to opioid therapy    - polyethylene glycol (MIRALAX) 17 gram/dose powder; Take 17 g by mouth daily as needed for Constipation. Indications: constipation  Dispense: 116 g; Refill: 0    9. Morbid obesity ([place])      10. Screening cholesterol level    - LIPID PANEL; Future    11. Healthcare maintenance   Last mammo in Jan-normal results per pt.  Last pap was Oct 2019-normal results per pt.    12. Medication management    Reviewed medication and completed the medication reconciliation with the patient. Reviewed side effects of medications with the patient. Questions were answered and patient verb understanding.      Verbal discussion of all information discussed above, pending tests ordered and future goals/plans completed. Patient expressed understanding of current diagnosis, planned testing, follow up and if needed to contact the office for any questions or concerns prior to the next visit.     Plan:       Labs needed this week: No      Orders Placed This Encounter   ??? LIPID PANEL     Standing Status:   Future     Standing Expiration Date:   01/07/2019   ??? acetaminophen-codeine (Tylenol-Codeine #3) 300-30 mg per tablet     Sig: Tylenol-Codeine #3 300 mg-30 mg tablet   Take 1 tablet every 8 hours by oral route as needed for 30 days.   ??? traZODone (DESYREL) 50 mg tablet      Sig: Take 0.5 Tabs by mouth nightly. insomnia     Dispense:  15 Tab     Refill:  0   ??? cyclobenzaprine (FLEXERIL) 10 mg tablet     Sig: Take 1 Tab by mouth two (2) times daily as needed for Muscle Spasm(s).     Dispense:  60 Tab     Refill:  1   ??? meloxicam (MOBIC) 15 mg tablet     Sig: Take 1 Tab by mouth daily as needed for Pain.     Dispense:  30 Tab     Refill:  0     Dr Vinson Moselle, ortho, prescribes this med.   ??? DULoxetine (CYMBALTA) 30 mg capsule     Sig: Take 1 Cap by mouth daily. For nerve pain.     Dispense:  30 Cap     Refill:  0  Picking up refill today 07/25/2018.   ??? polyethylene glycol (MIRALAX) 17 gram/dose powder     Sig: Take 17 g by mouth daily as needed for Constipation. Indications: constipation     Dispense:  116 g     Refill:  0     Current Outpatient Medications   Medication Sig Dispense Refill   ??? acetaminophen-codeine (Tylenol-Codeine #3) 300-30 mg per tablet Tylenol-Codeine #3 300 mg-30 mg tablet   Take 1 tablet every 8 hours by oral route as needed for 30 days.     ??? traZODone (DESYREL) 50 mg tablet Take 0.5 Tabs by mouth nightly. insomnia 15 Tab 0   ??? cyclobenzaprine (FLEXERIL) 10 mg tablet Take 1 Tab by mouth two (2) times daily as needed for Muscle Spasm(s). 60 Tab 1   ??? meloxicam (MOBIC) 15 mg tablet Take 1 Tab by mouth daily as needed for Pain. 30 Tab 0   ??? DULoxetine (CYMBALTA) 30 mg capsule Take 1 Cap by mouth daily. For nerve pain. 30 Cap 0   ??? polyethylene glycol (MIRALAX) 17 gram/dose powder Take 17 g by mouth daily as needed for Constipation. Indications: constipation 116 g 0   ??? ferrous sulfate (IRON) 325 mg (65 mg iron) tablet Take  by mouth Daily (before breakfast).     ??? topiramate (TOPAMAX) 50 mg tablet Take  by mouth two (2) times a day.         Follow-up and Dispositions    ?? Return in about 4 months (around 11/24/2018).         [facility]  Please call and schedule patient for 4 months follow up. They will need fasting labs 1 week prior to appt. Thank you!        [facility]  Placed referral and other orders: No      Dr. Donald Prose. Dericka Ostenson, DNP, [employer]   [facility]      I spent at least 30 minutes with this new patient, and >50% of the time was spent counseling and/or coordinating care regarding med mgt, HM, chronic pain syndrome

## 2018-07-25 NOTE — Progress Notes (Signed)
 Tiffany [name] presents today for   Chief Complaint   Patient presents with   . Establish Care   . Constipation     pt c/o hard stools, stool is like cement   . Sleep Problem     pt states she is unable to sleep at night         Depression Screening:  3 most recent PHQ Screens 07/25/2018   Little interest or pleasure in doing things Not at all   Feeling down, depressed, irritable, or hopeless Not at all   Total Score PHQ 2 0       Learning Assessment:  Learning Assessment 07/25/2018   PRIMARY LEARNER Patient   HIGHEST LEVEL OF EDUCATION - PRIMARY LEARNER  SOME COLLEGE   BARRIERS PRIMARY LEARNER NONE   PRIMARY LANGUAGE ENGLISH   LEARNER PREFERENCE PRIMARY DEMONSTRATION     PICTURES     VIDEOS   ANSWERED BY patient   RELATIONSHIP SELF       Abuse Screening:  Abuse Screening Questionnaire 07/25/2018   Do you ever feel afraid of your partner? N   Are you in a relationship with someone who physically or mentally threatens you? N   Is it safe for you to go home? Y       Fall Risk  Fall Risk Assessment, last 12 mths 07/25/2018   Able to walk? Yes   Fall in past 12 months? Yes   Fall with injury? No   Number of falls in past 12 months 1   Fall Risk Score 1         Health Maintenance reviewed and discussed and ordered per Provider.    Health Maintenance Due   Topic Date Due   . DTaP/Tdap/Td series (1 - Tdap) 08/09/1990   . PAP AKA CERVICAL CYTOLOGY  08/09/1990   . Lipid Screen  08/08/2009   . Medicare Yearly Exam  04/13/2018   .

## 2018-07-25 NOTE — Progress Notes (Signed)
.  [facility]  [place], [place]  [phone] [phone] fax      07/25/2018    Consent: Tiffany [name], who was seen by synchronous (real-time) audio-video technology, and/or her healthcare decision maker, is aware that this patient-initiated, Telehealth encounter on 07/25/2018 is a billable service, with coverage as determined by her insurance carrier. She is aware that she may receive a bill and has provided verbal consent to proceed: Yes.    Patient: Tiffany [name], 1970-08-11, [id number]       Subjective:   [name], a 48 y.o. female, who presents virtually to establish care. Pt lives with hubby , has 0 children, and is unemployed/disabled.      Current medications: Topamax 50mg  bid, cymbalta 30mg  every day, Tyl #3, flexeril 10mg  BID, iron 325//65 every day, meloxicam 15mg  every day, miralax every day prn.    Past Medical History:   Diagnosis Date   ??? AAA (abdominal aortic aneurysm) ([place]) 2017   ??? Arthritis    ??? Chronic constipation 07/25/2018    Dr Judie Grieve   ??? Chronic low back pain with sciatica 07/25/2018    Dr Berna Spare mgt   ??? Chronic pain of right knee 07/25/2018     Dr Vinson Moselle, ortho   ??? Constipation due to opioid therapy 07/25/2018   ??? DVT (deep venous thrombosis) ([place]) 2017    Left groin; Dr Sallee Provencal, vascular, places IVF preop   ??? Insomnia 07/25/2018   ??? Morbid obesity ([place]) 03/11/2016   ??? Spasm of back muscles 07/25/2018   ??? 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 Vomiting     episodic       Past Surgical History:   Procedure Laterality Date   ??? ABDOMEN SURGERY PROC UNLISTED  2017    Open abdomen management (14 days) after vascular injury during lumbar fusion   ??? COLONOSCOPY N/A 10/12/2017    COLONOSCOPY performed by Alferd Patee, MD at [facility] ENDOSCOPY   ??? HX ADENOIDECTOMY     ??? HX CHOLECYSTECTOMY  2013   ??? HX COLONOSCOPY  1999    screening - neg results per patient   ??? HX ENDOSCOPY      screening -  neg results per patient   ??? HX GYN      ectopic preg with tube rupture   ??? HX KNEE REPLACEMENT Right 12/2017     Dr Vinson Moselle, ortho   ??? HX LUMBAR FUSION  4332    R5-J8 - complicated by vascular injury   ??? HX ORTHOPAEDIC      left foot surgery - stress fx   ??? HX ORTHOPAEDIC      L &R carpal tunnel   ??? HX TONSILLECTOMY     ??? IR IVC FILTER  2017    removed June 2017 noted in care everywhere   ??? VASCULAR SURGERY PROCEDURE UNLIST      vascular repair of aortic/venocaval repair.        Social History     Socioeconomic History   ??? Marital status: MARRIED     Spouse name: Not on file   ??? Number of children: Not on file   ??? Years of education: Not on file   ??? Highest education level: Not on file   Occupational History   ??? Not on file   Social Needs   ??? Financial resource strain: Not on  file   ??? Food insecurity     Worry: Not on file     Inability: Not on file   ??? Transportation needs     Medical: Not on file     Non-medical: Not on file   Tobacco Use   ??? Smoking status: Never Smoker   ??? Smokeless tobacco: Never Used   Substance and Sexual Activity   ??? Alcohol use: No   ??? Drug use: No   ??? Sexual activity: Yes     Partners: Male   Lifestyle   ??? Physical activity     Days per week: Not on file     Minutes per session: Not on file   ??? Stress: Not on file   Relationships   ??? Social Product manager on phone: Not on file     Gets together: Not on file     Attends religious service: Not on file     Active member of club or organization: Not on file     Attends meetings of clubs or organizations: Not on file     Relationship status: Not on file   ??? Intimate partner violence     Fear of current or ex partner: Not on file     Emotionally abused: Not on file     Physically abused: Not on file     Forced sexual activity: Not on file   Other Topics Concern   ??? Not on file   Social History Narrative   ??? Not on file       Allergies   Allergen Reactions   ??? Darvocet A500 [Propoxyphene N-Acetaminophen] Hives       Current Outpatient  Medications on File Prior to Visit   Medication Sig Dispense Refill   ??? acetaminophen-codeine (Tylenol-Codeine #3) 300-30 mg per tablet Tylenol-Codeine #3 300 mg-30 mg tablet   Take 1 tablet every 8 hours by oral route as needed for 30 days.     ??? ferrous sulfate (IRON) 325 mg (65 mg iron) tablet Take  by mouth Daily (before breakfast).     ??? topiramate (TOPAMAX) 50 mg tablet Take  by mouth two (2) times a day.     ??? [DISCONTINUED] multivitamin, tx-iron-ca-min (THERA-M W/ IRON) 9 mg iron-400 mcg tab tablet Take 1 Tab by mouth daily. Indications: Lack in Minerals     ??? [DISCONTINUED] cyclobenzaprine (FLEXERIL) 10 mg tablet Take 10 mg by mouth two (2) times a day.     ??? [DISCONTINUED] DULoxetine (CYMBALTA) 30 mg capsule TAKE 1 CAPSULE BY MOUTH DAILY 30 Cap 0   ??? [DISCONTINUED] melatonin 2.5 mg chew Take  by mouth nightly.     ??? [DISCONTINUED] HYDROcodone-acetaminophen (NORCO) 5-325 mg per tablet Take 1 Tab by mouth every four (4) hours as needed for Pain. 12 Tab 0     No current facility-administered medications on file prior to visit.           Objective:   Review of Systems   Constitutional: Negative.    HENT: Negative.    Eyes:        Glasses for reading. Eye exam scheduled for 08/03/2018.   Respiratory: Negative for cough, shortness of breath and wheezing.    Cardiovascular: Negative for leg swelling (swelling left leg due to old blood clot. swelling goes down at night with elevation. Follow up with Dr Dorthula Perfect.).        Dr Dorthula Perfect, vascular, places IVF preop due to hx  of DVTs.    Gastrointestinal: Positive for constipation (due to past surgery and mesh placement. Also taking pain med that causes constipation. Last BM was sunday. Usually have BM 1x/w. Dulcolax works sometimes. .Had attempted colonoscopy in 2019 but unable to complete due to presence of stool-Dr Judie Grieve.). Negative for heartburn (due to red juice I was drinking), nausea and vomiting.        Chronic constipation was an issue before all the trouble occurred  back in 2017 with my back surgery.    Musculoskeletal: Positive for back pain (Pain is lower back down both legs. pain score 5/10. tylenol #3 not really effective. Planning on changing me to the patch. ).        Appt with Dr Vinson Moselle, ortho, next week. Had RTK replacement Oct 2019.    Dr Arma Heading, pain mgt, prescribes Tyl #3 for chronic back pain.   Skin: Negative.    Neurological: Negative.    Endo/Heme/Allergies: Negative.    Psychiatric/Behavioral: Negative for depression, memory loss and substance abuse. The patient has insomnia (may get 2.5-3 hours sleep at night. started 2 weeks ago. used to take [place] in the past. ). The patient is not nervous/anxious (used to be treated for anxiety in the past but nothing recently. ).         Vital Signs: (As obtained by patient/caregiver at home)  There were no vitals taken for this visit.     PHYSICAL EXAMINATION:  [ INSTRUCTIONS:  "[x] " Indicates a positive item  "[] " Indicates a negative item  -- DELETE ALL ITEMS NOT EXAMINED]      Constitutional: [x]  Appears well-developed and well-nourished [x]  No apparent distress; morbidly obese      []  Abnormal -     Mental status: [x]  Alert and awake  [x]  Oriented to person/place/time [x]  Able to follow commands    []  Abnormal -     Eyes:   EOM    [x]   Normal    []  Abnormal -   Sclera  [x]   Normal    []  Abnormal -          Discharge [x]   None visible   []  Abnormal -     HENT, Neck: [x]  Normocephalic, atraumatic  []  Abnormal - .  [x]  Mouth/Throat: Mucous membranes are moist    Pulmonary/Chest: [x]  Respiratory effort normal   [x]  No visualized signs of difficulty breathing or respiratory distress        []  Abnormal -      Musculoskeletal:   [x]  Normal gait with no signs of ataxia. Pt was able to stand w/o difficulty and bend over to pick up 71 month old child.         [x]  Normal range of motion of neck        []  Abnormal -     Neurological:        [x]  No Facial Asymmetry (Cranial nerve 7 motor function) (limited exam due to video  visit)          [x]  No gaze palsy        []  Abnormal -          Skin:        [x]  No significant exanthematous lesions or discoloration noted on facial skin         []  Abnormal -            Psychiatric:       [x]  Normal Affect []  Abnormal -        [  x] No Hallucinations    Other pertinent observable physical exam findings:- n/a    Assessment:    [name] who has risk factors including (see above previous medical hx) and:       ICD-10-CM ICD-9-CM    1. Encounter to establish care Z76.89 V65.8    2. Insomnia, unspecified type G47.00 780.52 traZODone (DESYREL) 50 mg tablet   3. Chronic low back pain with sciatica, sciatica laterality unspecified, unspecified back pain laterality M54.40 724.2 cyclobenzaprine (FLEXERIL) 10 mg tablet    G89.29 724.3 DULoxetine (CYMBALTA) 30 mg capsule     338.29    4. Spasm of back muscles M62.830 724.8 cyclobenzaprine (FLEXERIL) 10 mg tablet   5. Status post right knee replacement Z96.651 V43.65 meloxicam (MOBIC) 15 mg tablet   6. Chronic pain of right knee M25.561 719.46 meloxicam (MOBIC) 15 mg tablet    G89.29 338.29    7. Chronic constipation K59.09 564.00 polyethylene glycol (MIRALAX) 17 gram/dose powder   8. Constipation due to opioid therapy K59.03 564.09 polyethylene glycol (MIRALAX) 17 gram/dose powder    T40.2X5A E935.2    9. Morbid obesity ([place]) E66.01 278.01    10. Screening cholesterol level Z13.220 V77.91 LIPID PANEL   11. Medication management Z79.899 V58.69    12. Healthcare maintenance Z00.00 V70.0      1. Encounter to establish care  Pt presents virtually to establish care. Pt lives with hubby , has 0 children, and is unemployed/disabled. 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 She is currently taking Topamax 50mg  bid, cymbalta 30mg  every day, Tyl #3, flexeril 10mg  BID, iron 325//65 every day, meloxicam 15mg  every day, miralax every day prn.    She used to see Dr Nicolette Bang, internal med, until she changed insurance. She follows Dr Dorthula Perfect, vascular, Dr  Arma Heading, pain mgt, Dr Vinson Moselle, ortho, and Dr Judie Grieve, GI.       2. Insomnia, unspecified type  Instructed pt to be cautious when starting a new med and to avoid driving or operating heavy machinery until they know how the medication will affect them. Pt verb understanding.    Pt is to try this medication and if effective she will call office for refill. Pt has tried melatonin and OTC PM meds with no success.    - traZODone (DESYREL) 50 mg tablet; Take 0.5 Tabs by mouth nightly. insomnia  Dispense: 15 Tab; Refill: 0    3. Chronic low back pain with sciatica, sciatica laterality unspecified, unspecified back pain laterality  In 2017 in [place], [place], pt underwent back surgery for a herniated disc where she experienced vascular complications. She was in ICU critical care x14 days on a vent for recovery. She developed a DVT in left groin. Since then she has undergone 8 additional surgeries related to the vascular complication.     IVF filter must be placed by Dr Dorthula Perfect when patient needs to have a surgical procedure.    Refill - topamax refill not needed at this time per pt.     - cyclobenzaprine (FLEXERIL) 10 mg tablet; Take 1 Tab by mouth two (2) times daily as needed for Muscle Spasm(s).  Dispense: 60 Tab; Refill: 1  - DULoxetine (CYMBALTA) 30 mg capsule; Take 1 Cap by mouth daily. For nerve pain.  Dispense: 30 Cap; Refill: 0    4. Spasm of back muscles  Refill    - cyclobenzaprine (FLEXERIL) 10 mg tablet; Take 1 Tab by mouth two (2) times daily as needed for Muscle  Spasm(s).  Dispense: 60 Tab; Refill: 1    5. Status post right knee replacement  She has a RTK replacement by Dr Vinson Moselle, ortho, Oct 2019. He prescribes meloxicam for chronic knee pain.    - meloxicam (MOBIC) 15 mg tablet; Take 1 Tab by mouth daily as needed for Pain.  Dispense: 30 Tab; Refill: 0    6. Chronic pain of right knee    - meloxicam (MOBIC) 15 mg tablet; Take 1 Tab by mouth daily as needed for Pain.  Dispense: 30 Tab; Refill: 0    7. Chronic  constipation  Pt has a long history of constipation prior to 2017. She had a colonoscopy in 1999 which was negative. Dr Judie Grieve attempted another colonoscopy last year but was unsuccessful due to stool being present. It is normal for this patient to have a BM 1x/w, according to pt. Pt has miralax on hand and uses as needed.    - polyethylene glycol (MIRALAX) 17 gram/dose powder; Take 17 g by mouth daily as needed for Constipation. Indications: constipation  Dispense: 116 g; Refill: 0    8. Constipation due to opioid therapy    - polyethylene glycol (MIRALAX) 17 gram/dose powder; Take 17 g by mouth daily as needed for Constipation. Indications: constipation  Dispense: 116 g; Refill: 0    9. Morbid obesity ([place])      10. Screening cholesterol level    - LIPID PANEL; Future    11. Healthcare maintenance   Last mammo in Jan-normal results per pt.  Last pap was Oct 2019-normal results per pt.    12. Medication management    Reviewed medication and completed the medication reconciliation with the patient. Reviewed side effects of medications with the patient. Questions were answered and patient verb understanding.      Verbal discussion of all information discussed above, pending tests ordered and future goals/plans completed. Patient expressed understanding of current diagnosis, planned testing, follow up and if needed to contact the office for any questions or concerns prior to the next visit.     Plan:       Labs needed this week: No      Orders Placed This Encounter   ??? LIPID PANEL     Standing Status:   Future     Standing Expiration Date:   01/07/2019   ??? acetaminophen-codeine (Tylenol-Codeine #3) 300-30 mg per tablet     Sig: Tylenol-Codeine #3 300 mg-30 mg tablet   Take 1 tablet every 8 hours by oral route as needed for 30 days.   ??? traZODone (DESYREL) 50 mg tablet     Sig: Take 0.5 Tabs by mouth nightly. insomnia     Dispense:  15 Tab     Refill:  0   ??? cyclobenzaprine (FLEXERIL) 10 mg tablet     Sig: Take 1 Tab by  mouth two (2) times daily as needed for Muscle Spasm(s).     Dispense:  60 Tab     Refill:  1   ??? meloxicam (MOBIC) 15 mg tablet     Sig: Take 1 Tab by mouth daily as needed for Pain.     Dispense:  30 Tab     Refill:  0     Dr Vinson Moselle, ortho, prescribes this med.   ??? DULoxetine (CYMBALTA) 30 mg capsule     Sig: Take 1 Cap by mouth daily. For nerve pain.     Dispense:  30 Cap     Refill:  0  Picking up refill today 07/25/2018.   ??? polyethylene glycol (MIRALAX) 17 gram/dose powder     Sig: Take 17 g by mouth daily as needed for Constipation. Indications: constipation     Dispense:  116 g     Refill:  0     Current Outpatient Medications   Medication Sig Dispense Refill   ??? acetaminophen-codeine (Tylenol-Codeine #3) 300-30 mg per tablet Tylenol-Codeine #3 300 mg-30 mg tablet   Take 1 tablet every 8 hours by oral route as needed for 30 days.     ??? traZODone (DESYREL) 50 mg tablet Take 0.5 Tabs by mouth nightly. insomnia 15 Tab 0   ??? cyclobenzaprine (FLEXERIL) 10 mg tablet Take 1 Tab by mouth two (2) times daily as needed for Muscle Spasm(s). 60 Tab 1   ??? meloxicam (MOBIC) 15 mg tablet Take 1 Tab by mouth daily as needed for Pain. 30 Tab 0   ??? DULoxetine (CYMBALTA) 30 mg capsule Take 1 Cap by mouth daily. For nerve pain. 30 Cap 0   ??? polyethylene glycol (MIRALAX) 17 gram/dose powder Take 17 g by mouth daily as needed for Constipation. Indications: constipation 116 g 0   ??? ferrous sulfate (IRON) 325 mg (65 mg iron) tablet Take  by mouth Daily (before breakfast).     ??? topiramate (TOPAMAX) 50 mg tablet Take  by mouth two (2) times a day.         Follow-up and Dispositions    ?? Return in about 4 months (around 11/24/2018).         [facility] Front Desk  Please call and schedule patient for 4 months follow up. They will need fasting labs 1 week prior to appt. Thank you!       [facility]  Placed referral and other orders: No      Dr. Donald Prose. Caymen Dubray, DNP, [employer]   [facility]      I spent at least 30 minutes  with this new patient, and >50% of the time was spent counseling and/or coordinating care regarding med mgt, HM, chronic pain syndrome

## 2018-08-20 ENCOUNTER — Encounter

## 2018-08-20 NOTE — Telephone Encounter (Signed)
Refill for trazodone was declined, patient is to call the office with an update on the effectiveness of medication prior to getting refills.    Requested Prescriptions     Refused Prescriptions Disp Refills   ??? traZODone (DESYREL) 50 mg tablet [Pharmacy Med Name: traZODone 50 MG TABLET] 15 Tab 0     Sig: TAKE ONE-HALF TABLET BY MOUTH EVERY NIGHT FOR INSOMNIA     Refused By: Alla Feeling     Reason for Refusal: Have pt. call me

## 2018-08-21 ENCOUNTER — Encounter

## 2018-08-21 NOTE — Telephone Encounter (Signed)
-----   Message from Deirdre Priest sent at 08/15/2018  4:07 PM EDT -----  Regarding: medication  Pt had a visit with NP [facility] on June 17th. She would like the provider to call in [place] (bladder control medication). If any questions call pt at [phone]

## 2018-08-21 NOTE — Telephone Encounter (Signed)
I believe the medication she was trying to request was myrbetriq. Will confirm and ask additional questions when patient calls back.

## 2018-08-21 NOTE — Telephone Encounter (Signed)
Yes please inquire about her urinary symptoms.  She does not have a history of any urinary issues so we may need to refer her to urology.  I will wait for your response.  Thank you

## 2018-08-21 NOTE — Telephone Encounter (Signed)
Attempted to reach patient at the number documented.  Was unsuccessful.  Did not leave a voicemail as her name was not identified.      When she calls back please have the nurse verify what it is that she is looking for and her symptoms.  She is currently not on any medications for urinary issues nor did she mention this to me at her appointment.  Also the medication that was documented could not be found in her chart nor in a medical book.  I believe someone misspelled it.  Thank you

## 2018-08-24 ENCOUNTER — Encounter

## 2018-08-24 MED ORDER — MIRABEGRON ER 25 MG TABLET,EXTENDED RELEASE 24 HR
25 mg | ORAL_TABLET | Freq: Every day | ORAL | 2 refills | Status: DC
Start: 2018-08-24 — End: 2018-11-26

## 2018-08-24 NOTE — Addendum Note (Signed)
Addended by: Nelida Meuse on: 08/24/2018 11:24 AM     Modules accepted: Orders

## 2018-08-24 NOTE — Telephone Encounter (Signed)
LMTCB

## 2018-08-24 NOTE — Telephone Encounter (Signed)
Spoke with patient she reports being on Myrbetriq 25mg  in the past for bladder control. She reports being prescribed this by the [facility]. She said she saw a urologist there as well they had one that would come every so often to the clinic. Last fill she reports was a few months ago. Found medication in care everywhere. Patient reports she was taking it twice a day medication was listed as once a day in records. Patient states she feels like she needs to be on this medication again because she is have frequent incontinence reports urinating and doesn't realize it.

## 2018-08-24 NOTE — Addendum Note (Signed)
Addendum Note by Nelida Meuse, LPN at 56/38/93 [id number]                Author: Nelida Meuse, LPN  Service: --  Author Type: Licensed Nurse       Filed: 08/24/18 1124  Encounter Date: 08/21/2018  Status: Signed          Editor: Nelida Meuse, LPN (Licensed Nurse)          Addended by: Nelida Meuse on: 08/24/2018 11:24 AM    Modules accepted: Orders

## 2018-09-05 ENCOUNTER — Telehealth
Admit: 2018-09-05 | Discharge: 2018-09-05 | Payer: PRIVATE HEALTH INSURANCE | Attending: Nurse Practitioner | Primary: Nurse Practitioner

## 2018-09-05 ENCOUNTER — Telehealth: Attending: Nurse Practitioner | Primary: Nurse Practitioner

## 2018-09-05 DIAGNOSIS — G4700 Insomnia, unspecified: Secondary | ICD-10-CM

## 2018-09-05 MED ORDER — TRAZODONE 50 MG TAB
50 mg | ORAL_TABLET | Freq: Every evening | ORAL | 5 refills | Status: DC
Start: 2018-09-05 — End: 2019-03-11

## 2018-09-05 NOTE — Progress Notes (Signed)
Phone conversation completed with patient today.  Purpose of the phone conversation was follow-up on medication effectiveness.      Patient states she started taking the trazodone 50 mg 1/2 tablet every night which was effective up until the second week into therapy.  She decided to increase her trazodone on her own to 1 tablet nightly for her insomnia.  She states this works very well.    Informed patient a refill of Myrbetriq was E scribed back on 08/24/2018 per patient's request. Patient states she just received the Myrbetriq from the pharmacy last week.  She states Dr Abby Potash at [facility] clinic started her on this med due to urge to urinate.  Medication is very effective.  She also saw Dr. Romero Belling wife, Dr. Abby Potash urologist, while still inpatient at [facility] clinic and she concurred with the treatment due to the effectiveness.      Patient states the NP via her insurance came by to see her last Thursday.  She had reported to the NP that she fell in her bathroom back in April.  She was getting in the shower and slipped in the water hitting her head but causing no injuries.  Patient states the NP wanted this documented in her chart.    Patient denies any other falls except for the last Tuesday when she fell in the hallway in her home while being chased by an aggressive Rottweiler that a neighbor had given her.  She states she landed on her left shoulder and now her left shoulder is achy.  She denies any limitations with movement.  She denies any need for treatment.  She has a regularly scheduled appointment with Ortho in 2 weeks.     Insomnia, unspecified type    - traZODone (DESYREL) 50 mg tablet; Take 1 Tab by mouth nightly. insomnia  Dispense: 30 Tab; Refill: 5    History of fall        This conversation lasted 14 minutes.

## 2018-09-05 NOTE — Progress Notes (Signed)
Phone conversation completed with patient today.  Purpose of the phone conversation was follow-up on medication effectiveness.      Patient states she started taking the trazodone 50 mg 1/2 tablet every night which was effective up until the second week into therapy.  She decided to increase her trazodone on her own to 1 tablet nightly for her insomnia.  She states this works very well.    Informed patient a refill of Myrbetriq was E scribed back on 08/24/2018 per patient's request. Patient states she just received the Myrbetriq from the pharmacy last week.  She states Dr Abby Potash at [facility] clinic started her on this med due to urge to urinate.  Medication is very effective.  She also saw Dr. Romero Belling wife, Dr. Abby Potash urologist, while still inpatient at [facility] clinic and she concurred with the treatment due to the effectiveness.      Patient states the NP via her insurance came by to see her last Thursday.  She had reported to the NP that she fell in her bathroom back in April.  She was getting in the shower and slipped in the water hitting her head but causing no injuries.  Patient states the NP wanted this documented in her chart.    Patient denies any other falls except for the last Tuesday when she fell in the hallway in her home while being chased by an aggressive Rottweiler that a neighbor had given her.  She states she landed on her left shoulder and now her left shoulder is achy.  She denies any limitations with movement.  She denies any need for treatment.  She has a regularly scheduled appointment with Ortho in 2 weeks.     Insomnia, unspecified type    - traZODone (DESYREL) 50 mg tablet; Take 1 Tab by mouth nightly. insomnia  Dispense: 30 Tab; Refill: 5    History of fall        This conversation lasted 14 minutes.

## 2018-10-01 NOTE — Telephone Encounter (Signed)
Patient stating she fell off her bed last Sunday. She went to the ER on Tues and was diagnosed with a concussion. She was prescribed 1,000 mg Tylenol. She still has a headache. It starts in the front and goes all the way to the back left side of her head. Not getting any better. Please advise.

## 2018-10-01 NOTE — Telephone Encounter (Signed)
Pt aware of message below and verbalized understanding. No further questions or concerns from pt at this time.

## 2018-10-01 NOTE — Telephone Encounter (Signed)
Patient's CT scan of her head from 09/25/2018 was normal.  She can continue the Tylenol as needed.  Encourage her to try to "rest her brain" by avoiding TV, computers, reading, and outside stressors.  Thank you

## 2018-10-08 ENCOUNTER — Inpatient Hospital Stay: Admit: 2018-10-08 | Payer: MEDICARE | Primary: Nurse Practitioner

## 2018-10-08 DIAGNOSIS — Z01812 Encounter for preprocedural laboratory examination: Secondary | ICD-10-CM

## 2018-10-09 LAB — COVID-19: SARS-CoV-2: NOT DETECTED

## 2018-10-09 LAB — NOVEL CORONAVIRUS (COVID-19): SARS-CoV-2: NOT DETECTED

## 2018-10-12 ENCOUNTER — Inpatient Hospital Stay: Payer: MEDICARE

## 2018-10-12 LAB — HCG URINE, QL. - POC
HCG, Pregnancy, Urine, POC: NEGATIVE
Pregnancy test,urine (POC): NEGATIVE

## 2018-10-12 MED ORDER — PROPOFOL 10 MG/ML IV EMUL
10 mg/mL | INTRAVENOUS | Status: AC
Start: 2018-10-12 — End: ?

## 2018-10-12 MED ORDER — LACTATED RINGERS IV
INTRAVENOUS | Status: DC
Start: 2018-10-12 — End: 2018-10-12

## 2018-10-12 MED ORDER — LACTATED RINGERS IV
INTRAVENOUS | Status: DC
Start: 2018-10-12 — End: 2018-10-12
  Administered 2018-10-12: 12:00:00 via INTRAVENOUS

## 2018-10-12 MED ORDER — PROPOFOL 10 MG/ML IV EMUL
10 mg/mL | INTRAVENOUS | Status: DC | PRN
Start: 2018-10-12 — End: 2018-10-12
  Administered 2018-10-12 (×8): via INTRAVENOUS

## 2018-10-12 MED ORDER — SODIUM CHLORIDE 0.9 % IJ SYRG
Freq: Three times a day (TID) | INTRAMUSCULAR | Status: DC
Start: 2018-10-12 — End: 2018-10-12

## 2018-10-12 MED ORDER — SODIUM CHLORIDE 0.9 % IJ SYRG
INTRAMUSCULAR | Status: DC | PRN
Start: 2018-10-12 — End: 2018-10-12

## 2018-10-12 MED ORDER — LIDOCAINE (PF) 20 MG/ML (2 %) IJ SOLN
20 mg/mL (2 %) | INTRAMUSCULAR | Status: DC | PRN
Start: 2018-10-12 — End: 2018-10-12
  Administered 2018-10-12: 12:00:00 via INTRAVENOUS

## 2018-10-12 MED ORDER — LIDOCAINE (PF) 10 MG/ML (1 %) IJ SOLN
10 mg/mL (1 %) | INTRAMUSCULAR | Status: DC | PRN
Start: 2018-10-12 — End: 2018-10-12

## 2018-10-12 MED ORDER — FAMOTIDINE 20 MG TAB
20 mg | Freq: Once | ORAL | Status: DC
Start: 2018-10-12 — End: 2018-10-12

## 2018-10-12 MED ORDER — FAMOTIDINE 20 MG TAB
20 mg | Freq: Once | ORAL | Status: AC
Start: 2018-10-12 — End: 2018-10-12
  Administered 2018-10-12: 12:00:00 via ORAL

## 2018-10-12 MED ORDER — LIDOCAINE (PF) 20 MG/ML (2 %) IJ SOLN
20 mg/mL (2 %) | INTRAMUSCULAR | Status: AC
Start: 2018-10-12 — End: ?

## 2018-10-12 MED FILL — DIPRIVAN 10 MG/ML INTRAVENOUS EMULSION: 10 mg/mL | INTRAVENOUS | Qty: 20

## 2018-10-12 MED FILL — BD POSIFLUSH NORMAL SALINE 0.9 % INJECTION SYRINGE: INTRAMUSCULAR | Qty: 40

## 2018-10-12 MED FILL — LACTATED RINGERS IV: INTRAVENOUS | Qty: 1000

## 2018-10-12 MED FILL — XYLOCAINE-MPF 20 MG/ML (2 %) INJECTION SOLUTION: 20 mg/mL (2 %) | INTRAMUSCULAR | Qty: 5

## 2018-10-12 MED FILL — FAMOTIDINE 20 MG TAB: 20 mg | ORAL | Qty: 1

## 2018-10-12 NOTE — Anesthesia Post-Procedure Evaluation (Signed)
Procedure(s):  COLONOSCOPY.    MAC    Anesthesia Post Evaluation      Multimodal analgesia: multimodal analgesia used between 6 hours prior to anesthesia start to PACU discharge  Patient location during evaluation: PACU  Patient participation: complete - patient participated  Level of consciousness: awake  Pain management: adequate  Airway patency: patent  Anesthetic complications: no  Cardiovascular status: acceptable  Respiratory status: acceptable  Hydration status: acceptable  Post anesthesia nausea and vomiting:  controlled      INITIAL Post-op Vital signs:   Vitals Value Taken Time   BP 128/75 10/12/2018  8:38 AM   Temp 36.3 ??C (97.4 ??F) 10/12/2018  8:19 AM   Pulse 77 10/12/2018  8:39 AM   Resp 20 10/12/2018  8:39 AM   SpO2 100 % 10/12/2018  8:38 AM   Vitals shown include unvalidated device data.

## 2018-10-12 NOTE — Anesthesia Pre-Procedure Evaluation (Signed)
Relevant Problems   No relevant active problems       Anesthetic History     PONV          Review of Systems / Medical History  Patient summary reviewed and pertinent labs reviewed    Pulmonary  Within defined limits                 Neuro/Psych   Within defined limits           Cardiovascular  Within defined limits                Exercise tolerance: >4 METS     GI/Hepatic/Renal  Within defined limits              Endo/Other        Morbid obesity and arthritis     Other Findings              Physical Exam    Airway  Mallampati: II  TM Distance: 4 - 6 cm  Neck ROM: normal range of motion   Mouth opening: Normal     Cardiovascular    Rhythm: regular  Rate: normal         Dental  No notable dental hx       Pulmonary                Comments: Non labored Abdominal  GI exam deferred       Other Findings            Anesthetic Plan    ASA: 3  Anesthesia type: MAC            Anesthetic plan and risks discussed with: Patient

## 2018-10-12 NOTE — Progress Notes (Signed)
[email or web address] [address]      Brief Procedure Note    Tiffany [name]  20-May-1970  [id number]    Date of Procedure: 10/12/2018    Preoperative diagnosis: Constipation, chronic [K59.09]    Postoperative diagnosis: adhesions  Normal coloscopy    Type of Anesthesia: MAC (Monitored anesthesia care)    Description of findings: same as post op dx    Procedure: Procedure(s):  COLONOSCOPY    Operator:  Dr. Alferd Patee, MD    Assistant(s): Endoscopy RN-1: Heloise Beecham, RN  Float Staff: Dolores Lory    Devices/implants/grafts/tissues/prosthesis: None    XBJ:YNWG    Specimens: * No specimens in log *    Findings: See printed and scanned procedure note    Complications: None    Dr. Alferd Patee, MD  10/12/2018  8:19 AM

## 2018-10-12 NOTE — H&P (Signed)
[email or web address]  [phone]    GASTROENTEROLOGY Pre-Procedure H and P      Impression/Plan:   1. This patient is consented for a colonoscopy for LLQ pain    Addendum: All lab tests orders pertaining to the procedure have been ordered by the anesthesia personnel and results will be addressed by the anesthesia team    Chief Complaint: LLQ pain      HPI:  Tiffany [name] is a 48 y.o. female who is being is having a colonoscopy for LLQ pain  PMH:   Past Medical History:   Diagnosis Date   ??? Arthritis    ??? Chronic constipation 07/25/2018    Dr Donovan Kail   ??? 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 Chronic pain of right knee 07/25/2018     Dr Vinson Moselle, ortho   ??? Constipation due to opioid therapy 07/25/2018   ??? DVT (deep venous thrombosis) ([place]) 2017    Left groin; Dr Sallee Provencal, vascular, places IVF preop   ??? Dysphagia     Dr Luster Landsberg   ??? Esophageal obstruction     Dr Luster Landsberg   ??? Fall 05/2018    see brief documentation on 09/05/2018   ??? History of fall 09/05/2018   ??? Insomnia 07/25/2018   ??? Morbid obesity ([place]) 03/11/2016   ??? Spasm of back muscles 07/25/2018   ??? 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 Vomiting     episodic       PSH:   Past Surgical History:   Procedure Laterality Date   ??? ABDOMEN SURGERY PROC UNLISTED  2017    Open abdomen management (14 days) after vascular injury during lumbar fusion   ??? COLONOSCOPY N/A 10/12/2017    COLONOSCOPY performed by Alferd Patee, MD at [facility] ENDOSCOPY   ??? HX ADENOIDECTOMY     ??? HX CHOLECYSTECTOMY  2013   ??? HX COLONOSCOPY  1999    screening - neg results per patient   ??? HX ENDOSCOPY      screening - neg results per patient   ??? HX GYN      ectopic preg with tube rupture   ??? HX KNEE REPLACEMENT Right 12/2017     Dr Vinson Moselle, ortho   ??? HX LUMBAR FUSION  0000000    [id number] - complicated by vascular injury   ??? HX ORTHOPAEDIC      left foot surgery - stress fx   ??? HX ORTHOPAEDIC      L &R carpal tunnel    ??? HX TONSILLECTOMY     ??? IR IVC FILTER  2017    removed June 2017 noted in care everywhere   ??? VASCULAR SURGERY PROCEDURE UNLIST      vascular repair of aortic/venocaval repair.        Social HX:   Social History     Socioeconomic History   ??? Marital status: MARRIED     Spouse name: Not on file   ??? Number of children: Not on file   ??? Years of education: Not on file   ??? Highest education level: Not on file   Occupational History   ??? Not on file   Social Needs   ??? Financial resource strain: Not on file   ??? Food insecurity     Worry: Not on file     Inability: Not on file   ??? Transportation needs  Medical: Not on file     Non-medical: Not on file   Tobacco Use   ??? Smoking status: Never Smoker   ??? Smokeless tobacco: Never Used   Substance and Sexual Activity   ??? Alcohol use: No   ??? Drug use: No   ??? Sexual activity: Yes     Partners: Male   Lifestyle   ??? Physical activity     Days per week: Not on file     Minutes per session: Not on file   ??? Stress: Not on file   Relationships   ??? Social Product manager on phone: Not on file     Gets together: Not on file     Attends religious service: Not on file     Active member of club or organization: Not on file     Attends meetings of clubs or organizations: Not on file     Relationship status: Not on file   ??? Intimate partner violence     Fear of current or ex partner: Not on file     Emotionally abused: Not on file     Physically abused: Not on file     Forced sexual activity: Not on file   Other Topics Concern   ??? Not on file   Social History Narrative   ??? Not on file       FHX:   Family History   Problem Relation Age of Onset   ??? Hypertension Mother    ??? Heart Disease Father    ??? Hypertension Father        Allergy:   Allergies   Allergen Reactions   ??? Darvocet A500 [Propoxyphene N-Acetaminophen] Hives       Home Medications:     Medications Prior to Admission   Medication Sig   ??? bisacodyL (Dulcolax, bisacodyl,) 5 mg EC tablet Take 5 mg by mouth daily  as needed for Constipation.   ??? acetaminophen-codeine (Tylenol-Codeine #4) 300-60 mg tab Take 1 Tab by mouth every eight (8) hours as needed.   ??? traZODone (DESYREL) 50 mg tablet Take 1 Tab by mouth nightly. insomnia   ??? mirabegron ER (MYRBETRIQ) 25 mg ER tablet Take 1 Tab by mouth daily. Indications: a condition where the urge to urinate results in urine leakage   ??? cyclobenzaprine (FLEXERIL) 10 mg tablet Take 1 Tab by mouth two (2) times daily as needed for Muscle Spasm(s).   ??? DULoxetine (CYMBALTA) 30 mg capsule Take 1 Cap by mouth daily. For nerve pain.   ??? ferrous sulfate (IRON) 325 mg (65 mg iron) tablet Take  by mouth Daily (before breakfast).   ??? topiramate (TOPAMAX) 50 mg tablet Take  by mouth two (2) times a day.       Review of Systems:     Constitutional: No fevers, chills, weight loss, fatigue.   Skin: No rashes, pruritis, jaundice, ulcerations, erythema.   HENT: No headaches, nosebleeds, sinus pressure, rhinorrhea, sore throat.   Eyes: No visual changes, blurred vision, eye pain, photophobia, jaundice.   Cardiovascular: No chest pain, heart palpitations.   Respiratory: No cough, SOB, wheezing, chest discomfort, orthopnea.   Gastrointestinal:    Genitourinary: No dysuria, bleeding, discharge, pyuria.   Musculoskeletal: No weakness, arthralgias, wasting.   Endo: No sweats.   Heme: No bruising, easy bleeding.   Allergies: As noted.   Neurological: Cranial nerves intact.  Alert and oriented. Gait not assessed.   Psychiatric:  No anxiety, depression,  hallucinations.          Visit Vitals  BP 129/83   Pulse 76   Temp 98 ??F (36.7 ??C)   Resp 20   Ht 5\' 5"  (1.651 m)   Wt 154.2 kg (340 lb)   LMP 07/12/2018   SpO2 98%   BMI 56.58 kg/m??       Physical Assessment:     constitutional: appearance: well developed, well nourished, normal habitus, no deformities, in no acute distress.   skin: inspection: no rashes, ulcers, icterus or other lesions; no clubbing  or telangiectasias. palpation: no induration or subcutaneos nodules.   eyes: inspection: normal conjunctivae and lids; no jaundice pupils: normal  ENMT: mouth: normal oral mucosa,lips and gums; good dentition. oropharynx: normal tongue, hard and soft palate; posterior pharynx without erithema, exudate or lesions.   neck: thyroid: normal size, consistency and position; no masses or tenderness.   respiratory: effort: normal chest excursion; no intercostal retraction or accessory muscle use.   cardiovascular: abdominal aorta: normal size and position; no bruits. palpation: PMI of normal size and position; normal rhythm; no thrill or murmurs.   abdominal: abdomen: normal consistency; no tenderness or masses. hernias: no hernias appreciated. liver: normal size and consistency. spleen: not palpable.   rectal: hemoccult/guaiac: not performed.   musculoskeletal: digits and nails: no clubbing, cyanosis, petechiae or other inflammatory conditions. gait: normal gait and station head and neck: normal range of motion; no pain, crepitation or contracture. spine/ribs/pelvis: normal range of motion; no pain, deformity or contracture.   neurologic: cranial nerves: II-XII normal.   psychiatric: judgement/insight: within normal limits. memory: within normal limits for recent and remote events. mood and affect: no evidence of depression, anxiety or agitation. orientation: oriented to time, space and person.        Basic Metabolic Profile   No results for input(s): NA, K, CL, CO2, BUN, GLU, CA, MG, PHOS in the last 72 hours.    No lab exists for component: CREAT      CBC w/Diff    No results for input(s): WBC, RBC, HGB, HCT, MCV, MCH, MCHC, RDW, PLT, HGBEXT, HCTEXT, PLTEXT in the last 72 hours.    No lab exists for component: MPV No results for input(s): GRANS, LYMPH, EOS, PRO, MYELO, METAS, BLAST in the last 72 hours.    No lab exists for component: MONO, BASO     Hepatic Function    No results for input(s): ALB, TP, TBILI, AP, AML, LPSE in the last 72 hours.    No lab exists for component: DBILI, GPT, SGOT     Coags   No results for input(s): PTP, INR, APTT, INREXT in the last 72 hours.        Alferd Patee, MD.  [facility] [place], [place]  Cell: [phone]  Direct pager: [phone]  [email or web address]  [email or web address]

## 2018-10-12 NOTE — Progress Notes (Signed)
Progress  Notes by Alferd Patee, MD at 10/12/18 0093                Author: Alferd Patee, MD  Service: Gastroenterology  Author Type: Physician       Filed: 10/12/18 0819  Date of Service: 10/12/18 0818  Status: Signed          Editor: Alferd Patee, MD (Physician)                             [email or web address] [address]         Brief Procedure Note      Tiffany [name]   10-12-1970   [id number]      Date of Procedure: 10/12/2018      Preoperative diagnosis: Constipation, chronic [K59.09]      Postoperative diagnosis: adhesions   Normal coloscopy      Type of Anesthesia: MAC (Monitored anesthesia care)      Description of findings: same as post op dx      Procedure: Procedure(s):   COLONOSCOPY      Operator:  Dr. Alferd Patee, MD      Assistant(s): Endoscopy RN-1: Heloise Beecham, RN   Float Staff: Dolores Lory      Devices/implants/grafts/tissues/prosthesis: None      LFY:BOFB      Specimens: * No specimens in log *      Findings: See printed and scanned procedure note      Complications: None      Dr. Alferd Patee, MD   10/12/2018  8:19 AM

## 2018-10-12 NOTE — Anesthesia Pre-Procedure Evaluation (Signed)
Relevant Problems   No relevant active problems       Anesthetic History     PONV          Review of Systems / Medical History  Patient summary reviewed and pertinent labs reviewed    Pulmonary  Within defined limits                 Neuro/Psych   Within defined limits           Cardiovascular  Within defined limits                Exercise tolerance: >4 METS     GI/Hepatic/Renal  Within defined limits              Endo/Other        Morbid obesity and arthritis     Other Findings              Physical Exam    Airway  Mallampati: II  TM Distance: 4 - 6 cm  Neck ROM: normal range of motion   Mouth opening: Normal     Cardiovascular    Rhythm: regular  Rate: normal         Dental  No notable dental hx       Pulmonary                Comments: Non labored Abdominal  GI exam deferred       Other Findings            Anesthetic Plan    ASA: 3  Anesthesia type: MAC            Anesthetic plan and risks discussed with: Patient

## 2018-10-12 NOTE — Anesthesia Post-Procedure Evaluation (Signed)
Procedure(s):  COLONOSCOPY.    MAC    Anesthesia Post Evaluation      Multimodal analgesia: multimodal analgesia used between 6 hours prior to anesthesia start to PACU discharge  Patient location during evaluation: PACU  Patient participation: complete - patient participated  Level of consciousness: awake  Pain management: adequate  Airway patency: patent  Anesthetic complications: no  Cardiovascular status: acceptable  Respiratory status: acceptable  Hydration status: acceptable  Post anesthesia nausea and vomiting:  controlled      INITIAL Post-op Vital signs:   Vitals Value Taken Time   BP 128/75 10/12/2018  8:38 AM   Temp 36.3 ??C (97.4 ??F) 10/12/2018  8:19 AM   Pulse 77 10/12/2018  8:39 AM   Resp 20 10/12/2018  8:39 AM   SpO2 100 % 10/12/2018  8:38 AM   Vitals shown include unvalidated device data.

## 2018-10-12 NOTE — H&P (Signed)
H&P by Alferd Patee, MD at  10/12/18 [id number]                Author: Alferd Patee, MD  Service: Gastroenterology  Author Type: Physician       Filed: 10/12/18 0735  Date of Service: 10/12/18 0734  Status: Signed          Editor: Alferd Patee, MD (Physician)                    [email or web address]   [phone]      GASTROENTEROLOGY Pre-Procedure H and P           Impression/Plan:     1. This patient is consented for a colonoscopy for LLQ pain      Addendum: All lab tests orders pertaining to the procedure have been ordered by the anesthesia personnel and results will be addressed by the anesthesia team      Chief Complaint: LLQ pain         HPI:   Tiffany [name] is a 48 y.o.  female who is being is having a colonoscopy for LLQ pain   PMH:      Past Medical History:        Diagnosis  Date         ?  Arthritis       ?  Chronic constipation  07/25/2018          Dr Donovan Kail         ?  Chronic low back pain with sciatica  07/25/2018          Dr Berna Spare mgt         ?  Chronic pain of right knee  07/25/2018           Dr Vinson Moselle, ortho         ?  Constipation due to opioid therapy  07/25/2018     ?  DVT (deep venous thrombosis) ([place])  2017          Left groin; Dr Sallee Provencal, vascular, places IVF preop         ?  Dysphagia            Dr Luster Landsberg         ?  Esophageal obstruction            Dr Donovan Kail [facility]         ?  Fall  05/2018          see brief documentation on 09/05/2018         ?  History of fall  09/05/2018     ?  Insomnia  07/25/2018     ?  Morbid obesity ([place])  03/11/2016     ?  Spasm of back muscles  07/25/2018     ?  Status post right knee replacement  07/25/2018           Dr Vinson Moselle, ortho         ?  Vomiting            episodic           PSH:      Past Surgical History:         Procedure  Laterality  Date          ?  ABDOMEN SURGERY PROC UNLISTED    2017          Open abdomen management (14 days) after vascular injury during  lumbar fusion          ?  COLONOSCOPY  N/A  10/12/2017          COLONOSCOPY performed by  Alferd Patee, MD at [facility] ENDOSCOPY          ?  HX ADENOIDECTOMY         ?  HX CHOLECYSTECTOMY    2013     ?  HX COLONOSCOPY    1999          screening - neg results per patient          ?  HX ENDOSCOPY              screening - neg results per patient          ?  HX GYN              ectopic preg with tube rupture          ?  HX KNEE REPLACEMENT  Right  12/2017           Dr Vinson Moselle, ortho          ?  HX LUMBAR FUSION    2017          [id number] - complicated by vascular injury          ?  HX ORTHOPAEDIC              left foot surgery - stress fx          ?  HX ORTHOPAEDIC              L &R carpal tunnel          ?  HX TONSILLECTOMY         ?  IR IVC FILTER    2017          removed June 2017 noted in care everywhere          ?  VASCULAR SURGERY PROCEDURE UNLIST              vascular repair of aortic/venocaval repair.            Social HX:      Social History          Socioeconomic History         ?  Marital status:  MARRIED              Spouse name:  Not on file         ?  Number of children:  Not on file     ?  Years of education:  Not on file     ?  Highest education level:  Not on file       Occupational History        ?  Not on file       Social Needs         ?  Financial resource strain:  Not on file        ?  Food insecurity              Worry:  Not on file         Inability:  Not on file        ?  Transportation needs              Medical:  Not on file         Non-medical:  Not on file  Tobacco Use         ?  Smoking status:  Never Smoker     ?  Smokeless tobacco:  Never Used       Substance and Sexual Activity         ?  Alcohol use:  No     ?  Drug use:  No     ?  Sexual activity:  Yes              Partners:  Male       Lifestyle        ?  Physical activity              Days per week:  Not on file         Minutes per session:  Not on file         ?  Stress:  Not on file       Relationships        ?  Social Health visitor on phone:  Not on file         Gets together:  Not on file          Attends religious service:  Not on file         Active member of club or organization:  Not on file         Attends meetings of clubs or organizations:  Not on file         Relationship status:  Not on file        ?  Intimate partner violence              Fear of current or ex partner:  Not on file         Emotionally abused:  Not on file         Physically abused:  Not on file         Forced sexual activity:  Not on file        Other Topics  Concern        ?  Not on file       Social History Narrative        ?  Not on file           FHX:      Family History         Problem  Relation  Age of Onset          ?  Hypertension  Mother       ?  Heart Disease  Father            ?  Hypertension  Father             Allergy:      Allergies        Allergen  Reactions         ?  Darvocet A500 [Propoxyphene N-Acetaminophen]  Hives             Home Medications:          Medications Prior to Admission        Medication  Sig         ?  bisacodyL (Dulcolax, bisacodyl,) 5 mg EC tablet  Take 5 mg by mouth daily as needed for Constipation.     ?  acetaminophen-codeine (Tylenol-Codeine #4) 300-60 mg tab  Take 1 Tab by mouth every  eight (8) hours as needed.     ?  traZODone (DESYREL) 50 mg tablet  Take 1 Tab by mouth nightly. insomnia     ?  mirabegron ER (MYRBETRIQ) 25 mg ER tablet  Take 1 Tab by mouth daily. Indications: a condition where the urge to urinate results in urine leakage     ?  cyclobenzaprine (FLEXERIL) 10 mg tablet  Take 1 Tab by mouth two (2) times daily as needed for Muscle Spasm(s).         ?  DULoxetine (CYMBALTA) 30 mg capsule  Take 1 Cap by mouth daily. For nerve pain.         ?  ferrous sulfate (IRON) 325 mg (65 mg iron) tablet  Take  by mouth Daily (before breakfast).         ?  topiramate (TOPAMAX) 50 mg tablet  Take  by mouth two (2) times a day.             Review of Systems:           Constitutional:  No fevers, chills, weight loss, fatigue.        Skin:  No rashes, pruritis, jaundice, ulcerations, erythema.      HENT:  No headaches, nosebleeds, sinus pressure, rhinorrhea, sore throat.     Eyes:  No visual changes, blurred vision, eye pain, photophobia, jaundice.     Cardiovascular:  No chest pain, heart palpitations.        Respiratory:  No cough, SOB, wheezing, chest discomfort, orthopnea.        Gastrointestinal:          Genitourinary:  No dysuria, bleeding, discharge, pyuria.        Musculoskeletal:  No weakness, arthralgias, wasting.     Endo:  No sweats.     Heme:  No bruising, easy bleeding.        Allergies:  As noted.        Neurological:  Cranial nerves intact.  Alert and oriented. Gait not assessed.        Psychiatric:   No anxiety, depression, hallucinations.               Visit Vitals      BP  129/83     Pulse  76     Temp  98 ??F (36.7 ??C)     Resp  20     Ht  5\' 5"  (1.651 m)     Wt  154.2 kg (340 lb)     LMP  07/12/2018     SpO2  98%        BMI  56.58 kg/m??             Physical Assessment:        constitutional: appearance: well developed, well nourished, normal habitus, no deformities,  in no acute distress.   skin: inspection:  no rashes, ulcers, icterus or other lesions; no clubbing or telangiectasias. palpation: no induration or subcutaneos nodules.    eyes: inspection: normal conjunctivae and lids; no jaundice pupils:  normal   ENMT: mouth: normal oral mucosa,lips  and gums; good dentition. oropharynx: normal tongue, hard and soft palate; posterior pharynx without erithema, exudate or lesions.    neck: thyroid: normal size, consistency and position; no masses or tenderness.    respiratory: effort: normal chest excursion; no intercostal retraction or accessory muscle  use.   cardiovascular: abdominal aorta:  normal size and position; no bruits. palpation: PMI of normal  size and position; normal rhythm; no thrill or murmurs.    abdominal: abdomen: normal consistency; no tenderness or masses. hernias:  no hernias appreciated. liver: normal size and consistency. spleen:  not palpable.   rectal:  hemoccult/guaiac: not performed.    musculoskeletal: digits and nails: no clubbing, cyanosis, petechiae or other inflammatory conditions.  gait: normal gait and station head and neck: normal range of motion; no pain, crepitation  or contracture. spine/ribs/pelvis: normal range of motion; no pain, deformity or contracture.    neurologic: cranial nerves: II-XII normal.    psychiatric: judgement/insight: within normal limits. memory:  within normal limits for recent and remote events. mood and affect: no evidence of depression, anxiety or agitation.  orientation: oriented to time, space and person.            Basic Metabolic Profile       No results for input(s): NA, K, CL, CO2, BUN, GLU, CA, MG, PHOS in the last 72 hours.      No lab exists for component: CREAT            CBC w/Diff          No results for input(s): WBC, RBC, HGB, HCT, MCV, MCH, MCHC, RDW, PLT, HGBEXT, HCTEXT, PLTEXT in the last 72 hours.      No lab exists for component: MPV  No results for input(s): GRANS, LYMPH, EOS, PRO, MYELO, METAS, BLAST in the last 72 hours.      No lab exists for component: MONO, BASO          Hepatic Function       No results for input(s): ALB, TP, TBILI, AP, AML, LPSE in the last 72 hours.      No lab exists for component: DBILI, GPT, SGOT          Coags       No results for input(s): PTP, INR, APTT, INREXT in the last 72 hours.            Alferd Patee, MD.   [facility] [place], [place]   Cell: [phone]   Direct pager: [phone]   [email or web address]   [email or web address]

## 2018-10-16 ENCOUNTER — Encounter

## 2018-10-17 NOTE — Telephone Encounter (Signed)
Pt is taking Topamax for chronic back pain. She will need to get refill from Dr Nat Christen, pain mgt. Thank you

## 2018-10-17 NOTE — Telephone Encounter (Signed)
This med is listed as historical. Please sign if appropriate.    Last Visit: 09/05/18 with NP [facility]  Next Appointment: 11/23/18 with NP [facility]    Requested Prescriptions     Pending Prescriptions Disp Refills   ??? topiramate (Topamax) 50 mg tablet 60 Tab 1     Sig: Take 1 Tab by mouth two (2) times a day.

## 2018-10-23 LAB — HM MAMMOGRAPHY

## 2018-10-23 MED ORDER — TOPIRAMATE 50 MG TAB
50 mg | ORAL_TABLET | Freq: Two times a day (BID) | ORAL | 0 refills | Status: DC
Start: 2018-10-23 — End: 2018-11-25

## 2018-10-23 NOTE — Telephone Encounter (Signed)
Patient stating she contacted pain management and they are tell her that her PCP is the one who needs to be filling for her.

## 2018-10-23 NOTE — Addendum Note (Signed)
Addended by: Alla Feeling on: 10/23/2018 03:49 PM     Modules accepted: Orders

## 2018-10-23 NOTE — Telephone Encounter (Signed)
I will E scribe Topamax 50 mg twice daily to her pharmacy.  Patient was supposed to have made a 7-month appointment to follow-up with me back in June.  I have only given her 30 days to hold her until she sees me in October.  Please schedule her an appointment prior to October 15th and inform her she will need to have her lipid level drawn 1 week prior to that date.  This order is already in.  Thank you

## 2018-10-23 NOTE — Telephone Encounter (Signed)
LMOM for patient to return call.

## 2018-10-23 NOTE — Addendum Note (Signed)
Addendum Note by Alla Feeling, DNP at 10/23/18 1549                Author: Alla Feeling, DNP  Service: --  Author Type: Nurse Practitioner       Filed: 10/23/18 1549  Encounter Date: 10/16/2018  Status: Signed          Editor: Alla Feeling, DNP (Nurse Practitioner)          Addended by: Alla Feeling on: 10/23/2018 03:49 PM    Modules accepted: Orders

## 2018-11-16 ENCOUNTER — Encounter: Primary: Nurse Practitioner

## 2018-11-19 NOTE — Telephone Encounter (Signed)
Error

## 2018-11-22 ENCOUNTER — Inpatient Hospital Stay: Admit: 2018-11-22 | Payer: MEDICARE | Primary: Nurse Practitioner

## 2018-11-22 DIAGNOSIS — Z13220 Encounter for screening for lipoid disorders: Secondary | ICD-10-CM

## 2018-11-22 LAB — LIPID PANEL
CHOL/HDL Ratio: 2.6 (ref 0–5.0)
Chol/HDL Ratio: 2.6 (ref 0–5.0)
Cholesterol, Total: 138 MG/DL (ref <200)
Cholesterol, total: 138 MG/DL (ref <200)
HDL Cholesterol: 53 MG/DL (ref 40–60)
HDL: 53 MG/DL (ref 40–60)
LDL Calculated: 67.4 MG/DL (ref 0–100)
LDL, calculated: 67.4 MG/DL (ref 0–100)
Triglyceride: 88 MG/DL (ref <150)
Triglycerides: 88 MG/DL (ref <150)
VLDL Cholesterol Calculated: 17.6 MG/DL
VLDL, calculated: 17.6 MG/DL

## 2018-11-22 NOTE — Progress Notes (Signed)
Lipid good

## 2018-11-23 ENCOUNTER — Telehealth
Admit: 2018-11-23 | Discharge: 2018-11-23 | Payer: MEDICARE | Attending: Nurse Practitioner | Primary: Nurse Practitioner

## 2018-11-23 ENCOUNTER — Telehealth: Attending: Nurse Practitioner | Primary: Nurse Practitioner

## 2018-11-23 DIAGNOSIS — G4700 Insomnia, unspecified: Secondary | ICD-10-CM

## 2018-11-23 NOTE — Progress Notes (Signed)
Lipid good

## 2018-11-23 NOTE — Progress Notes (Signed)
[facility]  [place], [place]  [phone] fax    11/23/2018    HPI:   Patient is a 48 year old pleasant black female with medical history documented below.  She is currently going to pain management under Dr.Weinke, for her chronic back pain.  She is no longer taking Topamax as this is not needed for her back pain.  She is currently taking tyl #4.  She was trialed with a back stimulator but this was removed due to insurance wouldn't cover it.  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  She saw Dr. I year, GI, for left upper quad pain.  She had a colonoscopy which revealed a hernia and scar tissue.  Patient continues to use trazodone for her insomnia which she states is very effective.  She plans to start the gym in the near future and hopes to lose weight.    Current medication: Dulcolax 5 mg daily as needed, Tylenol No. 4 every 8 hours as needed, Flexeril 10 mg twice daily as needed, Myrbetriq 25 mg daily, trazodone 50 mg every bedtime as needed.        Past Medical History:   Diagnosis Date   ??? Arthritis    ??? Chronic constipation 07/25/2018    Dr Donovan Kail   ??? 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 Chronic pain of right knee 07/25/2018     Dr Vinson Moselle, ortho   ??? Constipation due to opioid therapy 07/25/2018   ??? DVT (deep venous thrombosis) ([place]) 2017    Left groin; Dr Sallee Provencal, vascular, places IVF preop   ??? Dysphagia     Dr Luster Landsberg   ??? Esophageal obstruction     Dr Luster Landsberg   ??? History of fall 09/05/2018    see brief documentation on 09/05/2018   ??? Insomnia 07/25/2018   ??? Morbid obesity ([place]) 03/11/2016   ??? Spasm of back muscles 07/25/2018   ??? 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 Vomiting     episodic     Past Surgical History:   Procedure Laterality Date   ??? ABDOMEN SURGERY PROC UNLISTED  2017     Open abdomen management (14 days) after vascular injury during lumbar fusion   ??? COLONOSCOPY N/A 10/12/2017    COLONOSCOPY performed by Alferd Patee, MD at [place]   ??? COLONOSCOPY N/A 10/12/2018    COLONOSCOPY performed by [facility]ferd Patee, MD at [facility] ENDOSCOPY   ??? HX ADENOIDECTOMY     ??? HX CHOLECYSTECTOMY  2013   ??? HX COLONOSCOPY  1999    screening - neg results per patient   ??? HX ENDOSCOPY      screening - neg results per patient   ??? HX ENDOSCOPY  10/12/2018    Dr Judie Grieve, GL[facility]   ??? HX GYN      ectopic preg with tube rupture   ??? HX KNEE REPLACEMENT Right 12/2017     Dr Vinson Moselle, ortho   ??? HX LUMBAR FUSION  0000000    [id number] - complicated by vascular injury   ??? HX ORTHOPAEDIC      left foot surgery - stress fx   ??? HX ORTHOPAEDIC      L &R carpal tunnel   ??? HX TONSILLECTOMY     ??? IR IVC FILTER  2017    removed June 2017 noted in care everywhere   ??? VASCULAR SURGERY PROCEDURE UNLIST      vascular repair of aortic/venocaval repair.      Current Outpatient Medications   Medication Sig   ??? mirabegron ER (MYRBETRIQ) 25 mg ER tablet Take 1 Tab by mouth daily. Indications: a condition where the urge to urinate results in urine leakage   ??? bisacodyL (Dulcolax, bisacodyl,) 5 mg EC tablet Take 5 mg by mouth daily as needed for Constipation.   ??? acetaminophen-codeine (Tylenol-Codeine #4) 300-60 mg tab Take 1 Tab by mouth every eight (8) hours as needed.   ??? traZODone (DESYREL) 50 mg tablet Take 1 Tab by mouth nightly. insomnia   ??? cyclobenzaprine (FLEXERIL) 10 mg tablet Take 1 Tab by mouth two (2) times daily as needed for Muscle Spasm(s).     No current facility-administered medications for this visit.      Allergies and Intolerances:   Allergies   Allergen Reactions   ??? Darvocet A500 [Propoxyphene N-Acetaminophen] Hives     Family History:   Family History   Problem Relation Age of Onset   ??? Hypertension Mother    ??? Heart Disease Father    ??? Hypertension Father      Social History:    She  reports that she has never smoked. She has never used smokeless tobacco.   Social History     Substance and Sexual Activity   Alcohol Use No     Immunization History:    There is no immunization history on file for this patient.    Review of Systems:   As above included in HPI.  Otherwise 11 point review of systems negative including constitutional, skin, HENT, eyes, respiratory, cardiovascular, gastrointestinal, genitourinary, musculoskeletal, endocrine, hematologic, allergy, and neurologic.      Physical:   Exam:   Vital Signs: (As obtained by patient/caregiver at home)  There were not vitals taken for this visit.     PHYSICAL EXAMINATION:  Unable to perform due to phone only visit      Review of Data:  Labs reviewed: yes  Lipids good    Health Maintenance:  Screenings completed:    Mammogram: Yes   PAP smear: Yes   Colorectal: Yes (2020)   Depression: Yes   DM (HbA1c/FPG): No   Hepatitis C: Not applicable   Falls: No   DEXA: Not applicable   Smoking: Never   Vitamin D: Not applicable   Medicare Wellness: Not applicable        Impression:  Patient Active Problem List   Diagnosis Code   ??? Morbid obesity ([place]) E66.01   ??? Constipation due to opioid therapy K59.03, T40.2X5A   ??? Insomnia G47.00   ??? Chronic low back pain with sciatica M54.40, G89.29       Plan:  1.  Insomnia.  No change in trazodone therapy at this time  2.  Chronic back pain.  Patient to continue to follow-up with pain management.  3.  Constipation related to opioid therapy.  Strongly encourage patient to increase her fluids and to utilize the MiraLAX and Dulcolax as needed.  4.  Urge incontinence.  Refilled Myrbetriq.  5.  Obesity. Weight management:  BMI is out of normal parameters and plan is as follows: Discussed in great detail on diet, portion control, exercise, avoiding foods high in sugar, carbs and starches, fatty/greasy foods and to eat until satisfied not til full. I have counseled this patient on  diet and exercise regimens as well.  Patient verbalized understanding.       Message sent to [facility]:  Pt had a colonoscopy 10/2018, this is noted in media but was not scanned into the HM correctly. Please forward to the proper personnel to get this corrected. Thank you        Follow up 4 months  Labs needed 1 week prior to appt: No    Her next appt needs to be face to face. Not virtual or phone.    Dr. Gaspar Bidding, AGNP-C, DNP  [facility]       Level 4:   Total time: 25 minutes spent with the patient on counseling, answering questions, and/or coordination of care.        Tiffany [name], who was evaluated through a synchronous (real-time) audio only encounter, and/or her healthcare decision maker, is aware that it is a billable service, with coverage as determined by her insurance carrier. She provided verbal consent to proceed: Yes, and patient identification was verified. It was conducted pursuant to the emergency declaration under the [place], [place] waiver authority and the [employer] and [employer] Act. A caregiver was present when appropriate. Ability to conduct physical exam was limited. I was in the office. The patient was at home.

## 2018-11-23 NOTE — Progress Notes (Deleted)
[facility]  [place], [place]  [phone] fax    11/23/2018    HPI:   ***    Past Medical History:   Diagnosis Date   ??? Arthritis    ??? Chronic constipation 07/25/2018    Dr Donovan Kail   ??? 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 Chronic pain of right knee 07/25/2018     Dr Vinson Moselle, ortho   ??? Constipation due to opioid therapy 07/25/2018   ??? DVT (deep venous thrombosis) ([place]) 2017    Left groin; Dr Sallee Provencal, vascular, places IVF preop   ??? Dysphagia     Dr Luster Landsberg   ??? Esophageal obstruction     Dr Luster Landsberg   ??? Fall 05/2018    see brief documentation on 09/05/2018   ??? History of fall 09/05/2018   ??? Insomnia 07/25/2018   ??? Morbid obesity ([place]) 03/11/2016   ??? Spasm of back muscles 07/25/2018   ??? 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 Vomiting     episodic     Past Surgical History:   Procedure Laterality Date   ??? ABDOMEN SURGERY PROC UNLISTED  2017    Open abdomen management (14 days) after vascular injury during lumbar fusion   ??? COLONOSCOPY N/A 10/12/2017    COLONOSCOPY performed by Alferd Patee, MD at [place]   ??? COLONOSCOPY N/A 10/12/2018    COLONOSCOPY performed by Alferd Patee, MD at [place]   ??? HX ADENOIDECTOMY     ??? HX CHOLECYSTECTOMY  2013   ??? HX COLONOSCOPY  1999    screening - neg results per patient   ??? HX ENDOSCOPY      screening - neg results per patient   ??? HX ENDOSCOPY  10/12/2018    Dr Judie Grieve, GLST   ??? HX GYN      ectopic preg with tube rupture   ??? HX KNEE REPLACEMENT Right 12/2017     Dr Vinson Moselle, ortho   ??? HX LUMBAR FUSION  0000000    [id number] - complicated by vascular injury   ??? HX ORTHOPAEDIC      left foot surgery - stress fx   ??? HX ORTHOPAEDIC      L &R carpal tunnel   ??? HX TONSILLECTOMY     ??? IR IVC FILTER  2017    removed June 2017 noted in care everywhere   ??? VASCULAR SURGERY PROCEDURE UNLIST      vascular repair of aortic/venocaval repair.       Current Outpatient Medications   Medication Sig   ??? topiramate (Topamax) 50 mg tablet Take 1 Tab by mouth two (2) times a day. For chronic pain   ??? bisacodyL (Dulcolax, bisacodyl,) 5 mg EC tablet Take 5 mg by mouth daily as needed for Constipation.   ??? acetaminophen-codeine (Tylenol-Codeine #4) 300-60 mg tab Take 1 Tab by mouth every eight (8) hours as needed.   ??? traZODone (DESYREL) 50 mg tablet Take 1 Tab by mouth nightly. insomnia   ??? mirabegron ER (MYRBETRIQ) 25 mg ER tablet Take 1 Tab by mouth daily. Indications: a condition where the urge to urinate results in urine leakage   ??? cyclobenzaprine (FLEXERIL) 10 mg tablet Take 1 Tab by mouth two (2) times daily as needed for Muscle Spasm(s).   ??? DULoxetine (CYMBALTA) 30  mg capsule Take 1 Cap by mouth daily. For nerve pain.   ??? ferrous sulfate (IRON) 325 mg (65 mg iron) tablet Take  by mouth Daily (before breakfast).     No current facility-administered medications for this visit.      Allergies and Intolerances:   Allergies   Allergen Reactions   ??? Darvocet A500 [Propoxyphene N-Acetaminophen] Hives     Family History:   Family History   Problem Relation Age of Onset   ??? Hypertension Mother    ??? Heart Disease Father    ??? Hypertension Father      Social History:   She  reports that she has never smoked. She has never used smokeless tobacco.   Social History     Substance and Sexual Activity   Alcohol Use No     Immunization History:    There is no immunization history on file for this patient.    Review of Systems:   As above included in HPI.  Otherwise 11 point review of systems negative including constitutional, skin, HENT, eyes, respiratory, cardiovascular, gastrointestinal, genitourinary, musculoskeletal, endocrine, hematologic, allergy, and neurologic.      Physical:   Exam:   Vital Signs: (As obtained by patient/caregiver at home)  There {WERE; WERE [id number] vitals taken for this visit.     PHYSICAL EXAMINATION:   [ INSTRUCTIONS:  "[x] " Indicates a positive item  "[] " Indicates a negative item  -- DELETE ALL ITEMS NOT EXAMINED]      Constitutional: [x]  Appears well-developed and well-nourished [x]  No apparent distress      []  Abnormal - {general (Optional):28460}    Mental status: [x]  Alert and awake  [x]  Oriented to person/place/time [x]  Able to follow commands    []  Abnormal -     Eyes:   EOM    [x]   Normal    []  Abnormal -   Sclera  [x]   Normal    []  Abnormal -          Discharge [x]   None visible   []  Abnormal - {eye exam abnormal - (Optional):28462}    HENT, Neck: [x]  Normocephalic, atraumatic  []  Abnormal - {right/left:16020}.  [x]  Mouth/Throat: Mucous membranes are moist    Pulmonary/Chest: [x]  Respiratory effort normal   [x]  No visualized signs of difficulty breathing or respiratory distress        []  Abnormal - {pulm exam obs abnormal (Optional):28459}     Musculoskeletal:   [x]  Normal gait with no signs of ataxia         [x]  Normal range of motion of neck        []  Abnormal - {extremities abnormal (Optional):28465}    Neurological:        [x]  No Facial Asymmetry (Cranial nerve 7 motor function) (limited exam due to video visit)          [x]  No gaze palsy        []  Abnormal - {neuro exam abnormal (Optional):28463} {cranial nerves abnormal (Optional):28466}        Skin:        [x]  No significant exanthematous lesions or discoloration noted on facial skin         []  Abnormal - {skin exam  abnormal (Optional):28464}           Psychiatric:       [x]  Normal Affect []  Abnormal - {mental status exam abnormal (Optional):28461}       [x]  No Hallucinations    Review of Data:  Labs reviewed: {Yes No:21500}  ***    Health Maintenance:  Screenings completed:   Mammogram: {YES/NO:18490}   PAP smear: {YES/NO:18490}   Colorectal: {YES/NO:18490}   Depression: {YES/NO:18490}   DM (HbA1c/FPG): {YES/NO:18490}   Hepatitis C: {YES/NO:18490}   Falls: {YES/NO:18490}   DEXA: {YES/NO:18490}    Glaucoma: regular eye exams with Dr. Marland Kitchen (last *** )                          {YES/NO:18490}   Smoking: {NEVER/FORMER/CURRENT:24327}   Vitamin D: {YES/NO:18490}   Medicare Wellness: {YES/NO:18490}        Impression:  Patient Active Problem List   Diagnosis Code   ??? Morbid obesity ([place]) E66.01   ??? BMI 50.0-59.9, adult ([place]) Z68.43   ??? Chronic constipation K59.09   ??? Constipation due to opioid therapy K59.03, T40.2X5A   ??? Insomnia G47.00   ??? Chronic low back pain with sciatica M54.40, G89.29   ??? Spasm of back muscles M62.830   ??? Status post right knee replacement Z96.651   ??? Chronic pain of right knee M25.561, G89.29   ??? History of fall Z91.81       Plan:  ***    Reviewed medication and completed the medication reconciliation with the patient. Reviewed side effects of medications with the patient. Questions were answered and patient verb understanding.      Follow up {NUMBERS; 1-6:10304} [place] DAYS/WEEKS/MONTHS:109061}  Labs needed 1 week prior to appt: {YES (DEF)/NO:29038}    Dr. Gaspar Bidding, AGNP-C, DNP  [facility]       Level {Numbers; 1-4 :[id number]:   ***      Tiffany [name], who was evaluated through a synchronous (real-time) audio-video encounter, and/or her healthcare decision maker, is aware that it is a billable service, with coverage as determined by her insurance carrier. She provided verbal consent to proceed: Yes, and patient identification was verified. It was conducted pursuant to the emergency declaration under the [place], [place] waiver authority and the [employer] and [employer] Act. A caregiver was present when appropriate. Ability to conduct physical exam was limited. I was in the office. The patient was at home.

## 2018-11-23 NOTE — Progress Notes (Signed)
[facility]  [place], [place]  [phone] fax    11/23/2018    HPI:   Patient is a 48 year old pleasant black female with medical history documented below.  She is currently going to pain management under Dr.Weinke, for her chronic back pain.  She is no longer taking Topamax as this is not needed for her back pain.  She is currently taking tyl #4.  She was trialed with a back stimulator but this was removed due to insurance wouldn't cover it.  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  She saw Dr. I year, GI, for left upper quad pain.  She had a colonoscopy which revealed a hernia and scar tissue.  Patient continues to use trazodone for her insomnia which she states is very effective.  She plans to start the gym in the near future and hopes to lose weight.    Current medication: Dulcolax 5 mg daily as needed, Tylenol No. 4 every 8 hours as needed, Flexeril 10 mg twice daily as needed, Myrbetriq 25 mg daily, trazodone 50 mg every bedtime as needed.        Past Medical History:   Diagnosis Date   ??? Arthritis    ??? Chronic constipation 07/25/2018    Dr Donovan Kail   ??? 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 Chronic pain of right knee 07/25/2018     Dr Vinson Moselle, ortho   ??? Constipation due to opioid therapy 07/25/2018   ??? DVT (deep venous thrombosis) ([place]) 2017    Left groin; Dr Sallee Provencal, vascular, places IVF preop   ??? Dysphagia     Dr Luster Landsberg   ??? Esophageal obstruction     Dr Luster Landsberg   ??? History of fall 09/05/2018    see brief documentation on 09/05/2018   ??? Insomnia 07/25/2018   ??? Morbid obesity ([place]) 03/11/2016   ??? Spasm of back muscles 07/25/2018   ??? 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 Vomiting     episodic     Past Surgical History:   Procedure Laterality Date   ??? ABDOMEN SURGERY PROC UNLISTED  2017    Open abdomen management (14  days) after vascular injury during lumbar fusion   ??? COLONOSCOPY N/A 10/12/2017    COLONOSCOPY performed by Alferd Patee, MD at [place]   ??? COLONOSCOPY N/A 10/12/2018    COL[facility]d [facility] ENDOSCOPY   ??? HX ADENOIDECTOMY     ??? HX CHOLECYSTECTOMY  2013   ??? HX COLONOSCOPY  1999    screening - neg results per patient   ??? HX ENDOSCOPY      screening - neg results per patient   ??? HX ENDOSCOPY  10/12/2018    Dr Judie Grieve, GLST   ??? HX GYN      ectopic preg with tube rupture   ??? HX KNEE REPLACEMENT Right 12/2017     Dr Vinson Moselle, ortho   ??? HX LUMBAR FUSION  0000000    [id number] - complicated by vascular injury   ??? HX ORTHOPAEDIC      left foot surgery - stress fx   ??? HX ORTHOPAEDIC      L &R carpal tunnel   ??? HX TONSILLECTOMY     ??? IR IVC FILTER  2017    removed June 2017 noted in care everywhere   ??? VASCULAR SURGERY PROCEDURE UNLIST      vascular repair of aortic/venocaval repair.      Current Outpatient Medications   Medication Sig   ??? mirabegron ER (MYRBETRIQ) 25 mg ER tablet Take 1 Tab by mouth daily. Indications: a condition where the urge to urinate results in urine leakage   ??? bisacodyL (Dulcolax, bisacodyl,) 5 mg EC tablet Take 5 mg by mouth daily as needed for Constipation.   ??? acetaminophen-codeine (Tylenol-Codeine #4) 300-60 mg tab Take 1 Tab by mouth every eight (8) hours as needed.   ??? traZODone (DESYREL) 50 mg tablet Take 1 Tab by mouth nightly. insomnia   ??? cyclobenzaprine (FLEXERIL) 10 mg tablet Take 1 Tab by mouth two (2) times daily as needed for Muscle Spasm(s).     No current facility-administered medications for this visit.      Allergies and Intolerances:   Allergies   Allergen Reactions   ??? Darvocet A500 [Propoxyphene N-Acetaminophen] Hives     Family History:   Family History   Problem Relation Age of Onset   ??? Hypertension Mother    ??? Heart Disease Father    ??? Hypertension Father      Social History:   She  reports that she has never smoked. She has never used smokeless tobacco.    Social History     Substance and Sexual Activity   Alcohol Use No     Immunization History:    There is no immunization history on file for this patient.    Review of Systems:   As above included in HPI.  Otherwise 11 point review of systems negative including constitutional, skin, HENT, eyes, respiratory, cardiovascular, gastrointestinal, genitourinary, musculoskeletal, endocrine, hematologic, allergy, and neurologic.      Physical:   Exam:   Vital Signs: (As obtained by patient/caregiver at home)  There were not vitals taken for this visit.     PHYSICAL EXAMINATION:  Unable to perform due to phone only visit      Review of Data:  Labs reviewed: yes  Lipids good    Health Maintenance:  Screenings completed:    Mammogram: Yes   PAP smear: Yes   Colorectal: Yes (2020)   Depression: Yes   DM (HbA1c/FPG): No   Hepatitis C: Not applicable   Falls: No   DEXA: Not applicable   Smoking: Never   Vitamin D: Not applicable   Medicare Wellness: Not applicable        Impression:  Patient Active Problem List   Diagnosis Code   ??? Morbid obesity ([place]) E66.01   ??? Constipation due to opioid therapy K59.03, T40.2X5A   ??? Insomnia G47.00   ??? Chronic low back pain with sciatica M54.40, G89.29       Plan:  1.  Insomnia.  No change in trazodone therapy at this time  2.  Chronic back pain.  Patient to continue to follow-up with pain management.  3.  Constipation related to opioid therapy.  Strongly encourage patient to increase her fluids and to utilize the MiraLAX and Dulcolax as needed.  4.  Urge incontinence.  Refilled Myrbetriq.  5.  Obesity. Weight management:  BMI is out of normal parameters and plan is as follows: Discussed in great detail on diet, portion control, exercise, avoiding foods high in sugar, carbs and starches, fatty/greasy foods and to eat until satisfied not til full. I have counseled this patient on  diet and exercise regimens as well. Patient verbalized understanding.       Message sent to [facility]:  Pt had a  colonoscopy 10/2018, this is noted in media but was not scanned into the HM correctly. Please forward to the proper personnel to get this corrected. Thank you        Follow up 4 months  Labs needed 1 week prior to appt: No    Her next appt needs to be face to face. Not virtual or phone.    Dr. Gaspar Bidding, AGNP-C, DNP  [facility]       Level 4:   Total time: 25 minutes spent with the patient on counseling, answering questions, and/or coordination of care.        [name], who was evaluated through a synchronous (real-time) audio only encounter, and/or her healthcare decision maker, is aware that it is a billable service, with coverage as determined by her insurance carrier. She provided verbal consent to proceed: Yes, and patient identification was verified. It was conducted pursuant to the emergency declaration under the [place], [place] waiver authority and the [employer] and [employer] Act. A caregiver was present when appropriate. Ability to conduct physical exam was limited. I was in the office. The patient was at home.

## 2018-11-26 MED ORDER — MIRABEGRON ER 25 MG TABLET,EXTENDED RELEASE 24 HR
25 mg | ORAL_TABLET | Freq: Every day | ORAL | 3 refills | Status: DC
Start: 2018-11-26 — End: 2019-03-11

## 2018-12-06 ENCOUNTER — Encounter

## 2018-12-07 MED ORDER — CYCLOBENZAPRINE 10 MG TAB
10 mg | ORAL_TABLET | ORAL | 0 refills | Status: DC
Start: 2018-12-07 — End: 2019-01-28

## 2018-12-07 NOTE — Telephone Encounter (Signed)
Last Visit: 11/23/18 with NP [facility]  Next Appointment: Advised to follow-up in 4 months  Previous Refill Encounter(s): 07/25/18 #60 with 1 refill    Requested Prescriptions     Pending Prescriptions Disp Refills   ??? cyclobenzaprine (FLEXERIL) 10 mg tablet [Pharmacy Med Name: CYCLOBENZAPRINE 10 MG TABLET] 60 Tab 0     Sig: TAKE ONE TABLET BY MOUTH TWICE A DAY AS NEEDED FOR MUSCLE SPASMS

## 2018-12-07 NOTE — Telephone Encounter (Signed)
Requested Prescriptions     Pending Prescriptions Disp Refills   ??? cyclobenzaprine (FLEXERIL) 10 mg tablet [Pharmacy Med Name: CYCLOBENZAPRINE 10 MG TABLET] 60 Tab 0     Sig: TAKE ONE TABLET BY MOUTH TWICE A DAY AS NEEDED FOR MUSCLE SPASMS

## 2018-12-19 NOTE — Telephone Encounter (Signed)
Pt dropped off her personal medical records notebook for your review, stated per your request.     She said you would be reviewing and copying what you needed.     Please return call when finished for her to  pick up.    Notebook placed on your desk

## 2019-01-01 NOTE — Telephone Encounter (Signed)
Pt states she has a disability form that she needs to have filled out and she needs [facility]'s help with it.  Please advise her if she needs to be scheduled for appt at [phone]

## 2019-01-02 NOTE — Telephone Encounter (Signed)
Please inform patient to take her disability paperwork to one of her specialists to have completed ie pain management or her ortho since her disability is related to her chronic pain and other ortho issues. This is most appropriate. Thank you    Also, she can obtain her medical records as I am unable to view them due to quantity. Thank you

## 2019-01-04 NOTE — Telephone Encounter (Signed)
Virtual appt scheduled

## 2019-01-04 NOTE — Telephone Encounter (Signed)
Called pt to notify her binder with medical records is at the front desk.  She is asking to speak to Tiffany [name] about forms she is supposed to have filled out after the record is reviewed.

## 2019-01-04 NOTE — Telephone Encounter (Signed)
Please schedule her for a virtual appt sometime next week. Thank you

## 2019-01-07 NOTE — Telephone Encounter (Addendum)
Called pt to inform her that her pain mgmt doctor, Dr Jorje Guild would have to complete disability paperwork.  She said she has already discussed with them and they told her that her pcp needs to complete it.  She said everything about why she is disabled is in the notebook of records she brought in.  She said she is going from doctor to doctor, her insurance has gone from [place] to Medicare and they keep sending her to different doctors, said at one time she was on life support so had to wait 3 years (?) and her doctors are pushing her aside from one doctor to the next.  She doesn't know what to do.  Please advise her

## 2019-01-08 ENCOUNTER — Telehealth
Admit: 2019-01-08 | Discharge: 2019-01-08 | Payer: MEDICARE | Attending: Nurse Practitioner | Primary: Nurse Practitioner

## 2019-01-08 ENCOUNTER — Telehealth: Attending: Nurse Practitioner | Primary: Nurse Practitioner

## 2019-01-08 DIAGNOSIS — M5440 Lumbago with sciatica, unspecified side: Secondary | ICD-10-CM

## 2019-01-08 DIAGNOSIS — G8929 Other chronic pain: Secondary | ICD-10-CM

## 2019-01-08 NOTE — Progress Notes (Signed)
[facility]  [place], [place]  [phone] fax    01/08/2019    HPI:   Tiffany [name] 1970/09/26 is a pleasant [place] female who presents virtually today to discuss the need for disability paperwork to be completed.  Patient has a long history of medical issues since 2017.  She is currently seeing Dr. Arma Heading for pain management due to chronic back issues.  Every year she comes up for review for her disability paperwork and she has requested Dr. Arma Heading complete this paperwork but she has refused to do so so now the patient is looking at her PCP to get this done.  Patient is very frustrated and teary-eyed during the visit stating no one understands what is going on and the importance of having this paperwork updated.  She states she is currently being garnished for student loans due to her paperwork not being updated.  Patient will be coming in tomorrow at 11 AM for face-to-face consult with disability paperwork in hand.    Past Medical History:   Diagnosis Date   ??? Arthritis    ??? Chronic constipation 07/25/2018    Dr Donovan Kail   ??? 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 Chronic pain of right knee 07/25/2018     Dr Vinson Moselle, ortho   ??? Constipation due to opioid therapy 07/25/2018   ??? DVT (deep venous thrombosis) ([place]) 2017    Left groin; Dr Sallee Provencal, vascular, places IVF preop   ??? Dysphagia     Dr Luster Landsberg   ??? Esophageal obstruction     Dr Luster Landsberg   ??? History of fall 09/05/2018    see brief documentation on 09/05/2018   ??? Insomnia 07/25/2018   ??? Morbid obesity ([place]) 03/11/2016   ??? Spasm of back muscles 07/25/2018   ??? 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 Vomiting     episodic     Past Surgical History:   Procedure Laterality Date   ??? ABDOMEN SURGERY PROC UNLISTED  2017    Open abdomen management (14 days) after vascular injury during lumbar fusion    ??? COLONOSCOPY N/A 10/12/2017    COLONOSCOPY performed by Alferd Patee, MD at [place]   ??? COLONOSCOPY N/A 10/12/2018    COLONOSCOPY performed by Alferd Patee, MD at [facility] ENDOSCOPY   ??? HX ADENOIDECTOMY     ??? HX CHOLECYSTECTOMY  2013   ??? HX COLONOSCOPY  1999    screening - neg results per patient   ??? HX ENDOSCOPY      screening - neg results per patient   ??? HX ENDOSCOPY  10/12/2018    Dr Judie Grieve, [facility]   ??? HX GYN      ectopic preg with tube rupture   ??? HX KNEE REPLACEMENT Right 12/2017     Dr Vinson Moselle, ortho   ??? HX LUMBAR FUSION  0000000    [id number] - complicated by vascular injury   ??? HX ORTHOPAEDIC      left foot surgery - stress fx   ??? HX ORTHOPAEDIC      L &R carpal tunnel   ??? HX TONSILLECTOMY     ??? IR IVC FILTER  2017    removed June 2017 noted in care everywhere   ??? VASCULAR SURGERY PROCEDURE UNLIST  vascular repair of aortic/venocaval repair.      Current Outpatient Medications   Medication Sig   ??? cyclobenzaprine (FLEXERIL) 10 mg tablet TAKE ONE TABLET BY MOUTH TWICE A DAY AS NEEDED FOR MUSCLE SPASMS   ??? mirabegron ER (MYRBETRIQ) 25 mg ER tablet Take 1 Tab by mouth daily. Indications: a condition where the urge to urinate results in urine leakage   ??? bisacodyL (Dulcolax, bisacodyl,) 5 mg EC tablet Take 5 mg by mouth daily as needed for Constipation.   ??? acetaminophen-codeine (Tylenol-Codeine #4) 300-60 mg tab Take 1 Tab by mouth every eight (8) hours as needed.   ??? traZODone (DESYREL) 50 mg tablet Take 1 Tab by mouth nightly. insomnia     No current facility-administered medications for this visit.      Allergies and Intolerances:   Allergies   Allergen Reactions   ??? Darvocet A500 [Propoxyphene N-Acetaminophen] Hives     Family History:   Family History   Problem Relation Age of Onset   ??? Hypertension Mother    ??? Heart Disease Father    ??? Hypertension Father      Social History:   She  reports that she has never smoked. She has never used smokeless tobacco.   Social History     Substance and Sexual Activity    Alcohol Use No     Immunization History:    There is no immunization history on file for this patient.    Review of Systems:   As above included in HPI.  Otherwise 11 point review of systems negative including constitutional, skin, HENT, eyes, respiratory, cardiovascular, gastrointestinal, genitourinary, musculoskeletal, endocrine, hematologic, allergy, and neurologic.      Physical:   Exam:   Vital Signs: (As obtained by patient/caregiver at home)  There were not vitals taken for this visit.     PHYSICAL EXAMINATION:  [ INSTRUCTIONS:  "[x] " Indicates a positive item  "[] " Indicates a negative item  -- DELETE ALL ITEMS NOT EXAMINED]      Constitutional: [x]  Appears well-developed and well-nourished [x]  No apparent distress      []  Abnormal - morbidly obese    Mental status: [x]  Alert and awake  [x]  Oriented to person/place/time [x]  Able to follow commands    []  Abnormal -     Eyes:   EOM    [x]   Normal    []  Abnormal -   Sclera  [x]   Normal    []  Abnormal -          Discharge [x]   None visible   []  Abnormal -     HENT, Neck: [x]  Normocephalic, atraumatic  []  Abnormal - .  [x]  Mouth/Throat: Mucous membranes are moist    Pulmonary/Chest: [x]  Respiratory effort normal   [x]  No visualized signs of difficulty breathing or respiratory distress        []  Abnormal -      Musculoskeletal:   Unable to assess    Neurological:        [x]  No Facial Asymmetry (Cranial nerve 7 motor function) (limited exam due to video visit)          [x]  No gaze palsy        []  Abnormal -          Skin:        [x]  No significant exanthematous lesions or discoloration noted on facial skin         []  Abnormal -  Psychiatric:       [x]  Normal Affect []  Abnormal - teary eyed and frustrated       [x]  No Hallucinations      Impression:  Patient Active Problem List   Diagnosis Code   ??? Morbid obesity ([place]) E66.01   ??? Constipation due to opioid therapy K59.03, T40.2X5A   ??? Insomnia G47.00   ??? Chronic low back pain with sciatica M54.40, G89.29        Plan:  1.  Chronic back pain with sciatica.  Patient's concern is getting her disability paperwork completed.  She feels she is being tossed from provider to provider to have this paperwork done.  She states her pain management doctor, Dr Arma Heading, refuses to complete this paperwork and she is in desperate need to have this completed.  I have explained to this patient I have only seen her through virtual visits.  I am unsure of what paperwork she is talking about.  After much discussion (20 minutes) patient has agreed to come in tomorrow at 11 AM to discuss her needs.  I did inform the patient that I could not promise this paperwork would be completed by me as I am not sure I am the appropriate provider to complete the paperwork since she is seeing a spine specialist but I am willing to review the paperwork and discuss this with her further at tomorrow's appointment.      Dr. Gaspar Bidding, AGNP-C, DNP  [facility]       Level 3:   Total time: 20 minutes spent with the patient on counseling, answering questions, and/or coordination of care.         [name], who was evaluated through a synchronous (real-time) audio-video encounter, and/or her healthcare decision maker, is aware that it is a billable service, with coverage as determined by her insurance carrier. She provided verbal consent to proceed: Yes, and patient identification was verified. It was conducted pursuant to the emergency declaration under the [place], [place] waiver authority and the [employer] Act. A caregiver was present when appropriate. Ability to conduct physical exam was limited. I was in the office. The patient was at home.

## 2019-01-08 NOTE — Progress Notes (Signed)
[facility]  [place], [place]  [phone] fax    01/08/2019    HPI:   Tiffany [name] 01-20-1971 is a pleasant [place] female who presents virtually today to discuss the need for disability paperwork to be completed.  Patient has a long history of medical issues since 2017.  She is currently seeing Dr. Arma Heading for pain management due to chronic back issues.  Every year she comes up for review for her disability paperwork and she has requested Dr. Arma Heading complete this paperwork but she has refused to do so so now the patient is looking at her PCP to get this done.  Patient is very frustrated and teary-eyed during the visit stating no one understands what is going on and the importance of having this paperwork updated.  She states she is currently being garnished for student loans due to her paperwork not being updated.  Patient will be coming in tomorrow at 11 AM for face-to-face consult with disability paperwork in hand.    Past Medical History:   Diagnosis Date   ??? Arthritis    ??? Chronic constipation 07/25/2018    Dr Donovan Kail   ??? 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 Chronic pain of right knee 07/25/2018     Dr Vinson Moselle, ortho   ??? Constipation due to opioid therapy 07/25/2018   ??? DVT (deep venous thrombosis) ([place]) 2017    Left groin; Dr Sallee Provencal, vascular, places IVF preop   ??? Dysphagia     Dr Luster Landsberg   ??? Esophageal obstruction     Dr Luster Landsberg   ??? History of fall 09/05/2018    see brief documentation on 09/05/2018   ??? Insomnia 07/25/2018   ??? Morbid obesity ([place]) 03/11/2016   ??? Spasm of back muscles 07/25/2018   ??? 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 Vomiting     episodic     Past Surgical History:   Procedure Laterality Date   ??? ABDOMEN SURGERY PROC UNLISTED  2017    Open abdomen management (14 days) after vascular injury during lumbar fusion   ???  COLONOSCOPY N/A 10/12/2017    COLONOSCOPY performed by Alferd Patee, MD at [place]   ??? COLONOSCOPY N/A 10/12/2018    COLONOSCOPY [facility] by [facility]ferd P[facility] MD at [facility] ENDOSCOPY   ??? HX ADENOIDECTOMY     ??? HX CHOLECYSTECTOMY  2013   ??? HX COLONOSCOPY  1999    screening - neg results per patient   ??? HX ENDOSCOPY      screening - neg results per patient   ??? HX ENDOSCOPY  10/12/2018    Dr Judie Grieve, [facility]   ??? HX GYN      ectopic preg with tube rupture   ??? HX KNEE REPLACEMENT Right 12/2017     Dr Vinson Moselle, ortho   ??? HX LUMBAR FUSION  0000000    [id number] - complicated by vascular injury   ??? HX ORTHOPAEDIC      left foot surgery - stress fx   ??? HX ORTHOPAEDIC      L &R carpal tunnel   ??? HX TONSILLECTOMY     ??? IR IVC FILTER  2017    removed June 2017 noted in care everywhere   ??? VASCULAR SURGERY PROCEDURE UNLIST  vascular repair of aortic/venocaval repair.      Current Outpatient Medications   Medication Sig   ??? cyclobenzaprine (FLEXERIL) 10 mg tablet TAKE ONE TABLET BY MOUTH TWICE A DAY AS NEEDED FOR MUSCLE SPASMS   ??? mirabegron ER (MYRBETRIQ) 25 mg ER tablet Take 1 Tab by mouth daily. Indications: a condition where the urge to urinate results in urine leakage   ??? bisacodyL (Dulcolax, bisacodyl,) 5 mg EC tablet Take 5 mg by mouth daily as needed for Constipation.   ??? acetaminophen-codeine (Tylenol-Codeine #4) 300-60 mg tab Take 1 Tab by mouth every eight (8) hours as needed.   ??? traZODone (DESYREL) 50 mg tablet Take 1 Tab by mouth nightly. insomnia     No current facility-administered medications for this visit.      Allergies and Intolerances:   Allergies   Allergen Reactions   ??? Darvocet A500 [Propoxyphene N-Acetaminophen] Hives     Family History:   Family History   Problem Relation Age of Onset   ??? Hypertension Mother    ??? Heart Disease Father    ??? Hypertension Father      Social History:   She  reports that she has never smoked. She has never used smokeless tobacco.   Social History     Substance and Sexual Activity    Alcohol Use No     Immunization History:    There is no immunization history on file for this patient.    Review of Systems:   As above included in HPI.  Otherwise 11 point review of systems negative including constitutional, skin, HENT, eyes, respiratory, cardiovascular, gastrointestinal, genitourinary, musculoskeletal, endocrine, hematologic, allergy, and neurologic.      Physical:   Exam:   Vital Signs: (As obtained by patient/caregiver at home)  There were not vitals taken for this visit.     PHYSICAL EXAMINATION:  [ INSTRUCTIONS:  "[x] " Indicates a positive item  "[] " Indicates a negative item  -- DELETE ALL ITEMS NOT EXAMINED]      Constitutional: [x]  Appears well-developed and well-nourished [x]  No apparent distress      []  Abnormal - morbidly obese    Mental status: [x]  Alert and awake  [x]  Oriented to person/place/time [x]  Able to follow commands    []  Abnormal -     Eyes:   EOM    [x]   Normal    []  Abnormal -   Sclera  [x]   Normal    []  Abnormal -          Discharge [x]   None visible   []  Abnormal -     HENT, Neck: [x]  Normocephalic, atraumatic  []  Abnormal - .  [x]  Mouth/Throat: Mucous membranes are moist    Pulmonary/Chest: [x]  Respiratory effort normal   [x]  No visualized signs of difficulty breathing or respiratory distress        []  Abnormal -      Musculoskeletal:   Unable to assess    Neurological:        [x]  No Facial Asymmetry (Cranial nerve 7 motor function) (limited exam due to video visit)          [x]  No gaze palsy        []  Abnormal -          Skin:        [x]  No significant exanthematous lesions or discoloration noted on facial skin         []  Abnormal -  Psychiatric:       [x]  Normal Affect []  Abnormal - teary eyed and frustrated       [x]  No Hallucinations      Impression:  Patient Active Problem List   Diagnosis Code   ??? Morbid obesity ([place]) E66.01   ??? Constipation due to opioid therapy K59.03, T40.2X5A   ??? Insomnia G47.00   ??? Chronic low back pain with sciatica M54.40, G89.29        Plan:  1.  Chronic back pain with sciatica.  Patient's concern is getting her disability paperwork completed.  She feels she is being tossed from provider to provider to have this paperwork done.  She states her pain management doctor, Dr Arma Heading, refuses to complete this paperwork and she is in desperate need to have this completed.  I have explained to this patient I have only seen her through virtual visits.  I am unsure of what paperwork she is talking about.  After much discussion (20 minutes) patient has agreed to come in tomorrow at 11 AM to discuss her needs.  I did inform the patient that I could not promise this paperwork would be completed by me as I am not sure I am the appropriate provider to complete the paperwork since she is seeing a spine specialist but I am willing to review the paperwork and discuss this with her further at tomorrow's appointment.      Dr. Gaspar Bidding, AGNP-C, DNP  [facility]       Level 3:   Total time: 20 minutes spent with the patient on counseling, answering questions, and/or coordination of care.         [name], who was evaluated through a synchronous (real-time) audio-video encounter, and/or her healthcare decision maker, is aware that it is a billable service, with coverage as determined by her insurance carrier. She provided verbal consent to proceed: Yes, and patient identification was verified. It was conducted pursuant to the emergency declaration under the [place], [place] waiver authority and the [employer] Act. A caregiver was present when appropriate. Ability to conduct physical exam was limited. I was in the office. The patient was at home.

## 2019-01-09 ENCOUNTER — Ambulatory Visit
Admit: 2019-01-09 | Discharge: 2019-01-09 | Payer: MEDICARE | Attending: Nurse Practitioner | Primary: Nurse Practitioner

## 2019-01-09 ENCOUNTER — Ambulatory Visit: Attending: Nurse Practitioner | Primary: Nurse Practitioner

## 2019-01-09 DIAGNOSIS — M545 Low back pain, unspecified: Secondary | ICD-10-CM

## 2019-01-09 NOTE — Progress Notes (Signed)
[facility]  [place], [place]  [phone] fax    01/09/2019    HPI:   Tiffany [name] 06-18-1970 is a pleasant [place] female who presents today to discuss her disability paperwork (Discharge application: Total and Permanent Disability form) that needs to be completed by one of her medical providers. Without this paperwork, pts social security is being garnished for [employer] which she cant repay due to not being able to work. She states she has changed insurances to [facility] and now she is being bounced from provider to provider at [facility] request. She needs to see Dr. Hassell Done for follow-up of her spine issues which is why she is on disability and why she is in pain management. A stimulator was placed in her back which was very effective for her pain but her insurance would not pay for the 1 that she trialed, they would only pay for a generic form so the patient refused that therapy. She cannot return back to Dr. Hassell Done because as she owes him $400 and he will not see her without her paying the money. She last saw him 6 months ago. She also states that she falls quite frequently. She has fallen 3 times in the last 3 months. She ambulates with a standard cane. She states she loses her balance while walking and just falls. She denies any injury with the falls. She is not currently in physical therapy except for she goes to [facility] and she does aquatic therapy. She states she needs assistance getting in and out of the tub and her husband will assist her with that along with her having grab bars. She does have a stool that allows her to step on and off of her bed. She is currently in pain management under Dr.Wienke who states that she will not complete this paperwork that is needed.    Patient sees Dr. Dorthula Perfect, vascular, as needed. She states he wants to close  all 3 veins in her left lower extremity and 2 veins in her right lower extremity due to her history of blood clots. Anytime she has to have any surgical procedures an IVF must be placed.    With all of her pain and disabilities, patient is not able to get out of her house much so her social life has plummeted. She is very limited in what she can do before pain occurs in her back. She can only lift 8 to 10 pounds, she can walk 300 feet, she can stand for 35 minutes, and sit for 30 minutes before her back starts to hurt.    She is in desperate need to have this paperwork completed in order to stop the government from garnishing her wages to repay student loans.    She is extremely frustrated that she cannot find a provider to complete this paperwork.    Past Medical History:   Diagnosis Date   ??? Arthritis    ??? Chronic constipation 07/25/2018    Dr Donovan Kail   ??? 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 Chronic pain of right knee 07/25/2018     Dr Vinson Moselle, ortho   ??? Constipation due to opioid therapy 07/25/2018   ??? DVT (deep venous thrombosis) ([place]) 2017    Left groin; Dr Sallee Provencal, vascular, places IVF preop   ??? Dysphagia     Dr Luster Landsberg   ???  Esophageal obstruction     Dr Donovan Kail [facility]   ??? History of fall 09/05/2018    see brief documentation on 09/05/2018   ??? Insomnia 07/25/2018   ??? Morbid obesity ([place]) 03/11/2016   ??? Spasm of back muscles 07/25/2018   ??? 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 Vomiting     episodic     Past Surgical History:   Procedure Laterality Date   ??? ABDOMEN SURGERY PROC UNLISTED  2017    Open abdomen management (14 days) after vascular injury during lumbar fusion   ??? COLONOSCOPY N/A 10/12/2017    COLONOSCOPY performed by Alferd Patee, [place] at [place]   ??? COLONOSCOPY N/A 10/12/2018    COLONOSCOPY performed by Alferd Patee, MD at [facility] ENDOSCOPY   ??? HX ADENOIDECTOMY     ??? HX CHOLECYSTECTOMY  2013   ??? HX COLONOSCOPY  1999     screening - neg results per patient   ??? HX ENDOSCOPY      screening - neg results per patient   ??? HX ENDOSCOPY  10/12/2018    Dr Judie Grieve, [facility]   ??? HX GYN      ectopic preg with tube rupture   ??? HX KNEE REPLACEMENT Right 12/2017     Dr Vinson Moselle, ortho   ??? HX LUMBAR FUSION  0000000    [id number] - complicated by vascular injury   ??? HX ORTHOPAEDIC      left foot surgery - stress fx   ??? HX ORTHOPAEDIC      L &R carpal tunnel   ??? HX TONSILLECTOMY     ??? IR IVC FILTER  2017    removed June 2017 noted in care everywhere   ??? VASCULAR SURGERY PROCEDURE UNLIST      vascular repair of aortic/venocaval repair.      Current Outpatient Medications   Medication Sig   ??? topiramate (Topamax) 25 mg tablet Take  by mouth two (2) times a day.   ??? cyclobenzaprine (FLEXERIL) 10 mg tablet TAKE ONE TABLET BY MOUTH TWICE A DAY AS NEEDED FOR MUSCLE SPASMS   ??? mirabegron ER (MYRBETRIQ) 25 mg ER tablet Take 1 Tab by mouth daily. Indications: a condition where the urge to urinate results in urine leakage   ??? bisacodyL (Dulcolax, bisacodyl,) 5 mg EC tablet Take 5 mg by mouth daily as needed for Constipation.   ??? acetaminophen-codeine (Tylenol-Codeine #4) 300-60 mg tab Take 1 Tab by mouth every eight (8) hours as needed.   ??? traZODone (DESYREL) 50 mg tablet Take 1 Tab by mouth nightly. insomnia     No current facility-administered medications for this visit.      Allergies and Intolerances:   Allergies   Allergen Reactions   ??? Darvocet A500 [Propoxyphene N-Acetaminophen] Hives     Family History:   Family History   Problem Relation Age of Onset   ??? Hypertension Mother    ??? Heart Disease Father    ??? Hypertension Father      Social History:   She  reports that she has never smoked. She has never used smokeless tobacco.   Social History     Substance and Sexual Activity   Alcohol Use No     Immunization History:    There is no immunization history on file for this patient.    Review of Systems:   As above included in HPI.   Otherwise 11 point review of  systems negative including constitutional, skin, HENT, eyes, respiratory, cardiovascular, gastrointestinal, genitourinary, endocrine, hematologic, allergy, and neurologic.      Physical:   Visit Vitals  BP 126/74 (BP 1 Location: Right arm, BP Patient Position: Sitting)   Pulse (!) 101   Temp 97.9 ??F (36.6 ??C)   Resp 12   Ht 5\' 5"  (1.651 m)   Wt 323 lb 9.6 oz (146.8 kg)   SpO2 98%   BMI 53.85 kg/m??      Wt Readings from Last 3 Encounters:   01/09/19 323 lb 9.6 oz (146.8 kg)   10/10/18 340 lb (154.2 kg)   10/12/17 339 lb (153.8 kg)         Impression:  Patient Active Problem List   Diagnosis Code   ??? Morbid obesity ([place]) E66.01   ??? Constipation due to opioid therapy K59.03, T40.2X5A   ??? Insomnia G47.00   ??? Chronic low back pain with sciatica M54.40, G89.29       Plan:  Patient has been seen by multiple spine specialists here in [place] and in [facility]. She was evaluated by a disability provider who was able to help her get her Social Security disability benefits after her multiple back surgeries. The unfortunate piece is, this patient does not have a stable spine specialist that she can follow-up with. She was seeing Dr. Hassell Done for her spine issues who referred her to pain management but will no longer see her until she pays her $400 bill that is owed. For now the patient has no spine specialist to follow up with for things such as paperwork needed to be completed for disability. I had informed this patient that I was unaware if I would be able to help her. I did suggest that she follow-up with the disability provider that did her original disability assessment and paperwork. She should be able to locate this person via her disability lawyer. I also informed her she needed to establish care with another spine specialist since she is unable to see Dr. Hassell Done any longer due to patient not having $400 to pay his bill.    UPDATE: 01/10/2019   I reviewed the paperwork that she is seeking to be completed and it appears a specialist for disability needs to complete this paperwork. Originally I did inform the patient that I would complete this paperwork but upon review of the paperwork I decided to speak with a colleague within [facility] who states this paperwork needs to be completed by a specialist not by primary care as we do not know what the patient's disability limitations are. I called the patient to inform her of this but reached her voicemail. I did leave a voicemail with generic information to avoid HIPAA violation. I also instructed the patient to call the office if she had any further questions. A referral to spine has been placed.    I will leave this paperwork at the front desk for the patient to obtain.      Dr. Gaspar Bidding, AGNP-C, DNP  [facility]       Level 4:   Total time: 25 minutes spent with the patient on counseling, answering questions, and/or coordination of care.

## 2019-01-09 NOTE — Progress Notes (Signed)
[facility]  [place], [place]  [phone] fax    01/09/2019    HPI:   Tiffany [name] 1970-08-29 is a pleasant [place] female who presents today to discuss her disability paperwork (Discharge application: Total and Permanent Disability form) that needs to be completed by one of her medical providers. Without this paperwork, pts social security is being garnished for [employer] which she cant repay due to not being able to work. She states she has changed insurances to [facility] and now she is being bounced from provider to provider at [facility] request. She needs to see Dr. Hassell Done for follow-up of her spine issues which is why she is on disability and why she is in pain management. A stimulator was placed in her back which was very effective for her pain but her insurance would not pay for the 1 that she trialed, they would only pay for a generic form so the patient refused that therapy. She cannot return back to Dr. Hassell Done because as she owes him $400 and he will not see her without her paying the money. She last saw him 6 months ago. She also states that she falls quite frequently. She has fallen 3 times in the last 3 months. She ambulates with a standard cane. She states she loses her balance while walking and just falls. She denies any injury with the falls. She is not currently in physical therapy except for she goes to [facility] and she does aquatic therapy. She states she needs assistance getting in and out of the tub and her husband will assist her with that along with her having grab bars. She does have a stool that allows her to step on and off of her bed. She is currently in pain management under Dr.Wienke who states that she will not complete this paperwork that is needed.    Patient sees Dr. Dorthula Perfect, vascular, as needed. She states he wants to close all 3 veins in her left lower extremity and 2 veins in  her right lower extremity due to her history of blood clots. Anytime she has to have any surgical procedures an IVF must be placed.    With all of her pain and disabilities, patient is not able to get out of her house much so her social life has plummeted. She is very limited in what she can do before pain occurs in her back. She can only lift 8 to 10 pounds, she can walk 300 feet, she can stand for 35 minutes, and sit for 30 minutes before her back starts to hurt.    She is in desperate need to have this paperwork completed in order to stop the government from garnishing her wages to repay student loans.    She is extremely frustrated that she cannot find a provider to complete this paperwork.    Past Medical History:   Diagnosis Date   ??? Arthritis    ??? Chronic constipation 07/25/2018    Dr Donovan Kail   ??? 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 Chronic pain of right knee 07/25/2018     Dr Vinson Moselle, ortho   ??? Constipation due to opioid therapy 07/25/2018   ??? DVT (deep venous thrombosis) ([place]) 2017    Left groin; Dr Sallee Provencal, vascular, places IVF preop   ??? Dysphagia     Dr Luster Landsberg   ???  Esophageal obstruction     Dr Donovan Kail [facility]   ??? History of fall 09/05/2018    see brief documentation on 09/05/2018   ??? Insomnia 07/25/2018   ??? Morbid obesity ([place]) 03/11/2016   ??? Spasm of back muscles 07/25/2018   ??? 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 Vomiting     episodic     Past Surgical History:   Procedure Laterality Date   ??? ABDOMEN SURGERY PROC UNLISTED  2017    Open abdomen management (14 days) after vascular injury during lumbar fusion   ??? COLONOSCOPY N/A 10/12/2017    COLONOSCOPY performed by Alferd Patee, MD at [place]   ??? COLONOSCOPY N/A 10/12/2018    COLONOSCOPY performed by Alferd Patee, MD at [facility] ENDOSCOPY   ??? HX ADENOIDECTOMY     ??? HX CHOLECYSTECTOMY  2013   ??? HX COLONOSCOPY  1999    screening - neg results per patient   ??? HX ENDOSCOPY      screening - neg  results per patient   ??? HX ENDOSCOPY  10/12/2018    Dr Judie Grieve, [facility]   ??? HX GYN      ectopic preg with tube rupture   ??? HX KNEE REPLACEMENT Right 12/2017     Dr Vinson Moselle, ortho   ??? HX LUMBAR FUSION  0000000    [id number] - complicated by vascular injury   ??? HX ORTHOPAEDIC      left foot surgery - stress fx   ??? HX ORTHOPAEDIC      L &R carpal tunnel   ??? HX TONSILLECTOMY     ??? IR IVC FILTER  2017    removed June 2017 noted in care everywhere   ??? VASCULAR SURGERY PROCEDURE UNLIST      vascular repair of aortic/venocaval repair.      Current Outpatient Medications   Medication Sig   ??? topiramate (Topamax) 25 mg tablet Take  by mouth two (2) times a day.   ??? cyclobenzaprine (FLEXERIL) 10 mg tablet TAKE ONE TABLET BY MOUTH TWICE A DAY AS NEEDED FOR MUSCLE SPASMS   ??? mirabegron ER (MYRBETRIQ) 25 mg ER tablet Take 1 Tab by mouth daily. Indications: a condition where the urge to urinate results in urine leakage   ??? bisacodyL (Dulcolax, bisacodyl,) 5 mg EC tablet Take 5 mg by mouth daily as needed for Constipation.   ??? acetaminophen-codeine (Tylenol-Codeine #4) 300-60 mg tab Take 1 Tab by mouth every eight (8) hours as needed.   ??? traZODone (DESYREL) 50 mg tablet Take 1 Tab by mouth nightly. insomnia     No current facility-administered medications for this visit.      Allergies and Intolerances:   Allergies   Allergen Reactions   ??? Darvocet A500 [Propoxyphene N-Acetaminophen] Hives     Family History:   Family History   Problem Relation Age of Onset   ??? Hypertension Mother    ??? Heart Disease Father    ??? Hypertension Father      Social History:   She  reports that she has never smoked. She has never used smokeless tobacco.   Social History     Substance and Sexual Activity   Alcohol Use No     Immunization History:    There is no immunization history on file for this patient.    Review of Systems:   As above included in HPI.  Otherwise 11 point review of systems  negative including constitutional, skin, HENT, eyes, respiratory,  cardiovascular, gastrointestinal, genitourinary, endocrine, hematologic, allergy, and neurologic.      Physical:   Visit Vitals  BP 126/74 (BP 1 Location: Right arm, BP Patient Position: Sitting)   Pulse (!) 101   Temp 97.9 ??F (36.6 ??C)   Resp 12   Ht 5\' 5"  (1.651 m)   Wt 323 lb 9.6 oz (146.8 kg)   SpO2 98%   BMI 53.85 kg/m??      Wt Readings from Last 3 Encounters:   01/09/19 323 lb 9.6 oz (146.8 kg)   10/10/18 340 lb (154.2 kg)   10/12/17 339 lb (153.8 kg)         Impression:  Patient Active Problem List   Diagnosis Code   ??? Morbid obesity ([place]) E66.01   ??? Constipation due to opioid therapy K59.03, T40.2X5A   ??? Insomnia G47.00   ??? Chronic low back pain with sciatica M54.40, G89.29       Plan:  Patient has been seen by multiple spine specialists here in [place] and in [facility]. She was evaluated by a disability provider who was able to help her get her Social Security disability benefits after her multiple back surgeries. The unfortunate piece is, this patient does not have a stable spine specialist that she can follow-up with. She was seeing Dr. Hassell Done for her spine issues who referred her to pain management but will no longer see her until she pays her $400 bill that is owed. For now the patient has no spine specialist to follow up with for things such as paperwork needed to be completed for disability. I had informed this patient that I was unaware if I would be able to help her. I did suggest that she follow-up with the disability provider that did her original disability assessment and paperwork. She should be able to locate this person via her disability lawyer. I also informed her she needed to establish care with another spine specialist since she is unable to see Dr. Hassell Done any longer due to patient not having $400 to pay his bill.    UPDATE: 01/10/2019  I reviewed the paperwork that she is seeking to be completed and it appears a specialist for disability needs to complete this paperwork.  Originally I did inform the patient that I would complete this paperwork but upon review of the paperwork I decided to speak with a colleague within [facility] who states this paperwork needs to be completed by a specialist not by primary care as we do not know what the patient's disability limitations are. I called the patient to inform her of this but reached her voicemail. I did leave a voicemail with generic information to avoid HIPAA violation. I also instructed the patient to call the office if she had any further questions. A referral to spine has been placed.    I will leave this paperwork at the front desk for the patient to obtain.      Dr. Gaspar Bidding, AGNP-C, DNP  [facility]       Level 4:   Total time: 25 minutes spent with the patient on counseling, answering questions, and/or coordination of care.

## 2019-01-16 NOTE — Telephone Encounter (Signed)
Papers given to front desk staff today.

## 2019-01-16 NOTE — Telephone Encounter (Signed)
Pt calling, says Ms. Northern called her and told her that she couldn't fill out paperwork she brought in for her. She wants to know what happened to the papers. I don't see anything at the front desk

## 2019-01-16 NOTE — Telephone Encounter (Signed)
Call pt to inform her that the disability form (she had brought to office for AN to complete) is up front for her to pick up.  (form is not completed by AN and that has been explained to patient by AN).

## 2019-01-27 ENCOUNTER — Encounter

## 2019-01-28 MED ORDER — CYCLOBENZAPRINE 10 MG TAB
10 mg | ORAL_TABLET | ORAL | 1 refills | Status: DC
Start: 2019-01-28 — End: 2019-03-11

## 2019-02-14 ENCOUNTER — Ambulatory Visit: Payer: MEDICARE | Attending: Physical Medicine & Rehabilitation | Primary: Nurse Practitioner

## 2019-02-20 ENCOUNTER — Ambulatory Visit
Admit: 2019-02-20 | Discharge: 2019-02-20 | Payer: MEDICARE | Attending: Physical Medicine & Rehabilitation | Primary: Nurse Practitioner

## 2019-02-20 ENCOUNTER — Ambulatory Visit: Attending: Physical Medicine & Rehabilitation | Primary: Nurse Practitioner

## 2019-02-20 DIAGNOSIS — M961 Postlaminectomy syndrome, not elsewhere classified: Secondary | ICD-10-CM

## 2019-02-20 NOTE — Progress Notes (Signed)
[facility]  [address] [place] [address]  Phone: [phone]  Fax: [phone]        PROGRESS NOTE      HISTORY OF PRESENT ILLNESS:   The patient is a 49 y.o. female and was seen today for follow up of a hx of L5-S1 fusion performed in 03/2015 by Dr. Consuella Lose in [place], [place]. Symptoms prior to her surgery consisted of low back pain extending posteriorly into her LLE with paresthesias to the toes. Patient did not experienced relief after the surgery. She reports her symptoms increased after her srurgery and now she c/o low back pain extending into her BLE (L>R). Her pain is not positional. She reports bladder incontinence since her surgery x 1 years. She states she experienced some mild incontinence before her surgery, however it has worsened. Note from Dr. Rudi Heap dated 02/19/16 indicating patient was seen with c/o back pain since 03/2015. Patient underwent L5-S1 laminectomy without relief. It was noted, her artery and vein were severed and surgery had to be prolonged and was hospitalized for 40 days due to surgical complication. At that time she c/o paraesthesias on her BLE and UE as a result of her surgery. Her pain is aggravated with any elongated position. At that time, she was treated with Neurontin, which she dc due to weigh gain. Now she takes Topamax 50 mg BID for 6 months with minimum relief. Regarding Neurontin, patient reports tolerated well and her weight gained has not been stable since her dc Neurontin 300 mg BID. Required multiple follow surgeries including exploratory 03/27/15 to control bleeding the the left common iliac and femoral bypass. Multiple follow up surgeries for abdominal washouts. She subsequently developed a LLE DVT, and an IBC filter was placed. Subsequently, it had been removed. Per patient, she returned to the OR to complete her lumbar surgical fusion. That operative note was not available for our reviewed. Patient has a hx of injections before the surgery. Patient reports a hx of therapy before and after her surgery. She continues with her HEP daily. Patient admits changes in bowel habits x 1 months. She  denies possibility of pregnancy or breastfeeding. Patient denies fever, weight loss, or skin changes. The patient is RHD. Lumbar spine MRI dated 09/09/15 report reviewed. Films not available. 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 Good alignment at the fusion at L5-S1. Lumbar spine CT dated 09/09/15 reviewed. No films available. Per report, postsurgical changes at L5-S1 consisting of fusin with pedicle screws in place in good alignment. 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 BLE EMG dated 02/19/16 reviewed, Is within normal limits. No evidence of lumbosacral radiculopathy. At her last clinical appointment, patient was not interested in further lumbar blocks. I tried her on Cymbalta 30 mg every day. She continued with Topamax as prescribed by Dr. Rudi Heap. I encourage patient to perform her HEP daily.      The patient returns today with progressive lower back pain radiating into the BLE (LLE>RLE) in a S1 distribution to the feet involving the digits x 07/2018 without specific trauma. She rates her pain 4-10/10, previously 8/10. Her pain is exacerbated by lying on her left side. Pt was last seen by me on 03/11/2016 and failed to f/u in 1 month's time. She recalls tolerating the Cymbalta 30 mg daily without benefit. Pt is followed by [facility] and is being treated with Tylenol No.  4. Pt previously received lumbar injections by Dr. Hassell Done without benefit (pt reports 3 in the past 12 months). Pt completed PT in 2020 without benefit. She is compliant with her HEP. She continues to report bladder incontinence which has been present since her surgery. Pt denies change in bowel habits. Note from Dr. Belia Heman dated 10/10/2017 indicating patient underwent [employer] SCS trial. Note from Dr. Hassell Done dated 11/17/2017 indicating patient was seen for f/u low back pain radiating into the RLE. Pain was 6-10/10. Successful SCS trial. Indicated they were considering doing SCS implantation at the surgical center. Pt's BMI was 56 and they could not accommodate her. Indicated he was not comfortable performing surgery on her due to her obesity. She indicated that she was pursuing surgical weight loss. Due to her previous abdominal surgery and scar tissue they would not be able to do it laparoscopically and pt was not interested. Per pt, weight loss specialist at [facility] gave her 4 other specialists she could get a second opinion from and the closest was at [place], [place]. ER note from Dr. Marilynne Drivers dated 12/08/2018 indicating patient presented for acute right knee pain. Note from Alla Feeling, [place] dated 01/09/2019 indicating patient was seen with c/o chronic back pain. Indicated she is in pain management, with Dr. Jorje Guild. Pt was previously followed by Dr. Hassell Done. SCS trial has been beneficial for her pain. Indicated her insurance  denied the stimulator she had the trial with and would only pay for generic brand. Indicated she cannot see Dr. Hassell Done because she owes them money. Reported she needs assistance getting into and out of bathtub. Pt has vascular issues in BLE followed by Dr. Dorthula Perfect. Myelogram Lumbar dated 06/16/2017 films not available for my independent review. Per report, Successful lumbar myelogram. Minimal central canal encroachment at L3/L4. L spine CT dated 06/16/2017 films not available for my independent review. Per report, Numbering for this exam is based on counting down from T1 vertebra on CT chest abdomen pelvis from 2011. 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 Below this are 4 lumbar type vertebral bodies with the sacralized L5. This numbering places the posterior fusion hardware at L4-L5 rather than L5-S1. There is a rudimentary but visible L5-S1 disc space. Posterior fusion hardware and interbody fusion cage at L4-L5 disc level is intact. Loss of disc and foraminal height at this level results in what appears to be moderate to marked left and moderate right foraminal stenosis. No focal disc pathology or central canal stenosis. Mild central canal stenosis and mild foraminal narrowing bilaterally at L3/L4 with moderate facet DJD.     PMP reviewed and indicated she is being treated with Tylenol No. 4 through Dr. Jorje Guild. Body mass index is 56.88 kg/m??.    PCP: Alla Feeling, DNP      Past Medical History:   Diagnosis Date   ??? Arthritis    ??? Chronic constipation 07/25/2018    Dr Donovan Kail   ??? 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 Chronic pain of right knee 07/25/2018     Dr Vinson Moselle, ortho   ??? Constipation due to opioid therapy 07/25/2018   ??? DVT (deep venous thrombosis) ([place]) 2017    Left groin; Dr Sallee Provencal, vascular, places IVF preop   ??? Dysphagia     Dr Luster Landsberg   ??? Esophageal obstruction     Dr Luster Landsberg    ???  History of fall 09/05/2018    see brief documentation on 09/05/2018   ??? Insomnia 07/25/2018   ??? Morbid obesity ([place]) 03/11/2016   ??? Spasm of back muscles 07/25/2018   ??? 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 Vomiting     episodic        Social History     Socioeconomic History   ??? Marital status: MARRIED     Spouse name: Not on file   ??? Number of children: Not on file   ??? Years of education: Not on file   ??? Highest education level: Not on file   Occupational History   ??? Not on file   Social Needs   ??? Financial resource strain: Not on file   ??? Food insecurity     Worry: Not on file     Inability: Not on file   ??? Transportation needs     Medical: Not on file     Non-medical: Not on file   Tobacco Use   ??? Smoking status: Never Smoker   ??? Smokeless tobacco: Never Used   Substance and Sexual Activity   ??? Alcohol use: No   ??? Drug use: No   ??? Sexual activity: Yes     Partners: Male   Lifestyle   ??? Physical activity     Days per week: Not on file     Minutes per session: Not on file   ??? Stress: Not on file   Relationships   ??? Social Product manager on phone: Not on file     Gets together: Not on file     Attends religious service: Not on file     Active member of club or organization: Not on file     Attends meetings of clubs or organizations: Not on file     Relationship status: Not on file   ??? Intimate partner violence     Fear of current or ex partner: Not on file     Emotionally abused: Not on file     Physically abused: Not on file     Forced sexual activity: Not on file   Other Topics Concern   ??? Not on file   Social History Narrative   ??? Not on file       Current Outpatient Medications   Medication Sig Dispense Refill   ??? cyclobenzaprine (FLEXERIL) 10 mg tablet TAKE ONE TABLET BY MOUTH TWICE A DAY AS NEEDED MUSCLE SPASMS 60 Tab 1   ??? topiramate (Topamax) 25 mg tablet Take  by mouth two (2) times a day.      ??? mirabegron ER (MYRBETRIQ) 25 mg ER tablet Take 1 Tab by mouth daily. Indications: a condition where the urge to urinate results in urine leakage 30 Tab 3   ??? acetaminophen-codeine (Tylenol-Codeine #4) 300-60 mg tab Take 1 Tab by mouth every eight (8) hours as needed.     ??? traZODone (DESYREL) 50 mg tablet Take 1 Tab by mouth nightly. insomnia 30 Tab 5   ??? bisacodyL (Dulcolax, bisacodyl,) 5 mg EC tablet Take 5 mg by mouth daily as needed for Constipation.         Allergies   Allergen Reactions   ??? Darvocet A500 [Propoxyphene N-Acetaminophen] Hives          PHYSICAL EXAMINATION    Visit Vitals  BP (!) 129/58 (BP 1 Location: Left arm)  Pulse 76   Temp 98.7 ??F (37.1 ??C)   Resp 17   Ht 5' 2.5" (1.588 m)   Wt 316 lb (143.3 kg)   SpO2 99%   BMI 56.88 kg/m??       CONSTITUTIONAL: NAD, A&O x 3  SENSATION: Decreased sensation to light touch on the LLE circumferentially. Otherwise, intact to light touch throughout  RANGE OF MOTION: The patient has full passive range of motion in all four extremities.  MOTOR:  Straight Leg Raise: Negative, bilateral    Ambulates with a single point cane.               Hip Flex Knee Ext Knee Flex Ankle DF GTE Ankle PF Tone   Right +4/5 +4/5 +4/5 +4/5 +4/5 +4/5 +4/5   Left +4/5 +4/5 +4/5 +4/5 +4/5 +4/5 +4/5       ASSESSMENT   Diagnoses and all orders for this visit:    1. Lumbar post-laminectomy syndrome    2. Lumbar neuritis    3. Morbid obesity ([place])        IMPRESSION AND PLAN:   Patient returns to the office today with c/o progressive lower back pain radiating into the BLE (LLE>RLE) in a S1 distribution to the feet involving the digits. Multiple treatment options were discussed. At this point, pt would not be interested in considering spinal surgical intervention. Pt is interested in pursuing weight loss surgery. She will contact my office with the name of the weight loss specialist she saw at [facility]. Pending approval from Dr. Jorje Guild, I will try her on Cymbalta 30 mg daily and taper her up to 60 mg daily. The risks, benefits, and potential side effects of this medication were discussed. Patient understands and wishes to proceed. Patient advised to call the office if intolerant to new medication. I advised the pt to bring her L spine CT Myelogram to her next appointment. Patient is neurologically intact. I will see the patient back in 1 month's time or earlier if needed.      Written by Vickie Epley, [employer], as dictated by Candi Leash, MD  I examined the patient, reviewed and agree with the note.

## 2019-02-20 NOTE — Addendum Note (Signed)
Addended by: Aline August on: 02/22/2019 01:53 PM     Modules accepted: Orders

## 2019-02-20 NOTE — Progress Notes (Signed)
Progress Notes by Lilian Coma, MD at 02/20/19 1520                Author: Lilian Coma, MD  Service: --  Author Type: Physician       Filed: 02/20/19 1738  Encounter Date: 02/20/2019  Status: Signed          Editor: Lilian Coma, MD (Physician)                       [facility]  [address]  [name], [name] [address]  Phone: [phone]  Fax: [phone]            PROGRESS NOTE         HISTORY OF PRESENT ILLNESS:   The patient is a 49 y.o. female  and was seen today for follow up of a hx of L5-S1 fusion performed in 03/2015 [name]. Symptoms prior to her  surgery consisted of low back pain extending posteriorly into her LLE with paresthesias to the toes. Patient did not experienced relief after the surgery. She reports her symptoms increased after her srurgery and now she c/o low back pain extending into  her BLE (L>R). Her pain is not positional. She reports bladder incontinence since her surgery x 1 years. She states she experienced some mild incontinence before her surgery, however it has worsened. Note from Dr. Rudi Heap dated 02/19/16 indicating  patient was seen with c/o back pain since 03/2015. Patient underwent L5-S1 laminectomy without relief. It was noted, her artery and vein were severed and surgery had to be prolonged and was hospitalized for 40 days due to surgical complication. At  that time she c/o paraesthesias on her BLE and UE as a result of her surgery. Her pain is aggravated with any elongated position. At that time, she was treated with Neurontin, which she dc due to weigh gain. Now she takes  Topamax 50 mg BID for 6 months with minimum relief. Regarding Neurontin, patient reports tolerated well and her weight gained has not been stable since her dc  Neurontin 300 mg BID. Required multiple follow surgeries including exploratory 03/27/15 to control bleeding the the left common iliac and femoral bypass.  Multiple follow up surgeries for abdominal washouts.  She subsequently developed a LLE DVT, and an IBC filter was placed. Subsequently, it had been removed. Per patient, she returned to the OR to complete her lumbar surgical fusion. That operative note was not available for our reviewed. Patient has a hx  of injections before the surgery. Patient reports a hx of therapy before and after her surgery. She continues with her HEP daily. Patient admits changes in bowel habits x 1 months. She denies possibility of pregnancy or breastfeeding. Patient denies  fever, weight loss, or skin changes. The patient is RHD. Lumbar spine MRI dated 09/09/15 report reviewed. Films not available. Per report,  bilateral foraminal stenosis at L5-S1 with impingnment of the existing left and right L5 nerve roots. Good alignment at the fusion at L5-S1. Lumbar spine CT  dated 09/09/15 reviewed. No films available. Per report, postsurgical changes at L5-S1 consisting of fusin with pedicle screws in place in good alignment. 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  BLE EMG dated 02/19/16 reviewed, Is within normal limits. No evidence of lumbosacral radiculopathy. At her  last clinical appointment, patient was not interested in further lumbar blocks. I tried her on Cymbalta 30  mg every day. She continued with Topamax as prescribed by Dr. Rudi Heap. I encourage patient to perform her HEP daily.       The patient returns today with progressive lower back pain radiating into the BLE (LLE>RLE) in a S1 distribution to the feet involving the digits x 07/2018 without specific trauma. She  rates her pain 4-10/10, previously 8/10. Her  pain is exacerbated by lying on her left side. Pt was last seen by me on 03/11/2016 and failed to f/u in 1 month's time. She recalls tolerating the Cymbalta  30 mg daily without benefit. Pt is followed by [facility] and is being treated with Tylenol No. 4. Pt previously received lumbar  injections  by Dr. Hassell Done without benefit (pt reports 3 in the past 12 months). Pt completed PT in 2020 without benefit. She is compliant with  her HEP. She continues to report bladder incontinence which has been present since her surgery. Pt denies change in bowel habits. Note from Dr. Belia Heman dated 10/10/2017 indicating patient underwent [employer] SCS trial. Note from Dr. Hassell Done dated 11/17/2017 indicating patient was seen for f/u low back pain radiating into the RLE. Pain was 6-10/10. Successful SCS trial . Indicated they were considering doing SCS implantation at the surgical center. Pt's BMI was 56 and they could not accommodate her. Indicated he was not comfortable performing surgery on her due to her obesity. She indicated that she was pursuing surgical  weight loss. Due to her previous abdominal surgery and scar tissue they would not be able to do it laparoscopically and pt was not interested. Per pt, weight loss specialist at [facility] [name] gave her 4 other specialists she could get a second opinion from  and the closest was at [place], [name]. ER note from Dr. Marilynne Drivers dated 12/08/2018 indicating patient presented for acute right knee pain. Note from Alla Feeling, [place]  dated 01/09/2019 indicating patient was seen with c/o chronic back pain. Indicated she is in pain management, with Dr. Jorje Guild. Pt was previously  followed by Dr. Hassell Done. SCS trial has been beneficial for her pain. Indicated her insurance denied the stimulator she had the trial with and would  only pay for generic brand. Indicated she cannot see Dr. Hassell Done because she owes them money. Reported she needs assistance getting into and out of bathtub. Pt has vascular issues in BLE followed by Dr. Dorthula Perfect.  Myelogram Lumbar dated 06/16/2017 films not available for my independent review. Per report, Successful lumbar myelogram. Minimal central canal encroachment at L3/L4.  L spine CT dated 06/16/2017 films not available for my independent review. Per report,  Numbering for this exam is based on counting down from T1 vertebra on CT chest abdomen pelvis from 2011. The transitional  vertebral body at T12 has hypoplastic rib on the right and spinous process on the left. Below this are 4 lumbar type vertebral bodies with the sacralized L5. This numbering places the posterior fusion hardware at L4-L5 rather than L5-S1. There is a rudimentary  but visible L5-S1 disc space. Posterior fusion hardware and interbody fusion cage at L4-L5 disc level is intact. Loss of disc and foraminal height at this level results in what appears to be moderate to marked left and moderate right foraminal stenosis.  No focal disc pathology or central canal stenosis. Mild central canal stenosis and mild foraminal narrowing bilaterally at L3/L4 with moderate facet DJD.       PMP reviewed and indicated she is being treated with Tylenol No.  4 through Dr. Jorje Guild. Body mass index is 56.88 kg/m??.      PCP: Alla Feeling, DNP           Past Medical History:        Diagnosis  Date         ?  Arthritis       ?  Chronic constipation  07/25/2018          Dr Donovan Kail         ?  Chronic low back pain with sciatica  07/25/2018          Dr Berna Spare mgt         ?  Chronic pain of right knee  07/25/2018           Dr Vinson Moselle, ortho         ?  Constipation due to opioid therapy  07/25/2018     ?  DVT (deep venous thrombosis) ([place])  2017          Left groin; Dr Sallee Provencal, vascular, places IVF preop         ?  Dysphagia            Dr Luster Landsberg         ?  Esophageal obstruction            Dr Donovan Kail [facility]         ?  History of fall  09/05/2018          see brief documentation on 09/05/2018         ?  Insomnia  07/25/2018     ?  Morbid obesity ([place])  03/11/2016     ?  Spasm of back muscles  07/25/2018     ?  Status post right knee replacement  07/25/2018           Dr Vinson Moselle, ortho         ?  Vomiting            episodic             Social History          Socioeconomic History         ?  Marital status:  MARRIED               Spouse name:  Not on file         ?  Number of children:  Not on file     ?  Years of education:  Not on file     ?  Highest education level:  Not on file       Occupational History        ?  Not on file       Social Needs         ?  Financial resource strain:  Not on file        ?  Food insecurity              Worry:  Not on file         Inability:  Not on file        ?  Transportation needs              Medical:  Not on file         Non-medical:  Not on file       Tobacco Use         ?  Smoking status:  Never Smoker     ?  Smokeless tobacco:  Never Used       Substance and Sexual Activity         ?  Alcohol use:  No     ?  Drug use:  No     ?  Sexual activity:  Yes              Partners:  Male       Lifestyle        ?  Physical activity              Days per week:  Not on file         Minutes per session:  Not on file         ?  Stress:  Not on file       Relationships        ?  Social Health visitor on phone:  Not on file         Gets together:  Not on file         Attends religious service:  Not on file         Active member of club or organization:  Not on file         Attends meetings of clubs or organizations:  Not on file         Relationship status:  Not on file        ?  Intimate partner violence              Fear of current or ex partner:  Not on file         Emotionally abused:  Not on file         Physically abused:  Not on file         Forced sexual activity:  Not on file        Other Topics  Concern        ?  Not on file       Social History Narrative        ?  Not on file             Current Outpatient Medications          Medication  Sig  Dispense  Refill           ?  cyclobenzaprine (FLEXERIL) 10 mg tablet  TAKE ONE TABLET BY MOUTH TWICE A DAY AS NEEDED MUSCLE SPASMS  60 Tab  1     ?  topiramate (Topamax) 25 mg tablet  Take  by mouth two (2) times a day.         ?  mirabegron ER (MYRBETRIQ) 25 mg ER tablet  Take 1 Tab by mouth daily. Indications: a condition where the urge to  urinate results in urine leakage  30 Tab  3     ?  acetaminophen-codeine (Tylenol-Codeine #4) 300-60 mg tab  Take 1 Tab by mouth every eight (8) hours as needed.         ?  traZODone (DESYREL) 50 mg tablet  Take 1 Tab by mouth nightly. insomnia  30 Tab  5           ?  bisacodyL (Dulcolax, bisacodyl,) 5 mg EC tablet  Take 5 mg by mouth daily as needed for Constipation.  Allergies        Allergen  Reactions         ?  Darvocet A500 [Propoxyphene N-Acetaminophen]  Hives               PHYSICAL EXAMINATION      Visit Vitals      BP  (!) 129/58 (BP 1 Location: Left arm)     Pulse  76     Temp  98.7 ??F (37.1 ??C)     Resp  17     Ht  5' 2.5" (1.588 m)     Wt  316 lb (143.3 kg)     SpO2  99%        BMI  56.88 kg/m??          CONSTITUTIONAL: NAD, A&O x 3  SENSATION : Decreased sensation to light touch on the LLE circumferentially. Otherwise, intact to light touch throughout   RANGE OF MOTION: The patient has full passive range of motion in all four extremities.   MOTOR:  Straight Leg Raise: Negative, bilateral      Ambulates with a single point cane.                                             Hip Flex  Knee Ext  Knee Flex  Ankle DF  GTE  Ankle PF  Tone              Right  +4/5  +4/5  +4/5  +4/5  +4/5  +4/5  +4/5              Left  +4/5  +4/5  +4/5  +4/5  +4/5  +4/5  +4/5           ASSESSMENT    Diagnoses and all orders for this visit:      1. Lumbar post-laminectomy syndrome      2. Lumbar neuritis      3. Morbid obesity ([place])            IMPRESSION AND PLAN:   Patient returns to the office today with c/o progressive lower back pain radiating into the BLE (LLE>RLE) in a S1 distribution to the feet involving the digits. Multiple treatment options were discussed.  At this point, pt would not be interested in considering spinal surgical intervention. Pt is interested in pursuing weight loss surgery. She will contact my office with the name of the weight loss specialist she saw at [facility]. Pending approval from   Dr. Jorje Guild, I will try her on Cymbalta 30 mg daily and taper her up to 60 mg daily.  The risks, benefits, and potential side effects of this medication were discussed. Patient understands and wishes to proceed.  Patient advised to call the office if intolerant to new medication. I advised the pt to bring her L spine CT Myelogram to her next appointment. Patient is neurologically intact. I will see the patient back in 1 month's time or earlier if needed.         Written by Vickie Epley, [employer], as dictated by Candi Leash, MD   I examined the patient, reviewed and agree with the note.

## 2019-02-20 NOTE — Addendum Note (Signed)
Addendum Note by Aline August, LPN at [id number]                Author: Aline August, LPN  Service: --  Author Type: Licensed Nurse       Filed: 02/22/19 1353  Encounter Date: 02/20/2019  Status: Signed          Editor: Aline August, LPN (Licensed Nurse)          Addended by: Aline August on: 02/22/2019 01:53 PM    Modules accepted: Orders

## 2019-02-22 MED ORDER — DULOXETINE 60 MG CAP, DELAYED RELEASE
60 mg | ORAL_CAPSULE | Freq: Every day | ORAL | 1 refills | Status: DC
Start: 2019-02-22 — End: 2019-04-23

## 2019-02-22 MED ORDER — DULOXETINE 30 MG CAP, DELAYED RELEASE
30 mg | ORAL_CAPSULE | Freq: Every evening | ORAL | 0 refills | Status: DC
Start: 2019-02-22 — End: 2019-04-23

## 2019-02-22 NOTE — Telephone Encounter (Signed)
Spoke to [place] at Dr. Hansel Starling office. She statet that it would be ok for the patient to start Cymbalta30 an go to 60mg . Medication has been sent to the pharmacy.

## 2019-03-11 ENCOUNTER — Ambulatory Visit
Admit: 2019-03-11 | Discharge: 2019-03-11 | Payer: MEDICARE | Attending: Nurse Practitioner | Primary: Nurse Practitioner

## 2019-03-11 ENCOUNTER — Ambulatory Visit: Attending: Nurse Practitioner | Primary: Nurse Practitioner

## 2019-03-11 DIAGNOSIS — G4700 Insomnia, unspecified: Secondary | ICD-10-CM

## 2019-03-11 MED ORDER — TRAZODONE 100 MG TAB
100 mg | ORAL_TABLET | Freq: Every evening | ORAL | 0 refills | Status: DC
Start: 2019-03-11 — End: 2019-06-05

## 2019-03-11 MED ORDER — TOLTERODINE SR 2 MG 24 HR CAP
2 mg | ORAL_CAPSULE | Freq: Every day | ORAL | 0 refills | Status: DC
Start: 2019-03-11 — End: 2019-03-12

## 2019-03-11 MED ORDER — CYCLOBENZAPRINE 10 MG TAB
10 mg | ORAL_TABLET | Freq: Two times a day (BID) | ORAL | 2 refills | Status: DC | PRN
Start: 2019-03-11 — End: 2019-12-02

## 2019-03-11 NOTE — Progress Notes (Signed)
[facility]  [place], [place]  [phone] fax    03/11/2019    HPI:   Tiffany [name] 10-26-1970 is a pleasant [place] female who presents today for routine physical exam.  Pt lives with hubby , has 0 children, and is unemployed/disabled.      She used to see Dr Nicolette Bang, internal med, until she changed insurance. She follows Dr Dorthula Perfect, vascular, Dr Arma Heading, pain mgt, Dr Vinson Moselle, ortho, and Dr Judie Grieve, GI.     Insomnia: Starting back in June 2020, pt was getting 2.5-3 hours sleep at night. She used to take Lorrin Mais in the past. She has been taking trazodone 50mg  as prescribed in June 2020. Although her insomnia has improved, she believes this medication needs to be increased. She used to be treated for anxiety in the past.    Chronic constipation: due to past surgery, mesh placement, and taking pain med that causes constipation. Usually have BM 1x/w. Dulcolax works sometimes. Had attempted colonoscopy in 2019 but unable to complete due to presence of stool-Dr Judie Grieve.     Chronic back pain: Pt sees Dr Vella Kohler, ortho, and Dr Nat Christen, pain mgt, as scheduled. Dr Arma Heading prescribes tylenol with codeine. Pt states this treatment is not effective so Dr Nat Christen order "pain patches" but the cost is to much so Pt never started them.  Pain is in lower back and down both legs.   Dr Vella Kohler prescribes Cymbalta therapy and pt takes aleve as needed.    Muscle spasms: She continues to suffer from spasms to her lower back. Flexeril effective.     Status post right knee replacement: She had RTK replacement by Dr Vinson Moselle, ortho, Oct 2019. He had prescribed meloxicam for chronic knee pain.     Chronic left shoulder pain: Back July 2020 she fell in the hallway in her home while being chased by an aggressive Rottweiler that a neighbor had given her.  She landed on her left shoulder and has ached every since. She denied any limitations with movement in July and she did not seek treatment. Pt is now complaining of limitations/pain to her left shoulder. Pain score at rest 7/10 and above 10/10 at night. She is also taking Aleve. She has not discussed this with Dr Nat Christen or Dr Vella Kohler.     Urinary urgency: Pt was prescribed Myrbetriq 25mg  in the past for bladder control by the [facility]. She saw a urologist there that would come every so often to the clinic. Patient reports she was taking it twice a day. In July 2020, she requested to be placed back on myrbetriq because she is having frequent incontinence and urinary incontinence. In Oct 2020, pt was placed back on Myrbetriq but never started the medication due to cost. She is requesting an alternative treatment.     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 swelling goes down at night with elevation. Follow up with Dr Dorthula Perfect, vascular, who places IVF preop due to hx of DVTs. Jan 2021 started wearing compression hose to both LEs. Helps with reducing swelling during the day.     Current medications: Topamax 50mg  bid, cymbalta 30mg  every day and cymbalta 60mg  at night, Tyl #3, flexeril 10mg  BID, iron 325//65 every day, dulcolax prn.      Past Medical History:   Diagnosis Date   ??? Arthritis    ??? Chronic constipation  07/25/2018    Dr Donovan Kail   ??? 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 Chronic pain of right knee 07/25/2018     Dr Vinson Moselle, ortho   ??? Constipation due to opioid therapy 07/25/2018   ??? DVT (deep venous thrombosis) ([place]) 2017    Left groin; Dr Sallee Provencal, vascular, places IVF preop   ??? Dysphagia     Dr Luster Landsberg   ??? Esophageal obstruction     Dr Luster Landsberg    ??? History of fall 09/05/2018    see brief documentation on 09/05/2018   ??? Insomnia 07/25/2018   ??? Morbid obesity ([place]) 03/11/2016   ??? Spasm of back muscles 07/25/2018   ??? 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 Vomiting     episodic     Past Surgical History:   Procedure Laterality Date   ??? COLONOSCOPY N/A 10/12/2017    COLONOSCOPY performed by Alferd Patee, MD at [place]   ??? COLONOSCOPY N/A 10/12/2018    COLONOSCOPY performed by Alferd Patee, MD at [facility] ENDOSCOPY   ??? HX ADENOIDECTOMY     ??? HX CHOLECYSTECTOMY  2013   ??? HX COLONOSCOPY  1999    screening - neg results per patient   ??? HX ENDOSCOPY      screening - neg results per patient   ??? HX ENDOSCOPY  10/12/2018    Dr Judie Grieve, [facility]   ??? HX GYN      ectopic preg with tube rupture   ??? HX KNEE REPLACEMENT Right 12/2017     Dr Vinson Moselle, ortho   ??? HX LUMBAR FUSION  0000000    [id number] - complicated by vascular injury   ??? HX ORTHOPAEDIC      left foot surgery - stress fx   ??? HX ORTHOPAEDIC      L &R carpal tunnel   ??? HX TONSILLECTOMY     ??? IR IVC FILTER  2017    removed June 2017 noted in care everywhere   ??? PR ABDOMEN SURGERY PROC UNLISTED  2017    Open abdomen management (14 days) after vascular injury during lumbar fusion   ??? VASCULAR SURGERY PROCEDURE UNLIST      vascular repair of aortic/venocaval repair.      Current Outpatient Medications   Medication Sig   ??? cyclobenzaprine (FLEXERIL) 10 mg tablet Take 1 Tab by mouth two (2) times daily as needed for Muscle Spasm(s). Indications: muscle spasm   ??? traZODone (DESYREL) 100 mg tablet Take 1 Tab by mouth nightly.   ??? tolterodine ER (DETROL-LA) 2 mg ER capsule Take 1 Cap by mouth daily.   ??? DULoxetine (CYMBALTA) 30 mg capsule Take 1 Cap by mouth nightly.   ??? [START ON 03/23/2019] DULoxetine (CYMBALTA) 60 mg capsule Take 1 Cap by mouth daily.   ??? topiramate (Topamax) 25 mg tablet Take  by mouth two (2) times a day.    ??? bisacodyL (Dulcolax, bisacodyl,) 5 mg EC tablet Take 5 mg by mouth daily as needed for Constipation.   ??? acetaminophen-codeine (Tylenol-Codeine #4) 300-60 mg tab Take 1 Tab by mouth every eight (8) hours as needed.     No current facility-administered medications for this visit.      Allergies and Intolerances:   Allergies   Allergen Reactions   ??? Darvocet A500 [Propoxyphene N-Acetaminophen] Hives     Family History:   Family  History   Problem Relation Age of Onset   ??? Hypertension Mother    ??? Heart Disease Father    ??? Hypertension Father      Social History:   She  reports that she has never smoked. She has never used smokeless tobacco.   Social History     Substance and Sexual Activity   Alcohol Use No     Immunization History:    There is no immunization history on file for this patient.    Review of Systems:   As above included in HPI.  Otherwise 11 point review of systems negative including constitutional, skin, HENT, eyes, respiratory, cardiovascular, gastrointestinal, genitourinary, musculoskeletal, endocrine, hematologic, allergy, and neurologic.      Physical:   Visit Vitals  BP 131/84   Pulse 84   Temp 97.5 ??F (36.4 ??C) (Temporal)   Resp 18   Ht 5' 2.5" (1.588 m)   Wt 310 lb (140.6 kg)   SpO2 100%   BMI 55.80 kg/m??      Wt Readings from Last 3 Encounters:   03/11/19 310 lb (140.6 kg)   02/20/19 316 lb (143.3 kg)   01/09/19 323 lb 9.6 oz (146.8 kg)         Exam:   Physical Exam  Constitutional:       Appearance: Normal appearance. She is obese.      Comments: Morbidly obese   HENT:      Head: Normocephalic and atraumatic.      Right Ear: External ear normal.      Left Ear: External ear normal.      Mouth/Throat:      Mouth: Mucous membranes are moist.   Eyes:      Extraocular Movements: Extraocular movements intact.   Neck:      Musculoskeletal: Normal range of motion.   Cardiovascular:      Rate and Rhythm: Normal rate and regular rhythm.      Pulses: Normal pulses.       Heart sounds: Normal heart sounds.      Comments: No edema noted to bil LEs  Pulmonary:      Effort: Pulmonary effort is normal. No respiratory distress.      Breath sounds: Normal breath sounds.   Abdominal:      General: Abdomen is flat.      Palpations: Abdomen is soft.   Musculoskeletal: Normal range of motion.      Comments: Gait stable. No amb device needed. Pt able to lift her LUE to the level of her shoulder. She is unable to place her left hand behind her head or lower back due to pain.    Skin:     General: Skin is warm and dry.   Neurological:      General: No focal deficit present.      Mental Status: She is alert and oriented to person, place, and time.   Psychiatric:         Mood and Affect: Mood normal.         Behavior: Behavior normal.        Body mass index is 55.8 kg/m??.     Review of Data:  Labs reviewed: N/A      Impression:  Patient Active Problem List   Diagnosis Code   ??? Morbid obesity ([place]) E66.01   ??? Constipation due to opioid therapy K59.03, T40.2X5A   ??? Insomnia G47.00   ??? Chronic low back pain with sciatica M54.40, G89.29  Plan:  1. Insomnia, unspecified type  Instructed pt to be cautious when starting an increased dose of med and to avoid driving or operating heavy machinery until they know how the medication will affect them. Pt verb understanding.  ??  Increased dose of trazodone from 50mg  to 100mg .   ??  - traZODone (DESYREL) 100 mg tablet; Take 0.5 Tabs by mouth nightly. insomnia  Dispense: 15 Tab; Refill: 0  ??  2. Chronic low back pain with sciatica, sciatica laterality unspecified, unspecified back pain laterality  In 2017 in [place], [place], pt underwent back surgery for a herniated disc where she experienced vascular complications. She was in ICU critical care x14 days on a vent for recovery. She developed a DVT in left groin. Since then she has undergone 8 additional surgeries related to the vascular complication.   ??   IVF filter must be placed by Dr Dorthula Perfect when patient needs to have a surgical procedure.    Dr Vella Kohler, ortho, prescribes cymbalta therapy.  Dr Nat Christen, pain mgt, prescribes Tyl #3.  ??  - cyclobenzaprine (FLEXERIL) 10 mg tablet; Take 1 Tab by mouth two (2) times daily as needed for Muscle Spasm(s).  Dispense: 60 Tab; Refill: 2    4. Spasm of back muscles  Refill  ??  - cyclobenzaprine (FLEXERIL) 10 mg tablet; Take 1 Tab by mouth two (2) times daily as needed for Muscle Spasm(s).  Dispense: 60 Tab; Refill: 1  ??  5. Chronic constipation Opioid therapy  Pt has a long history of constipation prior to 2017. She had a colonoscopy in 1999 which was negative. Dr Judie Grieve attempted another colonoscopy in 2019 but was unsuccessful due to stool being present. It is normal for this patient to have a BM 1x/w, according to pt. Pt has ducolax on hand and uses as needed.  ??  8. Urinary urgency  Pt continues to have difficulty with urinary urgency. Pt was unable to start Myrbetriq due to cost. Discussed alternative therapies. Will trial detrol.      - tolterodine ER (DETROL-LA) 2 mg ER capsule; Take 1 Cap by mouth daily.  Dispense: 90 Cap; Refill: 0      9. Morbid obesity ([place])  ??  ??  10. Chronic left shoulder pain   Pt fell back in July 2020 and landed on her left side. She denied pain except for some achiness. She denied any limitations to her left shoulder. Today her pain is 7/10 during the day and 10/10 at night and she is unable to lift her left arm passed her shoulder w/o pain. Pt to continue Aleve as needed. Instructed pt cymbalta and tyl #3 should help with pains.     Will refer to Dr Madolyn Frieze. Not sure if Dr Vella Kohler deals with shoulder issues. If it is discovered Dr Vella Kohler will manage shoulder issue, will dc ortho referral to Dr Madolyn Frieze.      - REFERRAL TO ORTHOPEDICS    11. Medication management   Reviewed medication and completed the medication reconciliation with the patient. Reviewed side effects of medications with the patient. Questions were answered and patient verb understanding.  ??    Follow up 3 months  Labs needed 1 week prior to appt: No    Dr. Gaspar Bidding, AGNP-C, DNP  [facility]       Level 4:   Total time: 36 minutes spent with the patient on counseling, answering questions, and/or coordination of care.

## 2019-03-11 NOTE — Progress Notes (Signed)
Chief Complaint   Patient presents with   ??? Shoulder Pain     left shoulder pain, onset 6 months ago       1. Have you been to the ER, urgent care clinic since your last visit?  Hospitalized since your last visit?No    2. Have you seen or consulted any other health care providers outside of the [facility] since your last visit?  Include any pap smears or colon screening. No

## 2019-03-11 NOTE — Progress Notes (Signed)
[facility]  [place], [place]  [phone] fax    03/11/2019    HPI:   Tiffany [name] 06/19/1970 is a pleasant Tiffany [name] female who presents today for routine physical exam.  Pt lives with hubby , has 0 children, and is unemployed/disabled.      She used to see Dr Nicolette Bang, internal med, until she changed insurance. She follows Dr Dorthula Perfect, vascular, Dr Arma Heading, pain mgt, Dr Vinson Moselle, ortho, and Dr Judie Grieve, GI.     Insomnia: Starting back in June 2020, pt was getting 2.5-3 hours sleep at night. She used to take Lorrin Mais in the past. She has been taking trazodone 50mg  as prescribed in June 2020. Although her insomnia has improved, she believes this medication needs to be increased. She used to be treated for anxiety in the past.    Chronic constipation: due to past surgery, mesh placement, and taking pain med that causes constipation. Usually have BM 1x/w. Dulcolax works sometimes. Had attempted colonoscopy in 2019 but unable to complete due to presence of stool-Dr Judie Grieve.     Chronic back pain: Pt sees Dr Vella Kohler, ortho, and Dr Nat Christen, pain mgt, as scheduled. Dr Arma Heading prescribes tylenol with codeine. Pt states this treatment is not effective so Dr Nat Christen order "pain patches" but the cost is to much so Pt never started them.  Pain is in lower back and down both legs.   Dr Vella Kohler prescribes Cymbalta therapy and pt takes aleve as needed.    Muscle spasms: She continues to suffer from spasms to her lower back. Flexeril effective.     Status post right knee replacement: She had RTK replacement by Dr Vinson Moselle, ortho, Oct 2019. He had prescribed meloxicam for chronic knee pain.    Chronic left shoulder pain: Back July 2020 she fell in the hallway in her home while being chased by an aggressive Rottweiler that a neighbor had given her.  She landed on her left shoulder and has ached every since. She denied any limitations with  movement in July and she did not seek treatment. Pt is now complaining of limitations/pain to her left shoulder. Pain score at rest 7/10 and above 10/10 at night. She is also taking Aleve. She has not discussed this with Dr Nat Christen or Dr Vella Kohler.     Urinary urgency: Pt was prescribed Myrbetriq 25mg  in the past for bladder control by the [facility]. She saw a urologist there that would come every so often to the clinic. Patient reports she was taking it twice a day. In July 2020, she requested to be placed back on myrbetriq because she is having frequent incontinence and urinary incontinence. In Oct 2020, pt was placed back on Myrbetriq but never started the medication due to cost. She is requesting an alternative treatment.     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 swelling goes down at night with elevation. Follow up with Dr Dorthula Perfect, vascular, who places IVF preop due to hx of DVTs. Jan 2021 started wearing compression hose to both LEs. Helps with reducing swelling during the day.     Current medications: Topamax 50mg  bid, cymbalta 30mg  every day and cymbalta 60mg  at night, Tyl #3, flexeril 10mg  BID, iron 325//65 every day, dulcolax prn.      Past Medical History:   Diagnosis Date   ??? Arthritis    ??? Chronic constipation  07/25/2018    Dr Donovan Kail   ??? 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 Chronic pain of right knee 07/25/2018     Dr Vinson Moselle, ortho   ??? Constipation due to opioid therapy 07/25/2018   ??? DVT (deep venous thrombosis) ([place]) 2017    Left groin; Dr Sallee Provencal, vascular, places IVF preop   ??? Dysphagia     Dr Luster Landsberg   ??? Esophageal obstruction     Dr Luster Landsberg   ??? History of fall 09/05/2018    see brief documentation on 09/05/2018   ??? Insomnia 07/25/2018   ??? Morbid obesity ([place]) 03/11/2016   ??? Spasm of back muscles 07/25/2018   ??? 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 Vomiting     episodic     Past  Surgical History:   Procedure Laterality Date   ??? COLONOSCOPY N/A 10/12/2017    COLONOSCOPY performed by Alferd Patee, MD at [place]   ??? COLONOSCOPY N/A 10/12/2018    COLONOSCOPY [facility] by [facility]d P[facility]e, MD at [facility] ENDOSCOPY   ??? HX ADENOIDECTOMY     ??? HX CHOLECYSTECTOMY  2013   ??? HX COLONOSCOPY  1999    screening - neg results per patient   ??? HX ENDOSCOPY      screening - neg results per patient   ??? HX ENDOSCOPY  10/12/2018    Dr Judie Grieve, GL[facility]   ??? HX GYN      ectopic preg with tube rupture   ??? HX KNEE REPLACEMENT Right 12/2017     Dr Vinson Moselle, ortho   ??? HX LUMBAR FUSION  0000000    [id number] - complicated by vascular injury   ??? HX ORTHOPAEDIC      left foot surgery - stress fx   ??? HX ORTHOPAEDIC      L &R carpal tunnel   ??? HX TONSILLECTOMY     ??? IR IVC FILTER  2017    removed June 2017 noted in care everywhere   ??? PR ABDOMEN SURGERY PROC UNLISTED  2017    Open abdomen management (14 days) after vascular injury during lumbar fusion   ??? VASCULAR SURGERY PROCEDURE UNLIST      vascular repair of aortic/venocaval repair.      Current Outpatient Medications   Medication Sig   ??? cyclobenzaprine (FLEXERIL) 10 mg tablet Take 1 Tab by mouth two (2) times daily as needed for Muscle Spasm(s). Indications: muscle spasm   ??? traZODone (DESYREL) 100 mg tablet Take 1 Tab by mouth nightly.   ??? tolterodine ER (DETROL-LA) 2 mg ER capsule Take 1 Cap by mouth daily.   ??? DULoxetine (CYMBALTA) 30 mg capsule Take 1 Cap by mouth nightly.   ??? [START ON 03/23/2019] DULoxetine (CYMBALTA) 60 mg capsule Take 1 Cap by mouth daily.   ??? topiramate (Topamax) 25 mg tablet Take  by mouth two (2) times a day.   ??? bisacodyL (Dulcolax, bisacodyl,) 5 mg EC tablet Take 5 mg by mouth daily as needed for Constipation.   ??? acetaminophen-codeine (Tylenol-Codeine #4) 300-60 mg tab Take 1 Tab by mouth every eight (8) hours as needed.     No current facility-administered medications for this visit.      Allergies and Intolerances:   Allergies   Allergen Reactions   ???  Darvocet A500 [Propoxyphene N-Acetaminophen] Hives     Family History:   Family  History   Problem Relation Age of Onset   ??? Hypertension Mother    ??? Heart Disease Father    ??? Hypertension Father      Social History:   She  reports that she has never smoked. She has never used smokeless tobacco.   Social History     Substance and Sexual Activity   Alcohol Use No     Immunization History:    There is no immunization history on file for this patient.    Review of Systems:   As above included in HPI.  Otherwise 11 point review of systems negative including constitutional, skin, HENT, eyes, respiratory, cardiovascular, gastrointestinal, genitourinary, musculoskeletal, endocrine, hematologic, allergy, and neurologic.      Physical:   Visit Vitals  BP 131/84   Pulse 84   Temp 97.5 ??F (36.4 ??C) (Temporal)   Resp 18   Ht 5' 2.5" (1.588 m)   Wt 310 lb (140.6 kg)   SpO2 100%   BMI 55.80 kg/m??      Wt Readings from Last 3 Encounters:   03/11/19 310 lb (140.6 kg)   02/20/19 316 lb (143.3 kg)   01/09/19 323 lb 9.6 oz (146.8 kg)         Exam:   Physical Exam  Constitutional:       Appearance: Normal appearance. She is obese.      Comments: Morbidly obese   HENT:      Head: Normocephalic and atraumatic.      Right Ear: External ear normal.      Left Ear: External ear normal.      Mouth/Throat:      Mouth: Mucous membranes are moist.   Eyes:      Extraocular Movements: Extraocular movements intact.   Neck:      Musculoskeletal: Normal range of motion.   Cardiovascular:      Rate and Rhythm: Normal rate and regular rhythm.      Pulses: Normal pulses.      Heart sounds: Normal heart sounds.      Comments: No edema noted to bil LEs  Pulmonary:      Effort: Pulmonary effort is normal. No respiratory distress.      Breath sounds: Normal breath sounds.   Abdominal:      General: Abdomen is flat.      Palpations: Abdomen is soft.   Musculoskeletal: Normal range of motion.      Comments: Gait stable. No amb device needed. Pt able to lift  her LUE to the level of her shoulder. She is unable to place her left hand behind her head or lower back due to pain.    Skin:     General: Skin is warm and dry.   Neurological:      General: No focal deficit present.      Mental Status: She is alert and oriented to person, place, and time.   Psychiatric:         Mood and Affect: Mood normal.         Behavior: Behavior normal.        Body mass index is 55.8 kg/m??.     Review of Data:  Labs reviewed: N/A      Impression:  Patient Active Problem List   Diagnosis Code   ??? Morbid obesity ([place]) E66.01   ??? Constipation due to opioid therapy K59.03, T40.2X5A   ??? Insomnia G47.00   ??? Chronic low back pain with sciatica M54.40, G89.29  Plan:  1. Insomnia, unspecified type  Instructed pt to be cautious when starting an increased dose of med and to avoid driving or operating heavy machinery until they know how the medication will affect them. Pt verb understanding.  ??  Increased dose of trazodone from 50mg  to 100mg .   ??  - traZODone (DESYREL) 100 mg tablet; Take 0.5 Tabs by mouth nightly. insomnia  Dispense: 15 Tab; Refill: 0  ??  2. Chronic low back pain with sciatica, sciatica laterality unspecified, unspecified back pain laterality  In 2017 in [place], [place], pt underwent back surgery for a herniated disc where she experienced vascular complications. She was in ICU critical care x14 days on a vent for recovery. She developed a DVT in left groin. Since then she has undergone 8 additional surgeries related to the vascular complication.   ??  IVF filter must be placed by Dr Dorthula Perfect when patient needs to have a surgical procedure.    Dr Vella Kohler, ortho, prescribes cymbalta therapy.  Dr Nat Christen, pain mgt, prescribes Tyl #3.  ??  - cyclobenzaprine (FLEXERIL) 10 mg tablet; Take 1 Tab by mouth two (2) times daily as needed for Muscle Spasm(s).  Dispense: 60 Tab; Refill: 2    4. Spasm of back muscles  Refill  ??  - cyclobenzaprine (FLEXERIL) 10 mg tablet; Take 1 Tab by mouth two (2)  times daily as needed for Muscle Spasm(s).  Dispense: 60 Tab; Refill: 1  ??  5. Chronic constipation Opioid therapy  Pt has a long history of constipation prior to 2017. She had a colonoscopy in 1999 which was negative. Dr Judie Grieve attempted another colonoscopy in 2019 but was unsuccessful due to stool being present. It is normal for this patient to have a BM 1x/w, according to pt. Pt has ducolax on hand and uses as needed.  ??  8. Urinary urgency  Pt continues to have difficulty with urinary urgency. Pt was unable to start Myrbetriq due to cost. Discussed alternative therapies. Will trial detrol.      - tolterodine ER (DETROL-LA) 2 mg ER capsule; Take 1 Cap by mouth daily.  Dispense: 90 Cap; Refill: 0      9. Morbid obesity ([place])  ??  ??  10. Chronic left shoulder pain   Pt fell back in July 2020 and landed on her left side. She denied pain except for some achiness. She denied any limitations to her left shoulder. Today her pain is 7/10 during the day and 10/10 at night and she is unable to lift her left arm passed her shoulder w/o pain. Pt to continue Aleve as needed. Instructed pt cymbalta and tyl #3 should help with pains.     Will refer to Dr Madolyn Frieze. Not sure if Dr Vella Kohler deals with shoulder issues. If it is discovered Dr Vella Kohler will manage shoulder issue, will dc ortho referral to Dr Madolyn Frieze.      - REFERRAL TO ORTHOPEDICS    11. Medication management  Reviewed medication and completed the medication reconciliation with the patient. Reviewed side effects of medications with the patient. Questions were answered and patient verb understanding.  ??    Follow up 3 months  Labs needed 1 week prior to appt: No    Dr. Gaspar Bidding, AGNP-C, DNP  [facility]       Level 4:   Total time: 36 minutes spent with the patient on counseling, answering questions, and/or coordination of care.

## 2019-03-11 NOTE — Progress Notes (Signed)
 Chief Complaint   Patient presents with   . Shoulder Pain     left shoulder pain, onset 6 months ago       1. Have you been to the ER, urgent care clinic since your last visit?  Hospitalized since your last visit?No    2. Have you seen or consulted any other health care providers outside of the [facility] since your last visit?  Include any pap smears or colon screening. No

## 2019-03-12 ENCOUNTER — Encounter

## 2019-03-12 MED ORDER — OXYBUTYNIN CHLORIDE 5 MG TAB
5 mg | ORAL_TABLET | Freq: Two times a day (BID) | ORAL | 0 refills | Status: DC
Start: 2019-03-12 — End: 2019-06-05

## 2019-03-12 NOTE — Telephone Encounter (Signed)
Copay for Tolterodine is too expensive for patient. Patient was previously on oxybutynin chloride which is covered per patients insurance plan.

## 2019-03-12 NOTE — Telephone Encounter (Signed)
Requested Prescriptions     Signed Prescriptions Disp Refills   ??? oxybutynin (DITROPAN) 5 mg tablet 180 Tab 0     Sig: Take 1 Tab by mouth two (2) times a day. Indications: a condition where the urge to urinate results in urine leakage     Authorizing Provider: Alla Feeling

## 2019-03-12 NOTE — Telephone Encounter (Signed)
Pt made aware of rx sent.

## 2019-03-12 NOTE — Telephone Encounter (Signed)
Notified pt

## 2019-03-19 ENCOUNTER — Ambulatory Visit
Admit: 2019-03-19 | Discharge: 2019-03-19 | Payer: MEDICARE | Attending: Orthopaedic Surgery | Primary: Nurse Practitioner

## 2019-03-19 ENCOUNTER — Ambulatory Visit: Attending: Orthopaedic Surgery | Primary: Nurse Practitioner

## 2019-03-19 DIAGNOSIS — S46012A Strain of muscle(s) and tendon(s) of the rotator cuff of left shoulder, initial encounter: Secondary | ICD-10-CM

## 2019-03-19 NOTE — Progress Notes (Signed)
Tiffany [name]  [birth year]   Chief Complaint   Patient presents with   ??? Shoulder Pain     left, injury - fell        HISTORY OF PRESENT ILLNESS  Tiffany [name] is a 49 y.o. female who presents today for evaluation of left shoulder pain. She rates her pain 8/10 today. Pain has been present since August initially. Pt has fallen 3 times since August. No previous treatment. Has significantly limited ROM. Having night pain. Has hx of surgery on the lumbar spine. Patient describes the pain as aching, stabbing and throbbing that is Constant in nature. Symptoms are worse with movement and laying down, Activity and is better with  nothing. Associated symptoms include nothing. Since problem started, it: is unchanged. Pain does wake patient up at night.  Has taken no meds for the problem.   Has tried following treatments: Injections:NO; Brace:NO; Therapy:NO; Cane/Crutch:NO       Allergies   Allergen Reactions   ??? Darvocet A500 [Propoxyphene N-Acetaminophen] Hives        Past Medical History:   Diagnosis Date   ??? Arthritis    ??? Chronic constipation 07/25/2018    Dr Donovan Kail   ??? 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 Chronic pain of right knee 07/25/2018     Dr Vinson Moselle, ortho   ??? Constipation due to opioid therapy 07/25/2018   ??? DVT (deep venous thrombosis) ([place]) 2017    Left groin; Dr Sallee Provencal, vascular, places IVF preop   ??? Dysphagia     Dr Luster Landsberg   ??? Esophageal obstruction     Dr Luster Landsberg   ??? History of fall 09/05/2018    see brief documentation on 09/05/2018   ??? Insomnia 07/25/2018   ??? Morbid obesity ([place]) 03/11/2016   ??? Spasm of back muscles 07/25/2018   ??? 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 Vomiting     episodic      Social History     Socioeconomic History   ??? Marital status: MARRIED     Spouse name: Not on file   ??? Number of children: Not on file   ??? Years of education: Not on file   ??? Highest education level: Not on file    Occupational History   ??? Not on file   Social Needs   ??? Financial resource strain: Not on file   ??? Food insecurity     Worry: Not on file     Inability: Not on file   ??? Transportation needs     Medical: Not on file     Non-medical: Not on file   Tobacco Use   ??? Smoking status: Never Smoker   ??? Smokeless tobacco: Never Used   Substance and Sexual Activity   ??? Alcohol use: No   ??? Drug use: No   ??? Sexual activity: Yes     Partners: Male   Lifestyle   ??? Physical activity     Days per week: Not on file     Minutes per session: Not on file   ??? Stress: Not on file   Relationships   ??? Social Product manager on phone: Not on file     Gets together: Not on file     Attends religious service: Not on file     Active member of club  or organization: Not on file     Attends meetings of clubs or organizations: Not on file     Relationship status: Not on file   ??? Intimate partner violence     Fear of current or ex partner: Not on file     Emotionally abused: Not on file     Physically abused: Not on file     Forced sexual activity: Not on file   Other Topics Concern   ??? Not on file   Social History Narrative   ??? Not on file      Past Surgical History:   Procedure Laterality Date   ??? COLONOSCOPY N/A 10/12/2017    COLONOSCOPY performed by Alferd Patee, MD at [place]   ??? COLONOSCOPY N/A 10/12/2018    COL[facility]OS[facility]d [facility]e, [facility] ENDOSCOPY   ??? HX ADENOIDECTOMY     ??? HX CHOLECYSTECTOMY  2013   ??? HX COLONOSCOPY  1999    screening - neg results per patient   ??? HX ENDOSCOPY      screening - neg results per patient   ??? HX ENDOSCOPY  10/12/2018    Dr Judie Grieve, GLST   ??? HX GYN      ectopic preg with tube rupture   ??? HX KNEE REPLACEMENT Right 12/2017     Dr Vinson Moselle, ortho   ??? HX LUMBAR FUSION  0000000    [id number] - complicated by vascular injury   ??? HX ORTHOPAEDIC      left foot surgery - stress fx   ??? HX ORTHOPAEDIC      L &R carpal tunnel   ??? HX TONSILLECTOMY     ??? IR IVC FILTER  2017     removed June 2017 noted in care everywhere   ??? PR ABDOMEN SURGERY PROC UNLISTED  2017    Open abdomen management (14 days) after vascular injury during lumbar fusion   ??? VASCULAR SURGERY PROCEDURE UNLIST      vascular repair of aortic/venocaval repair.       Family History   Problem Relation Age of Onset   ??? Hypertension Mother    ??? Heart Disease Father    ??? Hypertension Father       Current Outpatient Medications   Medication Sig   ??? oxybutynin (DITROPAN) 5 mg tablet Take 1 Tab by mouth two (2) times a day. Indications: a condition where the urge to urinate results in urine leakage   ??? cyclobenzaprine (FLEXERIL) 10 mg tablet Take 1 Tab by mouth two (2) times daily as needed for Muscle Spasm(s). Indications: muscle spasm   ??? traZODone (DESYREL) 100 mg tablet Take 1 Tab by mouth nightly.   ??? DULoxetine (CYMBALTA) 30 mg capsule Take 1 Cap by mouth nightly.   ??? [START ON 03/23/2019] DULoxetine (CYMBALTA) 60 mg capsule Take 1 Cap by mouth daily.   ??? topiramate (Topamax) 25 mg tablet Take  by mouth two (2) times a day.   ??? bisacodyL (Dulcolax, bisacodyl,) 5 mg EC tablet Take 5 mg by mouth daily as needed for Constipation.   ??? acetaminophen-codeine (Tylenol-Codeine #4) 300-60 mg tab Take 1 Tab by mouth every eight (8) hours as needed.     No current facility-administered medications for this visit.        REVIEW OF SYSTEM   Patient denies: Weight loss, Fever/Chills, HA, Visual changes, Fatigue, Chest pain, SOB, Abdominal pain, N/V/D/C, Blood in stool or urine, Edema.   Pertinent positive as  above in HPI. All others were negative    PHYSICAL EXAM:   Visit Vitals  BP (!) 101/55 (BP 1 Location: Right upper arm, BP Patient Position: Sitting, BP Cuff Size: Large adult)   Pulse 81   Temp 96.8 ??F (36 ??C) (Skin)   Ht 5' 2.5" (1.588 m)   Wt 310 lb (140.6 kg)   BMI 55.80 kg/m??      The patient is a well-developed, well-nourished female   in no acute distress.  The patient is alert and oriented times three.  The patient is alert and oriented times three. Mood and affect are normal.  LYMPHATIC: lymph nodes are not enlarged and are within normal limits  SKIN: normal in color and non tender to palpation. There are no bruises or abrasions noted.   NEUROLOGICAL: Motor sensory exam is within normal limits. Reflexes are equal bilaterally. There is normal sensation to pinprick and light touch  MUSCULOSKELETAL:  Examination Left shoulder   Skin Intact   AC joint tenderness -   Biceps tenderness -   Forward flexion/Elevation ROM 120   Active abduction ROM 120   Glenohumeral abduction 90   External rotation ROM 45   Internal rotation ROM 30   Apprehension -   Jobe???s Relocation -   Jerk -   Load and Shift -   O???briens -   Speeds -   Impingement sign +   Supraspinatus/Empty Can -, 5/5   External Rotation Strength -, 5/5   Lift Off/Belly Press -, 5/5   Neurovascular Intact       IMAGING: XR of left shoulder with 3 views obtained in the office dated 03/19/2019 was reviewed and read by Dr. Domingo Pulse: Sclerotic changes in the greater tuberosity.      IMPRESSION:      ICD-10-CM ICD-9-CM    1. Traumatic tear of left rotator cuff, unspecified tear extent, initial encounter  S46.012A 840.4 MRI SHOULDER LT WO CONT   2. Left shoulder pain, unspecified chronicity  M25.512 719.41 AMB POC XRAY, SHOULDER; COMPLETE, 2+        PLAN:  1. Pt presents today with left shoulder pain and I am ordering an MRI to r/o a RCT.  Risk factors include: BMI>55   2. No ultrasound exam indicated today  3. No cortisone injection indicated today   4. No Physical/Occupational Therapy indicated today  5. Yes diagnostic test indicated today: MRI L SHOULDER  6. No durable medical equipment indicated today  7. No referral indicated today   8. No medications indicated today:   9. No Narcotic indicated today       RTC following MRI       Scribed by Autumn Brittingham Lillie Columbia) as dictated by Richardo Hanks, MD    I, Dr. Richardo Hanks, confirm that all documentation is accurate.    Richardo Hanks, M.D.   [facility]

## 2019-03-19 NOTE — Progress Notes (Signed)
Progress Notes by Tiffany Hanks, MD at 03/19/19 1430                Author: Richardo Hanks, MD  Service: --  Author Type: Physician       Filed: 03/19/19 1527  Encounter Date: 03/19/2019  Status: Signed          Editor: Tiffany Hanks, MD (Physician)                          Tiffany [name]   September 13, 1970      Chief Complaint       Patient presents with        ?  Shoulder Pain             left, injury - fell            HISTORY OF PRESENT ILLNESS   Tiffany [name] is a 49 y.o.  female who presents today for evaluation of left shoulder pain. She rates  her pain 8/10 today. Pain has been present since August initially. Pt has fallen 3 times since August. No previous treatment. Has significantly limited ROM. Having night pain. Has hx of surgery on the  lumbar spine. Patient describes the pain as aching, stabbing and throbbing that is Constant in nature. Symptoms are worse with  movement and laying down, Activity and is better with  nothing. Associated symptoms include  nothing. Since problem started, it: is unchanged. Pain does wake patient up at night.  Has taken no meds for the problem.    Has tried following treatments: Injections:NO; Brace:NO; Therapy:NO; Cane/Crutch:NO            Allergies        Allergen  Reactions         ?  Darvocet A500 [Propoxyphene N-Acetaminophen]  Hives              Past Medical History:        Diagnosis  Date         ?  Arthritis       ?  Chronic constipation  07/25/2018          Dr Donovan Kail         ?  Chronic low back pain with sciatica  07/25/2018          Dr Berna Spare mgt         ?  Chronic pain of right knee  07/25/2018           Dr Vinson Moselle, ortho         ?  Constipation due to opioid therapy  07/25/2018     ?  DVT (deep venous thrombosis) ([place])  2017          Left groin; Dr Sallee Provencal, vascular, places IVF preop         ?  Dysphagia            Dr Luster Landsberg         ?  Esophageal obstruction            Dr Donovan Kail [facility]         ?  History of fall  09/05/2018           see brief documentation on 09/05/2018         ?  Insomnia  07/25/2018     ?  Morbid obesity ([place])  03/11/2016     ?  Spasm of back  muscles  07/25/2018     ?  Status post right knee replacement  07/25/2018           Dr Vinson Moselle, ortho         ?  Vomiting            episodic           Social History          Socioeconomic History         ?  Marital status:  MARRIED              Spouse name:  Not on file         ?  Number of children:  Not on file     ?  Years of education:  Not on file     ?  Highest education level:  Not on file       Occupational History        ?  Not on file       Social Needs         ?  Financial resource strain:  Not on file        ?  Food insecurity              Worry:  Not on file         Inability:  Not on file        ?  Transportation needs              Medical:  Not on file         Non-medical:  Not on file       Tobacco Use         ?  Smoking status:  Never Smoker     ?  Smokeless tobacco:  Never Used       Substance and Sexual Activity         ?  Alcohol use:  No     ?  Drug use:  No     ?  Sexual activity:  Yes              Partners:  Male       Lifestyle        ?  Physical activity              Days per week:  Not on file         Minutes per session:  Not on file         ?  Stress:  Not on file       Relationships        ?  Social Health visitor on phone:  Not on file         Gets together:  Not on file         Attends religious service:  Not on file         Active member of club or organization:  Not on file         Attends meetings of clubs or organizations:  Not on file         Relationship status:  Not on file        ?  Intimate partner violence              Fear of current or ex partner:  Not on file  Emotionally abused:  Not on file         Physically abused:  Not on file         Forced sexual activity:  Not on file        Other Topics  Concern        ?  Not on file       Social History Narrative        ?  Not on file           Past Surgical History:          Procedure  Laterality  Date          ?  COLONOSCOPY  N/A  10/12/2017          COLONOSCOPY performed by Alferd Patee, MD at [facility] ENDOSCOPY          ?  COLONOSCOPY  N/A  10/12/2018          COLONOSCOPY performed by Alferd Patee, MD at [facility] ENDOSCOPY          ?  HX ADENOIDECTOMY         ?  HX CHOLECYSTECTOMY    2013     ?  HX COLONOSCOPY    1999          screening - neg results per patient          ?  HX ENDOSCOPY              screening - neg results per patient          ?  HX ENDOSCOPY    10/12/2018          Dr Judie Grieve, [place]          ?  HX GYN              ectopic preg with tube rupture          ?  HX KNEE REPLACEMENT  Right  12/2017           Dr Vinson Moselle, ortho          ?  HX LUMBAR FUSION    2017          [id number] - complicated by vascular injury          ?  HX ORTHOPAEDIC              left foot surgery - stress fx          ?  HX ORTHOPAEDIC              L &R carpal tunnel          ?  HX TONSILLECTOMY         ?  IR IVC FILTER    2017          removed June 2017 noted in care everywhere          ?  PR ABDOMEN SURGERY PROC UNLISTED    2017          Open abdomen management (14 days) after vascular injury during lumbar fusion          ?  VASCULAR SURGERY PROCEDURE UNLIST              vascular repair of aortic/venocaval repair.            Family History         Problem  Relation  Age of Onset          ?  Hypertension  Mother       ?  Heart Disease  Father            ?  Hypertension  Father             Current Outpatient Medications        Medication  Sig         ?  oxybutynin (DITROPAN) 5 mg tablet  Take 1 Tab by mouth two (2) times a day. Indications: a condition where the urge to urinate results in urine leakage     ?  cyclobenzaprine (FLEXERIL) 10 mg tablet  Take 1 Tab by mouth two (2) times daily as needed for Muscle Spasm(s). Indications: muscle spasm     ?  traZODone (DESYREL) 100 mg tablet  Take 1 Tab by mouth nightly.     ?  DULoxetine (CYMBALTA) 30 mg capsule  Take 1 Cap by mouth nightly.     ?  [START ON 03/23/2019]  DULoxetine (CYMBALTA) 60 mg capsule  Take 1 Cap by mouth daily.     ?  topiramate (Topamax) 25 mg tablet  Take  by mouth two (2) times a day.     ?  bisacodyL (Dulcolax, bisacodyl,) 5 mg EC tablet  Take 5 mg by mouth daily as needed for Constipation.         ?  acetaminophen-codeine (Tylenol-Codeine #4) 300-60 mg tab  Take 1 Tab by mouth every eight (8) hours as needed.          No current facility-administered medications for this visit.            REVIEW OF SYSTEM    Patient denies: Weight loss, Fever/Chills, HA, Visual changes, Fatigue, Chest pain, SOB, Abdominal pain, N/V/D/C, Blood in stool or urine, Edema.    Pertinent positive as above in HPI. All others were negative      PHYSICAL EXAM:    Visit Vitals      BP  (!) 101/55 (BP 1 Location: Right upper arm, BP Patient Position: Sitting, BP Cuff Size: Large adult)     Pulse  81     Temp  96.8 ??F (36 ??C) (Skin)     Ht  5' 2.5" (1.588 m)     Wt  310 lb (140.6 kg)        BMI  55.80 kg/m??        The patient is a well-developed, well-nourished female    in no acute distress.  The patient is alert and oriented times three.  The patient is alert and oriented times three. Mood and affect are normal.   LYMPHATIC: lymph nodes are not enlarged and are within normal limits   SKIN: normal in color and non tender to palpation. There are no bruises or abrasions noted.    NEUROLOGICAL: Motor sensory exam is within normal limits. Reflexes are equal bilaterally. There is normal sensation to pinprick and light touch   MUSCULOSKELETAL:      Examination  Left shoulder     Skin  Intact     AC joint tenderness  -     Biceps tenderness  -     Forward flexion/Elevation ROM  120     Active abduction ROM  120     Glenohumeral abduction  90     External rotation ROM  45     Internal rotation ROM  30     Apprehension  -     Jobes Relocation  -  Jerk  -     Load and Shift  -     Obriens  -     Speeds  -     Impingement sign  +     Supraspinatus/Empty Can  -, 5/5     External Rotation  Strength  -, 5/5     Lift Off/Belly Press  -, 5/5        Neurovascular  Intact          IMAGING: XR of left shoulder with 3 views obtained in the office dated 03/19/2019 was reviewed and read by Dr. Domingo Pulse: Sclerotic changes  in the greater tuberosity.         IMPRESSION:               ICD-10-CM  ICD-9-CM             1.  Traumatic tear of left rotator cuff, unspecified tear extent, initial encounter   S46.012A  840.4  MRI SHOULDER LT WO CONT           2.  Left shoulder pain, unspecified chronicity   M25.512  719.41  AMB POC XRAY, SHOULDER; COMPLETE, 2+            PLAN:   1. Pt presents today with left shoulder pain and I am ordering an MRI to r/o a RCT.   Risk factors include: BMI>55    2. No ultrasound exam indicated today   3. No cortisone injection indicated today    4. No Physical/Occupational Therapy indicated today   5. Yes diagnostic test indicated today: MRI L SHOULDER   6. No durable medical equipment indicated today   7. No referral indicated today    8. No medications indicated today:    9. No Narcotic indicated today          RTC following MRI         Scribed by Autumn Brittingham Lillie Columbia) as dictated by Tiffany Hanks, MD      I, Dr. Richardo [name], confirm that all documentation is accurate.      Tiffany [name], M.D.    [facility]

## 2019-03-20 ENCOUNTER — Ambulatory Visit: Payer: MEDICARE | Attending: Physical Medicine & Rehabilitation | Primary: Nurse Practitioner

## 2019-03-20 ENCOUNTER — Encounter

## 2019-03-20 NOTE — Progress Notes (Deleted)
[facility]  [address] [place] [address]  Phone: [phone]  Fax: [phone]        PROGRESS NOTE      HISTORY OF PRESENT ILLNESS:  The patient is a 49 y.o. female and was seen today for follow up of progressive lower back pain radiating into the BLE (LLE>RLE) in a S1 distribution to the feet involving the digits x 07/2018 without specific trauma. Pt has hx of L5-S1 fusion performed in 03/2015 by Dr. Nena Polio Nundkumar??in [place], [place].??Symptoms prior to her surgery consisted of low back pain extending posteriorly into her LLE with paresthesias to the toes. Patient did not experienced relief after the surgery. She reports her symptoms increased after her srurgery and now she c/o low back pain extending into her BLE (L>R). Her pain is not positional.  She recalls tolerating the Cymbalta 30 mg daily without benefit. Pt completed PT in 2020 without benefit. She is compliant with her HEP. Pt previously received lumbar injections by Dr. Hassell Done without benefit (pt reports 3 in the past 12 months). She reports bladder incontinence since her surgery x 1 years. She states she experienced some mild incontinence before her surgery, however it has worsened.????Patient??admits??changes??in bowel habits x 1 months.??She denies possibility of pregnancy??or breastfeeding.??Patient denies fever, weight loss, or skin changes. The patient is??RHD. Pt is followed by [facility] and is being treated with Tylenol No. 4. Note from??Dr. Rudi Heap??dated 02/19/16??indicating patient was seen with c/o back pain since 03/2015. Patient underwent L5-S1 laminectomy without relief. It was noted, her??artery and??vein were??severed??and surgery had to be prolonged and was hospitalized for 40 days due to surgical complication.??At that time she c/o paraesthesias on her BLE and UE as a result of her surgery.??Her pain is aggravated with any elongated position. At that time, she was treated with Neurontin, which she  dc due to weigh gain. Now she takes Topamax 50 mg BID for 6 months with minimum relief. Regarding Neurontin, patient reports tolerated well and her weight gained has not been stable since her dc Neurontin 300 mg BID. Required multiple follow surgeries including exploratory 03/27/15 to control bleeding the the left common iliac and femoral bypass. Multiple follow up surgeries for abdominal washouts. She subsequently developed a LLE DVT, and an IBC filter was placed. Subsequently, it had been removed. Per patient, she returned to the OR to complete her lumbar surgical fusion. That operative note was not available for our reviewed.Note from Dr. Belia Heman dated 10/10/2017 indicating patient underwent [employer] SCS trial. Note from Dr. Hassell Done dated 11/17/2017 indicating patient was seen for f/u low back pain radiating into the RLE. Pain was 6-10/10. Successful SCS trial. Indicated they were considering doing SCS implantation at the surgical center. Pt's BMI was 56 and they could not accommodate her. Indicated he was not comfortable performing surgery on her due to her obesity. She indicated that she was pursuing surgical weight loss. Due to her previous abdominal surgery and scar tissue they would not be able to do it laparoscopically and pt was not interested. Per pt, weight loss specialist at [facility] gave her 4 other specialists she could get a second opinion from and the closest was at [place], [place]. ER note from Dr. Marilynne Drivers dated 12/08/2018 indicating patient presented for acute right knee pain. Note from Alla Feeling, [place] dated 01/09/2019 indicating patient was seen with c/o chronic back pain. Indicated she is in pain management, with Dr. Jorje Guild. Pt was previously followed by Dr. Hassell Done. SCS  trial has been beneficial for her pain. Indicated her insurance denied the stimulator she had the trial with and would only pay for generic brand. Indicated she cannot see Dr. Hassell Done because she owes them money. Reported she needs  assistance getting into and out of bathtub. Pt has vascular issues in BLE followed by Dr. Dorthula Perfect. Lumbar spine MRI dated??09/09/15 report??reviewed.??Films not available. 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 Good alignment at the fusion at L5-S1. BLE EMG??dated 02/19/16 reviewed, Is within normal limits. No evidence of lumbosacral radiculopathy. Myelogram Lumbar dated 06/16/2017 films not available for my independent review. Per report, Successful lumbar myelogram. Minimal central canal encroachment at L3/L4. L spine CT dated 06/16/2017 films not available for my independent review. Per report, Numbering for this exam is based on counting down from T1 vertebra on CT chest abdomen pelvis from 2011. 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 Below this are 4 lumbar type vertebral bodies with the sacralized L5. This numbering places the posterior fusion hardware at L4-L5 rather than L5-S1. There is a rudimentary but visible L5-S1 disc space. Posterior fusion hardware and interbody fusion cage at L4-L5 disc level is intact. Loss of disc and foraminal height at this level results in what appears to be moderate to marked left and moderate right foraminal stenosis. No focal disc pathology or central canal stenosis. Mild central canal stenosis and mild foraminal narrowing bilaterally at L3/L4 with moderate facet DJD. At her last clinical appointment, at that point, pt would not be interested in considering spinal surgical intervention. Pt was interested in pursuing weight loss surgery. She would contact my office with the name of the weight loss specialist she saw at [facility]. With approval from Dr. Jorje Guild, I tried her on Cymbalta 30 mg daily and tapered her up to 60 mg daily. I advised the pt to bring her L spine CT Myelogram to her next appointment.  ??  The patient returns today with ***. She  rates her pain ***/10, previously 4-10/10. ***        PMP reviewed. There is no height or weight on file to calculate BMI.    PCP: Alla Feeling, DNP      Past Medical History:   Diagnosis Date   ??? Arthritis    ??? Chronic constipation 07/25/2018    Dr Donovan Kail   ??? 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 Chronic pain of right knee 07/25/2018     Dr Vinson Moselle, ortho   ??? Constipation due to opioid therapy 07/25/2018   ??? DVT (deep venous thrombosis) ([place]) 2017    Left groin; Dr Sallee Provencal, vascular, places IVF preop   ??? Dysphagia     Dr Luster Landsberg   ??? Esophageal obstruction     Dr Luster Landsberg   ??? History of fall 09/05/2018    see brief documentation on 09/05/2018   ??? Insomnia 07/25/2018   ??? Morbid obesity ([place]) 03/11/2016   ??? Spasm of back muscles 07/25/2018   ??? 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 Vomiting     episodic        Social History     Socioeconomic History   ??? Marital status: MARRIED     Spouse name: Not on file   ??? Number of  children: Not on file   ??? Years of education: Not on file   ??? Highest education level: Not on file   Occupational History   ??? Not on file   Social Needs   ??? Financial resource strain: Not on file   ??? Food insecurity     Worry: Not on file     Inability: Not on file   ??? Transportation needs     Medical: Not on file     Non-medical: Not on file   Tobacco Use   ??? Smoking status: Never Smoker   ??? Smokeless tobacco: Never Used   Substance and Sexual Activity   ??? Alcohol use: No   ??? Drug use: No   ??? Sexual activity: Yes     Partners: Male   Lifestyle   ??? Physical activity     Days per week: Not on file     Minutes per session: Not on file   ??? Stress: Not on file   Relationships   ??? Social Product manager on phone: Not on file     Gets together: Not on file     Attends religious service: Not on file     Active member of club or organization: Not on file     Attends meetings of clubs or organizations: Not on file      Relationship status: Not on file   ??? Intimate partner violence     Fear of current or ex partner: Not on file     Emotionally abused: Not on file     Physically abused: Not on file     Forced sexual activity: Not on file   Other Topics Concern   ??? Not on file   Social History Narrative   ??? Not on file       Current Outpatient Medications   Medication Sig Dispense Refill   ??? oxybutynin (DITROPAN) 5 mg tablet Take 1 Tab by mouth two (2) times a day. Indications: a condition where the urge to urinate results in urine leakage 180 Tab 0   ??? cyclobenzaprine (FLEXERIL) 10 mg tablet Take 1 Tab by mouth two (2) times daily as needed for Muscle Spasm(s). Indications: muscle spasm 60 Tab 2   ??? traZODone (DESYREL) 100 mg tablet Take 1 Tab by mouth nightly. 90 Tab 0   ??? DULoxetine (CYMBALTA) 30 mg capsule Take 1 Cap by mouth nightly. 30 Cap 0   ??? [START ON 03/23/2019] DULoxetine (CYMBALTA) 60 mg capsule Take 1 Cap by mouth daily. 30 Cap 1   ??? topiramate (Topamax) 25 mg tablet Take  by mouth two (2) times a day.     ??? bisacodyL (Dulcolax, bisacodyl,) 5 mg EC tablet Take 5 mg by mouth daily as needed for Constipation.     ??? acetaminophen-codeine (Tylenol-Codeine #4) 300-60 mg tab Take 1 Tab by mouth every eight (8) hours as needed.         Allergies   Allergen Reactions   ??? Darvocet A500 [Propoxyphene N-Acetaminophen] Hives          PHYSICAL EXAMINATION    There were no vitals taken for this visit.    CONSTITUTIONAL: NAD, A&O x 3  SENSATION: Intact to light touch throughout  ***NEURO: Hoffmann's is negative bilaterally.  RANGE OF MOTION: The patient has full passive range of motion in all four extremities.  MOTOR:  Straight Leg Raise: {Pos/neg/not test:140017::"Negative"}, {right/left:16020}    Ambulates with a ***    ***  Shoulder AB/Flex Elbow Flex Wrist Ext Elbow Ext Wrist Flex Hand Intrin Tone   Right +4/5 +4/5 +4/5 +4/5 +4/5 +4/5 +4/5   Left +4/5 +4/5 +4/5 +4/5 +4/5 +4/5 +4/5             *** Hip Flex Knee Ext Knee Flex Ankle DF  GTE Ankle PF Tone   Right +4/5 +4/5 +4/5 +4/5 +4/5 +4/5 +4/5   Left +4/5 +4/5 +4/5 +4/5 +4/5 +4/5 +4/5       ASSESSMENT   {There are no diagnoses linked to this encounter. (Refresh or delete this SmartLink)}      IMPRESSION AND PLAN:  Patient returns to the office today with c/o ***. Multiple treatment options were discussed.  *** Patient is neurologically intact. *** I will see the patient back in *** month's time or earlier if needed.      Written by Vickie Epley, ScribeKick, as dictated by Candi Leash, MD  I examined the patient, reviewed and agree with the note.

## 2019-03-26 ENCOUNTER — Encounter

## 2019-03-30 ENCOUNTER — Inpatient Hospital Stay: Admit: 2019-03-30 | Payer: MEDICARE | Attending: Orthopaedic Surgery | Primary: Nurse Practitioner

## 2019-03-30 DIAGNOSIS — M25512 Pain in left shoulder: Secondary | ICD-10-CM

## 2019-04-01 ENCOUNTER — Ambulatory Visit: Payer: MEDICARE | Attending: Physical Medicine & Rehabilitation | Primary: Nurse Practitioner

## 2019-04-01 NOTE — Progress Notes (Deleted)
[facility]  [address] [place] [address]  Phone: [phone]  Fax: [phone]        PROGRESS NOTE      HISTORY OF PRESENT ILLNESS:  The patient is a 49 y.o. female and was seen today for follow up of progressive lower back pain radiating into the BLE (LLE>RLE) in a S1 distribution to the feet involving the digits x 07/2018 without specific trauma. Previously, she was seen for hx of L5-S1 fusion performed in 03/2015 by Dr. Nena Polio Nundkumar??in [place], [place].??Symptoms prior to her surgery consisted of low back pain extending posteriorly into her LLE with paresthesias to the toes. Patient did not experienced relief after the surgery. She reports her symptoms increased after her srurgery and now she c/o low back pain extending into her BLE (L>R). Her pain is not positional. She recalls tolerating the Cymbalta 30 mg daily without benefit. Pt is followed by [facility] and is being treated with Tylenol No. 4. Pt previously received lumbar injections by Dr. Hassell Done without benefit (pt reports 3 in the past 12 months). Pt completed PT in 2020 without benefit. She is compliant with her HEP. She continues to report bladder incontinence which has been present since her surgery. Pt denies change in bowel habits. She states she experienced some mild incontinence before her surgery, however it has worsened.??Note from??Dr. Rudi Heap??dated 02/19/16??indicating patient was seen with c/o back pain since 03/2015. Patient underwent L5-S1 laminectomy without relief. It was noted, her??artery and??vein were??severed??and surgery had to be prolonged and was hospitalized for 40 days due to surgical complication.??At that time she c/o paraesthesias on her BLE and UE as a result of her surgery.??Her pain is aggravated with any elongated position. At that time, she was treated with Neurontin, which she dc due to weigh gain. Now she takes Topamax 50 mg BID for 6 months with minimum relief. Regarding  Neurontin, patient reports tolerated well and her weight gained has not been stable since her dc Neurontin 300 mg BID. Required multiple follow surgeries including exploratory 03/27/15 to control bleeding the the left common iliac and femoral bypass. Multiple follow up surgeries for abdominal washouts. She subsequently developed a LLE DVT, and an IBC filter was placed. Subsequently, it had been removed. Per patient, she returned to the OR to complete her lumbar surgical fusion. That operative note was not available for our reviewed.??Patient has a hx of injections before the surgery. Patient reports a hx of therapy before and after her surgery. She continues with her HEP daily.??Patient??admits??changes??in bowel habits x 1 months.??She denies possibility of pregnancy??or breastfeeding.??Patient denies fever, weight loss, or skin changes. The patient is??RHD. Note from Dr. Belia Heman dated 10/10/2017 indicating patient underwent [employer] SCS trial. Note from Dr. Hassell Done dated 11/17/2017 indicating patient was seen for f/u low back pain radiating into the RLE. Pain was 6-10/10. Successful SCS trial. Indicated they were considering doing SCS implantation at the surgical center. Pt's BMI was 56 and they could not accommodate her. Indicated he was not comfortable performing surgery on her due to her obesity. She indicated that she was pursuing surgical weight loss. Due to her previous abdominal surgery and scar tissue they would not be able to do it laparoscopically and pt was not interested. Per pt, weight loss specialist at [facility] gave her 4 other specialists she could get a second opinion from and the closest was at [place], [place]. ER note from Dr. Marilynne Drivers dated 12/08/2018 indicating patient presented for acute right  knee pain. Note from Alla Feeling, [place] dated 01/09/2019 indicating patient was seen with c/o chronic back pain. Indicated she is in pain management, with Dr. Jorje Guild. Pt was previously followed by Dr. Hassell Done. SCS  trial has been beneficial for her pain. Indicated her insurance denied the stimulator she had the trial with and would only pay for generic brand. Indicated she cannot see Dr. Hassell Done because she owes them money. Reported she needs assistance getting into and out of bathtub. Pt has vascular issues in BLE followed by Dr. Dorthula Perfect. Lumbar spine MRI dated??09/09/15 report??reviewed.??Films not available. 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 Good alignment at the fusion at L5-S1.??Lumbar spine??CT??dated??09/09/15??reviewed. No films available.??Per report,??postsurgical changes at L5-S1 consisting of fusin with pedicle screws in place in good alignment. 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 BLE EMG??dated 02/19/16 reviewed, Is within normal limits. No evidence of lumbosacral radiculopathy. Myelogram Lumbar dated 06/16/2017 films not available for my independent review. Per report, Successful lumbar myelogram. Minimal central canal encroachment at L3/L4. L spine CT dated 06/16/2017 films not available for my independent review. Per report, Numbering for this exam is based on counting down from T1 vertebra on CT chest abdomen pelvis from 2011. 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 Below this are 4 lumbar type vertebral bodies with the sacralized L5. This numbering places the posterior fusion hardware at L4-L5 rather than L5-S1. There is a rudimentary but visible L5-S1 disc space. Posterior fusion hardware and interbody fusion cage at L4-L5 disc level is intact. Loss of disc and foraminal height at this level results in what appears to be moderate to marked left and moderate right foraminal stenosis. No focal disc pathology or central canal stenosis. Mild central canal stenosis and mild foraminal narrowing bilaterally at L3/L4 with moderate facet DJD. At her last  clinical appointment, pt was not interested in considering spinal surgical intervention. Pt was interested in pursuing weight loss surgery. She contacted my office with the name of the weight loss specialist she saw at [facility]. With approval from Dr. Jorje Guild, I will try her on Cymbalta 30 mg daily and taper her up to 60 mg daily. I advised the pt to bring her L spine CT Myelogram films to her next appointment.      The patient returns today with ***. She rates her pain ***/10, previously 4-10/10. ***        PMP reviewed. There is no height or weight on file to calculate BMI.    PCP: Alla Feeling, DNP      Past Medical History:   Diagnosis Date   ??? Arthritis    ??? Chronic constipation 07/25/2018    Dr Donovan Kail   ??? 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 Chronic pain of right knee 07/25/2018     Dr Vinson Moselle, ortho   ??? Constipation due to opioid therapy 07/25/2018   ??? DVT (deep venous thrombosis) ([place]) 2017    Left groin; Dr Sallee Provencal, vascular, places IVF preop   ??? Dysphagia     Dr Luster Landsberg   ??? Esophageal obstruction     Dr Luster Landsberg   ??? History of fall 09/05/2018    see brief documentation on 09/05/2018   ??? Insomnia 07/25/2018   ??? Morbid obesity ([place]) 03/11/2016   ??? Spasm of  back muscles 07/25/2018   ??? 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 Vomiting     episodic        Social History     Socioeconomic History   ??? Marital status: MARRIED     Spouse name: Not on file   ??? Number of children: Not on file   ??? Years of education: Not on file   ??? Highest education level: Not on file   Occupational History   ??? Not on file   Social Needs   ??? Financial resource strain: Not on file   ??? Food insecurity     Worry: Not on file     Inability: Not on file   ??? Transportation needs     Medical: Not on file     Non-medical: Not on file   Tobacco Use   ??? Smoking status: Never Smoker   ??? Smokeless tobacco: Never Used   Substance and Sexual Activity   ??? Alcohol use: No    ??? Drug use: No   ??? Sexual activity: Yes     Partners: Male   Lifestyle   ??? Physical activity     Days per week: Not on file     Minutes per session: Not on file   ??? Stress: Not on file   Relationships   ??? Social Product manager on phone: Not on file     Gets together: Not on file     Attends religious service: Not on file     Active member of club or organization: Not on file     Attends meetings of clubs or organizations: Not on file     Relationship status: Not on file   ??? Intimate partner violence     Fear of current or ex partner: Not on file     Emotionally abused: Not on file     Physically abused: Not on file     Forced sexual activity: Not on file   Other Topics Concern   ??? Not on file   Social History Narrative   ??? Not on file       Current Outpatient Medications   Medication Sig Dispense Refill   ??? oxybutynin (DITROPAN) 5 mg tablet Take 1 Tab by mouth two (2) times a day. Indications: a condition where the urge to urinate results in urine leakage 180 Tab 0   ??? cyclobenzaprine (FLEXERIL) 10 mg tablet Take 1 Tab by mouth two (2) times daily as needed for Muscle Spasm(s). Indications: muscle spasm 60 Tab 2   ??? traZODone (DESYREL) 100 mg tablet Take 1 Tab by mouth nightly. 90 Tab 0   ??? DULoxetine (CYMBALTA) 30 mg capsule Take 1 Cap by mouth nightly. 30 Cap 0   ??? DULoxetine (CYMBALTA) 60 mg capsule Take 1 Cap by mouth daily. 30 Cap 1   ??? topiramate (Topamax) 25 mg tablet Take  by mouth two (2) times a day.     ??? bisacodyL (Dulcolax, bisacodyl,) 5 mg EC tablet Take 5 mg by mouth daily as needed for Constipation.     ??? acetaminophen-codeine (Tylenol-Codeine #4) 300-60 mg tab Take 1 Tab by mouth every eight (8) hours as needed.         Allergies   Allergen Reactions   ??? Darvocet A500 [Propoxyphene N-Acetaminophen] Hives          PHYSICAL EXAMINATION    There were  no vitals taken for this visit.    CONSTITUTIONAL: NAD, A&O x 3  SENSATION: Intact to light touch throughout  ***NEURO: Hoffmann's is negative  bilaterally.  RANGE OF MOTION: The patient has full passive range of motion in all four extremities.  MOTOR:  Straight Leg Raise: {Pos/neg/not test:140017::"Negative"}, {right/left:16020}    Ambulates with a ***    *** Shoulder AB/Flex Elbow Flex Wrist Ext Elbow Ext Wrist Flex Hand Intrin Tone   Right +4/5 +4/5 +4/5 +4/5 +4/5 +4/5 +4/5   Left +4/5 +4/5 +4/5 +4/5 +4/5 +4/5 +4/5             *** Hip Flex Knee Ext Knee Flex Ankle DF GTE Ankle PF Tone   Right +4/5 +4/5 +4/5 +4/5 +4/5 +4/5 +4/5   Left +4/5 +4/5 +4/5 +4/5 +4/5 +4/5 +4/5       ASSESSMENT   {There are no diagnoses linked to this encounter. (Refresh or delete this SmartLink)}      IMPRESSION AND PLAN:  Patient returns to the office today with c/o ***. Multiple treatment options were discussed.  *** Patient is neurologically intact. *** I will see the patient back in *** month's time or earlier if needed.      Written by Vickie Epley, ScribeKick, as dictated by Candi Leash, MD  I examined the patient, reviewed and agree with the note.

## 2019-04-03 ENCOUNTER — Ambulatory Visit
Admit: 2019-04-03 | Discharge: 2019-04-03 | Payer: MEDICARE | Attending: Orthopaedic Surgery | Primary: Nurse Practitioner

## 2019-04-03 ENCOUNTER — Ambulatory Visit: Attending: Orthopaedic Surgery | Primary: Nurse Practitioner

## 2019-04-03 DIAGNOSIS — S46012A Strain of muscle(s) and tendon(s) of the rotator cuff of left shoulder, initial encounter: Secondary | ICD-10-CM

## 2019-04-03 NOTE — Progress Notes (Signed)
Tiffany [name]  August 19, 1970   Chief Complaint   Patient presents with   ??? Shoulder Pain     Left shoulder mri results        HISTORY OF PRESENT ILLNESS  Tiffany [name] is a 49 y.o. female who presents today for reevaluation of left shoulder and MRI review. Patient rates pain as 10/10 today. Pain has been present since August initially. Pt has fallen 3 times since August. No previous treatment. Has significantly limited ROM. Having night pain. Has hx of surgery on the lumbar spine. Has hx of blood clots, had a filter placed. Not currently on a blood thinner. Patient denies any fever, chills, chest pain, shortness of breath or calf pain. The remainder of the review of systems is negative. There are no new illness or injuries to report since last seen in the office. There are no changes to medications, allergies, family or social history.     Pain Assessment  04/03/2019   Location of Pain Shoulder   Location Modifiers Left   Severity of Pain 10   Quality of Pain Throbbing;Aching   Quality of Pain Comment stiffness   Duration of Pain Persistent   Frequency of Pain Constant   Aggravating Factors Stretching   Aggravating Factors Comment -   Limiting Behavior Yes   Relieving Factors Rest   Result of Injury -   Work-Related Injury -   Type of Injury -       PHYSICAL EXAM:   Visit Vitals  Temp 97.1 ??F (36.2 ??C) (Temporal)   Resp 16   Ht 5' 2.5" (1.588 m)   Wt 310 lb (140.6 kg)   BMI 55.80 kg/m??     The patient is a well-developed, well-nourished female   in no acute distress.  The patient is alert and oriented times three.  The patient is alert and oriented times three. Mood and affect are normal.  LYMPHATIC: lymph nodes are not enlarged and are within normal limits  SKIN: normal in color and non tender to palpation. There are no bruises or abrasions noted.   NEUROLOGICAL: Motor sensory exam is within normal limits. Reflexes are equal bilaterally. There is normal sensation to pinprick and light touch  MUSCULOSKELETAL:   Examination Left shoulder   Skin Intact   AC joint tenderness -   Biceps tenderness -   Forward flexion/Elevation ROM 120   Active abduction ROM 120   Glenohumeral abduction 90   External rotation ROM 45   Internal rotation ROM 30   Apprehension -   Jobe???s Relocation -   Jerk -   Load and Shift -   O???briens -   Speeds -   Impingement sign +   Supraspinatus/Empty Can -, 5/5   External Rotation Strength -, 5/5   Lift Off/Belly Press -, 5/5   Neurovascular Intact   ??    IMAGING: MRI of left shoulder dated 03/30/2019 was reviewed and read by Dr. Domingo Pulse:   IMPRESSION:  1. High-grade partial-thickness articular sided tear of the central/posterior  supraspinatus tendon insertion. There is also mild infraspinatus and moderate subscapularis insertional tendinosis.  2. Well-corticated os acromiale with synchondrosis fluid and marrow edema, suggesting instability.  3. 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 Minimal subacromial-subdeltoid bursal fluid.      XR of left shoulder with 3 views obtained in the office dated 03/19/2019 was reviewed and read by Dr. Domingo Pulse: Sclerotic changes in the greater  tuberosity.      IMPRESSION:      ICD-10-CM ICD-9-CM    1. Traumatic incomplete tear of left rotator cuff, initial encounter  S46.012A 840.4         PLAN:   1. I discussed the risks and benefits and potential adverse outcomes of both operative vs non operative treatment of left partial RCT with the patient and patient wishes to proceed with arthroscopic left rotator cuff repair.       Risks of operative intervention include but not limited to bleeding, infection, deep vein thrombosis, pulmonary embolism, death, limb length discrepancy, reflexive sympathetic dystrophy, fat embolism syndrome,damage to blood vessels and nerves, malunion, non-union, delayed union, failure of hardware, post traumatic arthritis, stroke, heart attack, and death.      Patient understands that infection may arise and may require numerous surgeries. The patient was counseled at length about the risks of contracting Covid-19 during their perioperative period and any recovery window from their procedure.  The patient was made aware that contracting Covid-19  may worsen their prognosis for recovering from their procedure and lend to a higher morbidity and/or mortality risk.  All material risks, benefits, and reasonable alternatives including postponing the procedure were discussed. The patient does  wish to proceed with the procedure at this time.       History and physical exam to be preformed at a later date.    Risk factors include: BMI>55  2. No ultrasound exam indicated today  3. No cortisone injection indicated today   4. No Physical/Occupational Therapy indicated today  5. No diagnostic test indicated today:   6. No durable medical equipment indicated today  7. No referral indicated today   8. No medications indicated today:   9. No Narcotic indicated today       RTC H&P      Scribed by Autumn Brittingham Lillie Columbia) as dictated by Richardo Hanks, MD    I, Dr. Richardo Hanks, confirm that all documentation is accurate.    Richardo Hanks, M.D.   [facility]

## 2019-04-03 NOTE — Progress Notes (Signed)
Progress Notes by Richardo Hanks, MD at 04/03/19 1520                Author: Richardo Hanks, MD  Service: --  Author Type: Physician       Filed: 04/03/19 1721  Encounter Date: 04/03/2019  Status: Signed          Editor: Richardo Hanks, MD (Physician)                          [name]   [birth year]      Chief Complaint       Patient presents with        ?  Shoulder Pain             Left shoulder mri results            HISTORY OF PRESENT ILLNESS   Tiffany [name] is a 49 y.o.  female who presents today for reevaluation of left shoulder and MRI review. Patient rates pain as 10/10 today. Pain has been present since  August initially. Pt has fallen 3 times since August. No previous treatment. Has significantly limited ROM. Having night pain. Has hx of surgery on the lumbar spine. Has hx of blood clots, had a filter  placed. Not currently on a blood thinner. Patient denies any fever, chills, chest pain, shortness of breath or calf pain. The remainder of the review of systems is negative. There are no new illness or injuries to report since last seen in the office.  There are no changes to medications, allergies, family or social history.          Pain Assessment   04/03/2019        Location of Pain  Shoulder     Location Modifiers  Left     Severity of Pain  10     Quality of Pain  Throbbing;Aching     Quality of Pain Comment  stiffness     Duration of Pain  Persistent     Frequency of Pain  Constant     Aggravating Factors  Stretching     Aggravating Factors Comment  -     Limiting Behavior  Yes     Relieving Factors  Rest     Result of Injury  -     Work-Related Injury  -        Type of Injury  -           PHYSICAL EXAM:    Visit Vitals      Temp  97.1 ??F (36.2 ??C) (Temporal)     Resp  16     Ht  5' 2.5" (1.588 m)     Wt  310 lb (140.6 kg)        BMI  55.80 kg/m??        The patient is a well-developed, well-nourished female    in no acute distress.  The patient is alert and oriented  times three.  The patient is alert and oriented times three. Mood and affect are normal.   LYMPHATIC: lymph nodes are not enlarged and are within normal limits   SKIN: normal in color and non tender to palpation. There are no bruises or abrasions noted.    NEUROLOGICAL: Motor sensory exam is within normal limits. Reflexes are equal bilaterally. There is normal sensation to pinprick and light touch   MUSCULOSKELETAL:      Examination  Left  shoulder     Skin  Intact     AC joint tenderness  -     Biceps tenderness  -     Forward flexion/Elevation ROM  120     Active abduction ROM  120     Glenohumeral abduction  90     External rotation ROM  45     Internal rotation ROM  30     Apprehension  -     Jobes Relocation  -     Jerk  -     Load and Shift  -     Obriens  -     Speeds  -     Impingement sign  +     Supraspinatus/Empty Can  -, 5/5     External Rotation Strength  -, 5/5     Lift Off/Belly Press  -, 5/5     Neurovascular  Intact     ??     IMAGING: MRI of left shoulder dated 03/30/2019  was reviewed and read by Dr. Domingo Pulse:    IMPRESSION:   1. High-grade partial-thickness articular sided tear of the central/posterior   supraspinatus tendon insertion. There is also mild infraspinatus and moderate subscapularis insertional tendinosis.   2. Well-corticated os acromiale with synchondrosis fluid and marrow edema, suggesting instability.   3. 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 Minimal subacromial-subdeltoid bursal fluid.         XR of left shoulder with 3 views obtained in the office dated 03/19/2019 was reviewed and read by Dr. Domingo Pulse: Sclerotic changes in the greater tuberosity.         IMPRESSION:               ICD-10-CM  ICD-9-CM             1.  Traumatic incomplete tear of left rotator cuff, initial encounter   S46.012A  840.4              PLAN:    1. I discussed the risks and benefits and potential adverse  outcomes of both operative vs non operative treatment of left partial RCT with the patient and patient wishes to proceed  with arthroscopic left rotator cuff repair.        Risks of operative intervention include but not limited to bleeding, infection, deep vein thrombosis, pulmonary embolism, death, limb length discrepancy, reflexive sympathetic dystrophy, fat embolism syndrome,damage to blood vessels and nerves, malunion,  non-union, delayed union, failure of hardware, post traumatic arthritis, stroke, heart attack, and death.        Patient understands that infection may arise and may require numerous surgeries. The patient was counseled at length about the risks of contracting Covid-19 during their perioperative period  and any recovery window from their procedure.  The patient was made aware that contracting Covid-19  may worsen their prognosis for recovering from their procedure and lend to a higher morbidity and/or mortality risk.  All material risks, benefits,  and reasonable alternatives including postponing the procedure were discussed. The patient does  wish to proceed with the procedure at this time.          History and physical exam to be preformed at a later date.      Risk factors include: BMI>55   2. No ultrasound exam indicated today   3. No cortisone injection indicated today    4. No Physical/Occupational Therapy indicated today  5. No diagnostic test indicated today:    6. No durable medical equipment indicated today   7. No referral indicated today    8. No medications indicated today:    9. No Narcotic indicated today          RTC H&P         Scribed by Autumn Brittingham Lillie Columbia) as dictated by Richardo Hanks, MD      I, Dr. Richardo Hanks, confirm that all documentation is accurate.      Richardo Hanks, M.D.    [facility]

## 2019-04-05 ENCOUNTER — Ambulatory Visit
Admit: 2019-04-05 | Discharge: 2019-04-05 | Payer: MEDICARE | Attending: Physical Medicine & Rehabilitation | Primary: Nurse Practitioner

## 2019-04-05 ENCOUNTER — Ambulatory Visit: Attending: Physical Medicine & Rehabilitation | Primary: Nurse Practitioner

## 2019-04-05 DIAGNOSIS — M961 Postlaminectomy syndrome, not elsewhere classified: Secondary | ICD-10-CM

## 2019-04-05 MED ORDER — DULOXETINE 60 MG CAP, DELAYED RELEASE
60 mg | ORAL_CAPSULE | Freq: Every day | ORAL | 1 refills | Status: DC
Start: 2019-04-05 — End: 2019-10-17

## 2019-04-05 NOTE — Progress Notes (Signed)
[facility]  [address] [place] [address]  Phone: [phone]  Fax: [phone]        PROGRESS NOTE      HISTORY OF PRESENT ILLNESS:   The patient is a 49 y.o. female and was seen today for follow up of progressive lower back pain radiating into the BLE (LLE>RLE) in a S1 distribution to the feet involving the digits x 07/2018 without specific trauma. Pt has a hx of L5-S1 fusion performed in 03/2015 by Dr. Nena Polio Nundkumar??in [place], [place].??Symptoms prior to her surgery consisted of low back pain extending posteriorly into her LLE with paresthesias to the toes. Patient did not experienced relief after the surgery. She reports her symptoms increased after her srurgery and now she c/o low back pain extending into her BLE (L>R). Her pain is not positional. Pt previously received lumbar injections by Dr. Hassell Done without benefit (pt reports 3 in the past 12 months). Pt completed PT in 2020 without benefit. She is compliant with her HEP. She reports bladder incontinence since her surgery x 1 years. She states she experienced some mild incontinence before her surgery, however it has worsened.??Pt is followed by [facility] and is being treated with Tylenol No. 4. The patient is??RHD. Note from??Dr. Rudi Heap??dated 02/19/16??indicating patient was seen with c/o back pain since 03/2015. Patient underwent L5-S1 laminectomy without relief. It was noted, her??artery and??vein were??severed??and surgery had to be prolonged and was hospitalized for 40 days due to surgical complication.??At that time she c/o paraesthesias on her BLE and UE as a result of her surgery.??Her pain is aggravated with any elongated position. At that time, she was treated with Neurontin, which she dc due to weigh gain. Now she takes Topamax 50 mg BID for 6 months with minimum relief. Regarding Neurontin, patient reports tolerated well and her weight gained has not been stable since her dc Neurontin 300 mg BID. Required multiple follow surgeries including exploratory 03/27/15 to control bleeding the the left common iliac and femoral bypass. Multiple follow up surgeries for abdominal washouts. She  subsequently developed a LLE DVT, and an IBC filter was placed. Subsequently, it had been removed. Per patient, she returned to the OR to complete her lumbar surgical fusion. That operative note was not available for our reviewed.??Patient has a hx of injections before the surgery. Patient reports a hx of therapy before and after her surgery. She continues with her HEP daily.??Patient??admits??changes??in bowel habits x 1 months.??She denies possibility of pregnancy??or breastfeeding.??Patient denies fever, weight loss, or skin changes. Note from Dr. Belia Heman dated 10/10/2017 indicating patient underwent [employer] SCS trial. Note from Dr. Hassell Done dated 11/17/2017 indicating patient was seen for f/u low back pain radiating into the RLE. Pain was 6-10/10. Successful SCS trial. Indicated they were considering doing SCS implantation at the surgical center. Pt's BMI was 56 and they could not accommodate her. Indicated he was not comfortable performing surgery on her due to her obesity. She indicated that she was pursuing surgical weight loss. Due to her previous abdominal surgery and scar tissue they would not be able to do it laparoscopically and pt was not interested. Per pt, Dr. Quita Skye, weight loss specialist at [facility] gave her 4 other specialists she could get a second opinion from and the closest was at [place], [place]. ER note from Dr. Marilynne Drivers dated 12/08/2018 indicating patient presented for acute right knee pain. Note from Alla Feeling, [place] dated 01/09/2019 indicating patient was seen with c/o chronic back  pain. Indicated she is in pain management, with Dr. Jorje Guild. Pt was previously followed by Dr. Hassell Done. SCS trial has been beneficial for her pain. Indicated her insurance denied the stimulator she had the trial with and would only pay for "generic brand". Indicated she cannot see Dr. Hassell Done because she owes them money. Reported she needs assistance getting into and out of bathtub. Pt has vascular issues in BLE followed by  Dr. Dorthula Perfect. Lumbar spine MRI dated??09/09/15 report??reviewed.??Films not available. 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 Good alignment at the fusion at L5-S1.??BLE EMG??dated 02/19/16 reviewed, Is within normal limits. No evidence of lumbosacral radiculopathy. Myelogram Lumbar dated 06/16/2017 films not available for my independent review. Per report, Successful lumbar myelogram. Minimal central canal encroachment at L3/L4. L spine CT dated 06/16/2017 films not available for my independent review. Per report, Numbering for this exam is based on counting down from T1 vertebra on CT chest abdomen pelvis from 2011. 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 Below this are 4 lumbar type vertebral bodies with the sacralized L5. This numbering places the posterior fusion hardware at L4-L5 rather than L5-S1. There is a rudimentary but visible L5-S1 disc space. Posterior fusion hardware and interbody fusion cage at L4-L5 disc level is intact. Loss of disc and foraminal height at this level results in what appears to be moderate to marked left and moderate right foraminal stenosis. No focal disc pathology or central canal stenosis. Mild central canal stenosis and mild foraminal narrowing bilaterally at L3/L4 with moderate facet DJD. At her last clinical appointment, pt was not interested in considering spinal surgical intervention. Pt was interested in pursuing weight loss surgery. She contacted my office with the name of the weight loss specialist she saw at [facility]. With approval from Dr. Jorje Guild, I tried her on Cymbalta 30 mg daily and tapered her up to 60 mg daily. I advised the pt to bring her L spine CT Myelogram to her next appointment.        The patient returns today and reports pain location and distribution remains unchanged. She rates her pain 5-8/10, previously 4-10/10. Pt reports she is taking Cymbalta 30 mg qam and 60 mg qhs.We had given her 2 prescriptions. Cymbalta 30 mg daily to take for 1 month with a do not fill date Cymbalta 60 mg daily. The pharmacy gave her both prescriptions at the same time. Pt did not bring her L spine CT Myelogram films as requested. She reports they did not have her films. Pt followed up with Dr. Domingo Pulse on 04/03/2019 and per pt, she has left rotator cuff tear. Surgery is pending. Pt saw Dr. Andree Elk, bariatric surgeon at [facility]. Pt reports Dr. Andree Elk is leaving the practice and another surgeon will be replacing him. Note from Dr. Domingo Pulse dated 03/19/2019 indicating patient was seen with c/o left shoulder pain. Rated her pain 8/10. Limited ROM. Suspected she had rotator cuff tear. Ordered MRI.       PMP reviewed and indicated she continues to receive Tylenol No. 4 through Dr. Jorje Guild. Body mass index is 55.08 kg/m??.    PCP: Alla Feeling, DNP      Past Medical History:   Diagnosis Date   ??? Arthritis    ??? Chronic constipation 07/25/2018    Dr Donovan Kail   ??? 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 Chronic pain of right knee 07/25/2018     Dr Vinson Moselle, ortho   ??? Constipation due to opioid therapy 07/25/2018   ??? DVT (deep venous thrombosis) ([place]) 2017    Left groin; Dr Sallee Provencal, vascular, places IVF preop   ??? Dysphagia     Dr Luster Landsberg   ??? Esophageal obstruction     Dr Luster Landsberg   ??? History of fall 09/05/2018    see brief documentation on 09/05/2018   ??? Insomnia 07/25/2018   ??? Morbid obesity ([place]) 03/11/2016   ??? Spasm of back muscles 07/25/2018   ??? 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 Vomiting     episodic        Social History     Socioeconomic History   ??? Marital status: MARRIED     Spouse name: Not on file   ??? Number of children: Not on file    ??? Years of education: Not on file   ??? Highest education level: Not on file   Occupational History   ??? Not on file   Social Needs   ??? Financial resource strain: Not on file   ??? Food insecurity     Worry: Not on file     Inability: Not on file   ??? Transportation needs     Medical: Not on file     Non-medical: Not on file   Tobacco Use   ??? Smoking status: Never Smoker   ??? Smokeless tobacco: Never Used   Substance and Sexual Activity   ??? Alcohol use: No   ??? Drug use: No   ??? Sexual activity: Yes     Partners: Male   Lifestyle   ??? Physical activity     Days per week: Not on file     Minutes per session: Not on file   ??? Stress: Not on file   Relationships   ??? Social Product manager on phone: Not on file     Gets together: Not on file     Attends religious service: Not on file     Active member of club or organization: Not on file     Attends meetings of clubs or organizations: Not on file     Relationship status: Not on file   ??? Intimate partner violence     Fear of current or ex partner: Not on file     Emotionally abused: Not on file     Physically abused: Not on file     Forced sexual activity: Not on file   Other Topics Concern   ??? Not on file   Social History Narrative   ??? Not on file       Current Outpatient Medications   Medication Sig Dispense Refill   ??? oxybutynin (DITROPAN) 5 mg tablet Take 1 Tab by mouth two (2) times a day. Indications: a condition where the urge to urinate results in urine leakage 180 Tab 0   ??? cyclobenzaprine (FLEXERIL) 10 mg tablet Take 1 Tab by mouth two (2) times daily as needed for Muscle Spasm(s). Indications: muscle spasm 60 Tab 2   ??? traZODone (DESYREL) 100 mg tablet Take 1 Tab by mouth nightly. 90 Tab 0   ??? DULoxetine (CYMBALTA) 30 mg capsule Take 1 Cap by mouth nightly. 30 Cap 0   ??? DULoxetine (CYMBALTA) 60 mg capsule Take 1 Cap by mouth  daily. 30 Cap 1   ??? topiramate (Topamax) 25 mg tablet Take  by mouth two (2) times a day.      ??? bisacodyL (Dulcolax, bisacodyl,) 5 mg EC tablet Take 5 mg by mouth daily as needed for Constipation.     ??? acetaminophen-codeine (Tylenol-Codeine #4) 300-60 mg tab Take 1 Tab by mouth every eight (8) hours as needed.         Allergies   Allergen Reactions   ??? Darvocet A500 [Propoxyphene N-Acetaminophen] Hives          PHYSICAL EXAMINATION    Visit Vitals  BP 112/70 (BP 1 Location: Left upper arm)   Pulse 85   Temp 97 ??F (36.1 ??C)   Resp 17   Ht 5' 2.5" (1.588 m)   Wt 306 lb (138.8 kg)   SpO2 98%   BMI 55.08 kg/m??       CONSTITUTIONAL: NAD, A&O x 3  SENSATION: Decreased sensation to light touch on the LLE circumferentially. Otherwise, intact to light touch throughout  RANGE OF MOTION: The patient has full passive range of motion in all four extremities.  MOTOR:  Straight Leg Raise: Negative, bilateral                 Hip Flex Knee Ext Knee Flex Ankle DF GTE Ankle PF Tone   Right +4/5 +4/5 +4/5 +4/5 +4/5 +4/5 +4/5   Left +4/5 +4/5 +4/5 +4/5 +4/5 +4/5 +4/5       ASSESSMENT   Diagnoses and all orders for this visit:    1. Lumbar post-laminectomy syndrome    2. Lumbar neuritis    3. Morbid obesity ([place])    4. Tear of left rotator cuff, unspecified tear extent, unspecified whether traumatic        IMPRESSION AND PLAN:  Patient returns to the office today with c/o lower back pain radiating into the BLE (LLE>RLE) in a S1 distribution to the feet involving the digits. Multiple treatment options were discussed. Pt is pending left rotator cuff repair. She should discontinue the Cymbalta 30 mg daily. I provided her refills of Cymbalta 60 mg daily. I will have the patient sign a release of medical information to obtain records from Dr. Hassell Done. Patient is neurologically intact. I will see the patient back in 3 month's time or earlier if needed.      Written by Vickie Epley, [employer], as dictated by Candi Leash, MD  I examined the patient, reviewed and agree with the note.

## 2019-04-05 NOTE — Telephone Encounter (Signed)
Patient came into [facility] office today and brought form into be filled out. Blank form scanned into chart and there is a 35.00 fee to be collected when completed.

## 2019-04-05 NOTE — Progress Notes (Signed)
Progress Notes by Lilian Coma, MD at 04/05/19 1130                Author: Lilian Coma, MD  Service: --  Author Type: Physician       Filed: 04/05/19 1220  Encounter Date: 04/05/2019  Status: Signed          Editor: Lilian Coma, MD (Physician)                       [facility]  [address] [place] [address]  Phone: [phone]  Fax: [phone]            PROGRESS NOTE         HISTORY OF PRESENT ILLNESS:   The patient is a 49 y.o.  female and was seen today for follow up of progressive lower back pain radiating into the BLE (LLE>RLE) in a S1 distribution to the feet involving the digits x 07/2018 without specific trauma. Pt has  a hx of L5-S1 fusion performed in 03/2015 by Dr. Nena Polio Nundkumar??in [place], [place].??Symptoms prior to her surgery consisted of low  back pain extending posteriorly into her LLE with paresthesias to the toes. Patient did not experienced relief after the surgery. She reports her symptoms increased after her srurgery and now she c/o low back pain extending into her BLE (L>R). Her pain  is not positional. Pt previously received lumbar injections by Dr. Hassell Done without benefit (pt reports 3 in the past 12 months). Pt completed PT in 2020 without benefit. She is compliant with her HEP. She reports bladder incontinence since her surgery  x 1 years. She states she experienced some mild incontinence before her surgery, however it has worsened.??Pt is followed by [facility]  and is being treated with Tylenol No. 4. The patient is??RHD. Note from??Dr. Rudi Heap??dated 02/19/16??indicating patient was seen with c/o back pain since 03/2015. Patient underwent L5-S1 laminectomy without relief. It was noted, her??artery  and??vein were??severed??and surgery had to be prolonged and was hospitalized for 40 days due to surgical complication.??At that time she c/o paraesthesias on her BLE and UE as a result of her surgery.??Her pain is  aggravated with any elongated  position. At that time, she was treated with Neurontin, which she dc due to weigh gain. Now she takes Topamax 50 mg BID for 6 months with minimum  relief. Regarding Neurontin, patient reports tolerated well and her weight gained has not been stable since her dc Neurontin 300 mg BID.  Required multiple follow surgeries including exploratory 03/27/15 to control bleeding the the left common iliac and femoral bypass. Multiple follow up surgeries for abdominal washouts. She subsequently developed a LLE DVT, and an IBC filter was placed.  Subsequently, it had been removed. Per patient, she returned to the OR to complete her lumbar surgical fusion. That operative note was not available for our reviewed.??Patient has a hx of injections before the surgery. Patient reports a hx of therapy  before and after her surgery. She continues with her HEP daily.??Patient??admits??changes??in bowel habits x 1 months.??She denies possibility of pregnancy??or breastfeeding.??Patient denies fever, weight loss, or skin changes. Note from Dr.  Belia Heman dated 10/10/2017 indicating patient underwent [employer] SCS trial. Note from Dr. Hassell Done dated 11/17/2017 indicating patient was seen for f/u low back pain radiating into the RLE. Pain was 6-10/10. Successful  SCS trial. Indicated they were considering doing SCS implantation at the surgical center. Pt's BMI was  56 and they could not accommodate her. Indicated he was not comfortable performing surgery on her  due to her obesity. She indicated that she was pursuing surgical weight loss. Due to her previous abdominal surgery and scar tissue they would not be able to do it laparoscopically and pt was not interested. Per pt, Dr. Quita Skye, weight loss specialist at  [facility] gave her 4 other specialists she could get a second opinion from and the closest was at [place], [place]. ER note from Dr. Marilynne Drivers dated 12/08/2018 indicating patient presented for acute right knee pain. Note from  Alla Feeling, [place] dated  01/09/2019 indicating patient was seen with c/o chronic back pain. Indicated she is in pain management, with Dr. Jorje Guild. Pt was previously followed  by Dr. Hassell Done. SCS trial has been beneficial for her pain. Indicated her insurance denied the stimulator she had the trial with and would only  pay for "generic brand". Indicated she cannot see Dr. Hassell Done because she owes them money. Reported she needs assistance getting into and out of bathtub. Pt has vascular issues in BLE followed by Dr. Dorthula Perfect.  Lumbar spine MRI dated??09/09/15 report??reviewed.??Films not available. 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 Good alignment  at the fusion at L5-S1.??BLE EMG??dated 02/19/16 reviewed, Is within normal limits. No evidence of lumbosacral radiculopathy.  Myelogram Lumbar dated 06/16/2017 films not available for my independent review. Per report, Successful lumbar myelogram. Minimal central canal encroachment at L3/L4.  L spine CT dated 06/16/2017 films not available for my independent review. Per report, Numbering for this exam is based on counting down from T1 vertebra on CT chest abdomen pelvis from 2011. 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 Below this are 4 lumbar type vertebral bodies with the sacralized L5. This numbering places the posterior fusion hardware at L4-L5 rather than L5-S1.  There is a rudimentary but visible L5-S1 disc space. Posterior fusion hardware and interbody fusion cage at L4-L5 disc level is intact. Loss of disc and foraminal height at this level results in what appears to be moderate to marked left and moderate  right foraminal stenosis. No focal disc pathology or central canal stenosis. Mild central canal stenosis and mild foraminal narrowing bilaterally at L3/L4 with moderate facet DJD. At her last clinical appointment, pt was not interested  in considering  spinal surgical intervention. Pt was interested in pursuing weight loss surgery. She contacted my office with the name of the weight loss specialist she saw at [facility]. With approval from Dr. Jorje Guild, I tried her on Cymbalta 30 mg daily and tapered  her up to 60 mg daily. I advised the pt to bring her L spine CT Myelogram to her next appointment.          The patient returns today and reports pain location and distribution remains unchanged. She rates  her pain 5-8/10, previously 4-10/10. Pt reports she is taking Cymbalta 30 mg qam and 60 mg qhs.We had given her 2 prescriptions. Cymbalta 30 mg daily to take for 1 month with a do not fill date Cymbalta  60 mg daily. The pharmacy gave her both prescriptions at the same time. Pt did not bring her L spine CT Myelogram films as requested. She reports they did not have her films. Pt followed up with Dr. Domingo Pulse on 04/03/2019 and per pt, she has left rotator  cuff tear.  Surgery is pending. Pt saw Dr. Andree Elk, bariatric surgeon at [facility]. Pt reports Dr. Andree Elk is leaving the practice and another surgeon will be replacing him. Note from Dr. Domingo Pulse dated 03/19/2019 indicating patient was seen with c/o left shoulder  pain. Rated her pain 8/10. Limited ROM. Suspected she had rotator cuff tear. Ordered MRI.          PMP reviewed and indicated she continues to receive Tylenol No. 4 through Dr. Jorje Guild. Body mass index is 55.08 kg/m??.      PCP: Alla Feeling, DNP           Past Medical History:        Diagnosis  Date         ?  Arthritis       ?  Chronic constipation  07/25/2018          Dr Donovan Kail         ?  Chronic low back pain with sciatica  07/25/2018          Dr Berna Spare mgt         ?  Chronic pain of right knee  07/25/2018           Dr Vinson Moselle, ortho         ?  Constipation due to opioid therapy  07/25/2018     ?  DVT (deep venous thrombosis) ([place])  2017          Left groin; Dr Sallee Provencal, vascular, places IVF preop         ?  Dysphagia            Dr Luster Landsberg         ?  Esophageal obstruction            Dr Donovan Kail [facility]         ?  History of fall  09/05/2018          see brief documentation on 09/05/2018         ?  Insomnia  07/25/2018     ?  Morbid obesity ([place])  03/11/2016     ?  Spasm of back muscles  07/25/2018     ?  Status post right knee replacement  07/25/2018           Dr Vinson Moselle, ortho         ?  Vomiting            episodic             Social History          Socioeconomic History         ?  Marital status:  MARRIED              Spouse name:  Not on file         ?  Number of children:  Not on file     ?  Years of education:  Not on file     ?  Highest education level:  Not on file       Occupational History        ?  Not on file       Social Needs         ?  Financial resource strain:  Not on file        ?  Food insecurity              Worry:  Not on file  Inability:  Not on file        ?  Transportation needs              Medical:  Not on file         Non-medical:  Not on file       Tobacco Use         ?  Smoking status:  Never Smoker     ?  Smokeless tobacco:  Never Used       Substance and Sexual Activity         ?  Alcohol use:  No     ?  Drug use:  No     ?  Sexual activity:  Yes              Partners:  Male       Lifestyle        ?  Physical activity              Days per week:  Not on file         Minutes per session:  Not on file         ?  Stress:  Not on file       Relationships        ?  Social Health visitor on phone:  Not on file         Gets together:  Not on file         Attends religious service:  Not on file         Active member of club or organization:  Not on file         Attends meetings of clubs or organizations:  Not on file         Relationship status:  Not on file        ?  Intimate partner violence              Fear of current or ex partner:  Not on file         Emotionally abused:  Not on file         Physically abused:  Not on file         Forced sexual activity:  Not on file        Other Topics  Concern         ?  Not on file       Social History Narrative        ?  Not on file             Current Outpatient Medications          Medication  Sig  Dispense  Refill           ?  oxybutynin (DITROPAN) 5 mg tablet  Take 1 Tab by mouth two (2) times a day. Indications: a condition where the urge to urinate results in urine leakage  180 Tab  0     ?  cyclobenzaprine (FLEXERIL) 10 mg tablet  Take 1 Tab by mouth two (2) times daily as needed for Muscle Spasm(s). Indications: muscle spasm  60 Tab  2     ?  traZODone (DESYREL) 100 mg tablet  Take 1 Tab by mouth nightly.  90 Tab  0     ?  DULoxetine (CYMBALTA) 30 mg capsule  Take 1 Cap by mouth nightly.  30 Cap  0     ?  DULoxetine (CYMBALTA) 60 mg capsule  Take 1 Cap by mouth daily.  30 Cap  1     ?  topiramate (Topamax) 25 mg tablet  Take  by mouth two (2) times a day.         ?  bisacodyL (Dulcolax, bisacodyl,) 5 mg EC tablet  Take 5 mg by mouth daily as needed for Constipation.               ?  acetaminophen-codeine (Tylenol-Codeine #4) 300-60 mg tab  Take 1 Tab by mouth every eight (8) hours as needed.                 Allergies        Allergen  Reactions         ?  Darvocet A500 [Propoxyphene N-Acetaminophen]  Hives               PHYSICAL EXAMINATION      Visit Vitals      BP  112/70 (BP 1 Location: Left upper arm)     Pulse  85     Temp  97 ??F (36.1 ??C)     Resp  17     Ht  5' 2.5" (1.588 m)     Wt  306 lb (138.8 kg)     SpO2  98%        BMI  55.08 kg/m??          CONSTITUTIONAL: NAD, A&O x 3  SENSATION : Decreased sensation to light touch on the LLE circumferentially. Otherwise, intact to light touch throughout   RANGE OF MOTION: The patient has full passive range of motion in all four extremities.   MOTOR:  Straight Leg Raise: Negative, bilateral                                                Hip Flex  Knee Ext  Knee Flex  Ankle DF  GTE  Ankle PF  Tone              Right  +4/5  +4/5  +4/5  +4/5  +4/5  +4/5  +4/5              Left  +4/5  +4/5  +4/5  +4/5  +4/5  +4/5  +4/5            ASSESSMENT    Diagnoses and all orders for this visit:      1. Lumbar post-laminectomy syndrome      2. Lumbar neuritis      3. Morbid obesity ([place])      4. Tear of left rotator cuff, unspecified tear extent, unspecified whether traumatic            IMPRESSION AND PLAN:   Patient returns to the office today with c/o lower back pain radiating into the BLE (LLE>RLE) in a S1 distribution to the feet involving the digits. Multiple treatment options were discussed. Pt is  pending left rotator cuff repair. She should discontinue the Cymbalta 30 mg daily. I provided her refills of Cymbalta 60 mg daily. I will have the patient sign a release of medical information to obtain records from Dr. Hassell Done. Patient is neurologically  intact. I will see the patient back in 3 month's time  or earlier if needed.         Written by Vickie Epley, Scribe[employer], as dictated by Candi Leash, MD   I examined the patient, reviewed and agree with the note.

## 2019-04-09 ENCOUNTER — Encounter

## 2019-04-10 ENCOUNTER — Encounter

## 2019-04-11 MED ORDER — CELECOXIB 200 MG CAP
200 mg | ORAL_CAPSULE | Freq: Two times a day (BID) | ORAL | 2 refills | Status: DC
Start: 2019-04-11 — End: 2019-07-16

## 2019-04-11 NOTE — Telephone Encounter (Signed)
No narcotics prior to surgery. rx for celebrex sent to pharmacy

## 2019-04-11 NOTE — Telephone Encounter (Signed)
Spoke with patient and informed her of PA Julieta Gutting message. Patient verbalized understanding.

## 2019-04-11 NOTE — Telephone Encounter (Signed)
Patient is requesting a pain rx, and wants it sent to the [facility] on file.    Patient can be reached at [phone].

## 2019-04-16 NOTE — Progress Notes (Signed)
Form completed and waiting for /drp signature.     Placed in his office basket for signing

## 2019-04-17 NOTE — Progress Notes (Signed)
LMOVM to let patient know form is ready to be picked up.  I also informed patient there is a $35.00 fee for pick up.

## 2019-04-18 ENCOUNTER — Encounter

## 2019-04-19 ENCOUNTER — Inpatient Hospital Stay: Admit: 2019-04-19 | Payer: MEDICARE | Primary: Nurse Practitioner

## 2019-04-19 DIAGNOSIS — Z01818 Encounter for other preprocedural examination: Secondary | ICD-10-CM

## 2019-04-19 LAB — BASIC METABOLIC PANEL
Anion Gap: 3 mmol/L (ref 3.0–18)
BUN: 12 MG/DL (ref 7.0–18)
Bun/Cre Ratio: 14 (ref 12–20)
CO2: 27 mmol/L (ref 21–32)
Calcium: 9 MG/DL (ref 8.5–10.1)
Chloride: 109 mmol/L (ref 100–111)
Creatinine: 0.86 MG/DL (ref 0.6–1.3)
EGFR IF NonAfrican American: >60 mL/min/{1.73_m2} (ref >60)
GFR African American: >60 mL/min/{1.73_m2} (ref >60)
Glucose: 105 mg/dL — ABNORMAL HIGH (ref 74–99)
Potassium: 4 mmol/L (ref 3.5–5.5)
Sodium: 139 mmol/L (ref 136–145)

## 2019-04-19 LAB — CBC WITH AUTO DIFFERENTIAL
Basophils %: 0 % (ref 0–2)
Basophils Absolute: 0 10*3/uL (ref 0.0–0.1)
Eosinophils %: 4 % (ref 0–5)
Eosinophils Absolute: 0.2 10*3/uL (ref 0.0–0.4)
Hematocrit: 37.4 % (ref 35.0–45.0)
Hemoglobin: 12.2 g/dL (ref 12.0–16.0)
Lymphocytes %: 37 % (ref 21–52)
Lymphocytes Absolute: 2.4 10*3/uL (ref 0.9–3.6)
MCH: 28.9 PG (ref 24.0–34.0)
MCHC: 32.6 g/dL (ref 31.0–37.0)
MCV: 88.6 FL (ref 74.0–97.0)
MPV: 9 FL — ABNORMAL LOW (ref 9.2–11.8)
Monocytes %: 6 % (ref 3–10)
Monocytes Absolute: 0.4 10*3/uL (ref 0.05–1.2)
Neutrophils %: 53 % (ref 40–73)
Neutrophils Absolute: 3.4 10*3/uL (ref 1.8–8.0)
Platelets: 336 10*3/uL (ref 135–420)
RBC: 4.22 M/uL (ref 4.20–5.30)
RDW: 13.6 % (ref 11.6–14.5)
WBC: 6.4 10*3/uL (ref 4.6–13.2)

## 2019-04-19 LAB — EKG 12-LEAD
Atrial Rate: 74 {beats}/min
P Axis: 72 degrees
P-R Interval: 220 ms
Q-T Interval: 384 ms
QRS Duration: 76 ms
QTc Calculation (Bazett): 426 ms
R Axis: 25 degrees
T Axis: 35 degrees
Ventricular Rate: 74 {beats}/min

## 2019-04-19 LAB — CBC WITH AUTOMATED DIFF
ABS. BASOPHILS: 0 10*3/uL (ref 0.0–0.1)
ABS. EOSINOPHILS: 0.2 10*3/uL (ref 0.0–0.4)
ABS. LYMPHOCYTES: 2.4 10*3/uL (ref 0.9–3.6)
ABS. MONOCYTES: 0.4 10*3/uL (ref 0.05–1.2)
ABS. NEUTROPHILS: 3.4 10*3/uL (ref 1.8–8.0)
BASOPHILS: 0 % (ref 0–2)
EOSINOPHILS: 4 % (ref 0–5)
HCT: 37.4 % (ref 35.0–45.0)
HGB: 12.2 g/dL (ref 12.0–16.0)
LYMPHOCYTES: 37 % (ref 21–52)
MCH: 28.9 PG (ref 24.0–34.0)
MCHC: 32.6 g/dL (ref 31.0–37.0)
MCV: 88.6 FL (ref 74.0–97.0)
MONOCYTES: 6 % (ref 3–10)
MPV: 9 FL — ABNORMAL LOW (ref 9.2–11.8)
NEUTROPHILS: 53 % (ref 40–73)
PLATELET: 336 10*3/uL (ref 135–420)
RBC: 4.22 M/uL (ref 4.20–5.30)
RDW: 13.6 % (ref 11.6–14.5)
WBC: 6.4 10*3/uL (ref 4.6–13.2)

## 2019-04-19 LAB — EKG, 12 LEAD, INITIAL
Atrial Rate: 74 {beats}/min
Calculated P Axis: 72 degrees
Calculated R Axis: 25 degrees
Calculated T Axis: 35 degrees
P-R Interval: 220 ms
Q-T Interval: 384 ms
QRS Duration: 76 ms
QTC Calculation (Bezet): 426 ms
Ventricular Rate: 74 {beats}/min

## 2019-04-19 LAB — METABOLIC PANEL, BASIC
Anion gap: 3 mmol/L (ref 3.0–18)
BUN/Creatinine ratio: 14 (ref 12–20)
BUN: 12 MG/DL (ref 7.0–18)
CO2: 27 mmol/L (ref 21–32)
Calcium: 9 MG/DL (ref 8.5–10.1)
Chloride: 109 mmol/L (ref 100–111)
Creatinine: 0.86 MG/DL (ref 0.6–1.3)
GFR est AA: >60 mL/min/{1.73_m2} (ref >60)
GFR est non-AA: >60 mL/min/{1.73_m2} (ref >60)
Glucose: 105 mg/dL — ABNORMAL HIGH (ref 74–99)
Potassium: 4 mmol/L (ref 3.5–5.5)
Sodium: 139 mmol/L (ref 136–145)

## 2019-04-23 ENCOUNTER — Ambulatory Visit
Admit: 2019-04-23 | Discharge: 2019-04-23 | Payer: MEDICARE | Attending: Nurse Practitioner | Primary: Nurse Practitioner

## 2019-04-23 ENCOUNTER — Ambulatory Visit: Attending: Nurse Practitioner | Primary: Nurse Practitioner

## 2019-04-23 DIAGNOSIS — Z01818 Encounter for other preprocedural examination: Secondary | ICD-10-CM

## 2019-04-23 NOTE — Progress Notes (Signed)
04/23/19    Subjective:   I am seeing Tiffany [name] 1970/04/23  is a pleasant BLACK/[place] female for preop exam.    Planned surgery: Left shoulder arthroscopic rotator cuff repair.  Date 05/03/19.  Provider Dr. Domingo Pulse.    ROS: No recent infections, has been feeling well other than presenting surgical complaint. No CNS, cardiorespiratory or GI symptoms otherwise.  She is currently not taking Tylenol No. 4 as she is waiting on pain management to get another therapy approved through her insurance for her chronic back pain.      Past Medical History:   Diagnosis Date   ??? Arthritis    ??? Chronic constipation 07/25/2018    Dr Donovan Kail   ??? 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 Chronic pain of right knee 07/25/2018     Dr Vinson Moselle, ortho   ??? Constipation due to opioid therapy 07/25/2018   ??? DVT (deep venous thrombosis) ([place]) 2017    Left groin; Dr Sallee Provencal, vascular, places IVF preop   ??? Dysphagia     Dr Luster Landsberg   ??? Esophageal obstruction     Dr Luster Landsberg   ??? History of fall 09/05/2018    see brief documentation on 09/05/2018   ??? Insomnia 07/25/2018   ??? Morbid obesity ([place]) 03/11/2016   ??? Spasm of back muscles 07/25/2018   ??? 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 Vomiting     episodic         Current Outpatient Medications on File Prior to Visit   Medication Sig Dispense Refill   ??? celecoxib (CeleBREX) 200 mg capsule Take 1 Cap by mouth two (2) times daily (with meals). 60 Cap 2   ??? DULoxetine (CYMBALTA) 60 mg capsule Take 1 Cap by mouth daily. 30 Cap 1   ??? oxybutynin (DITROPAN) 5 mg tablet Take 1 Tab by mouth two (2) times a day. Indications: a condition where the urge to urinate results in urine leakage 180 Tab 0   ??? cyclobenzaprine (FLEXERIL) 10 mg tablet Take 1 Tab by mouth two (2) times daily as needed for Muscle Spasm(s). Indications: muscle spasm 60 Tab 2   ??? traZODone (DESYREL) 100 mg tablet Take 1 Tab by mouth nightly. 90 Tab 0   ???  topiramate (Topamax) 25 mg tablet Take  by mouth two (2) times a day.     ??? bisacodyL (Dulcolax, bisacodyl,) 5 mg EC tablet Take 5 mg by mouth daily as needed for Constipation.     ??? [DISCONTINUED] DULoxetine (CYMBALTA) 30 mg capsule Take 1 Cap by mouth nightly. 30 Cap 0   ??? [DISCONTINUED] DULoxetine (CYMBALTA) 60 mg capsule Take 1 Cap by mouth daily. 30 Cap 1   ??? [DISCONTINUED] acetaminophen-codeine (Tylenol-Codeine #4) 300-60 mg tab Take 1 Tab by mouth every eight (8) hours as needed.       No current facility-administered medications on file prior to visit.          Allergies   Allergen Reactions   ??? Darvocet A500 [Propoxyphene N-Acetaminophen] Hives         Physical Exam  Vitals signs and nursing note reviewed.   Constitutional:       Appearance: Normal appearance. She is obese.   HENT:      Head: Normocephalic and atraumatic.      Nose: Nose normal.      Mouth/Throat:  Mouth: Mucous membranes are moist.   Eyes:      Extraocular Movements: Extraocular movements intact.      Pupils: Pupils are equal, round, and reactive to light.   Neck:      Musculoskeletal: Normal range of motion and neck supple.      Vascular: No carotid bruit.   Cardiovascular:      Rate and Rhythm: Normal rate and regular rhythm.      Heart sounds: Normal heart sounds.      Comments: No edema or irregularities noted.  Pulmonary:      Effort: Pulmonary effort is normal. No respiratory distress.      Breath sounds: Normal breath sounds. No wheezing.   Abdominal:      General: Abdomen is flat.      Palpations: Abdomen is soft.   Musculoskeletal: Normal range of motion.         General: Tenderness (Left shoulder.  Pain 7 out of 10) present.      Comments: Gait stable.  Noted range of motion to left upper extremity/shoulder.   Skin:     General: Skin is warm and dry.   Neurological:      General: No focal deficit present.      Mental Status: She is alert and oriented to person, place, and time.   Psychiatric:         Mood and Affect: Mood  normal.         Behavior: Behavior normal.         Thought Content: Thought content normal.           Objective:   Visit Vitals  Visit Vitals  BP 118/70   Pulse 80   Temp (!) 96.2 ??F (35.7 ??C) (Temporal)   Resp 16   Ht 5' 2.5" (1.588 m)   Wt 311 lb 3.2 oz (141.2 kg)   SpO2 100%   BMI 56.01 kg/m??     The physical exam is unremarkable. She appears well, alert and oriented, pleasant and cooperative. Vitals as noted. Lungs are clear to auscultation. Heart sounds are normal.  She is asymptomatic of the first-degree heart block.  Abdomen is soft, no tenderness, masses or organomegaly.     Labs (CBC and BMP disease, EKG (first-degree heart block) and medical clearance has been obtained.    Assessment/Plan:       ICD-10-CM ICD-9-CM    1. Preoperative clearance  Z01.818 V72.84    2. First degree heart block by electrocardiogram  I44.0 426.11 REFERRAL TO CARDIOLOGY   3. Morbid obesity (HCC)  E66.01 278.01      Felt to be average risk for the planned procedure.  No contraindications noted.  Routine post prophylaxis as per protocol. Cover with SSS while in house. Patient may proceed with planned surgery. All questions to be answered by surgeon or their staff, risks and benefits to be explained and consent obtained via surgeon and staff.     Did discuss with patient in reference to her EKG results revealing first-degree heart block.  Explained to patient there was no treatment needed at this time.  Patient would like to consult (get a baseline) with a cardiologist after her surgery in order to establish care for work-up or treatment if necessary.  I agree and will refer to Dr. Charisse Klinefelter.    Total time: 22 minutes spent with the patient on counseling, answering questions, and/or coordination of care.       Dr Gaspar Bidding, DNP  [facility]

## 2019-04-23 NOTE — Progress Notes (Signed)
Chief Complaint   Patient presents with   ??? Pre-op Exam     05/03/2019 Left Shoulder Rotator Cuff Repair with Dr. Domingo Pulse       1. Have you been to the ER, urgent care clinic since your last visit?  Hospitalized since your last visit?No    2. Have you seen or consulted any other health care providers outside of the [facility] since your last visit?  Include any pap smears or colon screening. No

## 2019-04-23 NOTE — Progress Notes (Signed)
 Chief Complaint   Patient presents with   . Pre-op Exam     05/03/2019 Left Shoulder Rotator Cuff Repair with Dr. Lawanda       1. Have you been to the ER, urgent care clinic since your last visit?  Hospitalized since your last visit?No    2. Have you seen or consulted any other health care providers outside of the [facility] since your last visit?  Include any pap smears or colon screening. No

## 2019-04-23 NOTE — Progress Notes (Signed)
04/23/19    Subjective:   I am seeing Tiffany [name] [birth year]  is a pleasant BLACK/[place] AMER[place] female for preop exam.    Planned surgery: Left shoulder arthroscopic rotator cuff repair.  Date 05/03/19.  Provider Dr. Domingo Pulse.    ROS: No recent infections, has been feeling well other than presenting surgical complaint. No CNS, cardiorespiratory or GI symptoms otherwise.  She is currently not taking Tylenol No. 4 as she is waiting on pain management to get another therapy approved through her insurance for her chronic back pain.      Past Medical History:   Diagnosis Date   ??? Arthritis    ??? Chronic constipation 07/25/2018    Dr Donovan Kail   ??? 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 Chronic pain of right knee 07/25/2018     Dr Vinson Moselle, ortho   ??? Constipation due to opioid therapy 07/25/2018   ??? DVT (deep venous thrombosis) ([place]) 2017    Left groin; Dr Sallee Provencal, vascular, places IVF preop   ??? Dysphagia     Dr Luster Landsberg   ??? Esophageal obstruction     Dr Luster Landsberg   ??? History of fall 09/05/2018    see brief documentation on 09/05/2018   ??? Insomnia 07/25/2018   ??? Morbid obesity ([place]) 03/11/2016   ??? Spasm of back muscles 07/25/2018   ??? 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 Vomiting     episodic         Current Outpatient Medications on File Prior to Visit   Medication Sig Dispense Refill   ??? celecoxib (CeleBREX) 200 mg capsule Take 1 Cap by mouth two (2) times daily (with meals). 60 Cap 2   ??? DULoxetine (CYMBALTA) 60 mg capsule Take 1 Cap by mouth daily. 30 Cap 1   ??? oxybutynin (DITROPAN) 5 mg tablet Take 1 Tab by mouth two (2) times a day. Indications: a condition where the urge to urinate results in urine leakage 180 Tab 0   ??? cyclobenzaprine (FLEXERIL) 10 mg tablet Take 1 Tab by mouth two (2) times daily as needed for Muscle Spasm(s). Indications: muscle spasm 60 Tab 2   ??? traZODone (DESYREL) 100 mg tablet Take 1 Tab by mouth nightly. 90 Tab 0   ???  topiramate (Topamax) 25 mg tablet Take  by mouth two (2) times a day.     ??? bisacodyL (Dulcolax, bisacodyl,) 5 mg EC tablet Take 5 mg by mouth daily as needed for Constipation.     ??? [DISCONTINUED] DULoxetine (CYMBALTA) 30 mg capsule Take 1 Cap by mouth nightly. 30 Cap 0   ??? [DISCONTINUED] DULoxetine (CYMBALTA) 60 mg capsule Take 1 Cap by mouth daily. 30 Cap 1   ??? [DISCONTINUED] acetaminophen-codeine (Tylenol-Codeine #4) 300-60 mg tab Take 1 Tab by mouth every eight (8) hours as needed.       No current facility-administered medications on file prior to visit.          Allergies   Allergen Reactions   ??? Darvocet A500 [Propoxyphene N-Acetaminophen] Hives         Physical Exam  Vitals signs and nursing note reviewed.   Constitutional:       Appearance: Normal appearance. She is obese.   HENT:      Head: Normocephalic and atraumatic.      Nose: Nose normal.      Mouth/Throat:  Mouth: Mucous membranes are moist.   Eyes:      Extraocular Movements: Extraocular movements intact.      Pupils: Pupils are equal, round, and reactive to light.   Neck:      Musculoskeletal: Normal range of motion and neck supple.      Vascular: No carotid bruit.   Cardiovascular:      Rate and Rhythm: Normal rate and regular rhythm.      Heart sounds: Normal heart sounds.      Comments: No edema or irregularities noted.  Pulmonary:      Effort: Pulmonary effort is normal. No respiratory distress.      Breath sounds: Normal breath sounds. No wheezing.   Abdominal:      General: Abdomen is flat.      Palpations: Abdomen is soft.   Musculoskeletal: Normal range of motion.         General: Tenderness (Left shoulder.  Pain 7 out of 10) present.      Comments: Gait stable.  Noted range of motion to left upper extremity/shoulder.   Skin:     General: Skin is warm and dry.   Neurological:      General: No focal deficit present.      Mental Status: She is alert and oriented to person, place, and time.   Psychiatric:         Mood and Affect: Mood  normal.         Behavior: Behavior normal.         Thought Content: Thought content normal.           Objective:   Visit Vitals  Visit Vitals  BP 118/70   Pulse 80   Temp (!) 96.2 ??F (35.7 ??C) (Temporal)   Resp 16   Ht 5' 2.5" (1.588 m)   Wt 311 lb 3.2 oz (141.2 kg)   SpO2 100%   BMI 56.01 kg/m??     The physical exam is unremarkable. She appears well, alert and oriented, pleasant and cooperative. Vitals as noted. Lungs are clear to auscultation. Heart sounds are normal.  She is asymptomatic of the first-degree heart block.  Abdomen is soft, no tenderness, masses or organomegaly.     Labs (CBC and BMP disease, EKG (first-degree heart block) and medical clearance has been obtained.    Assessment/Plan:       ICD-10-CM ICD-9-CM    1. Preoperative clearance  Z01.818 V72.84    2. First degree heart block by electrocardiogram  I44.0 426.11 REFERRAL TO CARDIOLOGY   3. Morbid obesity (HCC)  E66.01 278.01      Felt to be average risk for the planned procedure.  No contraindications noted.  Routine post prophylaxis as per protocol. Cover with SSS while in house. Patient may proceed with planned surgery. All questions to be answered by surgeon or their staff, risks and benefits to be explained and consent obtained via surgeon and staff.     Did discuss with patient in reference to her EKG results revealing first-degree heart block.  Explained to patient there was no treatment needed at this time.  Patient would like to consult (get a baseline) with a cardiologist after her surgery in order to establish care for work-up or treatment if necessary.  I agree and will refer to Dr. Charisse Klinefelter.    Total time: 22 minutes spent with the patient on counseling, answering questions, and/or coordination of care.       Dr Gaspar Bidding, DNP  [facility]

## 2019-04-26 ENCOUNTER — Ambulatory Visit
Admit: 2019-04-26 | Discharge: 2019-04-26 | Payer: MEDICARE | Attending: Physician Assistant | Primary: Nurse Practitioner

## 2019-04-26 ENCOUNTER — Ambulatory Visit: Attending: Physician Assistant | Primary: Nurse Practitioner

## 2019-04-26 DIAGNOSIS — M75102 Unspecified rotator cuff tear or rupture of left shoulder, not specified as traumatic: Secondary | ICD-10-CM

## 2019-04-26 MED ORDER — RIVAROXABAN 10 MG TAB
10 mg | ORAL_TABLET | Freq: Every day | ORAL | 0 refills | Status: DC
Start: 2019-04-26 — End: 2019-06-05

## 2019-04-26 MED ORDER — PREGABALIN 75 MG CAP
75 mg | ORAL_CAPSULE | Freq: Two times a day (BID) | ORAL | 0 refills | Status: DC
Start: 2019-04-26 — End: 2019-06-05

## 2019-04-26 MED ORDER — OXYCODONE 5 MG TAB
5 mg | ORAL_TABLET | ORAL | 0 refills | Status: AC | PRN
Start: 2019-04-26 — End: 2019-05-03

## 2019-04-26 NOTE — H&P (Signed)
HISTORY AND PHYSICAL          Patient: Tiffany [name]                MRN: [id number]       SSN: [id number]  Date of Birth: Mar 26, 1970          AGE: 49 y.o.          SEX: female      Patient scheduled for:  Left shoulder arthroscopic rotator cuff repair    Surgeon: Clelia Schaumann MD    ANESTHESIA TYPE:  General    HISTORY:     The patient was seen in the office today for a preoperative history and physical for an upcoming above listed surgery.  The patient is a pleasant 49 y.o. female who has a history of left shoulder pain.Pain has been present??since August initially.??Pt has fallen 3 times since August. No previous treatment. Has significantly limited ROM. Having night pain. Has hx of surgery on the lumbar spine. Has hx of blood clots, had a filter placed. Not currently on a blood thinner.  Pain level is a 8/10.    Due to the current findings, affected activity of daily living and continued pain and discomfort, surgical intervention is indicated. The alternatives, risks, and complications, including but not limited to infection, blood loss, need for blood transfusion, neurovascular damage, peri-incisional numbness, subcutaneous hematoma, bone fracture, anesthetic complications, DVT, PE, death, RSD, postoperative stiffness and pain, possible surgical scar, delayed healing and nonhealing, reflexive sympathetic dystrophy, damage to blood vessels and nerves, need for more surgery, MI, and stroke,  failure of hardware, gait disturbances,have been discussed.  The patient understands and wishes to proceed with surgery.     PAST MEDICAL HISTORY:     Past Medical History:   Diagnosis Date   ??? Arthritis    ??? Chronic constipation 07/25/2018    Dr Donovan Kail   ??? 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 Chronic pain of right knee 07/25/2018     Dr Vinson Moselle, ortho   ??? Constipation due to opioid therapy 07/25/2018   ??? DVT (deep venous thrombosis) ([place]) 2017    Left groin; Dr Sallee Provencal, vascular,  places IVF preop   ??? Dysphagia     Dr Luster Landsberg   ??? Esophageal obstruction     Dr Luster Landsberg   ??? History of fall 09/05/2018    see brief documentation on 09/05/2018   ??? Insomnia 07/25/2018   ??? Morbid obesity ([place]) 03/11/2016   ??? Spasm of back muscles 07/25/2018   ??? Status post right knee replacement 07/25/2018     Dr Vinson Moselle, ortho       CURRENT MEDICATIONS:     Current Outpatient Medications   Medication Sig Dispense Refill   ??? celecoxib (CeleBREX) 200 mg capsule Take 1 Cap by mouth two (2) times daily (with meals). 60 Cap 2   ??? DULoxetine (CYMBALTA) 60 mg capsule Take 1 Cap by mouth daily. 30 Cap 1   ??? oxybutynin (DITROPAN) 5 mg tablet Take 1 Tab by mouth two (2) times a day. Indications: a condition where the urge to urinate results in urine leakage 180 Tab 0   ??? cyclobenzaprine (FLEXERIL) 10 mg tablet Take 1 Tab by mouth two (2) times daily as needed for Muscle Spasm(s). Indications: muscle spasm 60 Tab 2   ??? traZODone (DESYREL) 100 mg tablet Take 1 Tab by mouth nightly. 90 Tab 0   ???  topiramate (Topamax) 25 mg tablet Take  by mouth two (2) times a day.     ??? bisacodyL (Dulcolax, bisacodyl,) 5 mg EC tablet Take 5 mg by mouth daily as needed for Constipation.         ALLERGIES:     Allergies   Allergen Reactions   ??? Darvocet A500 [Propoxyphene N-Acetaminophen] Hives         SURGICAL HISTORY:     Past Surgical History:   Procedure Laterality Date   ??? COLONOSCOPY N/A 10/12/2017    COLONOSCOPY performed by Alferd Patee, MD at [facility]   ??? COLONOSCOPY N/A 10/12/2018    COLONOSCOPY performed by Alferd Patee, MD at [facility] ENDOSCOPY   ??? HX ADENOIDECTOMY     ??? HX CHOLECYSTECTOMY  2013   ??? HX COLONOSCOPY  1999    screening - neg results per patient   ??? HX ENDOSCOPY      screening - neg results per patient   ??? HX ENDOSCOPY  10/12/2018    Dr Judie Grieve, [facility]   ??? HX GYN      ectopic preg with tube rupture   ??? HX KNEE REPLACEMENT Right 12/2017     Dr Vinson Moselle, ortho   ??? HX LUMBAR FUSION  0000000    [id number] - complicated by vascular  injury   ??? HX ORTHOPAEDIC      left foot surgery - stress fx   ??? HX ORTHOPAEDIC      L &R carpal tunnel   ??? HX TONSILLECTOMY     ??? IR IVC FILTER  2017    removed June 2017 noted in care everywhere   ??? PR ABDOMEN SURGERY PROC UNLISTED  2017    Open abdomen management (14 days) after vascular injury during lumbar fusion   ??? VASCULAR SURGERY PROCEDURE UNLIST      vascular repair of aortic/venocaval repair.        SOCIAL HISTORY:     Social History     Socioeconomic History   ??? Marital status: MARRIED     Spouse name: Not on file   ??? Number of children: Not on file   ??? Years of education: Not on file   ??? Highest education level: Not on file   Tobacco Use   ??? Smoking status: Never Smoker   ??? Smokeless tobacco: Never Used   Substance and Sexual Activity   ??? Alcohol use: No   ??? Drug use: No   ??? Sexual activity: Yes     Partners: Male       FAMILY HISTORY:     Family History   Problem Relation Age of Onset   ??? Hypertension Mother    ??? Heart Disease Father    ??? Hypertension Father        REVIEW OF SYSTEMS:     Negative for fevers, chills, chest pain, shortness of breath, weight loss, recent illness     General: Negative for fever and chills. No unexpected change in weight.  Denies fatigue. No change in appetite.   Skin: Negative for rash or itching.   HEENT: Negative for congestion, sore throat, neck pain and neck stiffness. No change in vision or hearing. Hasn't noted any enlarged lymph nodes in the neck.  Cardiovascular:  Negative for chest pain and palpitations.  Has not noted pedal edema.  Respiratory: Negative for cough, colds, sinus, hemoptysis, shortness of breath and wheezing.  Gastrointestinal: Negative for nausea and vomiting, rectal bleeding, coffee ground emesis, abdominal pain,  diarrhea and constipation.   Genitourinary: Negative for dysuria, frequency urgency, or burning on micturition. No flank pain, no foul smelling urine, no difficulty with initiating urination.   Hematological: Negative for bleeding or easy  bruising.  Musculoskeletal: Negative  for arthralgias, back pain or neck pain.  Neurological: Negative for dizziness, seizures or syncopal episodes. Denies headaches.   Endocrine: Denies excessive thirst.  No heat/cold intolerance.  Psychiatric: Negative for depression or insomnia.    PHYSICAL EXAMINATION:     VITALS: There were no vitals taken for this visit.  GEN:  Well developed, well nourished 49 y.o. female in no acute distress.   HEENT: Normocephalic and atraumatic.Eyes: Conjunctivae and EOM are normal.Pupils are equal, round, and reactive to light. External ear normal appearance, external nose normal appearing. Mouth/Throat: Oropharynx is clear and moist, able to handle oral secretions w/out difficulty, airway patent  NECK: Supple. Normal ROM, No lymphadenopathy. Trachea is midline. No bruising, swelling or deformity  RESP: Clear to auscultation bilaterally. No wheezes, rales, rhonchi. Normal effort and breath sounds. No respiratory distress  CARDIO:  Normal rate, regular rhythm and normal heart sounds. No MGR.  ABDOMEN: Soft, non-tender, non-distended, normoactive bowel sounds in all four quadrants. There is no tenderness. There is no rebound and no guarding.   BACK: No CVA or spinal tenderness  BREAST:  Deferred  PELVIC:    Deferred   RECTAL:  Deferred   GU:           Deferred  EXTREMITIES: EXAMINATION OF:  Examination Left shoulder   Skin Intact   AC joint tenderness -   Biceps tenderness -   Forward flexion/Elevation ROM 120   Active abduction ROM 120   Glenohumeral abduction 90   External rotation ROM 45   Internal rotation ROM 30   Apprehension -   Jobe???s Relocation -   Jerk -   Load and Shift -   O???briens -   Speeds -   Impingement sign +   Supraspinatus/Empty Can -, 5/5   External Rotation Strength -, 5/5   Lift Off/Belly Press -, 5/5   Neurovascular Intact   ??        NEUROVASCULAR: Sensation intact to light touch and strength grossly intact and symmetrical. No nystagmus. Positive distal pulses and  capillary refill.   DVT ASSESSMENT:  There is not  calf tenderness. No evidence of DVT seen on physical exam.  MOTOR: In tact  PSYCH: Alert an oriented to person, place and time. Mood, memory, affect, behavior and judgment normal       RADIOGRAPHS & DIAGNOSTIC STUDIES:     MRI/xray reveals : IMAGING: MRI of left shoulder dated 03/30/2019 was reviewed and read by Dr. Domingo Pulse:   IMPRESSION:  1. High-grade partial-thickness articular sided tear of the central/posterior  supraspinatus tendon insertion. There is also mild infraspinatus and moderate subscapularis insertional tendinosis.  2. Well-corticated os acromiale with synchondrosis fluid and marrow edema, suggesting instability.  3. 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 Minimal subacromial-subdeltoid bursal fluid.  ??  ??  XR of left shoulder with 3 views obtained in the office??dated 03/19/2019??was reviewed and read by Dr. Domingo Pulse: Sclerotic changes in the greater tuberosity.      LABS:       @  CBC:   Lab Results   Component Value Date/Time    WBC 6.4 04/19/2019 01:41 PM    RBC 4.22 04/19/2019 01:41 PM    HGB  12.2 04/19/2019 01:41 PM    HCT 37.4 04/19/2019 01:41 PM    PLATELET 336 04/19/2019 01:41 PM    and BMP:   Lab Results   Component Value Date/Time    Glucose 105 (H) 04/19/2019 01:41 PM    Sodium 139 04/19/2019 01:41 PM    Potassium 4.0 04/19/2019 01:41 PM    Chloride 109 04/19/2019 01:41 PM    CO2 27 04/19/2019 01:41 PM    BUN 12 04/19/2019 01:41 PM    Creatinine 0.86 04/19/2019 01:41 PM    Calcium 9.0 04/19/2019 01:41 PM   @    Preoperative labs were reviewed and are substantially within normal limits   EKG: 1st degree AV block.       ASSESSMENT:       Encounter Diagnosis   Name Primary?   ??? Tear of left rotator cuff, unspecified tear extent, unspecified whether traumatic Yes       PLAN:     Again, the alternatives, risks, and complications, as well as  expected outcome were discussed. The patient understands and agrees to proceed with left shoulder arthroscopic rotator cuff repair. She has been medically cleared. The patient was counseled at length about the risks of contracting Covid-19 during their perioperative period and any recovery window from their procedure.  The patient was made aware that contracting Covid-19  may worsen their prognosis for recovering from their procedure and lend to a higher morbidity and/or mortality risk.  All material risks, benefits, and reasonable alternatives including postponing the procedure were discussed. The patient does  wish to proceed with the procedure at this time.   Patient given orders listed below:    No orders of the defined types were placed in this encounter.        Rogelia Boga, PA-C  04/26/2019  11:14 AM

## 2019-04-26 NOTE — Patient Instructions (Signed)
Dr. Luciano Arthroscopic Rotator Cuff Repair    What is the surgery?  - This is an outpatient procedure at either [facility] or  Hospital  - You will be completely asleep for the procedure. Dr. Luciano will make somewhere between 2-5 small incisions in your shoulder depending on the amount of work to be completed. He will take a tour of your shoulder with the camera and fix everything that needs to be fixed during your surgery.  - Total surgery takes about 45 mins to an hour and half depending on how much needed to be repaired    What can you expect after surgery?  - You will have a bulky dressing on your shoulder that you can remove 2 days after surgery. You will be able to shower 2 days after surgery but no soaking in a bath, hot tub, ocean or pool x 2 weeks to allow for full wound healing  - You will be in a sling for 5-6 weeks depending on your repair. You will wear this sling whenever you are active, up moving around, and sleeping at night. This sling is to keep you from moving your arm on your own. You are essentially one armed until you are out of your sling. This means no reaching, pulling, grabbing or lifting with the operative arm.   - Dr. Luciano will start physical therapy for you the first business day after surgery. While you cannot move your arm we allow physical therapy to gently move your shoulder. We call this passive range of motion. The goal of this is to decrease your stiffness and in turn decrease your post operative pain.   - Plan on being in physical therapy for 10-12 weeks    When can I return to work?  - Most patients return to desk work only after 2 weeks. You are able to type but there is no overhead work or lifting  - You will start some gentle lifting up to 5-10lbs at about 8-10 weeks post operatively. You will gradually increase how much you are able to lift after this point under the guidance of Dr. Luciano, his physician assistant and physical  therapy.  - At 6 months you are able to do all activities as tolerated but it may take you a full 9-12 months to fully recover from your surgery    Not all shoulder arthroscopies are the same. The specifics of your individual case will be discussed at length with you by Dr. Luciano and his Physician Assistant.        Martha Whitlow  Surgical Coordinator  [address] [place] [address]  [email or web address]  P: [phone]  F: [phone]

## 2019-04-26 NOTE — Progress Notes (Deleted)
HISTORY AND PHYSICAL          Patient: Tiffany [name]                MRN: [id number]       SSN: [id number]  Date of Birth: [birth year]          AGE: 49 y.o.          SEX: female      Patient scheduled for:  Left shoulder arthroscopic rotator cuff repair    Surgeon: Clelia Schaumann MD    ANESTHESIA TYPE:  General    HISTORY:     The patient was seen in the office today for a preoperative history and physical for an upcoming above listed surgery.  The patient is a pleasant 49 y.o. female who has a history of left shoulder pain.Pain has been present??since August initially.??Pt has fallen 3 times since August. No previous treatment. Has significantly limited ROM. Having night pain. Has hx of surgery on the lumbar spine. Has hx of blood clots, had a filter placed. Not currently on a blood thinner.  . Pain level is a ***/10.    Due to the current findings, affected activity of daily living and continued pain and discomfort, surgical intervention is indicated. The alternatives, risks, and complications, including but not limited to infection, blood loss, need for blood transfusion, neurovascular damage, peri-incisional numbness, subcutaneous hematoma, bone fracture, anesthetic complications, DVT, PE, death, RSD, postoperative stiffness and pain, possible surgical scar, delayed healing and nonhealing, reflexive sympathetic dystrophy, damage to blood vessels and nerves, need for more surgery, MI, and stroke,  failure of hardware, gait disturbances,have been discussed.  The patient understands and wishes to proceed with surgery.     PAST MEDICAL HISTORY:     Past Medical History:   Diagnosis Date   ??? Arthritis    ??? Chronic constipation 07/25/2018    Dr Donovan Kail   ??? 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 Chronic pain of right knee 07/25/2018     Dr Vinson Moselle, ortho   ??? Constipation due to opioid therapy 07/25/2018   ??? DVT (deep venous thrombosis) ([place]) 2017    Left groin; Dr Sallee Provencal,  vascular, places IVF preop   ??? Dysphagia     Dr Luster Landsberg   ??? Esophageal obstruction     Dr Luster Landsberg   ??? History of fall 09/05/2018    see brief documentation on 09/05/2018   ??? Insomnia 07/25/2018   ??? Morbid obesity ([place]) 03/11/2016   ??? Spasm of back muscles 07/25/2018   ??? Status post right knee replacement 07/25/2018     Dr Vinson Moselle, ortho       CURRENT MEDICATIONS:     Current Outpatient Medications   Medication Sig Dispense Refill   ??? celecoxib (CeleBREX) 200 mg capsule Take 1 Cap by mouth two (2) times daily (with meals). 60 Cap 2   ??? DULoxetine (CYMBALTA) 60 mg capsule Take 1 Cap by mouth daily. 30 Cap 1   ??? oxybutynin (DITROPAN) 5 mg tablet Take 1 Tab by mouth two (2) times a day. Indications: a condition where the urge to urinate results in urine leakage 180 Tab 0   ??? cyclobenzaprine (FLEXERIL) 10 mg tablet Take 1 Tab by mouth two (2) times daily as needed for Muscle Spasm(s). Indications: muscle spasm 60 Tab 2   ??? traZODone (DESYREL) 100 mg tablet Take 1 Tab by mouth nightly. 90 Tab 0   ???  topiramate (Topamax) 25 mg tablet Take  by mouth two (2) times a day.     ??? bisacodyL (Dulcolax, bisacodyl,) 5 mg EC tablet Take 5 mg by mouth daily as needed for Constipation.         ALLERGIES:     Allergies   Allergen Reactions   ??? Darvocet A500 [Propoxyphene N-Acetaminophen] Hives         SURGICAL HISTORY:     Past Surgical History:   Procedure Laterality Date   ??? COLONOSCOPY N/A 10/12/2017    COLONOSCOPY performed by Alferd Patee, MD at [facility]   ??? COLONOSCOPY N/A 10/12/2018    COLONOSCOPY performed by [facility]ferd Patee, MD at [facility] ENDOSCOPY   ??? HX ADENOIDECTOMY     ??? HX CHOLECYSTECTOMY  2013   ??? HX COLONOSCOPY  1999    screening - neg results per patient   ??? HX ENDOSCOPY      screening - neg results per patient   ??? HX ENDOSCOPY  10/12/2018    Dr Judie Grieve, [facility]   ??? HX GYN      ectopic preg with tube rupture   ??? HX KNEE REPLACEMENT Right 12/2017     Dr Vinson Moselle, ortho   ??? HX LUMBAR FUSION  0000000    [id number] - complicated by  vascular injury   ??? HX ORTHOPAEDIC      left foot surgery - stress fx   ??? HX ORTHOPAEDIC      L &R carpal tunnel   ??? HX TONSILLECTOMY     ??? IR IVC FILTER  2017    removed June 2017 noted in care everywhere   ??? PR ABDOMEN SURGERY PROC UNLISTED  2017    Open abdomen management (14 days) after vascular injury during lumbar fusion   ??? VASCULAR SURGERY PROCEDURE UNLIST      vascular repair of aortic/venocaval repair.        SOCIAL HISTORY:     Social History     Socioeconomic History   ??? Marital status: MARRIED     Spouse name: Not on file   ??? Number of children: Not on file   ??? Years of education: Not on file   ??? Highest education level: Not on file   Tobacco Use   ??? Smoking status: Never Smoker   ??? Smokeless tobacco: Never Used   Substance and Sexual Activity   ??? Alcohol use: No   ??? Drug use: No   ??? Sexual activity: Yes     Partners: Male       FAMILY HISTORY:     Family History   Problem Relation Age of Onset   ??? Hypertension Mother    ??? Heart Disease Father    ??? Hypertension Father        REVIEW OF SYSTEMS:     Negative for fevers, chills, chest pain, shortness of breath, weight loss, recent illness     General: Negative for fever and chills. No unexpected change in weight.  Denies fatigue. No change in appetite.   Skin: Negative for rash or itching.   HEENT: Negative for congestion, sore throat, neck pain and neck stiffness. No change in vision or hearing. Hasn't noted any enlarged lymph nodes in the neck.  Cardiovascular:  Negative for chest pain and palpitations.  Has not noted pedal edema.  Respiratory: Negative for cough, colds, sinus, hemoptysis, shortness of breath and wheezing.  Gastrointestinal: Negative for nausea and vomiting, rectal bleeding, coffee ground emesis, abdominal pain,  diarrhea and constipation.   Genitourinary: Negative for dysuria, frequency urgency, or burning on micturition. No flank pain, no foul smelling urine, no difficulty with initiating urination.   Hematological: Negative for  bleeding or easy bruising.  Musculoskeletal: Negative  for arthralgias, back pain or neck pain.  Neurological: Negative for dizziness, seizures or syncopal episodes. Denies headaches.   Endocrine: Denies excessive thirst.  No heat/cold intolerance.  Psychiatric: Negative for depression or insomnia.    PHYSICAL EXAMINATION:     VITALS: There were no vitals taken for this visit.  GEN:  Well developed, well nourished 49 y.o. female in no acute distress.   HEENT: Normocephalic and atraumatic.Eyes: Conjunctivae and EOM are normal.Pupils are equal, round, and reactive to light. External ear normal appearance, external nose normal appearing. Mouth/Throat: Oropharynx is clear and moist, able to handle oral secretions w/out difficulty, airway patent  NECK: Supple. Normal ROM, No lymphadenopathy. Trachea is midline. No bruising, swelling or deformity  RESP: Clear to auscultation bilaterally. No wheezes, rales, rhonchi. Normal effort and breath sounds. No respiratory distress  CARDIO:  Normal rate, regular rhythm and normal heart sounds. No MGR.  ABDOMEN: Soft, non-tender, non-distended, normoactive bowel sounds in all four quadrants. There is no tenderness. There is no rebound and no guarding.   BACK: No CVA or spinal tenderness  BREAST:  Deferred  PELVIC:    Deferred   RECTAL:  Deferred   GU:           Deferred  EXTREMITIES: EXAMINATION OF:  Examination Left shoulder   Skin Intact   AC joint tenderness -   Biceps tenderness -   Forward flexion/Elevation ROM 120   Active abduction ROM 120   Glenohumeral abduction 90   External rotation ROM 45   Internal rotation ROM 30   Apprehension -   Jobe???s Relocation -   Jerk -   Load and Shift -   O???briens -   Speeds -   Impingement sign +   Supraspinatus/Empty Can -, 5/5   External Rotation Strength -, 5/5   Lift Off/Belly Press -, 5/5   Neurovascular Intact   ??        NEUROVASCULAR: Sensation intact to light touch and strength grossly intact and symmetrical. No nystagmus. Positive  distal pulses and capillary refill.   DVT ASSESSMENT:  There is not  calf tenderness. No evidence of DVT seen on physical exam.  MOTOR: In tact  PSYCH: Alert an oriented to person, place and time. Mood, memory, affect, behavior and judgment normal       RADIOGRAPHS & DIAGNOSTIC STUDIES:     MRI/xray reveals : IMAGING: MRI of left shoulder dated 03/30/2019 was reviewed and read by Dr. Domingo Pulse:   IMPRESSION:  1. High-grade partial-thickness articular sided tear of the central/posterior  supraspinatus tendon insertion. There is also mild infraspinatus and moderate subscapularis insertional tendinosis.  2. Well-corticated os acromiale with synchondrosis fluid and marrow edema, suggesting instability.  3. 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 Minimal subacromial-subdeltoid bursal fluid.  ??  ??  XR of left shoulder with 3 views obtained in the office??dated 03/19/2019??was reviewed and read by Dr. Domingo Pulse: Sclerotic changes in the greater tuberosity.      LABS:       @  CBC:   Lab Results   Component Value Date/Time    WBC 6.4 04/19/2019 01:41 PM    RBC 4.22 04/19/2019 01:41 PM    HGB  12.2 04/19/2019 01:41 PM    HCT 37.4 04/19/2019 01:41 PM    PLATELET 336 04/19/2019 01:41 PM    and BMP:   Lab Results   Component Value Date/Time    Glucose 105 (H) 04/19/2019 01:41 PM    Sodium 139 04/19/2019 01:41 PM    Potassium 4.0 04/19/2019 01:41 PM    Chloride 109 04/19/2019 01:41 PM    CO2 27 04/19/2019 01:41 PM    BUN 12 04/19/2019 01:41 PM    Creatinine 0.86 04/19/2019 01:41 PM    Calcium 9.0 04/19/2019 01:41 PM   @    Preoperative labs were reviewed and are substantially within normal limits   EKG: 1st degree AV block.       ASSESSMENT:       Encounter Diagnosis   Name Primary?   ??? Tear of left rotator cuff, unspecified tear extent, unspecified whether traumatic Yes       PLAN:     Again, the alternatives, risks, and  complications, as well as expected outcome were discussed. The patient understands and agrees to proceed with left shoulder arthroscopic rotator cuff repair. She has been medically cleared. The patient was counseled at length about the risks of contracting Covid-19 during their perioperative period and any recovery window from their procedure.  The patient was made aware that contracting Covid-19  may worsen their prognosis for recovering from their procedure and lend to a higher morbidity and/or mortality risk.  All material risks, benefits, and reasonable alternatives including postponing the procedure were discussed. The patient does  wish to proceed with the procedure at this time.   Patient given orders listed below:    No orders of the defined types were placed in this encounter.        Rogelia Boga, PA-C  04/26/2019  11:14 AM

## 2019-04-26 NOTE — Progress Notes (Signed)
History and Physical Performed today and documented in chart    Rogelia Boga, PA-C  04/26/2019

## 2019-04-26 NOTE — H&P (Signed)
H&P by Rogelia Boga, PA at 04/26/19 1330                Author: Rogelia Boga, PA  Service: --  Author Type: Physician Assistant       Filed: 04/26/19 1340  Encounter Date: 04/26/2019  Status: Signed          Editor: Rogelia Boga, PA (Physician Assistant)                             HISTORY AND PHYSICAL               Patient: Tiffany [name]                MRN:  [id number]       SSN: [id number]   Date of Birth: February 01, 1971          AGE:  49 y.o.          SEX: female         Patient scheduled for:  Left shoulder arthroscopic rotator cuff repair     Surgeon: Clelia Schaumann MD      ANESTHESIA TYPE:  General        HISTORY:        The patient was seen in the office today for a preoperative history and physical for an upcoming above listed surgery.  The patient is a pleasant 49 y.o. female who has a history of left shoulder pain. Pain has been present??since August initially.??Pt has fallen 3 times since August. No previous treatment. Has significantly limited ROM. Having night pain. Has hx of surgery on the lumbar spine. Has hx of blood clots, had a filter placed. Not currently  on a blood thinner.  Pain level is a 8/10.      Due to the current findings, affected activity of daily living and continued pain and discomfort, surgical intervention is indicated. The alternatives, risks, and complications, including but not limited to infection, blood loss, need for blood transfusion,  neurovascular damage, peri-incisional numbness, subcutaneous hematoma, bone fracture, anesthetic complications, DVT, PE, death, RSD, postoperative stiffness and pain, possible surgical scar, delayed healing and nonhealing, reflexive sympathetic dystrophy,  damage to blood vessels and nerves, need for more surgery, MI, and stroke,  failure of hardware, gait disturbances,have been discussed.  The patient understands and wishes to proceed with surgery.         PAST MEDICAL HISTORY:          Past Medical History:        Diagnosis   Date         ?  Arthritis       ?  Chronic constipation  07/25/2018          Dr Donovan Kail         ?  Chronic low back pain with sciatica  07/25/2018          Dr Berna Spare mgt         ?  Chronic pain of right knee  07/25/2018           Dr Vinson Moselle, ortho         ?  Constipation due to opioid therapy  07/25/2018     ?  DVT (deep venous thrombosis) ([place])  2017          Left groin; Dr Sallee Provencal, vascular, places IVF preop         ?  Dysphagia            Dr Luster Landsberg         ?  Esophageal obstruction            Dr Donovan Kail [facility]         ?  History of fall  09/05/2018          see brief documentation on 09/05/2018         ?  Insomnia  07/25/2018     ?  Morbid obesity ([place])  03/11/2016     ?  Spasm of back muscles  07/25/2018     ?  Status post right knee replacement  07/25/2018           Dr Vinson Moselle, ortho             CURRENT MEDICATIONS:          Current Outpatient Medications          Medication  Sig  Dispense  Refill           ?  celecoxib (CeleBREX) 200 mg capsule  Take 1 Cap by mouth two (2) times daily (with meals).  60 Cap  2     ?  DULoxetine (CYMBALTA) 60 mg capsule  Take 1 Cap by mouth daily.  30 Cap  1     ?  oxybutynin (DITROPAN) 5 mg tablet  Take 1 Tab by mouth two (2) times a day. Indications: a condition where the urge to urinate results in urine leakage  180 Tab  0     ?  cyclobenzaprine (FLEXERIL) 10 mg tablet  Take 1 Tab by mouth two (2) times daily as needed for Muscle Spasm(s). Indications: muscle spasm  60 Tab  2     ?  traZODone (DESYREL) 100 mg tablet  Take 1 Tab by mouth nightly.  90 Tab  0     ?  topiramate (Topamax) 25 mg tablet  Take  by mouth two (2) times a day.               ?  bisacodyL (Dulcolax, bisacodyl,) 5 mg EC tablet  Take 5 mg by mouth daily as needed for Constipation.                 ALLERGIES:          Allergies        Allergen  Reactions         ?  Darvocet A500 [Propoxyphene N-Acetaminophen]  Hives                SURGICAL HISTORY:          Past Surgical History:         Procedure   Laterality  Date          ?  COLONOSCOPY  N/A  10/12/2017          COLONOSCOPY performed by Alferd Patee, MD at [facility] ENDOSCOPY          ?  COLONOSCOPY  N/A  10/12/2018          COLONOSCOPY performed by Alferd Patee, MD at [facility] ENDOSCOPY          ?  HX ADENOIDECTOMY         ?  HX CHOLECYSTECTOMY    2013     ?  HX COLONOSCOPY    1999          screening - neg results  per patient          ?  HX ENDOSCOPY              screening - neg results per patient          ?  HX ENDOSCOPY    10/12/2018          Dr Judie Grieve, [place]          ?  HX GYN              ectopic preg with tube rupture          ?  HX KNEE REPLACEMENT  Right  12/2017           Dr Vinson Moselle, ortho          ?  HX LUMBAR FUSION    2017          [id number] - complicated by vascular injury          ?  HX ORTHOPAEDIC              left foot surgery - stress fx          ?  HX ORTHOPAEDIC              L &R carpal tunnel          ?  HX TONSILLECTOMY         ?  IR IVC FILTER    2017          removed June 2017 noted in care everywhere          ?  PR ABDOMEN SURGERY PROC UNLISTED    2017          Open abdomen management (14 days) after vascular injury during lumbar fusion          ?  VASCULAR SURGERY PROCEDURE UNLIST              vascular repair of aortic/venocaval repair.              SOCIAL HISTORY:          Social History          Socioeconomic History         ?  Marital status:  MARRIED              Spouse name:  Not on file         ?  Number of children:  Not on file     ?  Years of education:  Not on file     ?  Highest education level:  Not on file       Tobacco Use         ?  Smoking status:  Never Smoker     ?  Smokeless tobacco:  Never Used       Substance and Sexual Activity         ?  Alcohol use:  No     ?  Drug use:  No     ?  Sexual activity:  Yes              Partners:  Male             FAMILY HISTORY:          Family History         Problem  Relation  Age of Onset          ?  Hypertension  Mother       ?  Heart Disease  Father            ?  Hypertension  Father                REVIEW OF SYSTEMS:        Negative for fevers, chills, chest pain, shortness of breath, weight loss, recent illness       General: Negative for fever and chills. No unexpected change in weight.  Denies fatigue. No change in appetite.    Skin: Negative for rash or itching.    HEENT: Negative for congestion, sore throat, neck pain and neck stiffness. No change in vision or hearing. Hasn't noted any enlarged lymph  nodes in the neck.   Cardiovascular:  Negative for chest pain and palpitations.  Has not noted pedal edema.   Respiratory: Negative for cough, colds, sinus, hemoptysis, shortness of breath and wheezing.   Gastrointestinal: Negative for nausea and vomiting, rectal bleeding, coffee ground emesis, abdominal pain, diarrhea and constipation.    Genitourinary: Negative for dysuria, frequency urgency, or burning on micturition. No flank pain, no foul smelling urine, no difficulty with  initiating urination.    Hematological: Negative for  bleeding or easy bruising.   Musculoskeletal: Negative  for arthralgias, back pain or neck pain.   Neurological: Negative for dizziness, seizures or syncopal episodes. Denies headaches.    Endocrine: Denies excessive thirst.  No heat/cold intolerance.   Psychiatric: Negative for depression or insomnia.        PHYSICAL EXAMINATION:        VITALS: There were no vitals taken for this visit.   GEN:  Well developed, well nourished 49 y.o. female in no acute distress.    HEENT: Normocephalic and atraumatic.Eyes: Conjunctivae and EOM are normal.Pupils are equal, round, and reactive to light. External ear normal  appearance, external nose normal appearing. Mouth/Throat: Oropharynx is clear and moist, able to handle oral secretions w/out difficulty, airway patent   NECK: Supple. Normal ROM, No lymphadenopathy. Trachea is midline. No bruising, swelling or deformity   RESP: Clear to auscultation bilaterally. No wheezes, rales, rhonchi. Normal effort and breath sounds. No respiratory  distress   CARDIO:  Normal rate, regular rhythm and normal heart sounds. No MGR.   ABDOMEN: Soft, non-tender, non-distended, normoactive bowel sounds in all four quadrants. There is no tenderness. There is no rebound and no  guarding.    BACK: No CVA or spinal tenderness   BREAST:  Deferred   PELVIC:    Deferred    RECTAL:  Deferred    GU:           Deferred   EXTREMITIES: EXAMINATION OF:      Examination  Left shoulder     Skin  Intact     AC joint tenderness  -     Biceps tenderness  -     Forward flexion/Elevation ROM  120     Active abduction ROM  120     Glenohumeral abduction  90     External rotation ROM  45     Internal rotation ROM  30     Apprehension  -     Jobes Relocation  -     Jerk  -     Load and Shift  -     Obriens  -     Speeds  -     Impingement sign  +     Supraspinatus/Empty Can  -, 5/5     External Rotation Strength  -,  5/5     Lift Off/Belly Press  -, 5/5     Neurovascular  Intact     ??           NEUROVASCULAR: Sensation intact to light touch and strength grossly intact and symmetrical. No nystagmus. Positive distal pulses and capillary refill.    DVT ASSESSMENT:  There is not  calf tenderness. No evidence of DVT seen on physical exam.   MOTOR: In tact   PSYCH: Alert an oriented to person, place and time. Mood, memory, affect, behavior and judgment normal            RADIOGRAPHS & DIAGNOSTIC STUDIES:        MRI/xray reveals : IMAGING: MRI  of left shoulder dated 03/30/2019 was reviewed and read by Dr. Domingo Pulse:    IMPRESSION:   1. High-grade partial-thickness articular sided tear of the central/posterior   supraspinatus tendon insertion. There is also mild infraspinatus and moderate subscapularis insertional tendinosis.   2. Well-corticated os acromiale with synchondrosis fluid and marrow edema, suggesting instability.   3. 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 Minimal  subacromial-subdeltoid bursal fluid.   ??   ??   XR of left shoulder with 3 views obtained in the office??dated 03/19/2019??was reviewed and read by Dr. Domingo Pulse: Sclerotic changes in the greater tuberosity.           LABS:           @   CBC:      Lab Results         Component  Value  Date/Time            WBC  6.4  04/19/2019 01:41 PM       RBC  4.22  04/19/2019 01:41 PM       HGB  12.2  04/19/2019 01:41 PM       HCT  37.4  04/19/2019 01:41 PM            PLATELET  336  04/19/2019 01:41 PM      and BMP:      Lab Results         Component  Value  Date/Time            Glucose  105 (H)  04/19/2019 01:41 PM       Sodium  139  04/19/2019 01:41 PM       Potassium  4.0  04/19/2019 01:41 PM       Chloride  109  04/19/2019 01:41 PM       CO2  27  04/19/2019 01:41 PM       BUN  12  04/19/2019 01:41 PM       Creatinine  0.86  04/19/2019 01:41 PM            Calcium  9.0  04/19/2019 01:41 PM     @      Preoperative labs were reviewed and are substantially within normal limits    EKG: 1st degree AV block.            ASSESSMENT:             Encounter Diagnosis        Name  Primary?         ?  Tear of left rotator cuff, unspecified tear extent, unspecified whether traumatic  Yes             PLAN:  Again, the alternatives, risks, and complications, as well as expected outcome were discussed. The patient understands and agrees to proceed with left shoulder arthroscopic rotator cuff repair. She has been medically cleared.  The patient was counseled at length about the risks of contracting Covid-19 during their perioperative period and any recovery window from their procedure.  The patient was made aware that contracting Covid-19  may worsen their prognosis for recovering  from their procedure and lend to a higher morbidity and/or mortality risk.  All material risks, benefits, and reasonable alternatives including postponing the procedure were discussed. The patient does  wish to proceed with the procedure at this time.    Patient given  orders listed below:      No orders of the defined types were placed in this encounter.            Rogelia Boga, PA-C   04/26/2019   11:14 AM

## 2019-04-29 ENCOUNTER — Inpatient Hospital Stay: Admit: 2019-04-29 | Payer: MEDICARE | Primary: Nurse Practitioner

## 2019-04-29 DIAGNOSIS — Z01812 Encounter for preprocedural laboratory examination: Secondary | ICD-10-CM

## 2019-04-30 LAB — COVID-19: SARS-CoV-2: NOT DETECTED

## 2019-04-30 LAB — NOVEL CORONAVIRUS (COVID-19): SARS-CoV-2: NOT DETECTED

## 2019-05-01 ENCOUNTER — Ambulatory Visit
Admit: 2019-05-01 | Discharge: 2019-05-01 | Payer: MEDICARE | Attending: Cardiovascular Disease | Primary: Nurse Practitioner

## 2019-05-01 ENCOUNTER — Ambulatory Visit: Attending: Cardiovascular Disease | Primary: Nurse Practitioner

## 2019-05-01 DIAGNOSIS — I440 Atrioventricular block, first degree: Secondary | ICD-10-CM

## 2019-05-01 NOTE — Progress Notes (Signed)
HISTORY OF PRESENT ILLNESS  Tiffany [name] is a 49 y.o. female.    New Patient  The history is provided by the patient. This is a chronic problem. The problem occurs every several days. The problem has not changed since onset.Associated symptoms include shortness of breath. Pertinent negatives include no chest pain, no abdominal pain and no headaches.   Shortness of Breath  The history is provided by the patient. This is a chronic problem. The problem occurs intermittently.The problem has not changed since onset.Pertinent negatives include no fever, no headaches, no ear pain, no neck pain, no cough, no sputum production, no hemoptysis, no wheezing, no PND, no orthopnea, no chest pain, no vomiting, no abdominal pain, no rash, no leg swelling and no claudication.       Review of Systems   Constitutional: Negative for chills, diaphoresis, fever and weight loss.   HENT: Negative for ear pain and hearing loss.    Eyes: Negative for blurred vision.   Respiratory: Positive for shortness of breath. Negative for cough, hemoptysis, sputum production, wheezing and stridor.    Cardiovascular: Negative for chest pain, palpitations, orthopnea, claudication, leg swelling and PND.   Gastrointestinal: Negative for abdominal pain, heartburn, nausea and vomiting.   Musculoskeletal: Negative for myalgias and neck pain.   Skin: Negative for rash.   Neurological: Negative for dizziness, tingling, tremors, focal weakness, loss of consciousness, weakness and headaches.   Psychiatric/Behavioral: Negative for depression and suicidal ideas.     Family History   Problem Relation Age of Onset   ??? Hypertension Mother    ??? Heart Disease Father    ??? Hypertension Father    ??? Heart Disease Paternal Aunt    ??? Heart Disease Paternal Uncle    ??? Heart Disease Paternal Grandmother        Past Medical History:   Diagnosis Date   ??? Arthritis    ??? Chronic constipation 07/25/2018    Dr Donovan Kail   ??? Chronic low back pain with sciatica 07/25/2018    Dr  Francee Gentile   ??? Chronic pain of right knee 07/25/2018     Dr Vinson Moselle, ortho   ??? Constipation due to opioid therapy 07/25/2018   ??? DVT (deep venous thrombosis) ([place]) 2017    Left groin; Dr Sallee Provencal, vascular, places IVF preop   ??? Dysphagia     Dr Luster Landsberg   ??? Esophageal obstruction     Dr Luster Landsberg   ??? History of fall 09/05/2018    see brief documentation on 09/05/2018   ??? Insomnia 07/25/2018   ??? Morbid obesity ([place]) 03/11/2016   ??? Spasm of back muscles 07/25/2018   ??? Status post right knee replacement 07/25/2018     Dr Vinson Moselle, ortho       Past Surgical History:   Procedure Laterality Date   ??? COLONOSCOPY N/A 10/12/2017    COLONOSCOPY performed by Alferd Patee, MD at [place]   ??? COLONOSCOPY N/A 10/12/2018    COLONOSCOPY performed by [facility]ferd Patee, MD at [facility] ENDOSCOPY   ??? HX ADENOIDECTOMY     ??? HX CHOLECYSTECTOMY  2013   ??? HX COLONOSCOPY  1999    screening - neg results per patient   ??? HX ENDOSCOPY      screening - neg results per patient   ??? HX ENDOSCOPY  10/12/2018    Dr Judie Grieve, [facility]   ??? HX GYN      ectopic preg with tube rupture   ??? HX  KNEE REPLACEMENT Right 12/2017     Dr Vinson Moselle, ortho   ??? HX LUMBAR FUSION  0000000    [id number] - complicated by vascular injury   ??? HX ORTHOPAEDIC      left foot surgery - stress fx   ??? HX ORTHOPAEDIC      L &R carpal tunnel   ??? HX TONSILLECTOMY     ??? IR IVC FILTER  2017    removed June 2017 noted in care everywhere   ??? PR ABDOMEN SURGERY PROC UNLISTED  2017    Open abdomen management (14 days) after vascular injury during lumbar fusion   ??? VASCULAR SURGERY PROCEDURE UNLIST      vascular repair of aortic/venocaval repair.        Social History     Tobacco Use   ??? Smoking status: Never Smoker   ??? Smokeless tobacco: Never Used   Substance Use Topics   ??? Alcohol use: No       Allergies   Allergen Reactions   ??? Darvocet A500 [Propoxyphene N-Acetaminophen] Hives       Outpatient Medications Marked as Taking for the 05/01/19 encounter (Office Visit) with Fredirick Maudlin, MD    Medication Sig Dispense Refill   ??? oxyCODONE IR (ROXICODONE) 5 mg immediate release tablet Take 1 Tab by mouth every four (4) hours as needed for Pain for up to 7 days. Max Daily Amount: 30 mg. DO NOT TAKE UNTIL AFTER SURGERY 40 Tab 0   ??? pregabalin (Lyrica) 75 mg capsule Take 1 Cap by mouth two (2) times a day. Max Daily Amount: 150 mg. 10 Cap 0   ??? [START ON 05/04/2019] rivaroxaban (Xarelto) 10 mg tablet Take 1 Tab by mouth daily. 14 Tab 0   ??? celecoxib (CeleBREX) 200 mg capsule Take 1 Cap by mouth two (2) times daily (with meals). 60 Cap 2   ??? DULoxetine (CYMBALTA) 60 mg capsule Take 1 Cap by mouth daily. 30 Cap 1   ??? oxybutynin (DITROPAN) 5 mg tablet Take 1 Tab by mouth two (2) times a day. Indications: a condition where the urge to urinate results in urine leakage 180 Tab 0   ??? cyclobenzaprine (FLEXERIL) 10 mg tablet Take 1 Tab by mouth two (2) times daily as needed for Muscle Spasm(s). Indications: muscle spasm 60 Tab 2   ??? traZODone (DESYREL) 100 mg tablet Take 1 Tab by mouth nightly. 90 Tab 0   ??? topiramate (Topamax) 25 mg tablet Take  by mouth two (2) times a day.     ??? bisacodyL (Dulcolax, bisacodyl,) 5 mg EC tablet Take 5 mg by mouth daily as needed for Constipation.          Visit Vitals  BP 107/64   Pulse 88   Temp 98.1 ??F (36.7 ??C) (Temporal)   Ht 5' 3.5" (1.613 m)   Wt 139.7 kg (308 lb)   BMI 53.70 kg/m??       Physical Exam   Constitutional: She is oriented to person, place, and time. She appears well-developed and well-nourished.   Morbidly obese   HENT:   Head: Normocephalic and atraumatic.   Eyes: Conjunctivae and EOM are normal. No scleral icterus.   Neck: Normal range of motion. Neck supple. No JVD present.   Cardiovascular: Normal rate, regular rhythm and normal heart sounds. Exam reveals no gallop and no friction rub.   No murmur heard.  Pulmonary/Chest: Effort normal and breath sounds normal. No stridor. No respiratory distress. She has  no wheezes. She has no rales. She exhibits no tenderness.    Abdominal: She exhibits no distension. There is no abdominal tenderness.   Musculoskeletal: Normal range of motion.         General: No tenderness or edema.   Lymphadenopathy:     She has no cervical adenopathy.   Neurological: She is alert and oriented to person, place, and time. No cranial nerve deficit.   Skin: No rash noted. No erythema.   Psychiatric: She has a normal mood and affect. Her behavior is normal.     ekg sinus rhythm with 1 av block, low voltage QRS ( not new- noted in 2019)    ASSESSMENT and PLAN    ICD-10-CM ICD-9-CM    1. First degree heart block by electrocardiogram  I44.0 426.11    2. Morbid obesity ([place])  E66.01 278.01    3. Other insomnia  G47.09 780.52    4. Chronic low back pain with sciatica, sciatica laterality unspecified, unspecified back pain laterality  M54.40 724.2     G89.29 724.3      338.29      No orders of the defined types were placed in this encounter.    Follow-up and Dispositions    ?? Return in about 1 year (around 04/30/2020).       current treatment plan is effective, no change in therapy  reviewed diet, exercise and weight control.    Patient is a 49 year old female morbidly obese with  back surgery and complicated postop course seen for abnormal EKG.  Has mild stable dyspnea.  No orthopnea PND.  No chest pain.  Occasional palpitations no syncope or near syncope.  EKG reveals sinus rhythm with first-degree AV block.  Has low voltage QRS-noted from prior EKG in 2019.  Recommend to obtain TSH.  Since she has stable symptoms no indication for further cardiac work-up at this time.  Will consider echocardiogram if dyspnea progresses.  Meanwhile advised weight reduction.

## 2019-05-01 NOTE — Progress Notes (Signed)
HISTORY OF PRESENT ILLNESS  Tiffany [name] is a 49 y.o. female.    New Patient  The history is provided by the patient. This is a chronic problem. The problem occurs every several days. The problem has not changed since onset.Associated symptoms include shortness of breath. Pertinent negatives include no chest pain, no abdominal pain and no headaches.   Shortness of Breath  The history is provided by the patient. This is a chronic problem. The problem occurs intermittently.The problem has not changed since onset.Pertinent negatives include no fever, no headaches, no ear pain, no neck pain, no cough, no sputum production, no hemoptysis, no wheezing, no PND, no orthopnea, no chest pain, no vomiting, no abdominal pain, no rash, no leg swelling and no claudication.       Review of Systems   Constitutional: Negative for chills, diaphoresis, fever and weight loss.   HENT: Negative for ear pain and hearing loss.    Eyes: Negative for blurred vision.   Respiratory: Positive for shortness of breath. Negative for cough, hemoptysis, sputum production, wheezing and stridor.    Cardiovascular: Negative for chest pain, palpitations, orthopnea, claudication, leg swelling and PND.   Gastrointestinal: Negative for abdominal pain, heartburn, nausea and vomiting.   Musculoskeletal: Negative for myalgias and neck pain.   Skin: Negative for rash.   Neurological: Negative for dizziness, tingling, tremors, focal weakness, loss of consciousness, weakness and headaches.   Psychiatric/Behavioral: Negative for depression and suicidal ideas.     Family History   Problem Relation Age of Onset   ??? Hypertension Mother    ??? Heart Disease Father    ??? Hypertension Father    ??? Heart Disease Paternal Aunt    ??? Heart Disease Paternal Uncle    ??? Heart Disease Paternal Grandmother        Past Medical History:   Diagnosis Date   ??? Arthritis    ??? Chronic constipation 07/25/2018    Dr Donovan Kail   ??? Chronic low back pain with sciatica 07/25/2018    Dr  Francee Gentile   ??? Chronic pain of right knee 07/25/2018     Dr Vinson Moselle, ortho   ??? Constipation due to opioid therapy 07/25/2018   ??? DVT (deep venous thrombosis) ([place]) 2017    Left groin; Dr Sallee Provencal, vascular, places IVF preop   ??? Dysphagia     Dr Luster Landsberg   ??? Esophageal obstruction     Dr Luster Landsberg   ??? History of fall 09/05/2018    see brief documentation on 09/05/2018   ??? Insomnia 07/25/2018   ??? Morbid obesity ([place]) 03/11/2016   ??? Spasm of back muscles 07/25/2018   ??? Status post right knee replacement 07/25/2018     Dr Vinson Moselle, ortho       Past Surgical History:   Procedure Laterality Date   ??? COLONOSCOPY N/A 10/12/2017    COLONOSCOPY performed by Alferd Patee, MD at [place]   ??? COLONOSCOPY N/A 10/12/2018    COLONOSCOPY performed by Alferd Patee, MD at [facility] ENDOSCOPY   ??? HX ADENOIDECTOMY     ??? HX CHOLECYSTECTOMY  2013   ??? HX COLONOSCOPY  1999    screening - neg results per patient   ??? HX ENDOSCOPY      screening - neg results per patient   ??? HX ENDOSCOPY  10/12/2018    Dr Judie Grieve, [facility]   ??? HX GYN      ectopic preg with tube rupture   ??? HX  KNEE REPLACEMENT Right 12/2017     Dr Vinson Moselle, ortho   ??? HX LUMBAR FUSION  0000000    [id number] - complicated by vascular injury   ??? HX ORTHOPAEDIC      left foot surgery - stress fx   ??? HX ORTHOPAEDIC      L &R carpal tunnel   ??? HX TONSILLECTOMY     ??? IR IVC FILTER  2017    removed June 2017 noted in care everywhere   ??? PR ABDOMEN SURGERY PROC UNLISTED  2017    Open abdomen management (14 days) after vascular injury during lumbar fusion   ??? VASCULAR SURGERY PROCEDURE UNLIST      vascular repair of aortic/venocaval repair.        Social History     Tobacco Use   ??? Smoking status: Never Smoker   ??? Smokeless tobacco: Never Used   Substance Use Topics   ??? Alcohol use: No       Allergies   Allergen Reactions   ??? Darvocet A500 [Propoxyphene N-Acetaminophen] Hives       Outpatient Medications Marked as Taking for the 05/01/19 encounter (Office Visit) with Fredirick Maudlin, MD    Medication Sig Dispense Refill   ??? oxyCODONE IR (ROXICODONE) 5 mg immediate release tablet Take 1 Tab by mouth every four (4) hours as needed for Pain for up to 7 days. Max Daily Amount: 30 mg. DO NOT TAKE UNTIL AFTER SURGERY 40 Tab 0   ??? pregabalin (Lyrica) 75 mg capsule Take 1 Cap by mouth two (2) times a day. Max Daily Amount: 150 mg. 10 Cap 0   ??? [START ON 05/04/2019] rivaroxaban (Xarelto) 10 mg tablet Take 1 Tab by mouth daily. 14 Tab 0   ??? celecoxib (CeleBREX) 200 mg capsule Take 1 Cap by mouth two (2) times daily (with meals). 60 Cap 2   ??? DULoxetine (CYMBALTA) 60 mg capsule Take 1 Cap by mouth daily. 30 Cap 1   ??? oxybutynin (DITROPAN) 5 mg tablet Take 1 Tab by mouth two (2) times a day. Indications: a condition where the urge to urinate results in urine leakage 180 Tab 0   ??? cyclobenzaprine (FLEXERIL) 10 mg tablet Take 1 Tab by mouth two (2) times daily as needed for Muscle Spasm(s). Indications: muscle spasm 60 Tab 2   ??? traZODone (DESYREL) 100 mg tablet Take 1 Tab by mouth nightly. 90 Tab 0   ??? topiramate (Topamax) 25 mg tablet Take  by mouth two (2) times a day.     ??? bisacodyL (Dulcolax, bisacodyl,) 5 mg EC tablet Take 5 mg by mouth daily as needed for Constipation.          Visit Vitals  BP 107/64   Pulse 88   Temp 98.1 ??F (36.7 ??C) (Temporal)   Ht 5' 3.5" (1.613 m)   Wt 139.7 kg (308 lb)   BMI 53.70 kg/m??       Physical Exam   Constitutional: She is oriented to person, place, and time. She appears well-developed and well-nourished.   Morbidly obese   HENT:   Head: Normocephalic and atraumatic.   Eyes: Conjunctivae and EOM are normal. No scleral icterus.   Neck: Normal range of motion. Neck supple. No JVD present.   Cardiovascular: Normal rate, regular rhythm and normal heart sounds. Exam reveals no gallop and no friction rub.   No murmur heard.  Pulmonary/Chest: Effort normal and breath sounds normal. No stridor. No respiratory distress. She has  no wheezes. She has no rales. She exhibits no  tenderness.   Abdominal: She exhibits no distension. There is no abdominal tenderness.   Musculoskeletal: Normal range of motion.         General: No tenderness or edema.   Lymphadenopathy:     She has no cervical adenopathy.   Neurological: She is alert and oriented to person, place, and time. No cranial nerve deficit.   Skin: No rash noted. No erythema.   Psychiatric: She has a normal mood and affect. Her behavior is normal.     ekg sinus rhythm with 1 av block, low voltage QRS ( not new- noted in 2019)    ASSESSMENT and PLAN    ICD-10-CM ICD-9-CM    1. First degree heart block by electrocardiogram  I44.0 426.11    2. Morbid obesity ([place])  E66.01 278.01    3. Other insomnia  G47.09 780.52    4. Chronic low back pain with sciatica, sciatica laterality unspecified, unspecified back pain laterality  M54.40 724.2     G89.29 724.3      338.29      No orders of the defined types were placed in this encounter.    Follow-up and Dispositions    ?? Return in about 1 year (around 04/30/2020).       current treatment plan is effective, no change in therapy  reviewed diet, exercise and weight control.    Patient is a 49 year old female morbidly obese with  back surgery and complicated postop course seen for abnormal EKG.  Has mild stable dyspnea.  No orthopnea PND.  No chest pain.  Occasional palpitations no syncope or near syncope.  EKG reveals sinus rhythm with first-degree AV block.  Has low voltage QRS-noted from prior EKG in 2019.  Recommend to obtain TSH.  Since she has stable symptoms no indication for further cardiac work-up at this time.  Will consider echocardiogram if dyspnea progresses.  Meanwhile advised weight reduction.

## 2019-05-03 ENCOUNTER — Inpatient Hospital Stay: Payer: MEDICARE

## 2019-05-03 LAB — HCG URINE, QL. - POC
HCG, Pregnancy, Urine, POC: NEGATIVE
Pregnancy test,urine (POC): NEGATIVE

## 2019-05-03 MED ORDER — ROPIVACAINE (PF) 5 MG/ML (0.5 %) INJECTION
5 mg/mL (0. %) | INTRAMUSCULAR | Status: AC
Start: 2019-05-03 — End: 2019-05-03
  Administered 2019-05-03: 14:00:00 via PERINEURAL

## 2019-05-03 MED ORDER — SUCCINYLCHOLINE CHLORIDE 20 MG/ML INJECTION
20 mg/mL | INTRAMUSCULAR | Status: DC | PRN
Start: 2019-05-03 — End: 2019-05-03
  Administered 2019-05-03: 15:00:00 via INTRAVENOUS

## 2019-05-03 MED ORDER — CEFAZOLIN 2 GM/50 ML IN DEXTROSE (ISO-OSMOTIC) IVPB
2 gram/50 mL | Freq: Once | INTRAVENOUS | Status: DC
Start: 2019-05-03 — End: 2019-05-03

## 2019-05-03 MED ORDER — DEXAMETHASONE SODIUM PHOSPHATE 4 MG/ML IJ SOLN
4 mg/mL | INTRAMUSCULAR | Status: AC
Start: 2019-05-03 — End: ?

## 2019-05-03 MED ORDER — FENTANYL CITRATE (PF) 50 MCG/ML IJ SOLN
50 mcg/mL | INTRAMUSCULAR | Status: DC | PRN
Start: 2019-05-03 — End: 2019-05-03
  Administered 2019-05-03: 14:00:00 via INTRAVENOUS

## 2019-05-03 MED ORDER — PROPOFOL 10 MG/ML IV EMUL
10 mg/mL | INTRAVENOUS | Status: DC | PRN
Start: 2019-05-03 — End: 2019-05-03
  Administered 2019-05-03 (×3): via INTRAVENOUS

## 2019-05-03 MED ORDER — BALANCED SALT IRRIG SOLN COMB2 INTRAOCULAR
INTRAOCULAR | Status: AC
Start: 2019-05-03 — End: ?

## 2019-05-03 MED ORDER — CEFAZOLIN 3G IN 100 ML 0.9% NS PREMIX
3 gram/100ml | Freq: Once | INTRAVENOUS | Status: AC
Start: 2019-05-03 — End: 2019-05-03
  Administered 2019-05-03: 15:00:00 via INTRAVENOUS

## 2019-05-03 MED ORDER — EPINEPHRINE (PF) 1 MG/ML INJECTION
1 mg/mL ( mL) | INTRAMUSCULAR | Status: DC | PRN
Start: 2019-05-03 — End: 2019-05-03
  Administered 2019-05-03: 16:00:00

## 2019-05-03 MED ORDER — LACTATED RINGERS IV
INTRAVENOUS | Status: DC
Start: 2019-05-03 — End: 2019-05-03

## 2019-05-03 MED ORDER — MIDAZOLAM 1 MG/ML IJ SOLN
1 mg/mL | INTRAMUSCULAR | Status: AC
Start: 2019-05-03 — End: 2019-05-03
  Administered 2019-05-03: 14:00:00 via INTRAVENOUS

## 2019-05-03 MED ORDER — FENTANYL CITRATE (PF) 50 MCG/ML IJ SOLN
50 mcg/mL | INTRAMUSCULAR | Status: DC | PRN
Start: 2019-05-03 — End: 2019-05-03
  Administered 2019-05-03 (×4): via INTRAVENOUS

## 2019-05-03 MED ORDER — SODIUM CHLORIDE 0.9 % IV
INTRAVENOUS | Status: AC
Start: 2019-05-03 — End: ?

## 2019-05-03 MED ORDER — HYDROMORPHONE 2 MG/ML INJECTION SOLUTION
2 mg/mL | INTRAMUSCULAR | Status: DC | PRN
Start: 2019-05-03 — End: 2019-05-03

## 2019-05-03 MED ORDER — FENTANYL CITRATE (PF) 50 MCG/ML IJ SOLN
50 mcg/mL | INTRAMUSCULAR | Status: AC
Start: 2019-05-03 — End: ?

## 2019-05-03 MED ORDER — ACETAMINOPHEN 325 MG TABLET
325 mg | Freq: Once | ORAL | Status: AC
Start: 2019-05-03 — End: 2019-05-03
  Administered 2019-05-03: 18:00:00 via ORAL

## 2019-05-03 MED ORDER — FENTANYL CITRATE (PF) 50 MCG/ML IJ SOLN
50 mcg/mL | INTRAMUSCULAR | Status: DC | PRN
Start: 2019-05-03 — End: 2019-05-03

## 2019-05-03 MED ORDER — ONDANSETRON (PF) 4 MG/2 ML INJECTION
4 mg/2 mL | Freq: Once | INTRAMUSCULAR | Status: AC
Start: 2019-05-03 — End: 2019-05-03
  Administered 2019-05-03: 17:00:00 via INTRAVENOUS

## 2019-05-03 MED ORDER — ACETAMINOPHEN 500 MG TAB
500 mg | Freq: Once | ORAL | Status: AC
Start: 2019-05-03 — End: 2019-05-03
  Administered 2019-05-03: 14:00:00 via ORAL

## 2019-05-03 MED ORDER — MIDAZOLAM 1 MG/ML IJ SOLN
1 mg/mL | INTRAMUSCULAR | Status: DC | PRN
Start: 2019-05-03 — End: 2019-05-03
  Administered 2019-05-03: 14:00:00 via INTRAVENOUS

## 2019-05-03 MED ORDER — LIDOCAINE (PF) 10 MG/ML (1 %) IJ SOLN
10 mg/mL (1 %) | INTRAMUSCULAR | Status: DC | PRN
Start: 2019-05-03 — End: 2019-05-03

## 2019-05-03 MED ORDER — DEXAMETHASONE SODIUM PHOSPHATE 4 MG/ML IJ SOLN
4 mg/mL | INTRAMUSCULAR | Status: DC | PRN
Start: 2019-05-03 — End: 2019-05-03
  Administered 2019-05-03: 15:00:00 via INTRAVENOUS

## 2019-05-03 MED ORDER — PREGABALIN 75 MG CAP
75 mg | Freq: Once | ORAL | Status: AC
Start: 2019-05-03 — End: 2019-05-03
  Administered 2019-05-03: 14:00:00 via ORAL

## 2019-05-03 MED ORDER — FAMOTIDINE 20 MG TAB
20 mg | Freq: Once | ORAL | Status: AC
Start: 2019-05-03 — End: 2019-05-03
  Administered 2019-05-03: 14:00:00 via ORAL

## 2019-05-03 MED ORDER — LIDOCAINE (PF) 20 MG/ML (2 %) IJ SOLN
20 mg/mL (2 %) | INTRAMUSCULAR | Status: DC | PRN
Start: 2019-05-03 — End: 2019-05-03
  Administered 2019-05-03: 15:00:00 via INTRAVENOUS

## 2019-05-03 MED ORDER — FENTANYL CITRATE (PF) 50 MCG/ML IJ SOLN
50 mcg/mL | INTRAMUSCULAR | Status: AC
Start: 2019-05-03 — End: 2019-05-03
  Administered 2019-05-03: 14:00:00 via INTRAVENOUS

## 2019-05-03 MED ORDER — LIDOCAINE (PF) 20 MG/ML (2 %) IJ SOLN
20 mg/mL (2 %) | INTRAMUSCULAR | Status: AC
Start: 2019-05-03 — End: ?

## 2019-05-03 MED ORDER — ONDANSETRON (PF) 4 MG/2 ML INJECTION
4 mg/2 mL | INTRAMUSCULAR | Status: DC | PRN
Start: 2019-05-03 — End: 2019-05-03
  Administered 2019-05-03: 15:00:00 via INTRAVENOUS

## 2019-05-03 MED ORDER — LACTATED RINGERS IV
INTRAVENOUS | Status: DC
Start: 2019-05-03 — End: 2019-05-03
  Administered 2019-05-03: 14:00:00 via INTRAVENOUS

## 2019-05-03 MED ORDER — EPINEPHRINE (PF) 1 MG/ML INJECTION
1 mg/mL ( mL) | INTRAMUSCULAR | Status: AC
Start: 2019-05-03 — End: ?

## 2019-05-03 MED ORDER — SUCCINYLCHOLINE CHLORIDE 200 MG/10 ML (20 MG/ML) INTRAVENOUS SYRINGE
200 mg/10 mL (20 mg/mL) | INTRAVENOUS | Status: AC
Start: 2019-05-03 — End: ?

## 2019-05-03 MED ORDER — ONDANSETRON (PF) 4 MG/2 ML INJECTION
4 mg/2 mL | INTRAMUSCULAR | Status: AC
Start: 2019-05-03 — End: ?

## 2019-05-03 MED ORDER — PROPOFOL 10 MG/ML IV EMUL
10 mg/mL | INTRAVENOUS | Status: AC
Start: 2019-05-03 — End: ?

## 2019-05-03 MED FILL — DIPRIVAN 10 MG/ML INTRAVENOUS EMULSION: 10 mg/mL | INTRAVENOUS | Qty: 20

## 2019-05-03 MED FILL — SODIUM CHLORIDE 0.9 % IV: INTRAVENOUS | Qty: 100

## 2019-05-03 MED FILL — LACTATED RINGERS IV: INTRAVENOUS | Qty: 1000

## 2019-05-03 MED FILL — MIDAZOLAM 1 MG/ML IJ SOLN: 1 mg/mL | INTRAMUSCULAR | Qty: 2

## 2019-05-03 MED FILL — SUCCINYLCHOLINE CHLORIDE 200 MG/10 ML (20 MG/ML) INTRAVENOUS SYRINGE: 200 mg/10 mL (20 mg/mL) | INTRAVENOUS | Qty: 10

## 2019-05-03 MED FILL — LYRICA 75 MG CAPSULE: 75 mg | ORAL | Qty: 1

## 2019-05-03 MED FILL — DEXAMETHASONE SODIUM PHOSPHATE 4 MG/ML IJ SOLN: 4 mg/mL | INTRAMUSCULAR | Qty: 1

## 2019-05-03 MED FILL — EPINEPHRINE (PF) 1 MG/ML INJECTION: 1 mg/mL ( mL) | INTRAMUSCULAR | Qty: 1

## 2019-05-03 MED FILL — ACETAMINOPHEN 500 MG TAB: 500 mg | ORAL | Qty: 2

## 2019-05-03 MED FILL — FAMOTIDINE 20 MG TAB: 20 mg | ORAL | Qty: 1

## 2019-05-03 MED FILL — MAPAP (ACETAMINOPHEN) 325 MG TABLET: 325 mg | ORAL | Qty: 2

## 2019-05-03 MED FILL — FENTANYL CITRATE (PF) 50 MCG/ML IJ SOLN: 50 mcg/mL | INTRAMUSCULAR | Qty: 2

## 2019-05-03 MED FILL — CEFAZOLIN 3G IN 100 ML 0.9% NS PREMIX: 3 gram/100ml | INTRAVENOUS | Qty: 100

## 2019-05-03 MED FILL — ROPIVACAINE (PF) 5 MG/ML (0.5 %) INJECTION: 5 mg/mL (0. %) | INTRAMUSCULAR | Qty: 40

## 2019-05-03 MED FILL — XYLOCAINE-MPF 20 MG/ML (2 %) INJECTION SOLUTION: 20 mg/mL (2 %) | INTRAMUSCULAR | Qty: 5

## 2019-05-03 MED FILL — ONDANSETRON (PF) 4 MG/2 ML INJECTION: 4 mg/2 mL | INTRAMUSCULAR | Qty: 2

## 2019-05-03 MED FILL — XYLOCAINE-MPF 10 MG/ML (1 %) INJECTION SOLUTION: 10 mg/mL (1 %) | INTRAMUSCULAR | Qty: 2

## 2019-05-03 MED FILL — BSS INTRAOCULAR SOLUTION: INTRAOCULAR | Qty: 15

## 2019-05-03 NOTE — Op Note (Signed)
[place]  OPERATIVE REPORT    Name:  Tiffany [name], DRUMMONDS  MR#:   [id number]  DOB:  1971/01/04  ACCOUNT #:  [id number]  DATE OF SERVICE:  05/03/2019    PREOPERATIVE DIAGNOSIS:  Rotator cuff tear, left shoulder.    POSTOPERATIVE DIAGNOSES:  Rotator cuff tear, subscapularis tear, left shoulder.    PROCEDURES PERFORMED:  Arthroscopic rotator cuff repair, arthroscopic subscapularis repair, arthroscopic acromioplasty, left shoulder.    SURGEON:  Richardo Hanks, MD    ASSISTANT:  Murray Hodgkins, PA-C.  Murray Hodgkins, PA-C, instrumental in managing the arthroscope while soft tissue and bony work was done.    ANESTHESIA:  general    IMPLANTS:  Per brief op note.    ESTIMATED BLOOD LOSS:  Minimal.    FINDINGS:  The patient with an os acromiale, a 1-cm tear of the supraspinatus and superior third tear of the subscapularis with a very high BMI, which increased the difficulty and length of the procedure  The reason for increased level of difficulty is high BMI.    COMPLICATIONS:  None.    SPECIMENS REMOVED:  None.    BRIEF HISTORY OF PRESENT ILLNESS:  The patient has had significant amount of problems with the left shoulder, not responding to conservative treatment.  Was consented for surgery after having discussed at length possible risks and complications of surgery including infection, bleeding, and recurrence of pain among other possible problems.    PROCEDURE IN DETAIL:  The patient was taken to the operating room, placed under general endotracheal anesthesia by the anesthesia staff, and placed in a standard arthroscopy holder.  The left arm was prepped with ChloraPrep solution and draped as a free sterile field.  A posterior portal was used as our arthroscopy portal and lateral portal as a work portal.  Portals were made with an 11 blade followed by blunt trocars.  Once the arthroscope was in place, the shoulder was inspected.  There was a superior third of the subscapularis tendon tear.  Using  an anteroinferior portal, a cannula was placed and the bony bed was taken to bleeding bone using a shaver.  A FiberTape suture was passed through subscapularis tissue with a Scorpion passer and loaded into a SwiveLock, which was impacted anteriorly in the lesser tuberosity.  The suture from the anchor was passed through subscapularis tissue as well and tied effecting a very stable repair.  There was a 1 cm tear in the supraspinatus footprint.  Biceps tendon was intact.  The articular surface was intact with fraying of the labrum.  Attention was then placed to the subacromial space.  There was significant synovitis present.  The prominent bursa was debrided using a shaver.  The os acromiale was found and a very prominent undersurface of the acromion was found for which an anteroinferior acromioplasty was performed lateral tip to the [facility] joint converting it to a type 1 acromion.  Through a lateral portal, the tear was defined and bony bed taken to bleeding bone using a shaver.  Mattress suture with FiberTape along with a FiberLink was passed through rotator cuff tissue with a Scorpion and loaded into a SwiveLock, which was impacted laterally after tensioning.  Very stable construct was achieved in this manner.  Subcutaneous tissues were closed with 3-0 Vicryl and the skin with 4-0 Monocryl.  Sterile dressings were applied.  The patient tolerated the procedure well and was taken to the recovery room without problems.      Zayvion Stailey LUCIANO-PEREZ,  MD      [id number]  D:  05/03/2019 12:41  T:  05/03/2019 22:46  JOB #:  [id number] / [id number]

## 2019-05-03 NOTE — Brief Op Note (Signed)
Brief Postoperative Note    Patient: Tiffany [name]  Date of Birth: 1971-01-04  MRN: [id number]    Date of Procedure: 05/03/2019     Pre-Op Diagnosis: S46.012A TRAUMATIC INCOMLETE TEAR OF LEFT ROTATOR CUFF, INITIAL ENCOUNTER    Post-Op Diagnosis:rotator cuff tear, subscapularis tear left shoulder    Procedure(s):  LEFT SHOULDER ARTHROSCOPIC ROTATOR CUFF REPAIR, subscapularis repair, acromioplasty/ARTHREX    Surgeon(s):  Richardo Hanks, MD    Surgical Assistant: Physician Assistant: Rogelia Boga, PA  Surg Asst-1: Marcelle Overlie B    Anesthesia: General     Estimated Blood Loss (mL): Minimal    Complications: None    Specimens: * No specimens in log *     Implants:   Implant Name Type Inv. Item Serial No. Manufacturer Lot No. LRB No. Used Action   ANCHOR C SWIVELOCK 4.75MM - [address] SWIVELOCK 4.75MM [id number] [employer]WD [id number] Left 1 Implanted   [employer] SWIVELOCK 4.75MM - [id number]  Fransisco Beau 4.75MM  [employer] [id number] Left 1 Implanted       Drains: * No LDAs found *    Findings: as above    Electronically Signed by Richardo Hanks, MD on 05/03/2019 at 1:10 PM

## 2019-05-03 NOTE — Anesthesia Procedure Notes (Signed)
Peripheral Block    Start time: 05/03/2019 9:54 AM  End time: 05/03/2019 10:05 AM  Performed by: Susanne Greenhouse, DO  Authorized by: Susanne Greenhouse, DO       Pre-procedure:   Indications: at surgeon's request and post-op pain management    Preanesthetic Checklist: patient identified, risks and benefits discussed, site marked, timeout performed, anesthesia consent given and patient being monitored    Timeout Time: 09:54          Block Type:   Block Type:  Interscalene  Laterality:  Left  Monitoring:  Responsive to questions, standard ASA monitoring, continuous pulse ox, oxygen, frequent vital sign checks and heart rate  Injection Technique:  Single shot  Procedures: ultrasound guided and nerve stimulator    Patient Position: seated  Prep: chlorhexidine    Location:  Interscalene  Needle Type:  Stimuplex  Needle Gauge:  22 G  Needle Localization:  Nerve stimulator and ultrasound guidance  Motor Response comment:   Motor Response: minimal motor response >0.4 mA   Medication Injected:  FentaNYL citrate (PF) injection sedation initial, 100 mcg  midazolam (VERSED) injection, 2 mg  ropivacaine (PF) (NAROPIN)(0.5%) 5 mg/mL injection, 30 mL  Med Admin Time: 05/03/2019 10:05 AM    Assessment:  Number of attempts:  2  Injection Assessment:  Incremental injection every 5 mL, negative aspiration for CSF, no paresthesia, ultrasound image on chart, local visualized surrounding nerve on ultrasound, negative aspiration for blood and no intravascular symptoms  Patient tolerance:  Patient tolerated the procedure well with no immediate complications

## 2019-05-03 NOTE — Anesthesia Post-Procedure Evaluation (Signed)
Procedure(s):  LEFT SHOULDER ARTHROSCOPIC ROTATOR CUFF REPAIR/ARTHREX.    general    Anesthesia Post Evaluation      Multimodal analgesia: multimodal analgesia used between 6 hours prior to anesthesia start to PACU discharge  Patient location during evaluation: bedside  Patient participation: complete - patient participated  Level of consciousness: awake  Pain score: 0  Pain management: adequate  Airway patency: patent  Anesthetic complications: no  Cardiovascular status: stable  Respiratory status: acceptable  Hydration status: acceptable  Post anesthesia nausea and vomiting:  controlled  Final Post Anesthesia Temperature Assessment:  Normothermia (36.0-37.5 degrees C)      INITIAL Post-op Vital signs:   Vitals Value Taken Time   BP 134/84 05/03/19 1253   Temp 36.3 ??C (97.4 ??F) 05/03/19 1223   Pulse 84 05/03/19 1259   Resp 23 05/03/19 1259   SpO2 99 % 05/03/19 1259   Vitals shown include unvalidated device data.

## 2019-05-03 NOTE — Progress Notes (Signed)
Date of Surgery Update:  Tiffany [name] was seen and examined.  History and physical has been reviewed. The patient has been examined. There have been no significant clinical changes since the completion of the originally dated History and Physical.    Signed By: Richardo Hanks, MD     May 03, 2019 9:46 AM         The above patient was independently seen and examined by myself. The case was then discussed and a proper diagnosis/plan was made. I agree with the above assessment.

## 2019-05-03 NOTE — Anesthesia Pre-Procedure Evaluation (Addendum)
Relevant Problems   ENDOCRINE   (+) Morbid obesity (HCC)       Anesthetic History   No history of anesthetic complications            Review of Systems / Medical History  Patient summary reviewed, nursing notes reviewed and pertinent labs reviewed    Pulmonary                   Neuro/Psych              Cardiovascular                       GI/Hepatic/Renal                Endo/Other        Morbid obesity and arthritis     Other Findings   Comments: Hx DVT           Physical Exam    Airway  Mallampati: II  TM Distance: 4 - 6 cm  Neck ROM: normal range of motion   Mouth opening: Normal     Cardiovascular  Regular rate and rhythm,  S1 and S2 normal,  no murmur, click, rub, or gallop             Dental    Dentition: Poor dentition, Lower partial plate and Upper partial plate     Pulmonary  Breath sounds clear to auscultation               Abdominal  GI exam deferred       Other Findings            Anesthetic Plan    ASA: 3  Anesthesia type: general      Post-op pain plan if not by surgeon: peripheral nerve block single    Induction: Intravenous  Anesthetic plan and risks discussed with: Patient

## 2019-05-03 NOTE — Op Note (Signed)
Brief Postoperative Note    Patient: Tiffany [name]  Date of Birth: 09-Apr-1970  MRN: [id number]    Date of Procedure: 05/03/2019     Pre-Op Diagnosis: S46.012A TRAUMATIC INCOMLETE TEAR OF LEFT ROTATOR CUFF, INITIAL ENCOUNTER    Post-Op Diagnosis:rotator cuff tear, subscapularis tear left shoulder    Procedure(s):  LEFT SHOULDER ARTHROSCOPIC ROTATOR CUFF REPAIR, subscapularis repair, acromioplasty/ARTHREX    Surgeon(s):  Richardo Hanks, MD    Surgical Assistant: Physician Assistant: Rogelia Boga, PA  Surg Asst-1: Marcelle Overlie B    Anesthesia: General     Estimated Blood Loss (mL): Minimal    Complications: None    Specimens: * No specimens in log *     Implants:   Implant Name Type Inv. Item Serial No. Manufacturer Lot No. LRB No. Used Action   ANCHOR C SWIVELOCK 4.75MM - [address] SWIVELOCK 4.75MM [id number] [employer]WD [id number] Left 1 Implanted   [employer] SWIVELOCK 4.75MM - [id number]  Fransisco Beau 4.75MM  [employer]WD [id number] Left 1 Implanted       Drains: * No LDAs found *    Findings: as above    Electronically Signed by Richardo Hanks, MD on 05/03/2019 at 1:10 PM

## 2019-05-03 NOTE — Anesthesia Pre-Procedure Evaluation (Signed)
Relevant Problems   ENDOCRINE   (+) Morbid obesity (HCC)       Anesthetic History   No history of anesthetic complications            Review of Systems / Medical History  Patient summary reviewed, nursing notes reviewed and pertinent labs reviewed    Pulmonary                   Neuro/Psych              Cardiovascular                       GI/Hepatic/Renal                Endo/Other        Morbid obesity and arthritis     Other Findings   Comments: Hx DVT           Physical Exam    Airway  Mallampati: II  TM Distance: 4 - 6 cm  Neck ROM: normal range of motion   Mouth opening: Normal     Cardiovascular  Regular rate and rhythm,  S1 and S2 normal,  no murmur, click, rub, or gallop             Dental    Dentition: Poor dentition, Lower partial plate and Upper partial plate     Pulmonary  Breath sounds clear to auscultation               Abdominal  GI exam deferred       Other Findings            Anesthetic Plan    ASA: 3  Anesthesia type: general      Post-op pain plan if not by surgeon: peripheral nerve block single    Induction: Intravenous  Anesthetic plan and risks discussed with: Patient

## 2019-05-03 NOTE — Op Note (Signed)
Tiffany [name]  OPERATIVE REPORT    Name:  Tiffany [name], Tiffany [name]  MR#:   [id number]  DOB:  November 07, 1970  ACCOUNT #:  [id number]  DATE OF SERVICE:  05/03/2019    PREOPERATIVE DIAGNOSIS:  Rotator cuff tear, left shoulder.    POSTOPERATIVE DIAGNOSES:  Rotator cuff tear, subscapularis tear, left shoulder.    PROCEDURES PERFORMED:  Arthroscopic rotator cuff repair, arthroscopic subscapularis repair, arthroscopic acromioplasty, left shoulder.    SURGEON:  Richardo Hanks, MD    ASSISTANT:  Murray Hodgkins, PA-C.  Murray Hodgkins, PA-C, instrumental in managing the arthroscope while soft tissue and bony work was done.    ANESTHESIA:  general    IMPLANTS:  Per brief op note.    ESTIMATED BLOOD LOSS:  Minimal.    FINDINGS:  The patient with an os acromiale, a 1-cm tear of the supraspinatus and superior third tear of the subscapularis with a very high BMI, which increased the difficulty and length of the procedure  The reason for increased level of difficulty is high BMI.    COMPLICATIONS:  None.    SPECIMENS REMOVED:  None.    BRIEF HISTORY OF PRESENT ILLNESS:  The patient has had significant amount of problems with the left shoulder, not responding to conservative treatment.  Was consented for surgery after having discussed at length possible risks and complications of surgery including infection, bleeding, and recurrence of pain among other possible problems.    PROCEDURE IN DETAIL:  The patient was taken to the operating room, placed under general endotracheal anesthesia by the anesthesia staff, and placed in a standard arthroscopy holder.  The left arm was prepped with ChloraPrep solution and draped as a free sterile field.  A posterior portal was used as our arthroscopy portal and lateral portal as a work portal.  Portals were made with an 11 blade followed by blunt trocars.  Once the arthroscope was in place, the shoulder was inspected.  There was a superior third of the subscapularis tendon tear.  Using  an anteroinferior portal, a cannula was placed and the bony bed was taken to bleeding bone using a shaver.  A FiberTape suture was passed through subscapularis tissue with a Scorpion passer and loaded into a SwiveLock, which was impacted anteriorly in the lesser tuberosity.  The suture from the anchor was passed through subscapularis tissue as well and tied effecting a very stable repair.  There was a 1 cm tear in the supraspinatus footprint.  Biceps tendon was intact.  The articular surface was intact with fraying of the labrum.  Attention was then placed to the subacromial space.  There was significant synovitis present.  The prominent bursa was debrided using a shaver.  The os acromiale was found and a very prominent undersurface of the acromion was found for which an anteroinferior acromioplasty was performed lateral tip to the [facility] joint converting it to a type 1 acromion.  Through a lateral portal, the tear was defined and bony bed taken to bleeding bone using a shaver.  Mattress suture with FiberTape along with a FiberLink was passed through rotator cuff tissue with a Scorpion and loaded into a SwiveLock, which was impacted laterally after tensioning.  Very stable construct was achieved in this manner.  Subcutaneous tissues were closed with 3-0 Vicryl and the skin with 4-0 Monocryl.  Sterile dressings were applied.  The patient tolerated the procedure well and was taken to the recovery room without problems.      Vivian Neuwirth LUCIANO-PEREZ,  MD      [id number]  D:  05/03/2019 12:41  T:  05/03/2019 22:46  JOB #:  [id number] / [id number]

## 2019-05-06 ENCOUNTER — Inpatient Hospital Stay
Admit: 2019-05-06 | Payer: MEDICARE | Attending: Rehabilitative and Restorative Service Providers" | Primary: Nurse Practitioner

## 2019-05-06 ENCOUNTER — Telehealth

## 2019-05-06 DIAGNOSIS — M25512 Pain in left shoulder: Secondary | ICD-10-CM

## 2019-05-06 MED ORDER — HYDROCODONE-ACETAMINOPHEN 10 MG-325 MG TAB
10-325 mg | ORAL_TABLET | ORAL | 0 refills | Status: AC | PRN
Start: 2019-05-06 — End: 2019-05-13

## 2019-05-06 NOTE — Telephone Encounter (Signed)
Patient reports she called the on call doctor over the weekend and she was told to increase the dosage of Oxycodone.  Patient states she's still in pain and wanted physician to know.  Confirmed pharmacy on file is correct and patient can be reached at [phone] if needed.

## 2019-05-06 NOTE — Progress Notes (Signed)
[facility] ??? [facility]              [address] [place] [address]  [phone]   [phone] fax    Plan of Care/ Statement of Necessity for Physical Therapy Services    Patient name: Tiffany [name] Start of Care: 05/06/2019   Referral source: Richardo Hanks,* DOB: Feb 11, 1970    Medical Diagnosis: Pain in left shoulder [M25.512]  Payor: [place] / Plan: [place] / Product Type: Managed Care Medicare /  Onset Date:05/03/2019 DOS Arthroscopic rotator cuff repair, arthroscopic subscapularis repair, arthroscopic acromioplasty, left shoulder.    Treatment Diagnosis: Left shoulder pain   Prior Hospitalization: see medical history Provider#: [id number]   Medications: Verified on Patient summary List    Comorbidities: Arthritis, Back Pain, BMI 54.2, Surgery   Prior Level of Function: Patient is on medical disability. She was doing a little housekeeping, light grocery shopping.  She was able to do simple cooking.      The Plan of Care and following information is based on the information from the initial evaluation.  Assessment/ key information:  Patient with signs and symptoms consistent with left shoulder pain 3 days post op left RTC repair.  She is in a sling.  She has PROM to 90 degrees elevation after manual therapy techniques.  Patient notes tenderness about the left GH joint.  Functionally, she needs assistance with all self care activities.      Patient will benefit from a program of skilled physical therapy to include therapeutic exercises to address strength deficits, therapeutic activities to improve functional mobility, neuromuscular reeducation to address balance, coordination and proprioception, manual therapy to address ROM and tissue extensibility and modalities as indicated.  All questions were answered.    Evaluation Complexity History MEDIUM  Complexity : 1-2 comorbidities / personal factors will impact the outcome/ POC ;  Examination MEDIUM Complexity : 3 Standardized tests and measures addressing body structure, function, activity limitation and / or participation in recreation  ;Presentation MEDIUM Complexity : Evolving with changing characteristics  ;Clinical Decision Making MEDIUM Complexity : FOTO score of 26-74  Overall Complexity Rating: MEDIUM  Problem List: pain affecting function, decrease ROM, decrease strength, decrease ADL/ functional abilitiies, decrease activity tolerance and decrease flexibility/ joint mobility   Treatment Plan may include any combination of the following: Therapeutic exercise, Therapeutic activities, Neuromuscular re-education, Physical agent/modality and Manual therapy  Patient / Family readiness to learn indicated by: asking questions, trying to perform skills and interest  Persons(s) to be included in education: patient (P)  Barriers to Learning/Limitations: None  Patient Goal (s): ???Be able to go back to my daily routine and lift my arm above my head and reach out without pain???  Patient Self Reported Health Status: good  Rehabilitation Potential: good    Short Term Goals: To be accomplished in 1 weeks:  1.  Patient will become proficient in their HEP and will be compliant in performing that program.  Evaluation:   Patient given a written/illustrated HEP.  2.  Patient will demonstrate PROM left shoulder flexion and scaption 0-100 degrees in order to increase functional mobility.  Evaluation:  PROM flex 0-90; scaption 0-70    Long Term Goals: To be accomplished in 4 weeks:  1. Patient's pain level will be 3-4/10 with activity in order to improve patient's ability to perform normal ADLs.  Evaluation:  7/10-10/10.  2. Patient  will demonstrate PROM left shoulder flex 0-140, scaption 0-140 to increase ease of ADLs.  Evaluation:  PROM left shoulder flexion 0-90, scaption 0-70  3. Patient will increase FOTO score to 60 to indicate increased functional mobility.  Evaluation: FOTO = 34  4. Patient will be  able to reach to the bottom shelf of a kitchen cabinet in order to perform normal ADLs.  Evaluation:  NT per protocol    Frequency / Duration: Patient to be seen 2-3 times per week for 4 weeks.    Patient/ Caregiver education and instruction: Diagnosis, prognosis, exercises   [x]   Plan of care has been reviewed with PTA      Certification Period: 05/06/2019 - 06/05/2019  Henderson Fenton, PT 05/06/2019 2:19 PM  ________________________________________________________________________    I certify that the above Therapy Services are being furnished while the patient is under my care. I agree with the treatment plan and certify that this therapy is necessary.    27 Signature:____________Date:_________TIME:________     Richardo Hanks,*  ** Signature, Date and Time must be completed for valid certification **  Please sign and return to [facility] ??? [facility]              [address] [place] [address]  [phone]   [phone] fax

## 2019-05-06 NOTE — Telephone Encounter (Signed)
Going to stop roxicodone. Switching to norco 10 instead. If still having issues call us back

## 2019-05-06 NOTE — Telephone Encounter (Signed)
Contacted pt at her home number.  Informed pt of the message.  Pt thanked Probation officer for call and information.

## 2019-05-06 NOTE — Progress Notes (Signed)
PT DAILY TREATMENT NOTE/SHOULDER EVAL 11-20    Patient Name: Tiffany [name]  Date:05/06/2019  DOB: 10/31/1970  [x]   Patient DOB Verified  Payor: [employer] MEDICARE COMPLETE / Plan: [place] / Product Type: Managed Care Medicare /    In time:2:42  Out time:3:21  Total Treatment Time (min): 39  Visit #: 1 of 12    Medicare/BCBS Only   Total Timed Codes (min):  39 1:1 Treatment Time:  39       Treatment Area: Pain in left shoulder [M25.512]    SUBJECTIVE  Pain Level (0-10 scale): 8/10 now; 7/10 at best; 10/10 at worst.  [x] constant [] intermittent [] improving [] worsening [x] no change since onset    Any medication changes, allergies to medications, adverse drug reactions, diagnosis change, or new procedure performed?: [x]  No    []  Yes (see summary sheet for update)  Subjective functional status/changes:     PLOF: Patient is on medical disability. She was doing a little housekeeping, light grocery shopping.  She was able to do simple cooking.  Limitations to PLOF: Not doing any of her normal household chores.  Mechanism of Injury: Patient had fallen a few times and did note her shoulder was sore.  Third time she fell her dog jumped up onto her knocking her down.  Became more painful and lost ROM.  Current symptoms/Complaints: Constant left shoulder pain with limited motion of the shoulder and arm.  She is 3 days post op and is not allowed to move her shoulder.  Previous Treatment/Compliance: None  PMHx/Surgical Hx: surgery on 05/03/2019.  Work Hx: Disabled  Pt Goals: "Be able to go back to my daily routine and lift my arm above my head and reach out without pain"  Barriers: [x] pain [] financial [] time [] transportation [] other  Cognition: A & O x 3    Other:    OBJECTIVE/EXAMINATION  Domestic Life: Lives with her husband and her niece.  Activity/Recreational Limitations: Unable to perform her normal household activities.  Mobility: ambulates without deviation.  Decreased cadence.  Self Care: Needs help with  dressing and bathing.      15 min [x] Eval                  [] Re-Eval       14 min Therapeutic Exercise:  []  See flow sheet :Emphasis on increasing PROM of the left shoulder and wrist and forearm strength.   Rationale: increase ROM and increase strength to improve the patient???s ability to increase ability to perform functional activities.    10 min Manual Therapy:  Manual stretching to increase ROM and joint compression to reduce swelling.   The manual therapy interventions were performed at a separate and distinct time from the therapeutic activities interventions.  Rationale: decrease pain, increase ROM and increase tissue extensibility to increase ease of motion to improve function.          With   []  TE   []  TA   []  neuro   []  other: Patient Education: [x]  Review HEP    []  Progressed/Changed HEP based on:   []  positioning   []  body mechanics   []  transfers   []  heat/ice application    []  other:      Other Objective/Functional Measures:     Physical Therapy Evaluation - Shoulder    Posture: []  Poor    [x]  Fair    []  Good    Describe:    ROM:  []  Unable to assess at this time  AROM                                                                PROM   Left Right  Left Right   Flexion  0-155 Flexion 0-90    Extension   Extension     Scaption/ABD  0-130 Scaption/ABD 0-70    ER @ 0 Degrees   ER @ 0 Degrees     ER @ 90 Degrees   ER @ 90 Degrees     IR @ 90 Degrees   IR @ 90 Degrees       End Feel / Painful Arc: empty end feel with pain.    Strength:   [x]  Unable to assess at this time                                                                            L (1-5) R (1-5) Pain   Flexors   []  Yes   []  No   Abductors   []  Yes   []  No   External Rotators   []  Yes   []  No   Internal Rotators   []  Yes   []  No   Supraspinatus   []  Yes   []  No   Serratus Anterior   []  Yes   []  No   Lower Trapezius   []  Yes   []  No   Elbow Flexion   []  Yes   []  No   Elbow Extension   []  Yes   []  No        Scapulohumoral Control / Rhythm:  Able to eccentrically lower with good control? Left: []  Yes   [x]  No     Right: [x]  Yes   []  No    Palpation  [x]  Min  []  Mod  []  Severe    Location:  Left GH joint.    Optional Tests:    Deferred:  Adson's Test  []  Pos   []  Neg Yergason's Test []  Pos   []  Neg  Roo's Test  []  Pos   []  Neg Gilchrist's Sign []  Pos   []  Neg  Neer's Test  []  Pos   []  Neg Clunk Test  []  Pos   []  Neg  Hawkin's Test  []  Pos   []  Neg AC Joint  []  Pos   []  Neg  Speed's Test  []  Pos   []  Neg SC Joint  []  Pos   []  Neg  Empty Can  []  Pos   []  Neg Pectoral Tightness []  Pos   []  Neg  Anterior Apprehension []  Pos   []  Neg   Posterior Apprehension []  Pos   []  Neg    Pain Level (0-10 scale) post treatment: 5-6    ASSESSMENT/Changes in Function: Patient with signs and symptoms consistent with left shoulder pain 3 days post op left RTC repair.  She is in a sling.  She has PROM to 90 degrees elevation after manual therapy techniques.  Patient  notes tenderness about the left GH joint.  Functionally, she needs assistance with all self care activities.    ??  Patient will continue to benefit from skilled PT services to modify and progress therapeutic interventions, address functional mobility deficits, address ROM deficits, address strength deficits, analyze and address soft tissue restrictions, analyze and cue movement patterns, analyze and modify body mechanics/ergonomics and assess and modify postural abnormalities to attain remaining goals.     [x]   See Plan of Care  []   See progress note/recertification  []   See Discharge Summary         Progress towards goals / Updated goals:  Short Term Goals: To be accomplished in 1 weeks:  1.  Patient will become proficient in their HEP and will be compliant in performing that program.  Evaluation:   Patient given a written/illustrated HEP.  2.  Patient will demonstrate PROM left shoulder flexion and scaption 0-100 degrees in order to increase functional mobility.   Evaluation:  PROM flex 0-90; scaption 0-70  ??  Long Term Goals: To be accomplished in 4 weeks:  1. Patient's pain level will be 3-4/10 with activity in order to improve patient's ability to perform normal ADLs.  Evaluation:  7/10-10/10.  2. Patient will demonstrate PROM left shoulder flex 0-140, scaption 0-140 to increase ease of ADLs.  Evaluation:  PROM left shoulder flexion 0-90, scaption 0-70  3. Patient will increase FOTO score to 60 to indicate increased functional mobility.  Evaluation: FOTO = 34  4. Patient will be able to reach to the bottom shelf of a kitchen cabinet in order to perform normal ADLs.  Evaluation:  NT per protocol  ??    PLAN  [x]   Upgrade activities as tolerated     [x]   Continue plan of care  []   Update interventions per flow sheet       []   Discharge due to:_  []   Other:_      Henderson Vinton, PT 05/06/2019  2:36 PM

## 2019-05-06 NOTE — Progress Notes (Signed)
 [facility]              [address]        [place], [place] [address]  [phone]   [phone] fax    Plan of Care/ Statement of Necessity for Physical Therapy Services    Patient name: Tiffany [name] Start of Care: 05/06/2019   Referral source: Vale Hover,* DOB: [birth year]    Medical Diagnosis: Pain in left shoulder [M25.512]  Payor: AARP MEDICARE COMPLETE / Plan: [facility] AARP MEDICARE COMPLETE / Product Type: Managed Care Medicare /  Onset Date:05/03/2019 DOS Arthroscopic rotator cuff repair, arthroscopic subscapularis repair, arthroscopic acromioplasty, left shoulder.    Treatment Diagnosis: Left shoulder pain   Prior Hospitalization: see medical history Provider#: [id number]   Medications: Verified on Patient summary List    Comorbidities: Arthritis, Back Pain, BMI 54.2, Surgery   Prior Level of Function: Patient is on medical disability. She was doing a little housekeeping, light grocery shopping.  She was able to do simple cooking.      The Plan of Care and following information is based on the information from the initial evaluation.  Assessment/ key information:  Patient with signs and symptoms consistent with left shoulder pain 3 days post op left RTC repair.  She is in a sling.  She has PROM to 90 degrees elevation after manual therapy techniques.  Patient notes tenderness about the left GH joint.  Functionally, she needs assistance with all self care activities.      Patient will benefit from a program of skilled physical therapy to include therapeutic exercises to address strength deficits, therapeutic activities to improve functional mobility, neuromuscular reeducation to address balance, coordination and proprioception, manual therapy to address ROM and tissue extensibility and modalities as indicated.  All questions were answered.    Evaluation Complexity History MEDIUM  Complexity : 1-2 comorbidities / personal factors will impact the outcome/ POC ;  Examination MEDIUM Complexity : 3 Standardized tests and measures addressing body structure, function, activity limitation and / or participation in recreation  ;Presentation MEDIUM Complexity : Evolving with changing characteristics  ;Clinical Decision Making MEDIUM Complexity : FOTO score of 26-74  Overall Complexity Rating: MEDIUM  Problem List: pain affecting function, decrease ROM, decrease strength, decrease ADL/ functional abilitiies, decrease activity tolerance and decrease flexibility/ joint mobility   Treatment Plan may include any combination of the following: Therapeutic exercise, Therapeutic activities, Neuromuscular re-education, Physical agent/modality and Manual therapy  Patient / Family readiness to learn indicated by: asking questions, trying to perform skills and interest  Persons(s) to be included in education: patient (P)  Barriers to Learning/Limitations: None  Patient Goal (s): "Be able to go back to my daily routine and lift my arm above my head and reach out without pain"  Patient Self Reported Health Status: good  Rehabilitation Potential: good    Short Term Goals: To be accomplished in 1 weeks:  1.  Patient will become proficient in their HEP and will be compliant in performing that program.  Evaluation:   Patient given a written/illustrated HEP.  2.  Patient will demonstrate PROM left shoulder flexion and scaption 0-100 degrees in order to increase functional mobility.  Evaluation:  PROM flex 0-90; scaption 0-70    Long Term Goals: To be accomplished in 4 weeks:  1. Patient's pain level will be 3-4/10 with activity in order to improve patient's ability to perform normal ADLs.  Evaluation:  7/10-10/10.  2. Patient  will demonstrate PROM left shoulder flex 0-140, scaption 0-140 to increase ease of ADLs.  Evaluation:  PROM left shoulder flexion 0-90, scaption 0-70  3. Patient will increase FOTO score to 60 to indicate increased functional mobility.  Evaluation: FOTO = 34  4. Patient will be  able to reach to the bottom shelf of a kitchen cabinet in order to perform normal ADLs.  Evaluation:  NT per protocol    Frequency / Duration: Patient to be seen 2-3 times per week for 4 weeks.    Patient/ Caregiver education and instruction: Diagnosis, prognosis, exercises   [x]   Plan of care has been reviewed with PTA      Certification Period: 05/06/2019 - 06/05/2019  Lupita JULIANNA Beams, PT 05/06/2019 2:19 PM  ________________________________________________________________________    I certify that the above Therapy Services are being furnished while the patient is under my care. I agree with the treatment plan and certify that this therapy is necessary.    Physician's Signature:____________Date:_________TIME:________     Vale Hover,*  ** Signature, Date and Time must be completed for valid certification **  Please sign and return to [facility] - [facility]              [address]        [place], [place] [address]  [phone]   [phone] fax

## 2019-05-06 NOTE — Progress Notes (Signed)
 PT DAILY TREATMENT NOTE/SHOULDER EVAL 11-20    Patient Name: Tiffany [name]  Date:05/06/2019  DOB: 03/20/1970  [x]   Patient DOB Verified  Payor: AARP MEDICARE COMPLETE / Plan: [facility] AARP MEDICARE COMPLETE / Product Type: Managed Care Medicare /    In time:2:42  Out time:3:21  Total Treatment Time (min): 39  Visit #: 1 of 12    Medicare/BCBS Only   Total Timed Codes (min):  39 1:1 Treatment Time:  39       Treatment Area: Pain in left shoulder [M25.512]    SUBJECTIVE  Pain Level (0-10 scale): 8/10 now; 7/10 at best; 10/10 at worst.  [x] constant [] intermittent [] improving [] worsening [x] no change since onset    Any medication changes, allergies to medications, adverse drug reactions, diagnosis change, or new procedure performed?: [x]  No    []  Yes (see summary sheet for update)  Subjective functional status/changes:     PLOF: Patient is on medical disability. She was doing a little housekeeping, light grocery shopping.  She was able to do simple cooking.  Limitations to PLOF: Not doing any of her normal household chores.  Mechanism of Injury: Patient had fallen a few times and did note her shoulder was sore.  Third time she fell her dog jumped up onto her knocking her down.  Became more painful and lost ROM.  Current symptoms/Complaints: Constant left shoulder pain with limited motion of the shoulder and arm.  She is 3 days post op and is not allowed to move her shoulder.  Previous Treatment/Compliance: None  PMHx/Surgical Hx: surgery on 05/03/2019.  Work Hx: Disabled  Pt Goals: Be able to go back to my daily routine and lift my arm above my head and reach out without pain  Barriers: [x] pain [] financial [] time [] transportation [] other  Cognition: A & O x 3    Other:    OBJECTIVE/EXAMINATION  Domestic Life: Lives with her husband and her niece.  Activity/Recreational Limitations: Unable to perform her normal household activities.  Mobility: ambulates without deviation.  Decreased cadence.  Self Care: Needs help with  dressing and bathing.      15 min [x] Eval                  [] Re-Eval       14 min Therapeutic Exercise:  []  See flow sheet :Emphasis on increasing PROM of the left shoulder and wrist and forearm strength.   Rationale: increase ROM and increase strength to improve the patient's ability to increase ability to perform functional activities.    10 min Manual Therapy:  Manual stretching to increase ROM and joint compression to reduce swelling.   The manual therapy interventions were performed at a separate and distinct time from the therapeutic activities interventions.  Rationale: decrease pain, increase ROM and increase tissue extensibility to increase ease of motion to improve function.          With   []  TE   []  TA   []  neuro   []  other: Patient Education: [x]  Review HEP    []  Progressed/Changed HEP based on:   []  positioning   []  body mechanics   []  transfers   []  heat/ice application    []  other:      Other Objective/Functional Measures:     Physical Therapy Evaluation - Shoulder    Posture: []  Poor    [x]  Fair    []  Good    Describe:    ROM:  []  Unable to assess at this time  AROM                                                                PROM   Left Right  Left Right   Flexion  0-155 Flexion 0-90    Extension   Extension     Scaption/ABD  0-130 Scaption/ABD 0-70    ER @ 0 Degrees   ER @ 0 Degrees     ER @ 90 Degrees   ER @ 90 Degrees     IR @ 90 Degrees   IR @ 90 Degrees       End Feel / Painful Arc: empty end feel with pain.    Strength:   [x]  Unable to assess at this time                                                                            L (1-5) R (1-5) Pain   Flexors   []  Yes   []  No   Abductors   []  Yes   []  No   External Rotators   []  Yes   []  No   Internal Rotators   []  Yes   []  No   Supraspinatus   []  Yes   []  No   Serratus Anterior   []  Yes   []  No   Lower Trapezius   []  Yes   []  No   Elbow Flexion   []  Yes   []  No   Elbow Extension   []  Yes   []  No        Scapulohumoral Control / Rhythm:  Able to eccentrically lower with good control? Left: []  Yes   [x]  No     Right: [x]  Yes   []  No    Palpation  [x]  Min  []  Mod  []  Severe    Location:  Left GH joint.    Optional Tests:    Deferred:  Adson's Test  []  Pos   []  Neg Yergason's Test []  Pos   []  Neg  Roo's Test  []  Pos   []  Neg Gilchrist's Sign []  Pos   []  Neg  Neer's Test  []  Pos   []  Neg Clunk Test  []  Pos   []  Neg  Hawkin's Test  []  Pos   []  Neg AC Joint  []  Pos   []  Neg  Speed's Test  []  Pos   []  Neg SC Joint  []  Pos   []  Neg  Empty Can  []  Pos   []  Neg Pectoral Tightness []  Pos   []  Neg  Anterior Apprehension []  Pos   []  Neg   Posterior Apprehension []  Pos   []  Neg    Pain Level (0-10 scale) post treatment: 5-6    ASSESSMENT/Changes in Function: Patient with signs and symptoms consistent with left shoulder pain 3 days post op left RTC repair.  She is in a sling.  She has PROM to 90 degrees elevation after manual therapy techniques.  Patient  notes tenderness about the left GH joint.  Functionally, she needs assistance with all self care activities.      Patient will continue to benefit from skilled PT services to modify and progress therapeutic interventions, address functional mobility deficits, address ROM deficits, address strength deficits, analyze and address soft tissue restrictions, analyze and cue movement patterns, analyze and modify body mechanics/ergonomics and assess and modify postural abnormalities to attain remaining goals.     [x]   See Plan of Care  []   See progress note/recertification  []   See Discharge Summary         Progress towards goals / Updated goals:  Short Term Goals: To be accomplished in 1 weeks:  1.  Patient will become proficient in their HEP and will be compliant in performing that program.  Evaluation:   Patient given a written/illustrated HEP.  2.  Patient will demonstrate PROM left shoulder flexion and scaption 0-100 degrees in order to increase functional  mobility.  Evaluation:  PROM flex 0-90; scaption 0-70    Long Term Goals: To be accomplished in 4 weeks:  1. Patient's pain level will be 3-4/10 with activity in order to improve patient's ability to perform normal ADLs.  Evaluation:  7/10-10/10.  2. Patient will demonstrate PROM left shoulder flex 0-140, scaption 0-140 to increase ease of ADLs.  Evaluation:  PROM left shoulder flexion 0-90, scaption 0-70  3. Patient will increase FOTO score to 60 to indicate increased functional mobility.  Evaluation: FOTO = 34  4. Patient will be able to reach to the bottom shelf of a kitchen cabinet in order to perform normal ADLs.  Evaluation:  NT per protocol      PLAN  [x]   Upgrade activities as tolerated     [x]   Continue plan of care  []   Update interventions per flow sheet       []   Discharge due to:_  []   Other:_      Lupita JULIANNA Beams, PT 05/06/2019  2:36 PM

## 2019-05-08 ENCOUNTER — Encounter

## 2019-05-09 ENCOUNTER — Ambulatory Visit
Admit: 2019-05-09 | Discharge: 2019-05-09 | Payer: MEDICARE | Attending: Physician Assistant | Primary: Nurse Practitioner

## 2019-05-09 ENCOUNTER — Ambulatory Visit: Attending: Physician Assistant | Primary: Nurse Practitioner

## 2019-05-09 DIAGNOSIS — M75102 Unspecified rotator cuff tear or rupture of left shoulder, not specified as traumatic: Secondary | ICD-10-CM

## 2019-05-09 NOTE — Progress Notes (Signed)
Tiffany [name]  1970/05/16     HISTORY OF PRESENT ILLNESS  Tiffany [name] is a 49 y.o. female who presents today for evaluation s/p Left shoulder arthroscopic 1cm rotator cuff repair, subscapularis repair on 05/03/19. Patient has been going to PT. Describes pain as a 7/10. Has been taking pain meds for pain. Still has night pain. Patient denies any fever, chills, chest pain, shortness of breath or calf pain. The remainder of the review of systems is negative. There are no new illness or injuries to report since last seen in the office. No changes in medications, allergies, social or family history.      PHYSICAL EXAM:   Visit Vitals  Pulse 90   Temp (!) 96 ??F (35.6 ??C) (Temporal)   Ht 5' 3.5" (1.613 m)   Wt 307 lb 3.2 oz (139.3 kg)   SpO2 100%   BMI 53.56 kg/m??      The patient is a well-developed, well-nourished female in no acute distress.  The patient is alert and oriented times three. The patient appears to be well groomed. Mood and affect are normal.  ORTHOPEDIC EXAM of left shoulder:  Inspection: swelling present,  Bruising not present  Incision, clean, dry, intact, sutures in place  Passive glenohumeral abduction 0-35 degrees  Stability: Stable  Strength: n/a  2+ distal pulses    IMPRESSION:  S/P Left shoulder arthroscopic 1cm rotator cuff repair, subscapularis repair    PLAN:   Incisions cleaned. Surgery was discussed at length today. Patient to continue with PROM and PT. Continue wearing sling. Stressed to patient that nothing causes an increase in pain.    RTC 3 weeks    Rogelia Boga, PA-C  [facility]

## 2019-05-09 NOTE — Patient Instructions (Signed)
You may now shower and get your incisions wet. We recommend starting scar massage in 1 week to your incision(s). Take Vitamin E, Cocoa Butter or Scar Cream and massage the incision 2 times a day. This will help soften your incisions and help de-sensitize the skin as the nerves "wake up".

## 2019-05-09 NOTE — Progress Notes (Signed)
Progress Notes by Rogelia Boga, PA at 05/09/19 1100                Author: Rogelia Boga, [place]  Service: --  Author Type: Physician Assistant       Filed: 05/09/19 1135  Encounter Date: 05/09/2019  Status: Signed          Editor: Rogelia Boga, PA (Physician Assistant)                          [name]   [birth year]       HISTORY OF PRESENT ILLNESS   Tiffany [name] is a 49 y.o.  female who presents today for evaluation s/p Left shoulder arthroscopic 1cm rotator cuff repair, subscapularis repair on 05/03/19. Patient has been going to PT. Describes pain as a 7/10. Has been taking  pain meds for pain. Still has night pain. Patient denies any fever, chills, chest pain, shortness of breath or calf pain. The remainder of the review of systems is negative. There are no new illness or injuries to report since last seen in the office.  No changes in medications, allergies, social or family history.        PHYSICAL EXAM:    Visit Vitals      Pulse  90        Temp  (!) 96 ??F (35.6 ??C) (Temporal)     Ht  5' 3.5" (1.613 m)     Wt  307 lb 3.2 oz (139.3 kg)     SpO2  100%        BMI  53.56 kg/m??         The patient is a well-developed, well-nourished female  in no acute distress.  The patient is alert and oriented times three. The patient appears to be well groomed. Mood and affect are normal.   ORTHOPEDIC EXAM of left shoulder:  Inspection: swelling present ,  Bruising not present   Incision, clean, dry, intact, sutures in place  Passive glenohumeral abduction 0-35 degrees  Stability: Stable  Strength: n/a   2+ distal pulses      IMPRESSION:  S/P Left shoulder arthroscopic 1cm rotator cuff repair, subscapularis repair      PLAN:    Incisions cleaned. Surgery was discussed at length today. Patient to continue with PROM and PT. Continue wearing sling. Stressed to patient that nothing causes an increase in pain.      RTC 3 weeks      Rogelia Boga, PA-C   [facility]

## 2019-05-10 ENCOUNTER — Inpatient Hospital Stay: Payer: MEDICARE | Primary: Nurse Practitioner

## 2019-05-10 DIAGNOSIS — M25512 Pain in left shoulder: Secondary | ICD-10-CM

## 2019-05-13 ENCOUNTER — Encounter: Payer: MEDICARE | Primary: Nurse Practitioner

## 2019-05-15 ENCOUNTER — Encounter: Payer: MEDICARE | Primary: Nurse Practitioner

## 2019-05-17 ENCOUNTER — Telehealth
Admit: 2019-05-17 | Discharge: 2019-05-17 | Payer: MEDICARE | Attending: Nurse Practitioner | Primary: Nurse Practitioner

## 2019-05-17 ENCOUNTER — Telehealth: Attending: Nurse Practitioner | Primary: Nurse Practitioner

## 2019-05-17 DIAGNOSIS — Z09 Encounter for follow-up examination after completed treatment for conditions other than malignant neoplasm: Secondary | ICD-10-CM

## 2019-05-17 NOTE — Progress Notes (Signed)
[facility]  [place], [place]  [phone] [phone] fax      05/17/2019    Patient: Tiffany [name], February 13, 1970, [id number]       Primary MD: Alla Feeling, DNP    Subjective:   Tiffany [name], a 49 y.o. female, who presents by phone for hospital follow up.  Patient underwent a left shoulder rotator cuff repair by Dr. Domingo Pulse.  She went to physical therapy x1 to visit and had to pay $20 co-pay.  She then missed a visit with physical therapy.  She is now scheduled for another physical therapy appointment next week.  She is going to learn her home physical therapy exercises as she is unable to afford $20 per visit for physical therapy 2 and 3 times a week.  Currently her pain scores between a 6 and 7 out of 10.  She was prescribed oxycodone but this was not effective for her pain and she was started on hydrocodone.  She is taking 2-3 tabs per day for her pain. She states she has high tolerance for pain so even though she is taking the hydrocodone her pain level remains 6-7.  She is currently taking Xarelto x14 days since the day of her surgery.  She continues to take her Celebrex.      Her Medicare nurse came to visit her yesterday and said her A1c was 4.4.  They were unable to obtain a urine at that visit.    Past Medical History:   Diagnosis Date   ??? Arthritis    ??? Chronic constipation 07/25/2018    Dr Donovan Kail   ??? 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 Chronic pain of right knee 07/25/2018     Dr Vinson Moselle, ortho   ??? Constipation due to opioid therapy 07/25/2018   ??? DVT (deep venous thrombosis) ([place]) 2017    Left groin; Dr Sallee Provencal, vascular, places IVF preop   ??? Dysphagia     Dr Luster Landsberg   ??? Esophageal obstruction     Dr Luster Landsberg   ??? History of fall 09/05/2018    see brief documentation on 09/05/2018   ??? Insomnia 07/25/2018   ??? Morbid obesity ([place]) 03/11/2016   ??? Spasm of back muscles 07/25/2018   ??? 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 Darvocet A500 [Propoxyphene N-Acetaminophen] Hives       Current Outpatient Medications on File Prior to Visit   Medication Sig Dispense Refill   ??? pregabalin (Lyrica) 75 mg capsule Take 1 Cap by mouth two (2) times a day. Max Daily Amount: 150 mg. 10 Cap 0   ??? rivaroxaban (Xarelto) 10 mg tablet Take 1 Tab by mouth daily. 14 Tab 0   ??? celecoxib (CeleBREX) 200 mg capsule Take 1 Cap by mouth two (2) times daily (with meals). 60 Cap 2   ??? DULoxetine (CYMBALTA) 60 mg capsule Take 1 Cap by mouth daily. 30 Cap 1   ??? oxybutynin (DITROPAN) 5 mg tablet Take 1 Tab by mouth two (2) times a day. Indications: a condition where the urge to urinate results in urine leakage 180 Tab 0   ??? cyclobenzaprine (FLEXERIL) 10 mg tablet Take 1 Tab by mouth two (2) times daily as needed for Muscle Spasm(s). Indications:  muscle spasm 60 Tab 2   ??? traZODone (DESYREL) 100 mg tablet Take 1 Tab by mouth nightly. 90 Tab 0   ??? topiramate (Topamax) 25 mg tablet Take  by mouth two (2) times a day.     ??? bisacodyL (Dulcolax, bisacodyl,) 5 mg EC tablet Take 5 mg by mouth daily as needed for Constipation.       No current facility-administered medications on file prior to visit.         HPI    Objective:   PHYSICAL EXAMINATION:  Unable to perform due to phone visit only    Assessment:    Tiffany [name] who has risk factors including (see above previous medical hx) and:       ICD-10-CM ICD-9-CM    1. Hospital discharge follow-up  Z09 V67.59    2. S/P left rotator cuff repair  Z98.890 V45.89    3. Medication management  Z79.899 V58.69      1. Hospital discharge.  On 05/03/2019 patient underwent a left shoulder rotator cuff repair with Dr. Domingo Pulse.    2.  Status post left rotator cuff repair. She has been having some trouble with pain control while taking oxycodone.  Over the weekend she spoke with someone who stopped her oxycodone and started her on  hydrocodone.  Despite taking hydrocodone 2-3 tabs per day she continues to have pain 6-7 out of 10.  She was scheduled for physical therapy.  She completed 1 visit and had to pay $20 co-pay.  Her second visit she forgot to attend.  She is now scheduled for her third appointment next week.  She is going to inquire about home physical therapy as she cannot afford the $20 co-pays per visit.  She continues Xarelto therapy until 14 days postop.  She continues Celebrex therapy as well.  Instructed patient to discontinue Celebrex and to avoid any and all NSAIDs while taking Xarelto.  3.  Medication management.  Reviewed all new and chronic medications.  Updated MAR.    Plan:   Med reconciliation completed with patient. Questions were answered and patient verb understanding.    No orders of the defined types were placed in this encounter.    Current Outpatient Medications   Medication Sig Dispense Refill   ??? pregabalin (Lyrica) 75 mg capsule Take 1 Cap by mouth two (2) times a day. Max Daily Amount: 150 mg. 10 Cap 0   ??? rivaroxaban (Xarelto) 10 mg tablet Take 1 Tab by mouth daily. 14 Tab 0   ??? celecoxib (CeleBREX) 200 mg capsule Take 1 Cap by mouth two (2) times daily (with meals). 60 Cap 2   ??? DULoxetine (CYMBALTA) 60 mg capsule Take 1 Cap by mouth daily. 30 Cap 1   ??? oxybutynin (DITROPAN) 5 mg tablet Take 1 Tab by mouth two (2) times a day. Indications: a condition where the urge to urinate results in urine leakage 180 Tab 0   ??? cyclobenzaprine (FLEXERIL) 10 mg tablet Take 1 Tab by mouth two (2) times daily as needed for Muscle Spasm(s). Indications: muscle spasm 60 Tab 2   ??? traZODone (DESYREL) 100 mg tablet Take 1 Tab by mouth nightly. 90 Tab 0   ??? topiramate (Topamax) 25 mg tablet Take  by mouth two (2) times a day.     ??? bisacodyL (Dulcolax, bisacodyl,) 5 mg EC tablet Take 5 mg by mouth daily as needed for Constipation.         Follow-up and Dispositions    ??  Return if symptoms worsen or fail to improve.       Dr.  Donald Prose. Lacey Dotson, AGNP-C, DNP  [facility]    Total time: 12 min      Tiffany [name], who was evaluated through a synchronous (real-time) audio only encounter, and/or her healthcare decision maker, is aware that it is a billable service, with coverage as determined by her insurance carrier. She provided verbal consent to proceed: Yes, and patient identification was verified. It was conducted pursuant to the emergency declaration under the [place], [place] waiver authority and the [employer] Act. A caregiver was present when appropriate. Ability to conduct physical exam was limited. I was in the office. The patient was at home.

## 2019-05-17 NOTE — Progress Notes (Signed)
[facility]  [place], [place]  [phone] office/[phone] fax      05/17/2019    Patient: Tiffany [name], 22-Nov-1970, [id number]       Primary MD: Alla Feeling, DNP    Subjective:   Tiffany [name], a 49 y.o. female, who presents by phone for hospital follow up.  Patient underwent a left shoulder rotator cuff repair by Dr. Domingo Pulse.  She went to physical therapy x1 to visit and had to pay $20 co-pay.  She then missed a visit with physical therapy.  She is now scheduled for another physical therapy appointment next week.  She is going to learn her home physical therapy exercises as she is unable to afford $20 per visit for physical therapy 2 and 3 times a week.  Currently her pain scores between a 6 and 7 out of 10.  She was prescribed oxycodone but this was not effective for her pain and she was started on hydrocodone.  She is taking 2-3 tabs per day for her pain. She states she has high tolerance for pain so even though she is taking the hydrocodone her pain level remains 6-7.  She is currently taking Xarelto x14 days since the day of her surgery.  She continues to take her Celebrex.      Her Medicare nurse came to visit her yesterday and said her A1c was 4.4.  They were unable to obtain a urine at that visit.    Past Medical History:   Diagnosis Date   ??? Arthritis    ??? Chronic constipation 07/25/2018    Dr Donovan Kail   ??? 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 Chronic pain of right knee 07/25/2018     Dr Vinson Moselle, ortho   ??? Constipation due to opioid therapy 07/25/2018   ??? DVT (deep venous thrombosis) ([place]) 2017    Left groin; Dr Sallee Provencal, vascular, places IVF preop   ??? Dysphagia     Dr Luster Landsberg   ??? Esophageal obstruction     Dr Luster Landsberg   ??? History of fall 09/05/2018    see brief documentation on 09/05/2018   ??? Insomnia 07/25/2018   ??? Morbid obesity ([place]) 03/11/2016   ??? Spasm of back muscles 07/25/2018   ??? 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 Darvocet A500 [Propoxyphene N-Acetaminophen] Hives       Current Outpatient Medications on File Prior to Visit   Medication Sig Dispense Refill   ??? pregabalin (Lyrica) 75 mg capsule Take 1 Cap by mouth two (2) times a day. Max Daily Amount: 150 mg. 10 Cap 0   ??? rivaroxaban (Xarelto) 10 mg tablet Take 1 Tab by mouth daily. 14 Tab 0   ??? celecoxib (CeleBREX) 200 mg capsule Take 1 Cap by mouth two (2) times daily (with meals). 60 Cap 2   ??? DULoxetine (CYMBALTA) 60 mg capsule Take 1 Cap by mouth daily. 30 Cap 1   ??? oxybutynin (DITROPAN) 5 mg tablet Take 1 Tab by mouth two (2) times a day. Indications: a condition where the urge to urinate results in urine leakage 180 Tab 0   ??? cyclobenzaprine (FLEXERIL) 10 mg tablet Take 1 Tab by mouth two (2) times daily as needed for Muscle Spasm(s). Indications:  muscle spasm 60 Tab 2   ??? traZODone (DESYREL) 100 mg tablet Take 1 Tab by mouth nightly. 90 Tab 0   ??? topiramate (Topamax) 25 mg tablet Take  by mouth two (2) times a day.     ??? bisacodyL (Dulcolax, bisacodyl,) 5 mg EC tablet Take 5 mg by mouth daily as needed for Constipation.       No current facility-administered medications on file prior to visit.         HPI    Objective:   PHYSICAL EXAMINATION:  Unable to perform due to phone visit only    Assessment:    Tiffany [name] who has risk factors including (see above previous medical hx) and:       ICD-10-CM ICD-9-CM    1. Hospital discharge follow-up  Z09 V67.59    2. S/P left rotator cuff repair  Z98.890 V45.89    3. Medication management  Z79.899 V58.69      1. Hospital discharge.  On 05/03/2019 patient underwent a left shoulder rotator cuff repair with Dr. Domingo Pulse.    2.  Status post left rotator cuff repair. She has been having some trouble with pain control while taking oxycodone.  Over the weekend she spoke with someone who stopped her oxycodone and started her on  hydrocodone.  Despite taking hydrocodone 2-3 tabs per day she continues to have pain 6-7 out of 10.  She was scheduled for physical therapy.  She completed 1 visit and had to pay $20 co-pay.  Her second visit she forgot to attend.  She is now scheduled for her third appointment next week.  She is going to inquire about home physical therapy as she cannot afford the $20 co-pays per visit.  She continues Xarelto therapy until 14 days postop.  She continues Celebrex therapy as well.  Instructed patient to discontinue Celebrex and to avoid any and all NSAIDs while taking Xarelto.  3.  Medication management.  Reviewed all new and chronic medications.  Updated MAR.    Plan:   Med reconciliation completed with patient. Questions were answered and patient verb understanding.    No orders of the defined types were placed in this encounter.    Current Outpatient Medications   Medication Sig Dispense Refill   ??? pregabalin (Lyrica) 75 mg capsule Take 1 Cap by mouth two (2) times a day. Max Daily Amount: 150 mg. 10 Cap 0   ??? rivaroxaban (Xarelto) 10 mg tablet Take 1 Tab by mouth daily. 14 Tab 0   ??? celecoxib (CeleBREX) 200 mg capsule Take 1 Cap by mouth two (2) times daily (with meals). 60 Cap 2   ??? DULoxetine (CYMBALTA) 60 mg capsule Take 1 Cap by mouth daily. 30 Cap 1   ??? oxybutynin (DITROPAN) 5 mg tablet Take 1 Tab by mouth two (2) times a day. Indications: a condition where the urge to urinate results in urine leakage 180 Tab 0   ??? cyclobenzaprine (FLEXERIL) 10 mg tablet Take 1 Tab by mouth two (2) times daily as needed for Muscle Spasm(s). Indications: muscle spasm 60 Tab 2   ??? traZODone (DESYREL) 100 mg tablet Take 1 Tab by mouth nightly. 90 Tab 0   ??? topiramate (Topamax) 25 mg tablet Take  by mouth two (2) times a day.     ??? bisacodyL (Dulcolax, bisacodyl,) 5 mg EC tablet Take 5 mg by mouth daily as needed for Constipation.         Follow-up and Dispositions    ??  Return if symptoms worsen or fail to improve.       Dr.  Donald Prose. Tegan Britain, AGNP-C, DNP  [facility]    Total time: 12 min      Tiffany [name], who was evaluated through a synchronous (real-time) audio only encounter, and/or her healthcare decision maker, is aware that it is a billable service, with coverage as determined by her insurance carrier. She provided verbal consent to proceed: Yes, and patient identification was verified. It was conducted pursuant to the emergency declaration under the [place] waiver authority and the [employer] Act. A caregiver was present when appropriate. Ability to conduct physical exam was limited. I was in the office. The patient was at home.

## 2019-05-21 ENCOUNTER — Inpatient Hospital Stay: Admit: 2019-05-21 | Payer: MEDICARE | Primary: Nurse Practitioner

## 2019-05-21 NOTE — Progress Notes (Signed)
PT DAILY TREATMENT NOTE 11-20    Patient Name: Tiffany [name]  Date:05/21/2019  DOB: 06/20/1970  [x]   Patient DOB Verified  Payor: AARP MEDICARE COMPLETE / Plan: [facility] AARP MEDICARE COMPLETE / Product Type: Managed Care Medicare /    In time:1013  Out time:1047  Total Treatment Time (min): 34  Visit #: 2 of 12    Medicare/BCBS Only   Total Timed Codes (min):  24 1:1 Treatment Time:  24       Treatment Area: Pain in left shoulder [M25.512]    SUBJECTIVE  Pain Level (0-10 scale): 6-7  Any medication changes, allergies to medications, adverse drug reactions, diagnosis change, or new procedure performed?: [x]  No    []  Yes (see summary sheet for update)  Subjective functional status/changes:   []  No changes reported  Patient reported left shoulder pain upon arrival    OBJECTIVE    Modality rationale: decrease inflammation and decrease pain to improve the patient???s ability to perform ADLs   Min Type Additional Details    []  Estim:  [] Unatt       [] IFC  [] Premod                        [] Other:  [] w/ice   [] w/heat  Position:  Location:    []  Estim: [] Att    [] TENS instruct  [] NMES                    [] Other:  [] w/US   [] w/ice   [] w/heat  Position:  Location:    []   Traction: []  Cervical       [] Lumbar                       []  Prone          [] Supine                       [] Intermittent   [] Continuous Lbs:  []  before manual  []  after manual    []   Ultrasound: [] Continuous   []  Pulsed                           [] 1MHz   [] 3MHz W/cm2:  Location:    []   Iontophoresis with dexamethasone         Location: []  Take home patch   []  In clinic   10 [x]   Ice     []   heat  []   Ice massage  []   Laser   []   Anodyne Position:supine  Location:left shoulder    []   Laser with stim  []   Other:  Position:  Location:    []   Vasopneumatic Device Pressure:       []  lo []  med []  hi   Temperature: []  lo []  med []  hi   []  Skin assessment post-treatment:  [] intact [] redness- no adverse reaction    [] redness ??? adverse reaction:         14 min Therapeutic  Exercise:  [x]  See flow sheet :   Rationale: increase ROM and increase strength to improve the patient???s ability to perform ADLs      10 min Manual Therapy:  PROM left shoulder, STJ mobs and TPR left UT/LS and along medial boarder fo scapula    The manual therapy interventions were performed at a separate and distinct time from the therapeutic activities interventions.  Rationale: decrease pain, increase ROM, increase tissue extensibility and decrease trigger points to perform ADLs         With   []  TE   []  TA   []  neuro   []  other: Patient Education: [x]  Review HEP    []  Progressed/Changed HEP based on:   []  positioning   []  body mechanics   []  transfers   []  heat/ice application    []  other:      Other Objective/Functional Measures: initiated treatment per POC and flow sheet     Pain Level (0-10 scale) post treatment: 5    ASSESSMENT/Changes in Function: initiated treatment per flow sheet and POC with good results as PROM and pain level improved post treatment.    Patient will continue to benefit from skilled PT services to modify and progress therapeutic interventions, address functional mobility deficits, address ROM deficits, address strength deficits, analyze and address soft tissue restrictions and analyze and cue movement patterns to attain remaining goals.     [x]   See Plan of Care  []   See progress note/recertification  []   See Discharge Summary         Progress towards goals / Updated goals:  Short Term Goals: To be accomplished in 1 weeks:  1.  Patient will become proficient in their HEP and will be compliant in performing that program.  Evaluation:   Patient given a written/illustrated HEP.  2.  Patient will demonstrate PROM left shoulder flexion and scaption 0-100 degrees in order to increase functional mobility.  Evaluation:  PROM flex 0-90; scaption 0-70  ??  Long Term Goals: To be accomplished in 4 weeks:  1. Patient's pain level will be 3-4/10 with activity in order to improve patient's ability to  perform normal ADLs.  Evaluation:  7/10-10/10.  2. Patient will demonstrate PROM left shoulder flex 0-140, scaption 0-140 to increase ease of ADLs.  Evaluation:  PROM left shoulder flexion 0-90, scaption 0-70  3. Patient will increase FOTO score to 60 to indicate increased functional mobility.  Evaluation: FOTO = 34  4. Patient will be able to reach to the bottom shelf of a kitchen cabinet in order to perform normal ADLs.  Evaluation:  NT per protocol    PLAN  []   Upgrade activities as tolerated     [x]   Continue plan of care  []   Update interventions per flow sheet       []   Discharge due to:_  []   Other:_      Alphia Kava, PTA 05/21/2019  10:48 AM    Future Appointments   Date Time Provider [place]   05/24/2019  9:30 AM Alphia Kava, PTA [facility]   05/28/2019  8:00 AM Rodman Comp, PTA [facility]   05/30/2019 10:30 AM Rogelia Boga, PA [facility] AMB   05/30/2019  2:00 PM Rodman Comp, [place] [facility]   06/05/2019  2:00 PM Alla Feeling, [place] [facility] AMB   05/06/2020 12:30 PM Fredirick Maudlin, MD [facility] BS AMB

## 2019-05-21 NOTE — Progress Notes (Signed)
PT DAILY TREATMENT NOTE 11-20    Patient Name: Tiffany [name]  Date:05/21/2019  DOB: 1970-05-13  [x]   Patient DOB Verified  Payor: AARP MEDICARE COMPLETE / Plan: [facility] AARP MEDICARE COMPLETE / Product Type: Managed Care Medicare /    In time:1013  Out time:1047  Total Treatment Time (min): 34  Visit #: 2 of 12    Medicare/BCBS Only   Total Timed Codes (min):  24 1:1 Treatment Time:  24       Treatment Area: Pain in left shoulder [M25.512]    SUBJECTIVE  Pain Level (0-10 scale): 6-7  Any medication changes, allergies to medications, adverse drug reactions, diagnosis change, or new procedure performed?: [x]  No    []  Yes (see summary sheet for update)  Subjective functional status/changes:   []  No changes reported  Patient reported left shoulder pain upon arrival    OBJECTIVE    Modality rationale: decrease inflammation and decrease pain to improve the patient's ability to perform ADLs   Min Type Additional Details    []  Estim:  [] Unatt       [] IFC  [] Premod                        [] Other:  [] w/ice   [] w/heat  Position:  Location:    []  Estim: [] Att    [] TENS instruct  [] NMES                    [] Other:  [] w/US   [] w/ice   [] w/heat  Position:  Location:    []   Traction: []  Cervical       [] Lumbar                       []  Prone          [] Supine                       [] Intermittent   [] Continuous Lbs:  []  before manual  []  after manual    []   Ultrasound: [] Continuous   []  Pulsed                           []   [] W/cm2:  Location:    []   Iontophoresis with dexamethasone         Location: []  Take home patch   []  In clinic   10 [x]   Ice     []   heat  []   Ice massage  []   Laser   []   Anodyne Position:supine  Location:left shoulder    []   Laser with stim  []   Other:  Position:  Location:    []   Vasopneumatic Device Pressure:       []  lo []  med []  hi   Temperature: []  lo []  med []  hi   []  Skin assessment post-treatment:  [] intact [] redness- no adverse reaction    [] redness - adverse reaction:         14 min Therapeutic  Exercise:  [x]  See flow sheet :   Rationale: increase ROM and increase strength to improve the patient's ability to perform ADLs      10 min Manual Therapy:  PROM left shoulder, STJ mobs and TPR left UT/LS and along medial boarder fo scapula    The manual therapy interventions were performed at a separate and distinct time from the therapeutic activities interventions.  Rationale: decrease pain, increase ROM, increase tissue extensibility and decrease trigger points to perform ADLs         With   []  TE   []  TA   []  neuro   []  other: Patient Education: [x]  Review HEP    []  Progressed/Changed HEP based on:   []  positioning   []  body mechanics   []  transfers   []  heat/ice application    []  other:      Other Objective/Functional Measures: initiated treatment per POC and flow sheet     Pain Level (0-10 scale) post treatment: 5    ASSESSMENT/Changes in Function: initiated treatment per flow sheet and POC with good results as PROM and pain level improved post treatment.    Patient will continue to benefit from skilled PT services to modify and progress therapeutic interventions, address functional mobility deficits, address ROM deficits, address strength deficits, analyze and address soft tissue restrictions and analyze and cue movement patterns to attain remaining goals.     [x]   See Plan of Care  []   See progress note/recertification  []   See Discharge Summary         Progress towards goals / Updated goals:  Short Term Goals: To be accomplished in 1 weeks:  1.  Patient will become proficient in their HEP and will be compliant in performing that program.  Evaluation:   Patient given a written/illustrated HEP.  2.  Patient will demonstrate PROM left shoulder flexion and scaption 0-100 degrees in order to increase functional mobility.  Evaluation:  PROM flex 0-90; scaption 0-70    Long Term Goals: To be accomplished in 4 weeks:  1. Patient's pain level will be 3-4/10 with activity in order to improve patient's ability to  perform normal ADLs.  Evaluation:  7/10-10/10.  2. Patient will demonstrate PROM left shoulder flex 0-140, scaption 0-140 to increase ease of ADLs.  Evaluation:  PROM left shoulder flexion 0-90, scaption 0-70  3. Patient will increase FOTO score to 60 to indicate increased functional mobility.  Evaluation: FOTO = 34  4. Patient will be able to reach to the bottom shelf of a kitchen cabinet in order to perform normal ADLs.  Evaluation:  NT per protocol    PLAN  []   Upgrade activities as tolerated     [x]   Continue plan of care  []   Update interventions per flow sheet       []   Discharge due to:_  []   Other:_      Epifania Gore, PTA 05/21/2019  10:48 AM    Future Appointments   Date Time Provider Department [facility]   05/24/2019  9:30 AM Epifania Gore, PTA [facility]   05/28/2019  8:00 AM Beaulah Dinning, PTA [facility]   05/30/2019 10:30 AM Burnett Corrente, PA [facility] AMB   05/30/2019  2:00 PM Beaulah Dinning, [place] [facility]   06/05/2019  2:00 PM Verdon Cummins, [place] [facility] BS AMB   05/06/2020 12:30 PM Lenox Ahr, MD [facility] AMB

## 2019-05-23 NOTE — Telephone Encounter (Signed)
We recommend icing. She cannot take both at the same time. Goal is to get off narcotics completely

## 2019-05-23 NOTE — Telephone Encounter (Signed)
Patient is experiencing pain in her shoulder after driving yesterday and is asking if she can take some of her leftover oxycodone instead of the hydrocodone - she had some trouble sleeping last night due to pain.  Please advise patient at [phone].

## 2019-05-24 ENCOUNTER — Inpatient Hospital Stay: Admit: 2019-05-24 | Payer: MEDICARE | Primary: Nurse Practitioner

## 2019-05-24 NOTE — Telephone Encounter (Signed)
Spoke to patient and she states that the Norco makes her itch severely so she is going to stop the Norco and take her left over oxycodone as needed. She doesn't think she needs pain medicine right now anyway so she will just try icing it for now.

## 2019-05-24 NOTE — Progress Notes (Addendum)
PT DAILY TREATMENT NOTE 11-20    Patient Name: Tiffany [name]  Date:05/24/2019  DOB: October 25, 1970  [x]   Patient DOB Verified  Payor: AARP MEDICARE COMPLETE / Plan: [place] / Product Type: Managed Care Medicare /    In time:935  Out time:1020  Total Treatment Time (min): 45  Visit #: 3 of 12    Medicare/BCBS Only   Total Timed Codes (min):  35 1:1 Treatment Time:  35       Treatment Area: Pain in left shoulder [M25.512]    SUBJECTIVE  Pain Level (0-10 scale): 8  Any medication changes, allergies to medications, adverse drug reactions, diagnosis change, or new procedure performed?: [x]  No    []  Yes (see summary sheet for update)  Subjective functional status/changes:   []  No changes reported  Patient reported she accidentally pulled herself up into her truck yesterday and hurt her shoulder.    OBJECTIVE    Modality rationale: decrease inflammation and decrease pain to improve the patient???s ability to perform ADLs   Min Type Additional Details    []  Estim:  [] Unatt       [] IFC  [] Premod                        [] Other:  [] w/ice   [] w/heat  Position:  Location:    []  Estim: [] Att    [] TENS instruct  [] NMES                    [] Other:  [] w/US   [] w/ice   [] w/heat  Position:  Location:    []   Traction: []  Cervical       [] Lumbar                       []  Prone          [] Supine                       [] Intermittent   [] Continuous Lbs:  []  before manual  []  after manual    []   Ultrasound: [] Continuous   []  Pulsed                           [] 1MHz   [] 3MHz W/cm2:  Location:    []   Iontophoresis with dexamethasone         Location: []  Take home patch   []  In clinic   10 [x]   Ice     []   heat  []   Ice massage  []   Laser   []   Anodyne Position:seated  Location:left shoulder    []   Laser with stim  []   Other:  Position:  Location:    []   Vasopneumatic Device Pressure:       []  lo []  med []  hi   Temperature: []  lo []  med []  hi   []  Skin assessment post-treatment:  [] intact [] redness- no adverse reaction     [] redness ??? adverse reaction:         15 min Therapeutic Exercise:  [x]  See flow sheet :   Rationale: increase ROM and increase strength to improve the patient???s ability to perform ADLs    20 min Manual Therapy:  TPR and STM left UT/LS, subscap and medial boarder of scapula.  Gentle posterior and inferior GHJ mobs grade I and PROM left shoulder per protocol   The  manual therapy interventions were performed at a separate and distinct time from the therapeutic activities interventions.  Rationale: increase ROM, increase tissue extensibility and decrease trigger points to perform ADLs              With   []  TE   []  TA   []  neuro   []  other: Patient Education: [x]  Review HEP    []  Progressed/Changed HEP based on:   []  positioning   []  body mechanics   []  transfers   []  heat/ice application    []  other:      Other Objective/Functional Measures:   Loose end field and significantly increased PROM today. Patient reported increased warmth and pain localized anteriolateral aspect of left GH joint today - no increased warmth, bruising or swelling noted by PTA.     Pain Level (0-10 scale) post treatment: 5    ASSESSMENT/Changes in Function: Patient presented with increased pain in left shoulder post accidentally catching herself from losing her balance when getting into her truck yesterday.  Upon, assessment there was no new bruising, swelling or palpable warmth but patient now has significantly increased PROM with loose end field.  Reinforced restrictions per protocol including no AROM of left GHJ and no carrying weight with the left arm even when she is wearing sling (patient noted that she has carried groceries using left UE since surgery).  Patient stated she has F/u with MD next week and update on patient's recent status change.    Patient will continue to benefit from skilled PT services to continue treating pain and patient education to prevent further reinjury to attain remaining goals.     [x]   See Plan of Care  []    See progress note/recertification  []   See Discharge Summary         Progress towards goals / Updated goals:  Short Term Goals:??To be accomplished in 1??weeks:  1. ??Patient will become proficient in their HEP and will be compliant in performing that program.  Evaluation:??????Patient given a written/illustrated HEP.  2. ??Patient will demonstrate PROM left shoulder flexion and scaption 0-100 degrees in order to increase functional mobility.  Evaluation: ??PROM flex 0-90; scaption 0-70  ??  Long Term Goals:??To be accomplished in 4??weeks:  1. Patient's pain level will be??3-4/10??with activity in order to improve patient's ability to perform normal ADLs.  Evaluation:????7/10-10/10.  2. Patient will demonstrate??PROM??left??shoulder flex 0-140, scaption??0-140??to increase ease of ADLs.  Evaluation:????PROM left shoulder flexion 0-90, scaption 0-70  3. Patient will increase FOTO score to??60??to indicate increased functional mobility.  Evaluation:??FOTO = 34  4. Patient will be able to reach to the bottom shelf of a kitchen cabinet in order to perform normal ADLs.  Evaluation:????NT per protocol    PLAN  []   Upgrade activities as tolerated     [x]   Continue plan of care  []   Update interventions per flow sheet       []   Discharge due to:_  []   Other:_      Alphia Kava, PTA 05/24/2019  10:07 AM    Future Appointments   Date Time Provider [place]   05/28/2019  8:00 AM Rodman Comp, PTA [facility]   05/30/2019 10:30 AM Rogelia Boga, PA [facility] AMB   05/30/2019  2:00 PM Rodman Comp, [place] [facility]   06/05/2019  2:00 PM Alla Feeling, [place] [facility] BS AMB   05/06/2020 12:30 PM Fredirick Maudlin, MD [facility] BS AMB

## 2019-05-24 NOTE — Progress Notes (Signed)
 PT DAILY TREATMENT NOTE 11-20    Patient Name: Tiffany [name]  Date:05/24/2019  DOB: 06-20-1970  [x]   Patient DOB Verified  Payor: AARP MEDICARE COMPLETE / Plan: [facility] AARP MEDICARE COMPLETE / Product Type: Managed Care Medicare /    In time:935  Out time:1020  Total Treatment Time (min): 45  Visit #: 3 of 12    Medicare/BCBS Only   Total Timed Codes (min):  35 1:1 Treatment Time:  35       Treatment Area: Pain in left shoulder [M25.512]    SUBJECTIVE  Pain Level (0-10 scale): 8  Any medication changes, allergies to medications, adverse drug reactions, diagnosis change, or new procedure performed?: [x]  No    []  Yes (see summary sheet for update)  Subjective functional status/changes:   []  No changes reported  Patient reported she accidentally pulled herself up into her truck yesterday and hurt her shoulder.    OBJECTIVE    Modality rationale: decrease inflammation and decrease pain to improve the patient's ability to perform ADLs   Min Type Additional Details    []  Estim:  [] Unatt       [] IFC  [] Premod                        [] Other:  [] w/ice   [] w/heat  Position:  Location:    []  Estim: [] Att    [] TENS instruct  [] NMES                    [] Other:  [] w/US    [] w/ice   [] w/heat  Position:  Location:    []   Traction: []  Cervical       [] Lumbar                       []  Prone          [] Supine                       [] Intermittent   [] Continuous Lbs:  []  before manual  []  after manual    []   Ultrasound: [] Continuous   []  Pulsed                           []   [] W/cm2:  Location:    []   Iontophoresis with dexamethasone         Location: []  Take home patch   []  In clinic   10 [x]   Ice     []   heat  []   Ice massage  []   Laser   []   Anodyne Position:seated  Location:left shoulder    []   Laser with stim  []   Other:  Position:  Location:    []   Vasopneumatic Device Pressure:       []  lo []  med []  hi   Temperature: []  lo []  med []  hi   []  Skin assessment post-treatment:  [] intact [] redness- no adverse reaction     [] redness - adverse reaction:         15 min Therapeutic Exercise:  [x]  See flow sheet :   Rationale: increase ROM and increase strength to improve the patient's ability to perform ADLs    20 min Manual Therapy:  TPR and STM left UT/LS, subscap and medial boarder of scapula.  Gentle posterior and inferior GHJ mobs grade I and PROM left shoulder per protocol   The  manual therapy interventions were performed at a separate and distinct time from the therapeutic activities interventions.  Rationale: increase ROM, increase tissue extensibility and decrease trigger points to perform ADLs              With   []  TE   []  TA   []  neuro   []  other: Patient Education: [x]  Review HEP    []  Progressed/Changed HEP based on:   []  positioning   []  body mechanics   []  transfers   []  heat/ice application    []  other:      Other Objective/Functional Measures:   Loose end field and significantly increased PROM today. Patient reported increased warmth and pain localized anteriolateral aspect of left GH joint today - no increased warmth, bruising or swelling noted by PTA.     Pain Level (0-10 scale) post treatment: 5    ASSESSMENT/Changes in Function: Patient presented with increased pain in left shoulder post accidentally catching herself from losing her balance when getting into her truck yesterday.  Upon, assessment there was no new bruising, swelling or palpable warmth but patient now has significantly increased PROM with loose end field.  Reinforced restrictions per protocol including no AROM of left GHJ and no carrying weight with the left arm even when she is wearing sling (patient noted that she has carried groceries using left UE since surgery).  Patient stated she has F/u with MD next week and update on patient's recent status change.    Patient will continue to benefit from skilled PT services to continue treating pain and patient education to prevent further reinjury to attain remaining goals.     [x]   See Plan of Care  []    See progress note/recertification  []   See Discharge Summary         Progress towards goals / Updated goals:  Short Term Goals:To be accomplished in 1weeks:  1. Patient will become proficient in their HEP and will be compliant in performing that program.  Evaluation:Patient given a written/illustrated HEP.  2. Patient will demonstrate PROM left shoulder flexion and scaption 0-100 degrees in order to increase functional mobility.  Evaluation: PROM flex 0-90; scaption 0-70    Long Term Goals:To be accomplished in 4weeks:  1. Patient's pain level will be3-4/10with activity in order to improve patient's ability to perform normal ADLs.  Evaluation:7/10-10/10.  2. Patient will demonstratePROMleftshoulder flex 0-140, scaption0-140to increase ease of ADLs.  Evaluation:PROM left shoulder flexion 0-90, scaption 0-70  3. Patient will increase FOTO score to60to indicate increased functional mobility.  Evaluation:FOTO = 34  4. Patient will be able to reach to the bottom shelf of a kitchen cabinet in order to perform normal ADLs.  Evaluation:NT per protocol    PLAN  []   Upgrade activities as tolerated     [x]   Continue plan of care  []   Update interventions per flow sheet       []   Discharge due to:_  []   Other:_      Leita DELENA Haymaker, PTA 05/24/2019  10:07 AM    Future Appointments   Date Time Provider Department Center   05/28/2019  8:00 AM Jerrye Thersia BROCKS, PTA [facility]   05/30/2019 10:30 AM Dala Lauraine HERO, PA [facility]   05/30/2019  2:00 PM Jerrye Thersia BROCKS, [place] [facility]   06/05/2019  2:00 PM Zondra Alan HERO, [place] [facility] AM[facility]   05/06/2020 12:30 PM Venkatesan, Jayaraman, MD [facility] BS AMB

## 2019-05-28 ENCOUNTER — Inpatient Hospital Stay: Admit: 2019-05-28 | Payer: MEDICARE | Primary: Nurse Practitioner

## 2019-05-28 NOTE — Progress Notes (Signed)
[facility] ??? [place]              [address] [place] [address]  [phone]   [phone] fax    Discharge Summary  Patient name: Tiffany [name] Start of Care: 05/06/2019   Referral source: Luciano-Perez, Ernesto,* DOB: 05/14/1970   Medical/Treatment Diagnosis: Pain in left shoulder [M25.512]  Payor: AARP MEDICARE COMPLETE / Plan: [facility] AARP MEDICARE COMPLETE / Product Type: Managed Care Medicare /  Onset Date:05/03/2019 DOS     Prior Hospitalization: see medical history Provider#: [id number]   Medications: Verified on Patient Summary List    Comorbidities: Arthritis, Back Pain, BMI 54.2, Surgery   Prior Level of Function: Patient is on medical disability. She was doing a little housekeeping, light grocery shopping. ??She was able to do simple cooking.                Visits from Start of Care: 4    Missed Visits: 2  Reporting Period : 05/06/2019 to 06/26/2019    Summary of Care:  Short Term Goals:??To be accomplished in 1??weeks:  1. ??Patient will become proficient in their HEP and will be compliant in performing that program.  Evaluation:??????Patient given a written/illustrated HEP.  Current: Met: Pt reports performing HEP.   2. ??Patient will demonstrate PROM left shoulder flexion and scaption 0-100 degrees in order to increase functional mobility.  Evaluation: ??PROM flex 0-90; scaption 0-70  Current: MET; Left shoulder PROM flexion 125 deg, scaption 130 deg. 05/28/19  ??  Long Term Goals:??To be accomplished in 4??weeks:  1. Patient's pain level will be??3-4/10??with activity in order to improve patient's ability to perform normal ADLs.  Evaluation:????7/10-10/10.  2. Patient will demonstrate??PROM??left??shoulder flex 0-140, scaption??0-140??to increase ease of ADLs.  Evaluation:????PROM left shoulder flexion 0-90, scaption 0-70  3. Patient will increase FOTO score to??60??to indicate increased functional mobility.  Evaluation:??FOTO = 34  4. Patient will be able to reach to the bottom shelf of  a kitchen cabinet in order to perform normal ADLs.  Evaluation:????NT per protocol    Unable to update goals.  Patient held therapy pending reevaluation by her ortho doctor.  MRI was pending.  No updates received from patient.  D/C as it is now past 30 days.  Will reevaluate patient if and when appropriate.    ASSESSMENT/RECOMMENDATIONS:  [x]Discontinue therapy: [x]Patient was referred back to her doctor for reassessment.      []Patient is non-compliant or has abdicated      []Due to lack of appreciable progress towards set goals    Malic Rosten F Trenity Pha, PT 07/11/2019 7:10 AM

## 2019-05-28 NOTE — Progress Notes (Signed)
PT DAILY TREATMENT NOTE 11-20    Patient Name: Tiffany [name]  Date:05/28/2019  DOB: 1970-07-24  [x]   Patient DOB Verified  Payor: AARP MEDICARE COMPLETE / Plan: [place] / Product Type: Managed Care Medicare /    In time:8:01  Out time:8:44  Total Treatment Time (min): 43  Visit #: 4 of 12    Medicare/BCBS Only   Total Timed Codes (min):  33 1:1 Treatment Time:  33       Treatment Area: Pain in left shoulder [M25.512]    SUBJECTIVE  Pain Level (0-10 scale): 7  Any medication changes, allergies to medications, adverse drug reactions, diagnosis change, or new procedure performed?: [x]  No    []  Yes (see summary sheet for update)  Subjective functional status/changes:   []  No changes reported  Patient reports she does not sleep in the sling because it is more comfortable to sleep with it propped up on pillows.     OBJECTIVE    Modality rationale: decrease pain to improve the patient???s ability to decrease difficulty while performing tasks.    Min Type Additional Details   10 [x]   Ice     []   heat  []   Ice massage  []   Laser   []   Anodyne Position:sitting  Location:shoulder   []  Skin assessment post-treatment:  [] intact [] redness- no adverse reaction    [] redness ??? adverse reaction:       20 min Therapeutic Exercise:  [x]  See flow sheet :   Rationale: increase ROM and increase strength to improve the patient???s ability to increase ease with ADLs.       13 min Manual Therapy:    Supine- left GHJ mobs grade I-II inferior and AP  Supine- PROM left shoulder in planes per protocol.    The manual therapy interventions were performed at a separate and distinct time from the therapeutic activities interventions.  Rationale: decrease pain, increase ROM, increase tissue extensibility and decrease trigger points to increase tolerance to activities.        With   []  TE   []  TA   []  neuro   []  other: Patient Education: [x]  Review HEP    []  Progressed/Changed HEP based on:   []  positioning   []  body mechanics   []   transfers   []  heat/ice application    []  other:      Other Objective/Functional Measures:  Left shoulder PROM flexion 125 deg, scaption 130 deg.     Pain Level (0-10 scale) post treatment:5    ASSESSMENT/Changes in Function: Patient requires cues to maintain PROM with transitional movements from sitting <> supine. Informed patient that she should be wearing the sling per protocol recommendation, which means even sleeping with the sling.     Patient will continue to benefit from skilled PT services to modify and progress therapeutic interventions, address functional mobility deficits, address ROM deficits, address strength deficits and analyze and address soft tissue restrictions to attain remaining goals.     []   See Plan of Care  []   See progress note/recertification  []   See Discharge Summary         Progress towards goals / Updated goals:  Short Term Goals:??To be accomplished in 1??weeks:  1. ??Patient will become proficient in their HEP and will be compliant in performing that program.  Evaluation:??????Patient given a written/illustrated HEP.  Current: Met: Pt reports performing HEP.   2. ??Patient will demonstrate PROM left shoulder flexion and scaption 0-100  degrees in order to increase functional mobility.  Evaluation: ??PROM flex 0-90; scaption 0-70  Current: MET; Left shoulder PROM flexion 125 deg, scaption 130 deg. 05/28/19  ??  Long Term Goals:??To be accomplished in 4??weeks:  1. Patient's pain level will be??3-4/10??with activity in order to improve patient's ability to perform normal ADLs.  Evaluation:????7/10-10/10.  2. Patient will demonstrate??PROM??left??shoulder flex 0-140, scaption??0-140??to increase ease of ADLs.  Evaluation:????PROM left shoulder flexion 0-90, scaption 0-70  3. Patient will increase FOTO score to??60??to indicate increased functional mobility.  Evaluation:??FOTO = 34  4. Patient will be able to reach to the bottom shelf of a kitchen cabinet in order to perform normal ADLs.  Evaluation:????NT per  protocol  ??    PLAN  []   Upgrade activities as tolerated     [x]   Continue plan of care  []   Update interventions per flow sheet       []   Discharge due to:_  []   Other:_      Rodman Comp, PTA 05/28/2019  8:04 AM    Future Appointments   Date Time Provider [place]   05/30/2019 10:30 AM Rogelia Boga, PA [facility] AMB   05/30/2019  2:00 PM Rodman Comp, [place] [facility]   06/05/2019  2:00 PM Alla Feeling, [place] [facility] AMB   05/06/2020 12:30 PM Fredirick Maudlin, MD [facility] AMB

## 2019-05-28 NOTE — Progress Notes (Signed)
 [facility] - [facility]              [address]        [place], [place] [address]  [phone]   [phone] fax    Discharge Summary  Patient name: Tiffany [name] Start of Care: 05/06/2019   Referral source: Vale Hover,* DOB: 27-Mar-1970   Medical/Treatment Diagnosis: Pain in left shoulder [M25.512]  Payor: AARP MEDICARE COMPLETE / Plan: [facility] AARP MEDICARE COMPLETE / Product Type: Managed Care Medicare /  Onset Date:05/03/2019 DOS     Prior Hospitalization: see medical history Provider#: [id number]   Medications: Verified on Patient Summary List    Comorbidities: Arthritis, Back Pain, BMI 54.2, Surgery   Prior Level of Function: Patient is on medical disability. She was doing a little housekeeping, light grocery shopping. She was able to do simple cooking.                Visits from Start of Care: 4    Missed Visits: 2  Reporting Period : 05/06/2019 to 06/26/2019    Summary of Care:  Short Term Goals:To be accomplished in 1weeks:  1. Patient will become proficient in their HEP and will be compliant in performing that program.  Evaluation:Patient given a written/illustrated HEP.  Current: Met: Pt reports performing HEP.   2. Patient will demonstrate PROM left shoulder flexion and scaption 0-100 degrees in order to increase functional mobility.  Evaluation: PROM flex 0-90; scaption 0-70  Current: MET; Left shoulder PROM flexion 125 deg, scaption 130 deg. 05/28/19    Long Term Goals:To be accomplished in 4weeks:  1. Patient's pain level will be3-4/10with activity in order to improve patient's ability to perform normal ADLs.  Evaluation:7/10-10/10.  2. Patient will demonstratePROMleftshoulder flex 0-140, scaption0-140to increase ease of ADLs.  Evaluation:PROM left shoulder flexion 0-90, scaption 0-70  3. Patient will increase FOTO score to60to indicate increased functional mobility.  Evaluation:FOTO = 34  4. Patient will be able to reach to the bottom shelf of  a kitchen cabinet in order to perform normal ADLs.  Evaluation:NT per protocol    Unable to update goals.  Patient held therapy pending reevaluation by her ortho doctor.  MRI was pending.  No updates received from patient.  D/C as it is now past 30 days.  Will reevaluate patient if and when appropriate.    ASSESSMENT/RECOMMENDATIONS:  [x] Discontinue therapy: [x] Patient was referred back to her doctor for reassessment.      [] Patient is non-compliant or has abdicated      [] Due to lack of appreciable progress towards set goals    Lupita JULIANNA Beams, PT 07/11/2019 7:10 AM

## 2019-05-28 NOTE — Progress Notes (Signed)
PT DAILY TREATMENT NOTE 11-20    Patient Name: Tiffany [name]  Date:05/28/2019  DOB: 05-04-1970  [x]   Patient DOB Verified  Payor: AARP MEDICARE COMPLETE / Plan: [facility] AARP MEDICARE COMPLETE / Product Type: Managed Care Medicare /    In time:8:01  Out time:8:44  Total Treatment Time (min): 43  Visit #: 4 of 12    Medicare/BCBS Only   Total Timed Codes (min):  33 1:1 Treatment Time:  33       Treatment Area: Pain in left shoulder [M25.512]    SUBJECTIVE  Pain Level (0-10 scale): 7  Any medication changes, allergies to medications, adverse drug reactions, diagnosis change, or new procedure performed?: [x]  No    []  Yes (see summary sheet for update)  Subjective functional status/changes:   []  No changes reported  Patient reports she does not sleep in the sling because it is more comfortable to sleep with it propped up on pillows.     OBJECTIVE    Modality rationale: decrease pain to improve the patient's ability to decrease difficulty while performing tasks.    Min Type Additional Details   10 [x]   Ice     []   heat  []   Ice massage  []   Laser   []   Anodyne Position:sitting  Location:shoulder   []  Skin assessment post-treatment:  [] intact [] redness- no adverse reaction    [] redness - adverse reaction:       20 min Therapeutic Exercise:  [x]  See flow sheet :   Rationale: increase ROM and increase strength to improve the patient's ability to increase ease with ADLs.       13 min Manual Therapy:    Supine- left GHJ mobs grade I-II inferior and AP  Supine- PROM left shoulder in planes per protocol.    The manual therapy interventions were performed at a separate and distinct time from the therapeutic activities interventions.  Rationale: decrease pain, increase ROM, increase tissue extensibility and decrease trigger points to increase tolerance to activities.        With   []  TE   []  TA   []  neuro   []  other: Patient Education: [x]  Review HEP    []  Progressed/Changed HEP based on:   []  positioning   []  body mechanics   []   transfers   []  heat/ice application    []  other:      Other Objective/Functional Measures:  Left shoulder PROM flexion 125 deg, scaption 130 deg.     Pain Level (0-10 scale) post treatment:5    ASSESSMENT/Changes in Function: Patient requires cues to maintain PROM with transitional movements from sitting <> supine. Informed patient that she should be wearing the sling per protocol recommendation, which means even sleeping with the sling.     Patient will continue to benefit from skilled PT services to modify and progress therapeutic interventions, address functional mobility deficits, address ROM deficits, address strength deficits and analyze and address soft tissue restrictions to attain remaining goals.     []   See Plan of Care  []   See progress note/recertification  []   See Discharge Summary         Progress towards goals / Updated goals:  Short Term Goals:To be accomplished in 1weeks:  1. Patient will become proficient in their HEP and will be compliant in performing that program.  Evaluation:Patient given a written/illustrated HEP.  Current: Met: Pt reports performing HEP.   2. Patient will demonstrate PROM left shoulder flexion and scaption 0-100  degrees in order to increase functional mobility.  Evaluation: PROM flex 0-90; scaption 0-70  Current: MET; Left shoulder PROM flexion 125 deg, scaption 130 deg. 05/28/19    Long Term Goals:To be accomplished in 4weeks:  1. Patient's pain level will be3-4/10with activity in order to improve patient's ability to perform normal ADLs.  Evaluation:7/10-10/10.  2. Patient will demonstratePROMleftshoulder flex 0-140, scaption0-140to increase ease of ADLs.  Evaluation:PROM left shoulder flexion 0-90, scaption 0-70  3. Patient will increase FOTO score to60to indicate increased functional mobility.  Evaluation:FOTO = 34  4. Patient will be able to reach to the bottom shelf of a kitchen cabinet in order to perform normal ADLs.  Evaluation:NT per  protocol      PLAN  []   Upgrade activities as tolerated     [x]   Continue plan of care  []   Update interventions per flow sheet       []   Discharge due to:_  []   Other:_      Beaulah Dinning, PTA 05/28/2019  8:04 AM    Future Appointments   Date Time Provider Department Center   05/30/2019 10:30 AM Burnett Corrente, PA [facility] AMB   05/30/2019  2:00 PM Beaulah Dinning, [place] [facility]   06/05/2019  2:00 PM Verdon Cummins, [place] [facility] AMB   05/06/2020 12:30 PM Lenox Ahr, MD [facility] AMB

## 2019-05-30 ENCOUNTER — Ambulatory Visit
Admit: 2019-05-30 | Discharge: 2019-05-30 | Payer: MEDICARE | Attending: Physician Assistant | Primary: Nurse Practitioner

## 2019-05-30 ENCOUNTER — Encounter: Payer: MEDICARE | Primary: Nurse Practitioner

## 2019-05-30 ENCOUNTER — Ambulatory Visit: Attending: Physician Assistant | Primary: Nurse Practitioner

## 2019-05-30 DIAGNOSIS — M75102 Unspecified rotator cuff tear or rupture of left shoulder, not specified as traumatic: Secondary | ICD-10-CM

## 2019-05-30 MED ORDER — OXYCODONE-ACETAMINOPHEN 5 MG-325 MG TAB
5-325 mg | ORAL_TABLET | Freq: Four times a day (QID) | ORAL | 0 refills | Status: AC | PRN
Start: 2019-05-30 — End: 2019-06-06

## 2019-05-30 NOTE — Progress Notes (Signed)
[name]  1970/03/09     HISTORY OF PRESENT ILLNESS  Tiffany [name] is a 49 y.o. female who presents today for evaluation s/p Left shoulder arthroscopic 1cm rotator cuff repair, subscapularis repair on 05/03/19. Patient has been going to PT. Describes pain as a 7/10. She notes that earlier this week she was rushing to get into her truck and used her left arm. She has been having an increase in pain since. Therapy was then difficult. Patient denies any fever, chills, chest pain, shortness of breath or calf pain. The remainder of the review of systems is negative. There are no new illness or injuries to report since last seen in the office. No changes in medications, allergies, social or family history.      PHYSICAL EXAM:   Visit Vitals  Pulse 81   Resp 16   Ht 5' 3.5" (1.613 m)   Wt 303 lb (137.4 kg)   SpO2 98%   BMI 52.83 kg/m??      The patient is a well-developed, well-nourished female in no acute distress.  The patient is alert and oriented times three. The patient appears to be well groomed. Mood and affect are normal.  ORTHOPEDIC EXAM of left shoulder:  Inspection: swelling not present,  Bruising not present  Incisions well healed  Passive glenohumeral abduction 0-80 degrees  Stability: Stable  Strength: n/a  2+ distal pulses    IMPRESSION:  S/P Left shoulder arthroscopic 1cm rotator cuff repair, subscapularis repair    PLAN:   1. Patient with slight increase in pain the other day.  2. Will hold PT x 1 week. Pendulums and PROM only  3. rx for percocet given today. Patient given pain medication for short term acute pain relief. Goal is to treat patient according to above plan and to ultimately have patient off all pain medications once appropriate. If chronic pain management is required beyond what is expected for current orthopedic problem, will refer patient to pain management. PMP was reviewed and will be reviewed with every medication refill request.        RTC 1 week    Rogelia Boga, PA-C   [facility]

## 2019-05-30 NOTE — Patient Instructions (Addendum)
Hold on therapy x 1 week    Just gentle pendulums for this week

## 2019-05-30 NOTE — Progress Notes (Signed)
Progress Notes by Rogelia Boga, PA at 05/30/19 1030                Author: Rogelia Boga, [place]  Service: --  Author Type: Physician Assistant       Filed: 05/30/19 1056  Encounter Date: 05/30/2019  Status: Signed          Editor: Rogelia Boga, PA (Physician Assistant)                          Tiffany [name]   1970-08-28       HISTORY OF PRESENT ILLNESS   Tiffany [name] is a 49 y.o.  female who presents today for evaluation s/p Left shoulder arthroscopic 1cm rotator cuff repair, subscapularis repair on 05/03/19. Patient has been going to PT. Describes pain as a 7/10. She notes that  earlier this week she was rushing to get into her truck and used her left arm. She has been having an increase in pain since. Therapy was then difficult. Patient denies any fever, chills, chest pain, shortness of breath or calf pain. The remainder of  the review of systems is negative. There are no new illness or injuries to report since last seen in the office. No changes in medications, allergies, social or family history.        PHYSICAL EXAM:    Visit Vitals      Pulse  81        Resp  16     Ht  5' 3.5" (1.613 m)     Wt  303 lb (137.4 kg)     SpO2  98%        BMI  52.83 kg/m??         The patient is a well-developed, well-nourished female  in no acute distress.  The patient is alert and oriented times three. The patient appears to be well groomed. Mood and affect are normal.   ORTHOPEDIC EXAM of left shoulder:  Inspection: swelling not present ,  Bruising not present   Incisions well healed  Passive glenohumeral abduction 0-80 degrees  Stability: Stable  Strength: n/a   2+ distal pulses      IMPRESSION:  S/P Left shoulder arthroscopic 1cm rotator cuff repair, subscapularis repair      PLAN:    1. Patient with slight increase in pain the other day.   2. Will hold PT x 1 week. Pendulums and PROM only   3. rx for percocet given today. Patient given pain medication for short term acute pain relief. Goal is to treat  patient according to above plan and to ultimately have patient off  all pain medications once appropriate. If chronic pain management is required beyond what is expected for current orthopedic problem, will refer patient to pain management. PMP was reviewed and will be reviewed with every medication refill request.           RTC 1 week      Rogelia Boga, PA-C   [facility]

## 2019-06-04 ENCOUNTER — Encounter: Payer: MEDICARE | Primary: Nurse Practitioner

## 2019-06-05 ENCOUNTER — Telehealth
Admit: 2019-06-05 | Discharge: 2019-06-05 | Payer: MEDICARE | Attending: Nurse Practitioner | Primary: Nurse Practitioner

## 2019-06-05 ENCOUNTER — Encounter

## 2019-06-05 ENCOUNTER — Telehealth: Attending: Nurse Practitioner | Primary: Nurse Practitioner

## 2019-06-05 DIAGNOSIS — G4700 Insomnia, unspecified: Secondary | ICD-10-CM

## 2019-06-05 MED ORDER — TRAZODONE 100 MG TAB
100 mg | ORAL_TABLET | Freq: Every evening | ORAL | 1 refills | Status: DC
Start: 2019-06-05 — End: 2019-12-02

## 2019-06-05 MED ORDER — OXYBUTYNIN CHLORIDE 5 MG TAB
5 mg | ORAL_TABLET | Freq: Two times a day (BID) | ORAL | 1 refills | Status: DC
Start: 2019-06-05 — End: 2019-12-02

## 2019-06-05 NOTE — Progress Notes (Signed)
[facility]  [place], [place]  [phone] fax    06/05/2019    HPI:   Tiffany [name] 11-May-1970 is a pleasant BLACK/AFR[place] AMER[place] female who presents virtually for follow up.     -Chronic Constipation: started as a young child. Averages a BM weekly unless she takes dulcolax. Dulcolax is effective for BM. Feeling sluggish and tired. Inquired about Linzess.      -Insomnia: taking every night. Denies side effects with medication.     -Urinary urgency: continues ditropan w/o side effects or complications.     -Left Rotator Cuff Repair: Patient underwent a left shoulder rotator cuff repair by Dr. Domingo Pulse 05/03/2019. Was taking hydrocodone but this caused itchy. She was placed back on Oxycodone therapy. Pain score 6/10. Phy therapy was stopped due to swelling in the left shoulder area. Struggling to use LUE since surgery. She continues to follow up with Dr Milton Ferguson office.      -Chronic back pain: Follows Dr Nat Christen, pain mgt. Continues Topamax and celebrex.      Past Medical History:   Diagnosis Date   ??? Arthritis    ??? Chronic constipation 07/25/2018    Dr Donovan Kail   ??? 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 Chronic pain of right knee 07/25/2018     Dr Vinson Moselle, ortho   ??? Constipation due to opioid therapy 07/25/2018   ??? DVT (deep venous thrombosis) ([place]) 2017    Left groin; Dr Sallee Provencal, vascular, places IVF preop   ??? Dysphagia     Dr Luster Landsberg   ??? Esophageal obstruction     Dr Luster Landsberg   ??? History of fall 09/05/2018    see brief documentation on 09/05/2018   ??? Insomnia 07/25/2018   ??? Morbid obesity ([place]) 03/11/2016   ??? Spasm of back muscles 07/25/2018   ??? 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 Urinary urgency 06/05/2019     Past Surgical History:   Procedure Laterality Date   ??? COLONOSCOPY N/A 10/12/2017    COLONOSCOPY performed by Alferd Patee, MD at [facility]   ???  COLONOSCOPY N/A 10/12/2018    COLONOSCOPY performed by [facility]ferd P[facility]e, MD at [facility] ENDOSCOPY   ??? HX ADENOIDECTOMY     ??? HX CHOLECYSTECTOMY  2013   ??? HX COLONOSCOPY  1999    screening - neg results per patient   ??? HX ENDOSCOPY      screening - neg results per patient   ??? HX ENDOSCOPY  10/12/2018    Dr Judie Grieve, [facility]   ??? HX GYN      ectopic preg with tube rupture   ??? HX KNEE REPLACEMENT Right 12/2017     Dr Vinson Moselle, ortho   ??? HX LUMBAR FUSION  0000000    [id number] - complicated by vascular injury   ??? HX ORTHOPAEDIC      left foot surgery - stress fx   ??? HX ORTHOPAEDIC      L &R carpal tunnel   ??? HX TONSILLECTOMY     ??? IR IVC FILTER  2017    removed June 2017 noted in care everywhere   ??? PR ABDOMEN SURGERY PROC UNLISTED  2017    Open abdomen management (14 days) after vascular injury during lumbar fusion   ??? PR ANESTH,SURGERY OF SHOULDER  05/03/2019  left shoulder   ??? VASCULAR SURGERY PROCEDURE UNLIST      vascular repair of aortic/venocaval repair.      Current Outpatient Medications   Medication Sig   ??? traZODone (DESYREL) 100 mg tablet Take 1 Tab by mouth nightly.   ??? oxybutynin (DITROPAN) 5 mg tablet Take 1 Tab by mouth two (2) times a day. Indications: a condition where the urge to urinate results in urine leakage   ??? oxyCODONE-acetaminophen (Percocet) 5-325 mg per tablet Take 1 Tab by mouth every six (6) hours as needed for Pain for up to 7 days. Max Daily Amount: 4 Tabs.   ??? celecoxib (CeleBREX) 200 mg capsule Take 1 Cap by mouth two (2) times daily (with meals).   ??? DULoxetine (CYMBALTA) 60 mg capsule Take 1 Cap by mouth daily.   ??? cyclobenzaprine (FLEXERIL) 10 mg tablet Take 1 Tab by mouth two (2) times daily as needed for Muscle Spasm(s). Indications: muscle spasm   ??? topiramate (Topamax) 25 mg tablet Take  by mouth two (2) times a day.   ??? bisacodyL (Dulcolax, bisacodyl,) 5 mg EC tablet Take 5 mg by mouth daily as needed for Constipation.     No current facility-administered medications for this visit.      Allergies  and Intolerances:   Allergies   Allergen Reactions   ??? Darvocet A500 [Propoxyphene N-Acetaminophen] Hives   ??? Norco [Hydrocodone-Acetaminophen] Itching     Family History:   Family History   Problem Relation Age of Onset   ??? Hypertension Mother    ??? Heart Disease Father    ??? Hypertension Father    ??? Heart Disease Paternal Aunt    ??? Heart Disease Paternal Uncle    ??? Heart Disease Paternal Grandmother      Social History:   She  reports that she has never smoked. She has never used smokeless tobacco.   Social History     Substance and Sexual Activity   Alcohol Use No     Immunization History:    There is no immunization history on file for this patient.    Review of Systems:   As above included in HPI.  Otherwise 11 point review of systems negative including constitutional, skin, HENT, eyes, respiratory, cardiovascular, gastrointestinal, genitourinary, musculoskeletal, endocrine, hematologic, allergy, and neurologic.      Physical:   Exam:   Vital Signs: (As obtained by patient/caregiver at home)  There were not vitals taken for this visit.     PHYSICAL EXAMINATION:  [ INSTRUCTIONS:  "[x] " Indicates a positive item  "[] " Indicates a negative item  -- DELETE ALL ITEMS NOT EXAMINED]      Constitutional: [x]  Appears well-developed and well-nourished [x]  No apparent distress      []  Abnormal - morbidly obese    Mental status: [x]  Alert and awake  [x]  Oriented to person/place/time [x]  Able to follow commands    []  Abnormal -     Eyes:   EOM    [x]   Normal    []  Abnormal -   Sclera  [x]   Normal    []  Abnormal -          Discharge [x]   None visible   []  Abnormal -     HENT, Neck: [x]  Normocephalic, atraumatic  []  Abnormal - .  [x]  Mouth/Throat: Mucous membranes are moist    Pulmonary/Chest: [x]  Respiratory effort normal   [x]  No visualized signs of difficulty breathing or respiratory distress        []   Abnormal -      Musculoskeletal:   []  Normal gait with no signs of ataxia. Pt was sitting in car, unable to assess  ambulation         [x]  Normal range of motion of neck        [x]  Abnormal - Noted pt to use right hand to maneuver left arm due to pain due to pain. No sling noted.    Neurological:        [x]  No Facial Asymmetry (Cranial nerve 7 motor function) (limited exam due to video visit)          [x]  No gaze palsy        []  Abnormal -          Skin:        [x]  No significant exanthematous lesions or discoloration noted on facial skin         []  Abnormal -            Psychiatric:       [x]  Normal Affect []  Abnormal -        [x]  No Hallucinations    Review of Data:  Labs reviewed: N/A    Impression:  Patient Active Problem List   Diagnosis Code   ??? Morbid obesity ([place]) E66.01   ??? Insomnia G47.00   ??? Chronic low back pain with sciatica M54.40, G89.29   ??? First degree heart block by electrocardiogram I44.0   ??? Urinary urgency R39.15   ??? Chronic constipation K59.09       Plan:    ICD-10-CM ICD-9-CM    1. Insomnia, unspecified type  G47.00 780.52 traZODone (DESYREL) 100 mg tablet   2. Chronic constipation  K59.09 564.00    3. Urinary urgency  R39.15 788.63 oxybutynin (DITROPAN) 5 mg tablet   4. S/P left rotator cuff repair  Z98.890 V45.89    5. Morbid obesity ([place])  E66.01 278.01      1.  Insomnia.  Patient to continue trazodone therapy as prescribed.  Did instruct patient not to take trazodone when taking her oxycodone prior to bed.  Encourage patient to place trazodone on hold if needing to take the oxycodone at bedtime.  Patient verbalized understanding.    2.  Constipation.  Patient has suffered from constipation since childhood plus side effect of opioid therapy.  She was inquiring about Linzess and I explained to the patient a preauthorization is required and this order would most likely not be approved unless she has tried multiple over-the-counter regimens that were not effective.  Due to Dulcolax beening effective for her to have bowel movements, Linzess would not be covered under her insurance plan.  Instructed patient  to take Dulcolax 3-5 times per week as having a bowel movement 1 time a week is not healthy and is most likely what is causing her sluggishness along with recuperating from surgery.  Patient verbalized understanding    3.  Urinary urgency.  Refill Ditropan.    4.  Status post left rotator cuff repair.  Patient underwent left rotator cuff repair March 2021.  She has struggled with the healing process.  She follows up with Ortho as scheduled.  Her physical therapy has been placed on hold due to swelling to her left rotator area.  She continues oxycodone for her pain management.    5.  Morbid obesity.    Requested patient make a 48-month face-to-face appointment in order to complete her health maintenance and her Medicare wellness  exam.  Patient verbalized understanding.      Follow up 6 months  Labs needed 1 week prior to appt: No    Dr. Gaspar Bidding, AGNP-C, [place]  [facility]       50% of the time was spent with the patient on counseling, answering questions, and/or coordination of care. Complex medical review of medical history, lab results, and testing. Complicated management plan formulated.      Tiffany [name], who was evaluated through a synchronous (real-time) audio-video encounter, and/or her healthcare decision maker, is aware that it is a billable service, with coverage as determined by her insurance carrier. She provided verbal consent to proceed: Yes, and patient identification was verified. It was conducted pursuant to the emergency declaration under the [place], [place] waiver authority and the [employer] and [employer] Act. A caregiver was present when appropriate. Ability to conduct physical exam was limited. I was in the office. The patient was at home.

## 2019-06-05 NOTE — Telephone Encounter (Signed)
Pt has been scheduled per NP [facility]'s note below:    Return in about 6 months (around 12/05/2019) for Psych, Weight, AMW, Lab not needed.

## 2019-06-05 NOTE — Progress Notes (Signed)
[facility]  [place], [place]  [phone] fax    06/05/2019    HPI:   Tiffany [name] 02/05/1971 is a pleasant BLACK/AFR[place] AMER[place] female who presents virtually for follow up.     -Chronic Constipation: started as a young child. Averages a BM weekly unless she takes dulcolax. Dulcolax is effective for BM. Feeling sluggish and tired. Inquired about Linzess.      -Insomnia: taking every night. Denies side effects with medication.     -Urinary urgency: continues ditropan w/o side effects or complications.     -Left Rotator Cuff Repair: Patient underwent a left shoulder rotator cuff repair by Dr. Domingo Pulse 05/03/2019. Was taking hydrocodone but this caused itchy. She was placed back on Oxycodone therapy. Pain score 6/10. Phy therapy was stopped due to swelling in the left shoulder area. Struggling to use LUE since surgery. She continues to follow up with Dr Milton Ferguson office.      -Chronic back pain: Follows Dr Nat Christen, pain mgt. Continues Topamax and celebrex.      Past Medical History:   Diagnosis Date   ??? Arthritis    ??? Chronic constipation 07/25/2018    Dr Donovan Kail   ??? 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 Chronic pain of right knee 07/25/2018     Dr Vinson Moselle, ortho   ??? Constipation due to opioid therapy 07/25/2018   ??? DVT (deep venous thrombosis) ([place]) 2017    Left groin; Dr Sallee Provencal, vascular, places IVF preop   ??? Dysphagia     Dr Luster Landsberg   ??? Esophageal obstruction     Dr Luster Landsberg   ??? History of fall 09/05/2018    see brief documentation on 09/05/2018   ??? Insomnia 07/25/2018   ??? Morbid obesity ([place]) 03/11/2016   ??? Spasm of back muscles 07/25/2018   ??? 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 Urinary urgency 06/05/2019     Past Surgical History:   Procedure Laterality Date   ??? COLONOSCOPY N/A 10/12/2017    COLONOSCOPY performed by Alferd Patee, MD at [facility]   ???  COLONOSCOPY N/A 10/12/2018    COLONOSCOPY performed by Alferd Patee, MD at [facility] ENDOSCOPY   ??? HX ADENOIDECTOMY     ??? HX CHOLECYSTECTOMY  2013   ??? HX COLONOSCOPY  1999    screening - neg results per patient   ??? HX ENDOSCOPY      screening - neg results per patient   ??? HX ENDOSCOPY  10/12/2018    Dr Judie Grieve, [facility]   ??? HX GYN      ectopic preg with tube rupture   ??? HX KNEE REPLACEMENT Right 12/2017     Dr Vinson Moselle, ortho   ??? HX LUMBAR FUSION  0000000    [id number] - complicated by vascular injury   ??? HX ORTHOPAEDIC      left foot surgery - stress fx   ??? HX ORTHOPAEDIC      L &R carpal tunnel   ??? HX TONSILLECTOMY     ??? IR IVC FILTER  2017    removed June 2017 noted in care everywhere   ??? PR ABDOMEN SURGERY PROC UNLISTED  2017    Open abdomen management (14 days) after vascular injury during lumbar fusion   ??? PR ANESTH,SURGERY OF SHOULDER  05/03/2019  left shoulder   ??? VASCULAR SURGERY PROCEDURE UNLIST      vascular repair of aortic/venocaval repair.      Current Outpatient Medications   Medication Sig   ??? traZODone (DESYREL) 100 mg tablet Take 1 Tab by mouth nightly.   ??? oxybutynin (DITROPAN) 5 mg tablet Take 1 Tab by mouth two (2) times a day. Indications: a condition where the urge to urinate results in urine leakage   ??? oxyCODONE-acetaminophen (Percocet) 5-325 mg per tablet Take 1 Tab by mouth every six (6) hours as needed for Pain for up to 7 days. Max Daily Amount: 4 Tabs.   ??? celecoxib (CeleBREX) 200 mg capsule Take 1 Cap by mouth two (2) times daily (with meals).   ??? DULoxetine (CYMBALTA) 60 mg capsule Take 1 Cap by mouth daily.   ??? cyclobenzaprine (FLEXERIL) 10 mg tablet Take 1 Tab by mouth two (2) times daily as needed for Muscle Spasm(s). Indications: muscle spasm   ??? topiramate (Topamax) 25 mg tablet Take  by mouth two (2) times a day.   ??? bisacodyL (Dulcolax, bisacodyl,) 5 mg EC tablet Take 5 mg by mouth daily as needed for Constipation.     No current facility-administered medications for this visit.      Allergies  and Intolerances:   Allergies   Allergen Reactions   ??? Darvocet A500 [Propoxyphene N-Acetaminophen] Hives   ??? Norco [Hydrocodone-Acetaminophen] Itching     Family History:   Family History   Problem Relation Age of Onset   ??? Hypertension Mother    ??? Heart Disease Father    ??? Hypertension Father    ??? Heart Disease Paternal Aunt    ??? Heart Disease Paternal Uncle    ??? Heart Disease Paternal Grandmother      Social History:   She  reports that she has never smoked. She has never used smokeless tobacco.   Social History     Substance and Sexual Activity   Alcohol Use No     Immunization History:    There is no immunization history on file for this patient.    Review of Systems:   As above included in HPI.  Otherwise 11 point review of systems negative including constitutional, skin, HENT, eyes, respiratory, cardiovascular, gastrointestinal, genitourinary, musculoskeletal, endocrine, hematologic, allergy, and neurologic.      Physical:   Exam:   Vital Signs: (As obtained by patient/caregiver at home)  There were not vitals taken for this visit.     PHYSICAL EXAMINATION:  [ INSTRUCTIONS:  "[x] " Indicates a positive item  "[] " Indicates a negative item  -- DELETE ALL ITEMS NOT EXAMINED]      Constitutional: [x]  Appears well-developed and well-nourished [x]  No apparent distress      []  Abnormal - morbidly obese    Mental status: [x]  Alert and awake  [x]  Oriented to person/place/time [x]  Able to follow commands    []  Abnormal -     Eyes:   EOM    [x]   Normal    []  Abnormal -   Sclera  [x]   Normal    []  Abnormal -          Discharge [x]   None visible   []  Abnormal -     HENT, Neck: [x]  Normocephalic, atraumatic  []  Abnormal - .  [x]  Mouth/Throat: Mucous membranes are moist    Pulmonary/Chest: [x]  Respiratory effort normal   [x]  No visualized signs of difficulty breathing or respiratory distress        []   Abnormal -      Musculoskeletal:   []  Normal gait with no signs of ataxia. Pt was sitting in car, unable to assess  ambulation         [x]  Normal range of motion of neck        [x]  Abnormal - Noted pt to use right hand to maneuver left arm due to pain due to pain. No sling noted.    Neurological:        [x]  No Facial Asymmetry (Cranial nerve 7 motor function) (limited exam due to video visit)          [x]  No gaze palsy        []  Abnormal -          Skin:        [x]  No significant exanthematous lesions or discoloration noted on facial skin         []  Abnormal -            Psychiatric:       [x]  Normal Affect []  Abnormal -        [x]  No Hallucinations    Review of Data:  Labs reviewed: N/A    Impression:  Patient Active Problem List   Diagnosis Code   ??? Morbid obesity ([place]) E66.01   ??? Insomnia G47.00   ??? Chronic low back pain with sciatica M54.40, G89.29   ??? First degree heart block by electrocardiogram I44.0   ??? Urinary urgency R39.15   ??? Chronic constipation K59.09       Plan:    ICD-10-CM ICD-9-CM    1. Insomnia, unspecified type  G47.00 780.52 traZODone (DESYREL) 100 mg tablet   2. Chronic constipation  K59.09 564.00    3. Urinary urgency  R39.15 788.63 oxybutynin (DITROPAN) 5 mg tablet   4. S/P left rotator cuff repair  Z98.890 V45.89    5. Morbid obesity ([place])  E66.01 278.01      1.  Insomnia.  Patient to continue trazodone therapy as prescribed.  Did instruct patient not to take trazodone when taking her oxycodone prior to bed.  Encourage patient to place trazodone on hold if needing to take the oxycodone at bedtime.  Patient verbalized understanding.    2.  Constipation.  Patient has suffered from constipation since childhood plus side effect of opioid therapy.  She was inquiring about Linzess and I explained to the patient a preauthorization is required and this order would most likely not be approved unless she has tried multiple over-the-counter regimens that were not effective.  Due to Dulcolax beening effective for her to have bowel movements, Linzess would not be covered under her insurance plan.  Instructed patient  to take Dulcolax 3-5 times per week as having a bowel movement 1 time a week is not healthy and is most likely what is causing her sluggishness along with recuperating from surgery.  Patient verbalized understanding    3.  Urinary urgency.  Refill Ditropan.    4.  Status post left rotator cuff repair.  Patient underwent left rotator cuff repair March 2021.  She has struggled with the healing process.  She follows up with Ortho as scheduled.  Her physical therapy has been placed on hold due to swelling to her left rotator area.  She continues oxycodone for her pain management.    5.  Morbid obesity.    Requested patient make a 75-month face-to-face appointment in order to complete her health maintenance and her Medicare wellness  exam.  Patient verbalized understanding.      Follow up 6 months  Labs needed 1 week prior to appt: No    Dr. Gaspar Bidding, AGNP-C, [place]  [facility]       50% of the time was spent with the patient on counseling, answering questions, and/or coordination of care. Complex medical review of medical history, lab results, and testing. Complicated management plan formulated.      Tiffany [name], who was evaluated through a synchronous (real-time) audio-video encounter, and/or her healthcare decision maker, is aware that it is a billable service, with coverage as determined by her insurance carrier. She provided verbal consent to proceed: Yes, and patient identification was verified. It was conducted pursuant to the emergency declaration under the [place], [place] waiver authority and the [employer] and [employer] Act. A caregiver was present when appropriate. Ability to conduct physical exam was limited. I was in the office. The patient was at home.

## 2019-06-06 ENCOUNTER — Encounter: Payer: MEDICARE | Primary: Nurse Practitioner

## 2019-06-06 ENCOUNTER — Ambulatory Visit: Payer: MEDICARE | Attending: Physician Assistant | Primary: Nurse Practitioner

## 2019-06-06 NOTE — Progress Notes (Deleted)
Tiffany [name]  1970-04-06     HISTORY OF PRESENT ILLNESS  Tiffany [name] is a 49 y.o. female who presents today for evaluation s/p Left shoulder arthroscopic 1cm rotator cuff repair, subscapularis repair on 05/03/19. Patient has been going to PT. Describes pain as a 7/10. She notes that earlier this week she was rushing to get into her truck and used her left arm. She has been having an increase in pain since. Therapy was then difficult. Patient denies any fever, chills, chest pain, shortness of breath or calf pain. The remainder of the review of systems is negative. There are no new illness or injuries to report since last seen in the office. No changes in medications, allergies, social or family history.      PHYSICAL EXAM:   There were no vitals taken for this visit.   The patient is a well-developed, well-nourished female in no acute distress.  The patient is alert and oriented times three. The patient appears to be well groomed. Mood and affect are normal.  ORTHOPEDIC EXAM of left shoulder:  Inspection: swelling not present,  Bruising not present  Incisions well healed  Passive glenohumeral abduction 0-80 degrees  Stability: Stable  Strength: n/a  2+ distal pulses    IMPRESSION:  S/P Left shoulder arthroscopic 1cm rotator cuff repair, subscapularis repair    PLAN:   1. Patient with slight increase in pain the other day.  2. Will hold PT x 1 week. Pendulums and PROM only  3. rx for percocet given today. Patient given pain medication for short term acute pain relief. Goal is to treat patient according to above plan and to ultimately have patient off all pain medications once appropriate. If chronic pain management is required beyond what is expected for current orthopedic problem, will refer patient to pain management. PMP was reviewed and will be reviewed with every medication refill request.        RTC 1 week    Rogelia Boga, PA-C  [facility]

## 2019-06-08 ENCOUNTER — Encounter

## 2019-06-10 ENCOUNTER — Ambulatory Visit
Admit: 2019-06-10 | Discharge: 2019-06-10 | Payer: MEDICARE | Attending: Physician Assistant | Primary: Nurse Practitioner

## 2019-06-10 ENCOUNTER — Encounter

## 2019-06-10 ENCOUNTER — Encounter: Attending: Nurse Practitioner | Primary: Nurse Practitioner

## 2019-06-10 ENCOUNTER — Ambulatory Visit: Attending: Physician Assistant | Primary: Nurse Practitioner

## 2019-06-10 DIAGNOSIS — M75102 Unspecified rotator cuff tear or rupture of left shoulder, not specified as traumatic: Secondary | ICD-10-CM

## 2019-06-10 MED ORDER — OXYCODONE 5 MG TAB
5 mg | ORAL_TABLET | Freq: Four times a day (QID) | ORAL | 0 refills | Status: AC | PRN
Start: 2019-06-10 — End: 2019-06-20

## 2019-06-10 NOTE — Progress Notes (Signed)
Tiffany [name]  08-Sep-1970     HISTORY OF PRESENT ILLNESS  Tiffany [name] is a 49 y.o. female who presents today for evaluation s/p Left shoulder arthroscopic 1cm rotator cuff repair, subscapularis repair on 05/03/19.Describes pain as a 6/10. She notes that she was rushing to get into her truck and used her left arm. She has been having an increase in pain since. Has held therapy per my direction. Pain is no better. Motion is slightly better but pain persists. Patient denies any fever, chills, chest pain, shortness of breath or calf pain. The remainder of the review of systems is negative. There are no new illness or injuries to report since last seen in the office. No changes in medications, allergies, social or family history.      PHYSICAL EXAM:   There were no vitals taken for this visit.   The patient is a well-developed, well-nourished female in no acute distress.  The patient is alert and oriented times three. The patient appears to be well groomed. Mood and affect are normal.  ORTHOPEDIC EXAM of left shoulder:  Inspection: swelling not present,  Bruising not present  Incisions well healed  Passive glenohumeral abduction 0-80 degrees  Stability: Stable  Strength: n/a  2+ distal pulses    IMPRESSION:  S/P Left shoulder arthroscopic 1cm rotator cuff repair, subscapularis repair    PLAN:   1. Patient with persistent pain in left shoulder. Concerns for retear in subscapularis tendon  2. Will order an MRI arthrogram of the left shoulder to rule out retear of the subscapularis tendon we will see her back after MRI       RTC after MRI with Dr. Loistine Chance, PA-C  [facility] [facility]

## 2019-06-10 NOTE — Progress Notes (Signed)
Progress Notes by Rogelia Boga, PA at 06/10/19 1445                Author: Rogelia Boga, PA  Service: --  Author Type: Physician Assistant       Filed: 06/10/19 1503  Encounter Date: 06/10/2019  Status: Signed          Editor: Rogelia Boga, PA (Physician Assistant)                          [name]   04-Feb-1971       HISTORY OF PRESENT ILLNESS   BITANIA MATUSEK is a 49 y.o.  female who presents today for evaluation s/p Left shoulder arthroscopic 1cm rotator cuff repair, subscapularis repair on 05/03/19.Describes pain as a 6/10. She notes that she was rushing to get into  her truck and used her left arm. She has been having an increase in pain since. Has held therapy per my direction. Pain is no better. Motion is slightly better but pain persists. Patient denies any fever, chills, chest pain, shortness of breath or calf  pain. The remainder of the review of systems is negative. There are no new illness or injuries to report since last seen in the office. No changes in medications, allergies, social or family history.        PHYSICAL EXAM:    There were no vitals taken for this visit.    The patient is a well-developed, well-nourished female  in no acute distress.  The patient is alert and oriented times three. The patient appears to be well groomed. Mood and affect are normal.   ORTHOPEDIC EXAM of left shoulder:  Inspection: swelling not present ,  Bruising not present   Incisions well healed  Passive glenohumeral abduction 0-80 degrees  Stability: Stable  Strength: n/a   2+ distal pulses      IMPRESSION:  S/P Left shoulder arthroscopic 1cm rotator cuff repair, subscapularis repair      PLAN:    1. Patient with persistent pain in left shoulder. Concerns for retear in subscapularis tendon   2. Will order an MRI arthrogram of the left shoulder to rule out retear of the subscapularis tendon we will see her back after MRI          RTC after MRI with Dr. Loistine Chance, PA-C    [facility]

## 2019-06-11 ENCOUNTER — Encounter: Payer: MEDICARE | Primary: Nurse Practitioner

## 2019-06-11 NOTE — Telephone Encounter (Signed)
Received call from patient via answering service. Patient is s/p Left Shoulder Arthroscopic Rotator Cuff Repair/arthrex - Left completed on 05/03/2019 complaint of a fall. Patient states that she tripped and fell today and fell on her left shoulder. She states she still has pain medication for her pain. Patient reports she has an MRI of the left shoulder scheduled for 06/24/19.     Patient was advised to rest and ice her left shoulder and take the pain medication as prescribed as needed. I advised that patient that someone from Dr. Milton Ferguson team would call to check on her tomorrow as Dr. Domingo Pulse may want the MRI completed sooner than 06/24/19.         Roselie Skinner, PA-C, [facility]  06/11/2019 6:08 PM

## 2019-06-12 NOTE — Telephone Encounter (Signed)
Please call and tell patient to cont withMRI. See if Tiffany [name] can get her scheduled sooner

## 2019-06-14 ENCOUNTER — Encounter: Payer: MEDICARE | Primary: Nurse Practitioner

## 2019-06-18 ENCOUNTER — Encounter: Payer: MEDICARE | Primary: Nurse Practitioner

## 2019-06-20 NOTE — Telephone Encounter (Signed)
Patient called stating she is still having pain in her right shoulder. She stated her MRI got pushed back to 05/27, and she is concerned about the wait time because of how much pain she is in. She is requesting a call back from clinical with advice on what she can do.    Please advise patient at [phone].

## 2019-06-21 ENCOUNTER — Encounter: Payer: MEDICARE | Primary: Nurse Practitioner

## 2019-06-21 MED ORDER — MELOXICAM 15 MG TAB
15 mg | ORAL_TABLET | Freq: Every day | ORAL | 2 refills | Status: DC
Start: 2019-06-21 — End: 2019-12-02

## 2019-06-21 NOTE — Telephone Encounter (Signed)
Nsaids, ice and otc voltaren gel - mobic sent for pain

## 2019-06-21 NOTE — Telephone Encounter (Signed)
Spoke with patient.  Patient verbalized understanding.

## 2019-06-24 ENCOUNTER — Ambulatory Visit: Payer: MEDICARE | Primary: Nurse Practitioner

## 2019-07-04 ENCOUNTER — Inpatient Hospital Stay: Admit: 2019-07-04 | Payer: MEDICARE | Attending: Physician Assistant | Primary: Nurse Practitioner

## 2019-07-04 DIAGNOSIS — M75112 Incomplete rotator cuff tear or rupture of left shoulder, not specified as traumatic: Secondary | ICD-10-CM

## 2019-07-04 MED ORDER — IOHEXOL 240 MG/ML IV SOLN
240 mg iodine/mL | Freq: Once | INTRAVENOUS | Status: AC
Start: 2019-07-04 — End: 2019-07-04
  Administered 2019-07-04: 16:00:00 via INTRA_ARTICULAR

## 2019-07-04 MED ORDER — GADOTERIDOL 279.3 MG/ML INTRAVENOUS SOLUTION
279.3 mg/mL | Freq: Once | INTRAVENOUS | Status: AC
Start: 2019-07-04 — End: 2019-07-04
  Administered 2019-07-04: 16:00:00 via INTRA_ARTICULAR

## 2019-07-04 MED ORDER — SODIUM CHLORIDE 0.9 % INJECTION
Freq: Once | INTRAMUSCULAR | Status: AC
Start: 2019-07-04 — End: 2019-07-04
  Administered 2019-07-04: 16:00:00 via INTRA_ARTICULAR

## 2019-07-04 MED FILL — SODIUM CHLORIDE 0.9 % INJECTION: INTRAMUSCULAR | Qty: 10

## 2019-07-04 MED FILL — PROHANCE 279.3 MG/ML INTRAVENOUS SOLUTION: 279.3 mg/mL | INTRAVENOUS | Qty: 10

## 2019-07-04 MED FILL — OMNIPAQUE 240 MG IODINE/ML INTRAVENOUS SOLUTION: 240 mg iodine/mL | INTRAVENOUS | Qty: 50

## 2019-07-05 ENCOUNTER — Telehealth

## 2019-07-05 MED ORDER — TRAMADOL 50 MG TAB
50 mg | ORAL_TABLET | Freq: Four times a day (QID) | ORAL | 0 refills | Status: DC | PRN
Start: 2019-07-05 — End: 2019-09-27

## 2019-07-05 NOTE — Telephone Encounter (Signed)
Informed pt of message.

## 2019-07-05 NOTE — Telephone Encounter (Signed)
Patient called and is requesting Oxycodone medication from [facility].     Patient said she has been out of the medication for about five days. That the pain on the left shoulder is unbearable.     Patient said she had her mri done and will see Dr.Luciano on 07/09/19.      Therapist, music on [address]  Tel. [phone].    Patient tel. [phone]

## 2019-07-05 NOTE — Telephone Encounter (Signed)
She needs to take tylenol and ibuprofen for discomfort and move her arm    Tramadol sent to pharmacy

## 2019-07-05 NOTE — Telephone Encounter (Signed)
Patient called and is requesting Oxycodone medication from Dr.Luciano.     Patient said she has been out of the medication for about five days. That the pain on the left shoulder is unbearable.     Patient said she had her mri done and will see Dr.Luciano on 07/09/19.      M[employer] exhibitions officer on [address]  Tel. [phone].    Patient tel. [phone]

## 2019-07-09 ENCOUNTER — Ambulatory Visit
Admit: 2019-07-09 | Discharge: 2019-07-09 | Payer: MEDICARE | Attending: Orthopaedic Surgery | Primary: Nurse Practitioner

## 2019-07-09 ENCOUNTER — Ambulatory Visit: Attending: Orthopaedic Surgery | Primary: Nurse Practitioner

## 2019-07-09 DIAGNOSIS — M75102 Unspecified rotator cuff tear or rupture of left shoulder, not specified as traumatic: Secondary | ICD-10-CM

## 2019-07-09 NOTE — Progress Notes (Signed)
[name]  1970/02/17     HISTORY OF PRESENT ILLNESS  Tiffany [name] is a 49 y.o. female who presents today for evaluation s/p Left shoulder arthroscopic 1cm rotator cuff repair, subscapularis repair on 05/03/19. She is here today for an MRI review. Describes pain as a 7/10. She notes that she was rushing to get into her truck and used her left arm. She has been having an increase in pain since. Has held therapy per my direction. Pain is no better. Motion is slightly better but pain persists. Patient denies any fever, chills, chest pain, shortness of breath or calf pain. The remainder of the review of systems is negative. There are no new illness or injuries to report since last seen in the office. No changes in medications, allergies, social or family history.      Pain Assessment  07/09/2019   Location of Pain Shoulder   Location Modifiers Left   Severity of Pain 7   Quality of Pain Aching;Sharp;Popping   Quality of Pain Comment -   Duration of Pain Persistent   Frequency of Pain Constant   Aggravating Factors Bending;Stretching;Straightening   Aggravating Factors Comment -   Limiting Behavior Yes   Relieving Factors Nothing   Result of Injury Yes   Work-Related Injury No   Type of Injury Fall       PHYSICAL EXAM:   Visit Vitals  Pulse 80   Temp 97.5 ??F (36.4 ??C) (Temporal)   Ht 5' 3.5" (1.613 m)   Wt 302 lb (137 kg)   SpO2 97%   BMI 52.66 kg/m??      The patient is a well-developed, well-nourished female in no acute distress.  The patient is alert and oriented times three. The patient appears to be well groomed. Mood and affect are normal.  ORTHOPEDIC EXAM of left shoulder:  Inspection: swelling not present,  Bruising not present  Incisions well healed  Passive glenohumeral abduction 0-80 degrees  Stability: Stable  Strength: n/a  2+ distal pulses      IMAGING: MRI arthrogram of left shoulder dated 07/04/2019 was reviewed and read by Dr. Domingo Pulse:   IMPRESSION:  Expected post surgical changes compatible  with anterior rotator cuff tendon repair. No findings of re-tear.   Mild/moderate generalized rotator cuff tendinosis with areas of low-grade  partial thickness interstitial tearing and undersurface fraying most notably of the supraspinatus tendon.  Musculature: No significant muscle edema. Mild global rotator cuff muscle atrophy      IMPRESSION:  S/P Left shoulder arthroscopic 1cm rotator cuff repair, subscapularis repair  Encounter Diagnosis   Name Primary?   ??? Tear of left rotator cuff, unspecified tear extent, unspecified whether traumatic Yes         PLAN:   1. Patient with persistent pain in left shoulder post-operatively. The MRI arthrogram does not reveal any evidence of a rotator cuff re-tear.  2. Will resume with PT for the left shoulder per protocol. Will see her back in 4 weeks.       RTC 4 weeks    Scribed by Criss Alvine Brittingham Lillie Columbia) as dictated by Richardo Hanks, MD    I, Dr. Richardo Hanks, confirm that all documentation is accurate.    Richardo Hanks, M.D.   [facility]

## 2019-07-09 NOTE — Progress Notes (Signed)
Progress Notes by Richardo Hanks, MD at 07/09/19 1110                Author: Richardo Hanks, MD  Service: --  Author Type: Physician       Filed: 07/09/19 1411  Encounter Date: 07/09/2019  Status: Signed          Editor: Richardo Hanks, MD (Physician)                          Tiffany [name]   1970-11-05       HISTORY OF PRESENT ILLNESS   Tiffany [name] is a 49 y.o.  female who presents today for evaluation s/p Left shoulder arthroscopic 1cm rotator cuff repair, subscapularis repair on 05/03/19. She is here today for an MRI review. Describes pain as a 7/10. She notes  that she was rushing to get into her truck and used her left arm. She has been having an increase in pain since. Has held therapy per my direction. Pain is no better. Motion is slightly better but pain persists. Patient denies any fever, chills, chest  pain, shortness of breath or calf pain. The remainder of the review of systems is negative. There are no new illness or injuries to report since last seen in the office. No changes in medications, allergies, social or family history.           Pain Assessment   07/09/2019        Location of Pain  Shoulder     Location Modifiers  Left     Severity of Pain  7     Quality of Pain  Aching;Sharp;Popping     Quality of Pain Comment  -     Duration of Pain  Persistent     Frequency of Pain  Constant     Aggravating Factors  Bending;Stretching;Straightening     Aggravating Factors Comment  -     Limiting Behavior  Yes     Relieving Factors  Nothing     Result of Injury  Yes     Work-Related Injury  No        Type of Injury  Fall           PHYSICAL EXAM:    Visit Vitals      Pulse  80     Temp  97.5 ??F (36.4 ??C) (Temporal)     Ht  5' 3.5" (1.613 m)     Wt  302 lb (137 kg)     SpO2  97%        BMI  52.66 kg/m??         The patient is a well-developed, well-nourished female  in no acute distress.  The patient is alert and oriented times three. The patient appears to be well groomed. Mood  and affect are normal.   ORTHOPEDIC EXAM of left shoulder:  Inspection: swelling not present ,  Bruising not present   Incisions well healed  Passive glenohumeral abduction 0-80 degrees  Stability: Stable  Strength: n/a   2+ distal pulses         IMAGING: MRI arthrogram of left shoulder dated 07/04/2019 was reviewed and read by Dr. Domingo Pulse:    IMPRESSION:   Expected post surgical changes compatible with anterior rotator cuff tendon repair. No findings of re-tear.    Mild/moderate generalized rotator cuff tendinosis with areas of low-grade   partial thickness interstitial tearing and undersurface fraying  most notably of the supraspinatus tendon.   Musculature: No significant muscle edema. Mild global rotator cuff muscle atrophy         IMPRESSION:  S/P Left shoulder arthroscopic 1cm rotator cuff repair, subscapularis repair     Encounter Diagnosis        Name  Primary?         ?  Tear of left rotator cuff, unspecified tear extent, unspecified whether traumatic  Yes              PLAN:    1. Patient with persistent pain in left shoulder post-operatively. The MRI arthrogram does not reveal any evidence of a rotator cuff re-tear.   2. Will resume with PT for the left shoulder per protocol. Will see her back in 4 weeks.          RTC 4 weeks      Scribed by Criss Alvine Brittingham Lillie Columbia) as dictated by Richardo Hanks, MD      I, Dr. Richardo Hanks, confirm that all documentation is accurate.      Richardo Hanks, M.D.    [facility]

## 2019-07-16 ENCOUNTER — Encounter

## 2019-07-16 MED ORDER — CELECOXIB 200 MG CAP
200 mg | ORAL_CAPSULE | ORAL | 1 refills | Status: DC
Start: 2019-07-16 — End: 2019-12-02

## 2019-08-06 ENCOUNTER — Encounter

## 2019-08-06 ENCOUNTER — Ambulatory Visit
Admit: 2019-08-06 | Discharge: 2019-08-06 | Payer: MEDICARE | Attending: Orthopaedic Surgery | Primary: Nurse Practitioner

## 2019-08-06 ENCOUNTER — Ambulatory Visit: Attending: Orthopaedic Surgery | Primary: Nurse Practitioner

## 2019-08-06 DIAGNOSIS — M75102 Unspecified rotator cuff tear or rupture of left shoulder, not specified as traumatic: Secondary | ICD-10-CM

## 2019-08-06 MED ORDER — BACLOFEN 10 MG TAB
10 mg | ORAL_TABLET | Freq: Three times a day (TID) | ORAL | 1 refills | Status: DC | PRN
Start: 2019-08-06 — End: 2019-10-21

## 2019-08-06 NOTE — Progress Notes (Signed)
Progress Notes by Richardo Hanks, MD at 08/06/19 1110                Author: Richardo Hanks, MD  Service: --  Author Type: Physician       Filed: 08/06/19 1305  Encounter Date: 08/06/2019  Status: Signed          Editor: Richardo Hanks, MD (Physician)                          Tiffany [name]   1970/06/18       HISTORY OF PRESENT ILLNESS   Tiffany [name] is a 49 y.o.  female who presents today for evaluation s/p Left shoulder arthroscopic 1cm rotator cuff repair, subscapularis repair on 05/03/19. Describes pain as a 6/10. She notes that she was rushing to get into  her truck and used her left arm. She experienced an increase in pain after this. Did not go to PT after last OV because of the co-pay. Still notes some issues with lifting the arm up. Has been taking NSAID. Having some pain that moves up to the neck.  [facility] Does note overall improvement in her symptoms over the last 6 weeks. Patient denies any fever, chills, chest pain, shortness of breath or calf pain. The remainder of the review of systems is negative. There  are no new illness or injuries to report since last seen in the office. No changes in medications, allergies, social or family history.           Pain Assessment   08/06/2019        Location of Pain  Shoulder     Location Modifiers  Left     Severity of Pain  6     Quality of Pain  Sharp     Quality of Pain Comment  -     Duration of Pain  Persistent     Frequency of Pain  Constant     Aggravating Factors  Other (Comment)     Aggravating Factors Comment  laying on left side, lifting     Limiting Behavior  Yes     Relieving Factors  Other (Comment);Exercises     Relieving Factors Comment  therapy pool     Result of Injury  No     Work-Related Injury  -        Type of Injury  -           PHYSICAL EXAM:    Visit Vitals      Pulse  93     Temp  97.8 ??F (36.6 ??C) (Temporal)     Resp  16     Ht  5\' 3"  (1.6 m)     Wt  298 lb (135.2 kg)      SpO2  98%        BMI  52.79 kg/m??         The patient is a well-developed, well-nourished female  in no acute distress.  The patient is alert and oriented times three. The patient appears to be well groomed. Mood and affect are normal.   LYMPHATIC: lymph nodes are not enlarged and are within normal limits   SKIN: normal in color and non tender to palpation. There are no bruises or abrasions noted.    NEUROLOGICAL: Motor sensory exam is within normal limits. Reflexes are equal bilaterally. There is normal sensation  to pinprick and light touch   ORTHOPEDIC EXAM of left shoulder:  Inspection: swelling  not present,  Bruising not present   Incisions well healed  Passive glenohumeral abduction 0-80 degrees  Stability: Stable  Strength: n/a   2+ distal pulses         IMAGING: MRI arthrogram of left shoulder dated 07/04/2019 was reviewed and read by Dr. Domingo Pulse:    IMPRESSION:   Expected post surgical changes compatible with anterior rotator cuff tendon repair. No findings of re-tear.    Mild/moderate generalized rotator cuff tendinosis with areas of low-grade   partial thickness interstitial tearing and undersurface fraying most notably of the supraspinatus tendon.   Musculature: No significant muscle edema. Mild global rotator cuff muscle atrophy            IMPRESSION:  S/P Left shoulder arthroscopic 1cm rotator cuff repair, subscapularis repair     Encounter Diagnosis        Name  Primary?         ?  Tear of left rotator cuff, unspecified tear extent, unspecified whether traumatic  Yes              PLAN:    1. Pt presents s/p Left shoulder arthroscopic 1cm rotator cuff repair, subscapularis repair on 05/03/19. Does note overall improvement in her symptoms but is c/o neck pain today. Was given prescription for Baclofen and will see her back in 6 weeks. Continue  with home exercises.    Risk factors include: BMI>50   2. No ultrasound exam indicated today   3. No cortisone injection indicated today    4. No  Physical/Occupational Therapy indicated today   5. No diagnostic test indicated today:    6. No durable medical equipment indicated today   7. No referral indicated today    8. Yes medications indicated today: BACLOFEN   9. No Narcotic indicated today          RTC 6 weeks             Scribed by Autumn Brittingham Lillie Columbia) as dictated by Richardo Hanks, MD      I, Dr. Richardo Hanks, confirm that all documentation is accurate.      Richardo Hanks, M.D.    [facility]

## 2019-08-07 NOTE — Telephone Encounter (Signed)
appointment

## 2019-09-17 ENCOUNTER — Ambulatory Visit
Admit: 2019-09-17 | Discharge: 2019-09-17 | Payer: MEDICARE | Attending: Physical Medicine & Rehabilitation | Primary: Nurse Practitioner

## 2019-09-17 ENCOUNTER — Ambulatory Visit: Attending: Physical Medicine & Rehabilitation | Primary: Nurse Practitioner

## 2019-09-17 DIAGNOSIS — M961 Postlaminectomy syndrome, not elsewhere classified: Secondary | ICD-10-CM

## 2019-09-17 MED ORDER — DULOXETINE 30 MG CAP, DELAYED RELEASE
30 mg | ORAL_CAPSULE | Freq: Every evening | ORAL | 0 refills | Status: DC
Start: 2019-09-17 — End: 2019-10-17

## 2019-09-17 MED ORDER — DULOXETINE 60 MG CAP, DELAYED RELEASE
60 mg | ORAL_CAPSULE | Freq: Every evening | ORAL | 1 refills | Status: DC
Start: 2019-09-17 — End: 2020-01-08

## 2019-09-17 NOTE — Progress Notes (Signed)
Progress Notes by Lilian Coma, MD at 09/17/19 [id number]                Author: Lilian Coma, MD  Service: --  Author Type: Physician       Filed: 09/17/19 1015  Encounter Date: 09/17/2019  Status: Signed          Editor: Lilian Coma, MD (Physician)                       [facility]  [address] [place] [address]  Phone: [phone]  Fax: [phone]            PROGRESS NOTE         HISTORY OF PRESENT ILLNESS:   The patient is a 49 y.o.  female and was seen today for follow up of progressive lower back pain radiating into the BLE (LLE>RLE) in a S1 distribution to the feet involving the digits x 07/2018 without specific trauma. Pt has a  hx of L5-S1 fusion??performed in 03/2015 by Dr. Nena Polio Nundkumar??in [place], [place].??Symptoms prior to her surgery consisted of low  back pain extending posteriorly into her LLE with paresthesias to the toes. Patient did not experienced relief after the surgery. She reports her symptoms increased after her srurgery and now she c/o low back pain extending into her BLE (L>R). Her pain  is not positional. Pt previously received lumbar injections by Dr. Hassell Done without benefit (pt reports??3 in the past 12 months). Pt completed PT in 2020 without benefit.??She??is compliant with her??HEP. She reports bladder incontinence since her  surgery x 1 years. She states??she experienced some mild incontinence before her surgery, however it has worsened.??Pt is followed by [facility]  and is being treated with Tylenol No. 4. The patient is??RHD. Note from??Dr. Rudi Heap??dated 02/19/16??indicating patient was seen with c/o back pain since 03/2015. Patient underwent L5-S1 laminectomy without relief. It was noted, her??artery  and??vein were??severed??and surgery had to be prolonged and was hospitalized for 40 days due to surgical complication.??At that time she c/o paraesthesias on her BLE and UE as a result of her surgery.??Her pain is  aggravated with any elongated  position. At that time, she was treated with Neurontin, which she dc due to weigh gain. Now she takes Topamax 50 mg BID for 6 months with minimum  relief. Regarding Neurontin, patient reports tolerated well and her weight??gained has not been stable since her dc Neurontin??300 mg BID.  Required multiple follow surgeries including exploratory 03/27/15 to control bleeding the the left common iliac and femoral bypass. Multiple follow up surgeries for abdominal washouts. She subsequently developed a LLE DVT, and an IVC filter was placed.  Subsequently, it had been removed. Per patient, she returned to the OR to complete her lumbar surgical fusion. That operative note was not available for our review.??Patient has a hx of injections before the surgery. Patient reports a hx of therapy  before and after her surgery. She continues with her HEP daily.??Patient??admits??changes??in bowel habits x 1 months.??She denies possibility of pregnancy??or breastfeeding.??Patient denies fever, weight loss, or skin changes. Note from??Dr.  Belia Heman??dated 10/10/2017??indicating patient underwent Camera operator trial.??Note from??Dr. Rosilyn Mings 11/17/2017??indicating patient was seen for f/u low back pain radiating into the RLE. Pain was 6-10/10. Successful  SCS trial. Indicated they were considering doing SCS implantation at the surgical center. Pt's BMI was 56 and they could not accommodate her. Indicated he was not comfortable performing surgery on  her  due to her obesity. She indicated that she was pursuing surgical weight loss. Due to her previous abdominal surgery and scar tissue they would not be able to do it laparoscopically and pt was not interested. Per pt, Dr. Quita Skye, weight loss specialist  at [facility] gave her 4 other specialists she could get a second opinion from and the closest was at [place], [place].??ER note from??Dr. Mora Appl 12/08/2018??indicating patient presented for acute right knee pain.??Note  from??[facility],  Donald Prose, DNP??dated 01/09/2019??indicating patient was seen with c/o chronic back pain. Indicated she is in pain management, with Dr. Jorje Guild.  Pt was previously followed by Dr. Hassell Done. SCS??trial has been beneficial for her pain. Indicated her insurance denied the stimulator she had  the trial with and would only pay for "generic brand". Indicated she cannot see Dr. Hassell Done because she owes them money. Reported she needs assistance getting into and out of bathtub. Pt has vascular issues in BLE followed by Dr. Dorthula Perfect.  Note from Dr.  Domingo Pulse dated 03/19/2019 indicating patient was seen with c/o left shoulder pain. Rated her pain 8/10. Limited ROM. Suspected she had rotator  cuff tear. Ordered MRI.  Lumbar spine MRI dated??09/09/15 report??reviewed.??Films not available. 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 Good alignment at the fusion at L5-S1.??BLE EMG??dated 02/19/16  reviewed, Is within normal limits. No evidence of lumbosacral radiculopathy.??Myelogram Lumbar??dated 06/16/2017??films not available  for my??independent review. Per report, Successful lumbar myelogram. Minimal central canal encroachment at L3/L4.??L spine CT??dated  06/16/2017??films not??available for my??independent review. Per report, Numbering for this exam is based on counting down from T1 vertebra on CT chest abdomen pelvis from 2011. 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 Below this are 4 lumbar type vertebral bodies with the sacralized L5. This numbering places the posterior fusion hardware at L4-L5 rather than L5-S1. There is a rudimentary but visible L5-S1 disc space. Posterior fusion  hardware and interbody fusion cage at L4-L5 disc level is intact. Loss of disc and foraminal height at this level results in what appears to be moderate to marked left and moderate right foraminal stenosis. No focal disc pathology  or central canal stenosis.  Mild central canal stenosis and mild foraminal narrowing bilaterally at L3/L4 with moderate facet DJD.??At??her??last clinical appointment, pt had pending left rotator cuff repair. She should have discontinued the Cymbalta 30 mg daily. I provided  her refills of Cymbalta 60 mg daily. I had the patient sign a release of medical information to obtain records from Dr. Hassell Done.    ??      The patient returns today and reports pain location and distribution remains unchanged. She rates  her pain 7/10, previously 5-8/10. Pt is tolerating Cymbalta 60 mg daily. Pt  reports she ran out of the medication. Pt is no longer followed by [facility] secondary to taking medications  through Dr. Domingo Pulse. Pt followed up with Dr. Domingo Pulse on 04/03/2019 and per pt, she had left rotator cuff tear on 05/03/2019. She is scheduled  to follow up with Dr. Domingo Pulse on 09/18/2019 to discuss a total shoulder repair. She reports she has been attending aquatic therapy. She is compliant  with her HEP and reports recent weight loss. She continues to endorse bladder incontinence and denies being followed for her incontinence. She  denies bowel changes. Note from Dr Hassell Done dated 04/08/2019 indicating patient was seen  with a pain of 6/10. Indicated pt was unable to be accommodated  for a SCS secondary to her BMI of 56.          PMP reviewed and indicated she is treating with Tramadol from [place].  Body mass index is 51.96 kg/m??.      PCP: Alla Feeling, DNP           Past Medical History:        Diagnosis  Date         ?  Arthritis       ?  Chronic constipation  07/25/2018          Dr Donovan Kail         ?  Chronic low back pain with sciatica  07/25/2018          Dr Berna Spare mgt         ?  Chronic pain of right knee  07/25/2018           Dr Vinson Moselle, ortho         ?  Constipation due to opioid therapy  07/25/2018     ?  DVT (deep venous thrombosis) ([place])  2017          Left groin; Dr Sallee Provencal, vascular, places IVF preop          ?  Dysphagia            Dr Luster Landsberg         ?  Esophageal obstruction            Dr Donovan Kail [facility]         ?  History of fall  09/05/2018          see brief documentation on 09/05/2018         ?  Insomnia  07/25/2018     ?  Morbid obesity ([place])  03/11/2016     ?  Spasm of back muscles  07/25/2018     ?  Status post right knee replacement  07/25/2018           Dr Vinson Moselle, ortho         ?  Urinary urgency  06/05/2019             Social History          Socioeconomic History         ?  Marital status:  MARRIED              Spouse name:  Not on file         ?  Number of children:  Not on file     ?  Years of education:  Not on file     ?  Highest education level:  Not on file       Occupational History        ?  Not on file       Tobacco Use         ?  Smoking status:  Never Smoker     ?  Smokeless tobacco:  Never Used       Vaping Use         ?  Vaping Use:  Never used       Substance and Sexual Activity         ?  Alcohol use:  No     ?  Drug use:  No     ?  Sexual activity:  Yes              Partners:  Male        Other Topics  Concern        ?  Not on file       Social History Narrative        ?  Not on file          Social Determinants of Health          Financial Resource Strain:         ?  Difficulty of Paying Living Expenses:        Food Insecurity:         ?  Worried About Charity fundraiser in the Last Year:      ?  Arboriculturist in the Last Year:        Transportation Needs:         ?  Film/video editor (Medical):      ?  Lack of Transportation (Non-Medical):        Physical Activity:         ?  Days of Exercise per Week:      ?  Minutes of Exercise per Session:        Stress:         ?  Feeling of Stress :        Social Connections:         ?  Frequency of Communication with Friends and Family:      ?  Frequency of Social Gatherings with Friends and Family:      ?  Attends Religious Services:      ?  Active Member of Clubs or Organizations:      ?  Attends Archivist Meetings:      ?   Marital Status:        Intimate Partner Violence:         ?  Fear of Current or Ex-Partner:      ?  Emotionally Abused:      ?  Physically Abused:         ?  Sexually Abused:              Current Outpatient Medications          Medication  Sig  Dispense  Refill           ?  baclofen (LIORESAL) 10 mg tablet  Take 1 Tablet by mouth three (3) times daily as needed for Muscle Spasm(s) or Pain.  60 Tablet  1     ?  celecoxib (CELEBREX) 200 mg capsule  TAKE ONE CAPSULE BY MOUTH TWICE A DAY WITH A MEAL  60 Capsule  1     ?  meloxicam (Mobic) 15 mg tablet  Take 1 Tab by mouth daily (with breakfast).  30 Tab  2     ?  traZODone (DESYREL) 100 mg tablet  Take 1 Tab by mouth nightly.  90 Tab  1     ?  oxybutynin (DITROPAN) 5 mg tablet  Take 1 Tab by mouth two (2) times a day. Indications: a condition where the urge to urinate results in urine leakage  180 Tab  1     ?  DULoxetine (CYMBALTA) 60 mg capsule  Take 1 Cap by mouth daily.  30 Cap  1     ?  cyclobenzaprine (FLEXERIL) 10 mg  tablet  Take 1 Tab by mouth two (2) times daily as needed for Muscle Spasm(s). Indications: muscle spasm  60 Tab  2     ?  topiramate (Topamax) 25 mg tablet  Take  by mouth two (2) times a day.               ?  bisacodyL (Dulcolax, bisacodyl,) 5 mg EC tablet  Take 5 mg by mouth daily as needed for Constipation.                 Allergies        Allergen  Reactions         ?  Propoxyphene N-Acetaminophen  Hives             Other reaction(s): Unknown         ?  Propoxyphene  Other (comments)             Reaction unknown         ?  Norco [Hydrocodone-Acetaminophen]  Itching               PHYSICAL EXAMINATION      Visit Vitals      BP  122/65 (BP 1 Location: Left arm)     Pulse  82     Temp  97.5 ??F (36.4 ??C) (Temporal)     Resp  18     Ht  5' 3.5" (1.613 m)     SpO2  100%        BMI  51.96 kg/m??          CONSTITUTIONAL: NAD, A&O x 3  SENSATION : Intact to light touch throughout   RANGE OF MOTION: The patient has full passive range of motion in all four  extremities.   MOTOR:  Straight Leg Raise: Negative, bilateral                                                   Hip Flex  Knee Ext  Knee Flex  Ankle DF  GTE  Ankle PF  Tone              Right  +4/5  +4/5  +4/5  +4/5  +4/5  +4/5  +4/5              Left  +4/5  +4/5  +4/5  +4/5  +4/5  +4/5  +4/5           ASSESSMENT    Diagnoses and all orders for this visit:      1. Lumbar post-laminectomy syndrome   -     DULoxetine (CYMBALTA) 30 mg capsule; Take 1 Capsule by mouth nightly.   -     DULoxetine (CYMBALTA) 60 mg capsule; Take 1 Capsule by mouth nightly.      2. Lumbar neuritis   -     DULoxetine (CYMBALTA) 30 mg capsule; Take 1 Capsule by mouth nightly.   -     DULoxetine (CYMBALTA) 60 mg capsule; Take 1 Capsule by mouth nightly.      3. Left shoulder pain, unspecified chronicity   -     DULoxetine (CYMBALTA) 30 mg capsule; Take 1 Capsule by mouth nightly.   -     DULoxetine (CYMBALTA) 60 mg capsule; Take 1 Capsule by mouth nightly.  4. Morbid obesity ([place])            IMPRESSION AND PLAN:   Patient returns to the office today with c/o progressive lower back pain radiating into the BLE (LLE>RLE) in a S1 distribution to the feet involving the digits . Multiple treatment options were discussed.  Pt is scheduled to f/u with Dr. Domingo Pulse tomorrow for her left shoulder. Pt reported a recent weight loss (47 Ibs)  but I recommended she should follow up with Dale Durham, D.O., bariatrics. I will restart her on Cymbalta 30 mg and 60 mg daily. Patient  is neurologically intact. I will see the patient back in 2 month's time or earlier if needed.         Written by Jonna Coup, [employer]ribe[employer]ick, as dictated by Candi Leash, MD   I examined the patient, reviewed and agree with the note.

## 2019-09-19 ENCOUNTER — Ambulatory Visit: Payer: MEDICARE | Attending: Orthopaedic Surgery | Primary: Nurse Practitioner

## 2019-09-26 ENCOUNTER — Encounter

## 2019-09-26 ENCOUNTER — Ambulatory Visit
Admit: 2019-09-26 | Discharge: 2019-09-26 | Payer: MEDICARE | Attending: Orthopaedic Surgery | Primary: Nurse Practitioner

## 2019-09-26 ENCOUNTER — Ambulatory Visit: Attending: Orthopaedic Surgery | Primary: Nurse Practitioner

## 2019-09-26 DIAGNOSIS — M67922 Unspecified disorder of synovium and tendon, left upper arm: Secondary | ICD-10-CM

## 2019-09-26 NOTE — Telephone Encounter (Signed)
Requested Prescriptions     Refused Prescriptions Disp Refills   ??? topiramate (TOPAMAX) 50 mg tablet [Pharmacy Med Name: TOPIRAMATE 50 MG TABLET] 60 Tablet 0     Sig: TAKE ONE TABLET BY MOUTH TWICE A DAY FOR CHRONIC PAIN     Refused By: Alla Feeling     Reason for Refusal: Not the prescriber of this medication     Dr. Jorje Guild, pain management, prescribes Topamax.

## 2019-09-26 NOTE — Progress Notes (Signed)
Progress Notes by Richardo Hanks, MD at 09/26/19 1120                Author: Richardo Hanks, MD  Service: --  Author Type: Physician       Filed: 10/03/19 0853  Encounter Date: 09/26/2019  Status: Signed          Editor: Richardo Hanks, MD (Physician)                          Tiffany [name]   09/12/1970       HISTORY OF PRESENT ILLNESS   Tiffany [name] is a 49 y.o.  female who presents today for evaluation s/p Left shoulder arthroscopic 1cm rotator cuff repair, subscapularis repair on 05/03/19. Describes pain as a 8/10. Has started to notice she is nursing her arm  more. ROM has improved but pain remains. Inquiring about a shoulder replacement. She notes that she was rushing to get into her truck and used her left arm. She experienced an increase in pain after this. Still notes some issues with lifting the arm up.  Has been taking NSAID and baclofen. [facility] Does note overall improvement in her symptoms over the last 6 weeks. Has lost about 50 pounds as of recent. Patient denies any fever, chills, chest pain, shortness of breath  or calf pain. The remainder of the review of systems is negative. There are no new illness or injuries to report since last seen in the office. No changes in medications, allergies, social or family history.           Pain Assessment   09/26/2019        Location of Pain  Shoulder     Location Modifiers  Left     Severity of Pain  8     Quality of Pain  Throbbing;Sharp;Aching     Quality of Pain Comment  -     Duration of Pain  -     Frequency of Pain  Constant     Aggravating Factors  Bending;Stretching;Straightening     Aggravating Factors Comment  -     Limiting Behavior  Yes     Relieving Factors  -     Relieving Factors Comment  -     Result of Injury  -     Work-Related Injury  -     Type of Injury  -        Type of Injury Comment  -           PHYSICAL EXAM:    Visit Vitals      Pulse  98     Temp  (!) 96.6 ??F (35.9  ??C) (Temporal)     Ht  5' 3.5" (1.613 m)     Wt  291 lb (132 kg)     SpO2  99%        BMI  50.74 kg/m??         The patient is a well-developed, well-nourished female  in no acute distress.  The patient is alert and oriented times three. The patient appears to be well groomed. Mood and affect are normal.   LYMPHATIC: lymph nodes are not enlarged and are within normal limits   SKIN: normal in color and non tender to palpation. There are no bruises or abrasions noted.    NEUROLOGICAL: Motor sensory exam is within normal limits. Reflexes  are equal bilaterally. There is normal sensation to pinprick and light touch   ORTHOPEDIC EXAM of left shoulder:  Inspection: swelling  not present,  Bruising not present   Incisions well healed  Passive glenohumeral abduction 0-80 degrees  Stability: Stable  Strength: n/a   2+ distal pulses         IMAGING: MRI arthrogram of left shoulder dated 07/04/2019 was reviewed and read by Dr. Domingo Pulse:    IMPRESSION:   Expected post surgical changes compatible with anterior rotator cuff tendon repair. No findings of re-tear.    Mild/moderate generalized rotator cuff tendinosis with areas of low-grade   partial thickness interstitial tearing and undersurface fraying most notably of the supraspinatus tendon.   Musculature: No significant muscle edema. Mild global rotator cuff muscle atrophy            IMPRESSION:  S/P Left shoulder arthroscopic 1cm rotator cuff repair, subscapularis repair     Encounter Diagnoses        Name  Primary?         ?  Tendinopathy of left biceps tendon  Yes         ?  Tear of left rotator cuff, unspecified tear extent, unspecified whether traumatic                PLAN:    1. Pt presents s/p Left shoulder arthroscopic 1cm rotator cuff repair, subscapularis repair on 05/03/19. I would like her to continue with her HEP and start taking celebrex to help with the pain. I do not believe all options have been exhausted and it  is time for a shoulder replacement. I advised the  patient on the importance of dramatic weight loss. Return in 6 weeks. RIsk factors include: BMI>50   2. No ultrasound exam indicated today   3. No cortisone injection indicated today    4. No Physical/Occupational Therapy indicated today   5. No diagnostic test indicated today:    6. No durable medical equipment indicated today   7. No referral indicated today    8. Yes medications indicated today:    9. No Narcotic indicated today          RTC 6 weeks          Scribed by Oletta Darter [facility]) as dictated by Richardo Hanks, MD      I, Dr. Richardo Hanks, confirm that all documentation is accurate.      Richardo Hanks, M.D.    [facility]

## 2019-09-26 NOTE — Telephone Encounter (Signed)
Requested Prescriptions     Refused Prescriptions Disp Refills   ??? topiramate (Topamax) 25 mg tablet       Sig: Take  by mouth two (2) times a day.     Refused By: Alla Feeling     Reason for Refusal: Not the prescriber of this medication   ??? traZODone (DESYREL) 100 mg tablet 90 Tablet 1     Sig: Take 1 Tablet by mouth nightly.     Refused By: Alla Feeling     Reason for Refusal: Patient has requested refill too soon     Pain management prescribes Topamax.  Patient was given 37-month supply of trazodone 06/05/2019 to hold her until her follow-up appointment with me on 11/27/2019.

## 2019-09-27 ENCOUNTER — Encounter

## 2019-09-27 MED ORDER — TRAMADOL 50 MG TAB
50 mg | ORAL_TABLET | ORAL | 0 refills | Status: AC
Start: 2019-09-27 — End: 2019-10-04

## 2019-10-03 MED ORDER — CELECOXIB 200 MG CAP
200 mg | ORAL_CAPSULE | Freq: Two times a day (BID) | ORAL | 2 refills | Status: DC
Start: 2019-10-03 — End: 2020-02-17

## 2019-10-08 NOTE — Telephone Encounter (Signed)
Sounds like she might be dealing with a virus. I recommend Mucinex D.  She needs to take this twice a day with an 8 ounce glass of water to help with congestion.  I would also recommend Delsym twice a day to help with her cough.  She may take Tylenol for her headaches.  She needs to stay well-hydrated and rest.  If not better in a week, she needs to either come in to see me for sick appointment or be evaluated at an urgent care.  Thank you

## 2019-10-08 NOTE — Telephone Encounter (Signed)
Pt given this information

## 2019-10-08 NOTE — Telephone Encounter (Signed)
Call transferred from the pac. Patient asking to speak to nurse. Cough for 2 days, headache, mucus, she isn't sure if there is color.  No fever.     Wants to know what she can take.

## 2019-10-12 ENCOUNTER — Encounter

## 2019-10-17 ENCOUNTER — Telehealth
Admit: 2019-10-17 | Discharge: 2019-10-17 | Payer: MEDICARE | Attending: Nurse Practitioner | Primary: Nurse Practitioner

## 2019-10-17 ENCOUNTER — Telehealth: Attending: Nurse Practitioner | Primary: Nurse Practitioner

## 2019-10-17 DIAGNOSIS — J069 Acute upper respiratory infection, unspecified: Secondary | ICD-10-CM

## 2019-10-17 MED ORDER — HYDROCODONE 10 MG-CHLORPHENIRAMINE 8 MG/5 ML ORAL SUSP EXTEND.REL 12HR
10-8 mg/5 mL | Freq: Two times a day (BID) | ORAL | 0 refills | Status: AC | PRN
Start: 2019-10-17 — End: 2019-10-24

## 2019-10-17 MED ORDER — AZITHROMYCIN 250 MG TAB
250 mg | ORAL_TABLET | ORAL | 0 refills | Status: AC
Start: 2019-10-17 — End: 2019-10-22

## 2019-10-17 NOTE — Progress Notes (Signed)
[facility]  [facility] [place], [place]  [phone] fax      10/17/2019     Unable to establish Internet connection to complete virtual visit.      HPI:   Tiffany [name] 1970-11-11 is a pleasant BLACK/[place] female who presents today by phone for a sick appt.     Developed a cold with a mild cough last week.  She did have a slight temperature last week as well.  She has been taking Mucinex and other over-the-counter regimens with nothing being effective.  She did start NyQuil last night.  She is having trouble sleeping at night due to her cough. Her cough is productive with green phlegm.  She has taken Tussionex in the past and has tolerated it well despite her allergy to hydrocodone. She denies shortness of breath or chest pain.     Past Medical History:   Diagnosis Date   ??? Arthritis    ??? Chronic constipation 07/25/2018    Dr Donovan Kail   ??? 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 Chronic pain of right knee 07/25/2018     Dr Vinson Moselle, ortho   ??? Constipation due to opioid therapy 07/25/2018   ??? DVT (deep venous thrombosis) ([place]) 2017    Left groin; Dr Sallee Provencal, vascular, places IVF preop   ??? Dysphagia     Dr Luster Landsberg   ??? Esophageal obstruction     Dr Luster Landsberg   ??? History of fall 09/05/2018    see brief documentation on 09/05/2018   ??? Insomnia 07/25/2018   ??? Morbid obesity ([place]) 03/11/2016   ??? Spasm of back muscles 07/25/2018   ??? 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 Urinary urgency 06/05/2019     Past Surgical History:   Procedure Laterality Date   ??? COLONOSCOPY N/A 10/12/2017    COLONOSCOPY performed by Alferd Patee, MD at [facility]   ??? COLONOSCOPY N/A 10/12/2018    COLONOSCOPY performed by Alferd Patee, MD at [facility] ENDOSCOPY   ??? HAND/FINGER SURGERY UNLISTED Bilateral     CTR   ??? HX ADENOIDECTOMY     ??? HX CHOLECYSTECTOMY  2013   ??? HX COLONOSCOPY  1999    screening - neg  results per patient   ??? HX ENDOSCOPY      screening - neg results per patient   ??? HX ENDOSCOPY  10/12/2018    Dr Judie Grieve, [facility]   ??? HX GYN      ectopic preg with tube rupture   ??? HX KNEE REPLACEMENT Right 12/2017     Dr Vinson Moselle, ortho   ??? HX LUMBAR FUSION  3086    V7-Q4 - complicated by vascular injury   ??? HX ORTHOPAEDIC      left foot surgery - stress fx   ??? HX ORTHOPAEDIC      L &R carpal tunnel   ??? HX TONSILLECTOMY     ??? IR IVC FILTER  2017    removed June 2017 noted in care everywhere   ??? PR ABDOMEN SURGERY PROC UNLISTED  2017    Open abdomen management (14 days) after vascular injury during lumbar fusion   ??? PR ANESTH,SURGERY OF SHOULDER  05/03/2019    left shoulder   ??? VASCULAR SURGERY PROCEDURE UNLIST      vascular repair of aortic/venocaval  repair.      Current Outpatient Medications   Medication Sig   ??? azithromycin (ZITHROMAX) 250 mg tablet Take 2 tablets today, then take 1 tablet daily   ??? HYDROcodone-chlorpheniramine (Tussionex Pennkinetic ER) 10-8 mg/5 mL suspension Take 5 mL by mouth every twelve (12) hours as needed for Cough for up to 7 days. Max Daily Amount: 10 mL. For cough  Indications: cough   ??? celecoxib (CeleBREX) 200 mg capsule Take 1 Capsule by mouth two (2) times a day.   ??? DULoxetine (CYMBALTA) 60 mg capsule Take 1 Capsule by mouth nightly.   ??? baclofen (LIORESAL) 10 mg tablet Take 1 Tablet by mouth three (3) times daily as needed for Muscle Spasm(s) or Pain.   ??? celecoxib (CELEBREX) 200 mg capsule TAKE ONE CAPSULE BY MOUTH TWICE A DAY WITH A MEAL   ??? meloxicam (Mobic) 15 mg tablet Take 1 Tab by mouth daily (with breakfast).   ??? traZODone (DESYREL) 100 mg tablet Take 1 Tab by mouth nightly.   ??? oxybutynin (DITROPAN) 5 mg tablet Take 1 Tab by mouth two (2) times a day. Indications: a condition where the urge to urinate results in urine leakage   ??? cyclobenzaprine (FLEXERIL) 10 mg tablet Take 1 Tab by mouth two (2) times daily as needed for Muscle Spasm(s). Indications: muscle spasm   ???  topiramate (Topamax) 25 mg tablet Take  by mouth two (2) times a day.   ??? bisacodyL (Dulcolax, bisacodyl,) 5 mg EC tablet Take 5 mg by mouth daily as needed for Constipation.     No current facility-administered medications for this visit.     Allergies and Intolerances:   Allergies   Allergen Reactions   ??? Propoxyphene N-Acetaminophen Hives     Other reaction(s): Unknown   ??? Propoxyphene Other (comments)     Reaction unknown   ??? Norco [Hydrocodone-Acetaminophen] Itching     Family History:   Family History   Problem Relation Age of Onset   ??? Hypertension Mother    ??? Heart Disease Father    ??? Hypertension Father    ??? Heart Disease Paternal Aunt    ??? Heart Disease Paternal Uncle    ??? Heart Disease Paternal Grandmother      Social History:   She  reports that she has never smoked. She has never used smokeless tobacco.   Social History     Substance and Sexual Activity   Alcohol Use No     Immunization History:    There is no immunization history on file for this patient.    Review of Systems:   As above included in HPI.  Otherwise 11 point review of systems negative including constitutional, skin, HENT, eyes, respiratory, cardiovascular, gastrointestinal, genitourinary, musculoskeletal, endocrine, hematologic, allergy, and neurologic.    Physical:   Exam:   Vital Signs: (As obtained by patient/caregiver at home)  There were not vitals taken for this visit.     PHYSICAL EXAMINATION:  Unable to perform due to phone visit only      Impression:  Patient Active Problem List   Diagnosis Code   ??? Obesity E66.9   ??? Morbid obesity with body mass index of 50.0-59.9 in adult ([place]) E66.01, Z68.43   ??? Insomnia G47.00   ??? Spinal stenosis of lumbar region M48.061   ??? 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 First degree heart block by electrocardiogram I44.0   ??? Urinary urgency R39.15   ??? Chronic constipation K59.09   ??? Abdominal  swelling R19.00   ??? Acute cystitis without hematuria N30.00   ??? Acute respiratory failure with hypercapnia  (HCC) J96.02   ??? Atelectasis J98.11   ??? Biliary dyskinesia K82.8   ??? Carpal tunnel syndrome G56.00   ??? 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 Chest pain R07.9   ??? Condyloma acuminatum A63.0   ??? DVT (deep venous thrombosis) (HCC) I82.409   ??? Dysphagia R13.10   ??? Lipid screening Z13.220   ??? Encounter for imaging study to confirm nasogastric (NG) tube placement Z01.89   ??? Fatigue R53.83   ??? Fever, unspecified R50.9   ??? Heartburn R12   ??? Hematoma of abdominal wall S30.1XXA   ??? Hemorrhagic shock (HCC) R57.8   ??? Hypertriglyceridemia E78.1   ??? Hypervolemia E87.70   ??? Injury of left iliac artery S35.512A   ??? Iron deficiency anemia D50.9   ??? Left foot pain M79.672   ??? Left upper quadrant pain R10.12   ??? Migraine G43.909   ??? Non-intractable vomiting with nausea R11.2   ??? Osteoarthritis of right knee M17.11   ??? Other headache syndrome G44.89   ??? Pain of left upper extremity M79.602   ??? Pain of right thigh M79.651   ??? Palpitations R00.2   ??? Pre-operative clearance Z01.818   ??? Pressure ulcer L89.90   ??? PVD (peripheral vascular disease) (HCC) I73.9   ??? S/P IVC filter Z95.828   ??? Snoring R06.83   ??? Urge incontinence N39.41   ??? Incontinence of feces R15.9   ??? Ventral hernia without obstruction or gangrene K43.9       Plan:    ICD-10-CM ICD-9-CM    1. Acute URI  J06.9 465.9 azithromycin (ZITHROMAX) 250 mg tablet      HYDROcodone-chlorpheniramine (Tussionex Pennkinetic ER) 10-8 mg/5 mL suspension   2. Cough productive of purulent sputum  R05 786.2      ???Upper respiratory infection  Initiate azithromycin  Initiate Tussionex  Instructed patient to hold baclofen while taking Tussionex  Instructed pt to avoid certain foods and beverages such as dairy products, that may cause increased or thickened secretions; sleep with head and shoulders elevated to help decrease coughing; and to utilize a cool mist humidifier to assist with keeping the air moist thus avoid agitating the lungs.       Dr. Gaspar Bidding, AGNP-C, DNP  [facility]       Total time: 14 minutes spent with the patient on counseling, answering questions, and/or coordination of care.         Tiffany [name], who was evaluated through a synchronous (real-time) audio only encounter, and/or her healthcare decision maker, is aware that it is a billable service, with coverage as determined by her insurance carrier. She provided verbal consent to proceed: Yes, and patient identification was verified. It was conducted pursuant to the emergency declaration under the [place] waiver authority and the [employer] Act. A caregiver was present when appropriate. Ability to conduct physical exam was limited. I was in the office. The patient was at home.

## 2019-10-21 MED ORDER — BACLOFEN 10 MG TAB
10 mg | ORAL_TABLET | ORAL | 1 refills | Status: DC
Start: 2019-10-21 — End: 2019-12-11

## 2019-11-18 ENCOUNTER — Ambulatory Visit: Payer: MEDICARE | Attending: Physical Medicine & Rehabilitation | Primary: Nurse Practitioner

## 2019-11-27 ENCOUNTER — Encounter: Payer: MEDICARE | Attending: Physical Medicine & Rehabilitation | Primary: Nurse Practitioner

## 2019-11-27 ENCOUNTER — Encounter: Payer: MEDICARE | Attending: Nurse Practitioner | Primary: Nurse Practitioner

## 2019-12-02 ENCOUNTER — Ambulatory Visit
Admit: 2019-12-02 | Discharge: 2019-12-02 | Payer: MEDICARE | Attending: Nurse Practitioner | Primary: Nurse Practitioner

## 2019-12-02 ENCOUNTER — Inpatient Hospital Stay: Admit: 2019-12-02 | Payer: MEDICARE | Primary: Nurse Practitioner

## 2019-12-02 ENCOUNTER — Ambulatory Visit: Attending: Nurse Practitioner | Primary: Nurse Practitioner

## 2019-12-02 DIAGNOSIS — Z0000 Encounter for general adult medical examination without abnormal findings: Secondary | ICD-10-CM

## 2019-12-02 DIAGNOSIS — R002 Palpitations: Secondary | ICD-10-CM

## 2019-12-02 LAB — TSH + FREE T4 PANEL
T4 Free: 1 NG/DL (ref 0.7–1.5)
TSH: 1.49 u[IU]/mL (ref 0.36–3.74)

## 2019-12-02 LAB — TSH AND FREE T4
T4, Free: 1 NG/DL (ref 0.7–1.5)
TSH: 1.49 u[IU]/mL (ref 0.36–3.74)

## 2019-12-02 MED ORDER — OXYBUTYNIN CHLORIDE 5 MG TAB
5 mg | ORAL_TABLET | Freq: Two times a day (BID) | ORAL | 1 refills | Status: DC
Start: 2019-12-02 — End: 2020-04-27

## 2019-12-02 MED ORDER — LINACLOTIDE 72 MCG CAPSULE
72 mcg | ORAL_CAPSULE | Freq: Every day | ORAL | 1 refills | Status: DC | PRN
Start: 2019-12-02 — End: 2020-02-17

## 2019-12-02 MED ORDER — TRAZODONE 150 MG TAB
150 mg | ORAL_TABLET | Freq: Every evening | ORAL | 1 refills | Status: DC
Start: 2019-12-02 — End: 2020-04-27

## 2019-12-02 NOTE — Progress Notes (Signed)
 Chief Complaint   Patient presents with   . Follow-up   . Abdominal Pain     pt c/o lower stomach pain onset 2 weeks ago       1. Have you been to the ER, urgent care clinic since your last visit?  Hospitalized since your last visit?No    2. Have you seen or consulted any other health care providers outside of the [facility] since your last visit?  Include any pap smears or colon screening.   No.

## 2019-12-02 NOTE — ACP (Advance Care Planning) (Signed)
Advance Care Planning     Advance Care Planning (ACP) Physician/NP/PA Conversation      Date of Conversation: 12/02/2019  Conducted with: Patient with Decision Making Capacity    Healthcare Decision Maker:     Primary Decision Maker: Clarise Cruz surgery update and pick up - Spouse - [phone]  Click here to complete [place] including selection of the Healthcare Decision Maker Relationship (ie "Primary")        Today we documented Decision Maker(s) consistent with Legal Next of Kin hierarchy.    Care Preferences:    Hospitalization: "If your health worsens and it becomes clear that your chance of recovery is unlikely, what would be your preference regarding hospitalization?"  The patient would prefer hospitalization.    Ventilation: "If you were unable to breathe on your own and your chance of recovery was unlikely, what would be your preference about the use of a ventilator (breathing machine) if it was available to you?"   The patient would desire the use of a ventilator.    Resuscitation: "In the event your heart stopped as a result of an underlying serious health condition, would you want attempts to be made to restart your heart, or would you prefer a natural death?"   Yes, attempt to resuscitate.    Additional topics discussed: ventilation preferences, hospitalization preferences and resuscitation preferences    Conversation Outcomes / Follow-Up Plan:   ACP in process - completing/providing documents   Reviewed DNR/DNI and patient elects Full Code (Attempt Resuscitation)     Length of Voluntary ACP Conversation in minutes:  16 minutes    Alla Feeling, DNP

## 2019-12-02 NOTE — Progress Notes (Signed)
[facility]  [place], [place]  [phone] fax    12/02/2019    HPI:   Tiffany [name] 03-04-1970 is a pleasant BLACK/AFR[place] AMER[place] female who presents today for routine physical exam.  Pt lives with spouse , has no children, and is unemployed/disabled.      She follows Dr Dorthula Perfect, vascular, Dr Vinson Moselle, ortho, Dr Domingo Pulse, shoulder, and Dr Judie Grieve, GI.     Insomnia: Starting back in June 2020, pt was getting 2.5-3 hours sleep at night. She used to take ambien in the past. She has been taking trazodone 100mg . Although her insomnia has improved, she believes this medication needs to be increased. She used to be treated for anxiety in the past.    Intermittent palpitations: Symptoms come and go approximately 1 time per week.  Not sure if her symptoms are related to anxiety.    Chronic constipation: Usually has BM 1x/w. Dulcolax works sometimes.  Tried MiraLAX which was not effective.  Have increased fiber in diet with no improvement with bowel movements.  Had attempted colonoscopy in 2019 but unable to complete due to presence of stool-Dr Judie Grieve.     Chronic back pain: sees Dr Vella Kohler, ortho as scheduled.  No longer seeing Dr Arma Heading and no longer taking tylenol with codeine since she is being treated by Dr. Domingo Pulse for her left shoulder pain. Pain is in lower back and down both legs.  Dr Vella Kohler prescribes Cymbalta therapy and pt takes aleve as needed.    Muscle spasms: She continues to suffer from spasms to her lower back. Flexeril effective.     Status post right knee replacement: She had RTK replacement by Dr Vinson Moselle, ortho, Oct 2019. He had prescribed meloxicam for chronic knee pain.    Chronic left shoulder pain: Back July 2020 she fell in the hallway in her home while being chased by an aggressive Rottweiler that a neighbor had given her.  She landed on her left shoulder.   She follows up with Dr. Domingo Pulse as scheduled.    Urinary  urgency: Pt was prescribed Myrbetriq 25mg  in the past for bladder control by the [facility]. She saw a urologist there that would come every so often to the clinic. Patient reports she was taking it twice a day. In July 2020, she requested to be placed back on myrbetriq because she is having frequent incontinence and urinary incontinence. In Oct 2020, pt was placed back on Myrbetriq but never started the medication due to cost.  She is taking Ditropan and tolerating well.    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 swelling goes down at night with elevation. Follow up with Dr Dorthula Perfect, vascular, who places IVF preop due to hx of DVTs. Jan 2021 started wearing compression hose to both LEs. Helps with reducing swelling during the day.     Past Medical History:   Diagnosis Date   ??? Arthritis    ??? Biliary dyskinesia 08/08/2011   ??? Carpal tunnel syndrome 01/26/2005   ??? Chronic constipation 07/25/2018    Dr Donovan Kail   ??? Chronic low back pain with sciatica 07/25/2018    Dr Berna Spare mgt   ??? Chronic pain of right knee 07/25/2018     Dr Vinson Moselle, ortho   ??? Constipation due to opioid therapy 07/25/2018   ??? DVT (deep venous thrombosis) ([place]) 2017    Left groin; Dr Sallee Provencal, vascular,  places IVF preop   ??? Dysphagia     Dr Luster Landsberg   ??? Esophageal obstruction     Dr Luster Landsberg   ??? Hemorrhagic shock ([place]) 09/17/2019   ??? History of fall 09/05/2018    see brief documentation on 09/05/2018   ??? 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 Insomnia 07/25/2018   ??? Migraine 02/11/2006    Formatting of this note might be different from the original. Updated invalid ICD 9 codes for ICD 10 go live   ??? Morbid obesity ([place]) 03/11/2016   ??? Spasm of back muscles 07/25/2018   ??? 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 Tear of left rotator cuff 12/02/2019   ??? Urinary urgency 06/05/2019   ??? Ventral hernia without obstruction or gangrene 08/31/2016     Past Surgical History:   Procedure  Laterality Date   ??? COLONOSCOPY N/A 10/12/2017    COLONOSCOPY performed by Alferd Patee, MD at [facility]   ??? COLONOSCOPY N/A 10/12/2018    COLONOSCOPY performed by Alferd Patee, MD at [facility] ENDOSCOPY   ??? HAND/FINGER SURGERY UNLISTED Bilateral     CTR   ??? HX ADENOIDECTOMY     ??? HX CHOLECYSTECTOMY  2013   ??? HX COLONOSCOPY  1999    screening - neg results per patient   ??? HX ENDOSCOPY      screening - neg results per patient   ??? HX ENDOSCOPY  10/12/2018    Dr Judie Grieve, GL[facility]   ??? HX GYN      ectopic preg with tube rupture   ??? HX KNEE REPLACEMENT Right 12/2017     Dr Vinson Moselle, ortho   ??? HX LUMBAR FUSION  2951    O8-C1 - complicated by vascular injury   ??? HX ORTHOPAEDIC      left foot surgery - stress fx   ??? HX ORTHOPAEDIC      L &R carpal tunnel   ??? HX TONSILLECTOMY     ??? IR IVC FILTER  2017    removed June 2017 noted in care everywhere   ??? PR ABDOMEN SURGERY PROC UNLISTED  2017    Open abdomen management (14 days) after vascular injury during lumbar fusion   ??? PR ANESTH,SURGERY OF SHOULDER  05/03/2019    left shoulder   ??? VASCULAR SURGERY PROCEDURE UNLIST      vascular repair of aortic/venocaval repair.      Current Outpatient Medications   Medication Sig   ??? oxybutynin (DITROPAN) 5 mg tablet Take 1 Tablet by mouth two (2) times a day. Indications: a condition where the urge to urinate results in urine leakage   ??? traZODone (DESYREL) 150 mg tablet Take 1 Tablet by mouth nightly.   ??? linaCLOtide (Linzess) 72 mcg cap capsule Take 1 Capsule by mouth daily as needed (constipation).   ??? baclofen (LIORESAL) 10 mg tablet TAKE ONE TABLET BY MOUTH THREE TIMES A DAY AS NEEDED FOR MUSCLE SPASMS OR FOR PAIN   ??? celecoxib (CeleBREX) 200 mg capsule Take 1 Capsule by mouth two (2) times a day.   ??? DULoxetine (CYMBALTA) 60 mg capsule Take 1 Capsule by mouth nightly.   ??? bisacodyL (Dulcolax, bisacodyl,) 5 mg EC tablet Take 5 mg by mouth daily as needed for Constipation.     No current facility-administered medications for this visit.      Allergies and Intolerances:   Allergies   Allergen Reactions   ???  Propoxyphene N-Acetaminophen Hives     Other reaction(s): Unknown   ??? Propoxyphene Other (comments)     Reaction unknown   ??? Norco [Hydrocodone-Acetaminophen] Itching     Family History:   Family History   Problem Relation Age of Onset   ??? Hypertension Mother    ??? Heart Disease Father    ??? Hypertension Father    ??? Heart Disease Paternal Aunt    ??? Heart Disease Paternal Uncle    ??? Heart Disease Paternal Grandmother      Social History:   She  reports that she has never smoked. She has never used smokeless tobacco.   Social History     Substance and Sexual Activity   Alcohol Use No     Immunization History:    There is no immunization history on file for this patient.    Review of Systems:   As above included in HPI.  Otherwise 11 point review of systems negative including constitutional, skin, HENT, eyes, respiratory, cardiovascular, gastrointestinal, genitourinary, musculoskeletal, endocrine, hematologic, allergy, and neurologic.      Physical:   Visit Vitals  BP 117/74   Pulse 71   Temp 97.3 ??F (36.3 ??C) (Temporal)   Resp 16   Ht 5\' 3"  (1.6 m)   Wt 295 lb 9.6 oz (134.1 kg)   SpO2 98%   BMI 52.36 kg/m??      Wt Readings from Last 3 Encounters:   12/02/19 295 lb 9.6 oz (134.1 kg)   09/26/19 291 lb (132 kg)   09/17/19 299 lb (135.6 kg)         Exam:   Physical Exam  Constitutional:       Appearance: Normal appearance. She is obese.      Comments: Morbidly obese   HENT:      Head: Normocephalic and atraumatic.      Right Ear: External ear normal.      Left Ear: External ear normal.   Eyes:      Extraocular Movements: Extraocular movements intact.      Conjunctiva/sclera: Conjunctivae normal.   Neck:      Vascular: No carotid bruit.   Cardiovascular:      Rate and Rhythm: Normal rate and regular rhythm.      Pulses: Normal pulses.      Heart sounds: Normal heart sounds.      Comments: No edema noted to bil LEs.  Pulmonary:      Effort: Pulmonary effort  is normal. No respiratory distress.      Breath sounds: Normal breath sounds. No wheezing.   Abdominal:      General: Abdomen is flat. Bowel sounds are normal. There is no distension.      Palpations: Abdomen is soft.      Tenderness: There is no abdominal tenderness.   Musculoskeletal:         General: Normal range of motion.      Cervical back: Normal range of motion.      Comments: Gait stable. No amb device needed.    Skin:     General: Skin is warm and dry.   Neurological:      General: No focal deficit present.      Mental Status: She is alert and oriented to person, place, and time.   Psychiatric:         Mood and Affect: Mood normal.         Behavior: Behavior normal.  Thought Content: Thought content normal.         Judgment: Judgment normal.        Body mass index is 52.36 kg/m??.     Review of Data:  Labs reviewed: N/A      Impression:  Patient Active Problem List   Diagnosis Code   ??? Morbid obesity with body mass index of 50.0-59.9 in adult ([place]) E66.01, Z68.43   ??? Insomnia G47.00   ??? Spinal stenosis of lumbar region M48.061   ??? First degree heart block by electrocardiogram I44.0   ??? Urinary urgency R39.15   ??? Chronic constipation K59.09   ??? Hypertriglyceridemia E78.1   ??? Iron deficiency anemia D50.9   ??? Intermittent palpitations R00.2   ??? S/P IVC filter [id number]       Plan:    ICD-10-CM ICD-9-CM    1. Medicare annual wellness visit, subsequent  Z00.00 V70.0 FULL CODE      PR BEHAVIOR COUNSEL OBESITY 64M   2. Advanced directives, counseling/discussion  Z71.89 V65.49 ADVANCE CARE PLANNING FIRST 30 MINS   3. Body mass index 50.0-59.9, adult (HCC)  Z68.43 V85.43 PR BEHAVIOR COUNSEL OBESITY 64M   4. Encounter for screening mammogram for malignant neoplasm of breast  Z12.31 V76.12 MAM MAMMO BI SCREENING INCL CAD   5. Insomnia, unspecified type  G47.00 780.52 traZODone (DESYREL) 150 mg tablet   6. Chronic constipation  K59.09 564.00 linaCLOtide (Linzess) 72 mcg cap capsule   7. Intermittent palpitations   R00.2 785.1 TSH AND FREE T4   8. Chronic bilateral low back pain with bilateral sciatica  M54.42 724.2     M54.41 724.3     G89.29 338.29    9. Urinary urgency  R39.15 788.63 oxybutynin (DITROPAN) 5 mg tablet   10. Tear of left rotator cuff, unspecified tear extent, unspecified whether traumatic  M75.102 840.4    11. Cervical cancer screening  Z12.4 V76.2 REFERRAL TO GYNECOLOGY   12. Influenza vaccination declined  Z28.21 V64.06      ???MCR/ACP  MCR Wellness: Reviewed all HM and ordered tests/imaging appropriately. Reviewed care teams and medications with updates. Advanced Care Planning: Patient educated on the importance of an advanced care plan and paperwork was given to the patient today. Asked patient to read over the information and bring back at next appointment.    Placed order for mammogram  Placed referral for cervical cancer screening    ???Body mass index 50.0???59.9  Weight management:  BMI is out of normal parameters and plan is as follows: Discussed in great detail on diet, portion control, exercise, avoiding foods high in sugar, carbs and starches, fatty/greasy foods and to eat until satisfied not til full. I have counseled this patient on diet and exercise regimens as well. Patient verbalized understanding.   Discussion about modifying behaviors regarding diet and exercise undertaken.  Increase activity as tolerated   -Pt is motivated   Cut back on carbs and fatty foods  Calorie counting would be beneficial  Weight loss options: Weight Watchers, exercise buddy, Noom, GOLO  Discussed referral to nutritionist for further counseling              -Pt declined  Discussed weight loss target of 5% over 6-12 months being a realistic goal .  Will reevaluate wight loss and goals along with management at subsequent visits.  Total time: 15 minutes     -Insomnia, unspecified type  Increase trazodone to 150 mg nightly  Instructed pt on the importance of going  to bed the same time every night and getting up the same time  every morning, even on the weekends. Instructed pt to try and relax, cut TV off in the room, avoid caffeine and/or heavy foods a few hours before going to bed. Pt to avoid daytime napping. Patient verbalized understanding.    ???Chronic constipation  Stool softener, increase fiber, MiraLAX???not effective  Initiate Linzess  Continue to increase fiber in diet and use of stool softeners  Avoid foods that may cause constipation    ???Intermittent palpitations  Consulted with cardiology March 2021  No need for follow-up  Instructed patient to pay attention to what is going on in her life when she feels palpitations; if she is stressed, suffering with anxiety consume too much caffeine.  If palpitations become more regular, will discuss possible treatment for anxiety  Obtain TSH/T4    -Chronic low back pain with sciatica, sciatica laterality unspecified, unspecified back pain laterality  In 2017 in [place], [place], pt underwent back surgery for a herniated disc where she experienced vascular complications. She was in ICU critical care x14 days on a vent for recovery. She developed a DVT in left groin. Since then she has undergone 8 additional surgeries related to the vascular complication.   She is no longer in pain management due to receiving pain therapy via Dr. Domingo Pulse for left shoulder pain    IVF filter must be placed by Dr Dorthula Perfect when patient needs to have a surgical procedure.    Dr Vella Kohler, ortho, prescribes cymbalta therapy.    -Urinary urgency  Continue oxybutynin as prescribed    ???Chronic left shoulder pain   Follow-up with Dr. Domingo Pulse as scheduled  Continue baclofen and Celebrex as prescribed    -Influenza vaccine declined  Pt educated on the importance of obtaining the influenza vaccines. Pt verb understanding.        Follow up 6 months  Labs needed 1 week prior to appt: No    Dr. Gaspar Bidding, AGNP-C, DNP  [facility]       The total face time was 30 minutes. Greater than 50% of that time was spent  in counseling and/or coordination of care. My summary of patient counseling and coordination of care includes Reviewing medical record, assessing patient, placing orders, and discussing plan of care with patient.      Time does not include AWV and ACP.

## 2019-12-02 NOTE — Progress Notes (Signed)
Pls inform pt thy levels are normal. Thank you

## 2019-12-02 NOTE — Progress Notes (Signed)
This is the Subsequent Medicare Annual Wellness Exam, performed 12 months or more after the Initial AWV or the last Subsequent AWV    I have reviewed the patient's medical history in detail and updated the computerized patient record.       Assessment/Plan   Education and counseling provided:  Are appropriate based on today's review and evaluation  Influenza Vaccine  Screening Mammography  Screening Pap and pelvic (covered once every 2 years)  Screening for glaucoma  Overall patient is doing well    1. Medicare annual wellness visit, subsequent  -     FULL CODE  -     PR BEHAVIOR COUNSEL OBESITY 23M  2. Advanced directives, counseling/discussion  -     ADVANCE CARE PLANNING FIRST 30 MINS  3. Body mass index 50.0-59.9, adult (HCC)  -     PR BEHAVIOR COUNSEL OBESITY 23M  4. Encounter for screening mammogram for malignant neoplasm of breast  -     MAM MAMMO BI SCREENING INCL CAD; Future  5. Insomnia, unspecified type  -     traZODone (DESYREL) 150 mg tablet; Take 1 Tablet by mouth nightly., Normal, Disp-90 Tablet, R-1  6. Chronic constipation  -     linaCLOtide (Linzess) 72 mcg cap capsule; Take 1 Capsule by mouth daily as needed (constipation)., Normal, Disp-90 Capsule, R-1  7. Intermittent palpitations  -     TSH AND FREE T4; Future  8. Chronic bilateral low back pain with bilateral sciatica  9. Urinary urgency  -     oxybutynin (DITROPAN) 5 mg tablet; Take 1 Tablet by mouth two (2) times a day. Indications: a condition where the urge to urinate results in urine leakage, Normal, Disp-180 Tablet, R-1  10. Tear of left rotator cuff, unspecified tear extent, unspecified whether traumatic  11. Cervical cancer screening  -     REFERRAL TO GYNECOLOGY  12. Influenza vaccination declined       Depression Risk Factor Screening     3 most recent PHQ Screens 09/26/2019   Little interest or pleasure in doing things Not at all   Feeling down, depressed, irritable, or hopeless Not at all   Total Score PHQ 2 0       Alcohol Risk  Screen    Do you average more than 1 drink per night or more than 7 drinks a week:  No    On any one occasion in the past three months have you have had more than 3 drinks containing alcohol:  No        Functional Ability and Level of Safety    Hearing: Hearing is good.      Activities of Daily Living:  The home contains: no safety equipment.  Patient does total self care      Ambulation: with no difficulty     Fall Risk:  Fall Risk Assessment, last 12 mths 04/05/2019   Able to walk? Yes   Fall in past 12 months? 1   Do you feel unsteady? 1   Are you worried about falling 1   Number of falls in past 12 months 2   Fall with injury? 1      Abuse Screen:  Patient is not abused       Cognitive Screening    Has your family/caregiver stated any concerns about your memory: no     Cognitive Screening: Normal - Clock Drawing Test    Health Maintenance Due     Health Maintenance  Due   Topic Date Due   ??? Hepatitis C Screening  Never done   ??? Cervical cancer screen  Never done   ??? Breast Cancer Screen Mammogram  10/23/2019       Patient Care Team   Patient Care Team:  Aleksis Jiggetts, Donald Prose, DNP as PCP - General (Nurse Practitioner)  Alla Feeling, DNP as PCP - [facility] Empaneled Provider  Marshell Garfinkel, DO (Orthopedic Surgery)  Alferd Patee, MD (Gastroenterology)    History     Patient Active Problem List   Diagnosis Code   ??? Morbid obesity with body mass index of 50.0-59.9 in adult [facility]) E66.01, Z68.43   ??? Insomnia G47.00   ??? Spinal stenosis of lumbar region M48.061   ??? First degree heart block by electrocardiogram I44.0   ??? Urinary urgency R39.15   ??? Chronic constipation K59.09   ??? Hypertriglyceridemia E78.1   ??? Iron deficiency anemia D50.9   ??? Intermittent palpitations R00.2   ??? S/P IVC filter [id number]     Past Medical History:   Diagnosis Date   ??? Arthritis    ??? Biliary dyskinesia 08/08/2011   ??? Carpal tunnel syndrome 01/26/2005   ??? Chronic constipation 07/25/2018    Dr Donovan Kail   ??? Chronic low back pain with sciatica  07/25/2018    Dr Francee Gentile   ??? Chronic pain of right knee 07/25/2018     Dr Vinson Moselle, ortho   ??? Constipation due to opioid therapy 07/25/2018   ??? DVT (deep venous thrombosis) ([place]) 2017    Left groin; Dr Sallee Provencal, vascular, places IVF preop   ??? Dysphagia     Dr Luster Landsberg   ??? Esophageal obstruction     Dr Luster Landsberg   ??? Hemorrhagic shock ([place]) 09/17/2019   ??? History of fall 09/05/2018    see brief documentation on 09/05/2018   ??? 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 Insomnia 07/25/2018   ??? Migraine 02/11/2006    Formatting of this note might be different from the original. Updated invalid ICD 9 codes for ICD 10 go live   ??? Morbid obesity ([place]) 03/11/2016   ??? Spasm of back muscles 07/25/2018   ??? 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 Tear of left rotator cuff 12/02/2019   ??? Urinary urgency 06/05/2019   ??? Ventral hernia without obstruction or gangrene 08/31/2016      Past Surgical History:   Procedure Laterality Date   ??? COLONOSCOPY N/A 10/12/2017    COLONOSCOPY performed by Alferd Patee, MD at [place]   ??? COLONOSCOPY N/A 10/12/2018    COLONOSCOPY performed by Alferd Patee, MD at [facility] ENDOSCOPY   ??? HAND/FINGER SURGERY UNLISTED Bilateral     CTR   ??? HX ADENOIDECTOMY     ??? HX CHOLECYSTECTOMY  2013   ??? HX COLONOSCOPY  1999    screening - neg results per patient   ??? HX ENDOSCOPY      screening - neg results per patient   ??? HX ENDOSCOPY  10/12/2018    Dr Judie Grieve, GLST   ??? HX GYN      ectopic preg with tube rupture   ??? HX KNEE REPLACEMENT Right 12/2017     Dr Vinson Moselle, ortho   ??? HX LUMBAR FUSION  5284    X3-K4 - complicated by vascular injury   ??? HX ORTHOPAEDIC      left foot surgery -  stress fx   ??? HX ORTHOPAEDIC      L &R carpal tunnel   ??? HX TONSILLECTOMY     ??? IR IVC FILTER  2017    removed June 2017 noted in care everywhere   ??? PR ABDOMEN SURGERY PROC UNLISTED  2017    Open abdomen management (14 days) after vascular injury during lumbar fusion   ??? PR ANESTH,SURGERY OF SHOULDER   05/03/2019    left shoulder   ??? VASCULAR SURGERY PROCEDURE UNLIST      vascular repair of aortic/venocaval repair.      Current Outpatient Medications   Medication Sig Dispense Refill   ??? oxybutynin (DITROPAN) 5 mg tablet Take 1 Tablet by mouth two (2) times a day. Indications: a condition where the urge to urinate results in urine leakage 180 Tablet 1   ??? traZODone (DESYREL) 150 mg tablet Take 1 Tablet by mouth nightly. 90 Tablet 1   ??? linaCLOtide (Linzess) 72 mcg cap capsule Take 1 Capsule by mouth daily as needed (constipation). 90 Capsule 1   ??? baclofen (LIORESAL) 10 mg tablet TAKE ONE TABLET BY MOUTH THREE TIMES A DAY AS NEEDED FOR MUSCLE SPASMS OR FOR PAIN 60 Tablet 1   ??? celecoxib (CeleBREX) 200 mg capsule Take 1 Capsule by mouth two (2) times a day. 60 Capsule 2   ??? DULoxetine (CYMBALTA) 60 mg capsule Take 1 Capsule by mouth nightly. 30 Capsule 1   ??? bisacodyL (Dulcolax, bisacodyl,) 5 mg EC tablet Take 5 mg by mouth daily as needed for Constipation.       Allergies   Allergen Reactions   ??? Propoxyphene N-Acetaminophen Hives     Other reaction(s): Unknown   ??? Propoxyphene Other (comments)     Reaction unknown   ??? Norco [Hydrocodone-Acetaminophen] Itching       Family History   Problem Relation Age of Onset   ??? Hypertension Mother    ??? Heart Disease Father    ??? Hypertension Father    ??? Heart Disease Paternal Aunt    ??? Heart Disease Paternal Uncle    ??? Heart Disease Paternal Grandmother      Social History     Tobacco Use   ??? Smoking status: Never Smoker   ??? Smokeless tobacco: Never Used   Substance Use Topics   ??? Alcohol use: No         Alla Feeling, DNP

## 2019-12-03 NOTE — Telephone Encounter (Signed)
-----   Message from Alla Feeling, [place] sent at 12/02/2019  6:13 PM EDT -----  Pls inform pt thy levels are normal. Thank you

## 2019-12-03 NOTE — Telephone Encounter (Signed)
LVM for patient to return call.

## 2019-12-03 NOTE — Telephone Encounter (Signed)
Patient returned call and gave patient results.  Patient verbalized understanding and no additional questions at this time.

## 2019-12-04 ENCOUNTER — Encounter: Attending: Nurse Practitioner | Primary: Nurse Practitioner

## 2019-12-05 ENCOUNTER — Encounter

## 2019-12-11 ENCOUNTER — Encounter

## 2019-12-11 MED ORDER — BACLOFEN 10 MG TAB
10 mg | ORAL_TABLET | ORAL | 1 refills | Status: DC
Start: 2019-12-11 — End: 2020-05-05

## 2019-12-13 ENCOUNTER — Ambulatory Visit: Payer: MEDICARE | Attending: Physical Medicine & Rehabilitation | Primary: Nurse Practitioner

## 2019-12-21 ENCOUNTER — Encounter

## 2020-01-08 ENCOUNTER — Encounter

## 2020-01-08 MED ORDER — DULOXETINE 60 MG CAP, DELAYED RELEASE
60 mg | ORAL_CAPSULE | ORAL | 1 refills | Status: DC
Start: 2020-01-08 — End: 2020-06-10

## 2020-01-08 NOTE — Telephone Encounter (Signed)
Ok this refill, no additional refills until seen

## 2020-01-08 NOTE — Telephone Encounter (Signed)
Patient has appointment on 01-13-20

## 2020-01-13 ENCOUNTER — Encounter: Payer: MEDICARE | Attending: Physical Medicine & Rehabilitation | Primary: Nurse Practitioner

## 2020-02-17 ENCOUNTER — Ambulatory Visit
Admit: 2020-02-17 | Discharge: 2020-02-17 | Payer: MEDICARE | Attending: Physical Medicine & Rehabilitation | Primary: Nurse Practitioner

## 2020-02-17 ENCOUNTER — Ambulatory Visit: Attending: Physical Medicine & Rehabilitation | Primary: Nurse Practitioner

## 2020-02-17 DIAGNOSIS — M961 Postlaminectomy syndrome, not elsewhere classified: Secondary | ICD-10-CM

## 2020-02-17 MED ORDER — DULOXETINE 30 MG CAP, DELAYED RELEASE
30 mg | ORAL_CAPSULE | Freq: Every evening | ORAL | 2 refills | Status: DC
Start: 2020-02-17 — End: 2020-04-02

## 2020-02-17 NOTE — Progress Notes (Signed)
Progress Notes by Lilian Coma, MD at 02/17/20 1130                Author: Lilian Coma, MD  Service: --  Author Type: Physician       Filed: 02/17/20 1215  Encounter Date: 02/17/2020  Status: Signed          Editor: Lilian Coma, MD (Physician)                       [facility]  [address] [place] [address]  Phone: [phone]  Fax: [phone]            PROGRESS NOTE         HISTORY OF PRESENT ILLNESS:   The patient is a 50 y.o.  female and was seen today for follow up of progressive lower back pain radiating into the BLE (LLE>RLE) in a S1 distribution to the feet involving the digits x 07/2018 without specific trauma.??Pt  has??a hx of L5-S1 fusion??performed in 03/2015 by Dr. Nena Polio Nundkumar??in [place], [place].??Symptoms prior to her surgery consisted  of low back pain extending posteriorly into her LLE with paresthesias to the toes. Patient did not experienced relief after the surgery. She reports her symptoms increased after her srurgery and now she c/o low back pain extending into her BLE (L>R).  Her pain is not positional. Pt previously received lumbar injections by Dr. Hassell Done without benefit (pt reports??3 in the past 12 months). Pt completed PT in 2020 without benefit.??She??is compliant with her??HEP. She reports bladder incontinence  since her surgery x 1 years. She states??she experienced some mild incontinence before her surgery, however it has worsened.??Pt had been followed by  [facility] and was being treated with Tylenol No. 4. Pt states she is  no longer followed by [facility] secondary to taking medications through Dr. Domingo Pulse.  Pt followed up with Dr. Domingo Pulse on 04/03/2019 and per pt, she had  left rotator cuff tear repair on 05/03/2019.She is scheduled to follow up with Dr. Domingo Pulse on 09/18/2019 to discuss a total shoulder repair. The patient is??RHD. Note from??Dr. Rudi Heap??dated  02/19/16??indicating patient was seen  with c/o back pain since 03/2015. Patient underwent L5-S1 laminectomy without relief. It was noted, her??artery and??vein were??severed??and surgery had to be prolonged and was hospitalized for 40 days due  to surgical complication.??At that time she c/o paraesthesias on her BLE and UE as a result of her surgery.??Her pain is aggravated with any elongated position. At that time, she was treated with Neurontin, which she dc due to weigh gain. Now she  takes Topamax 50 mg BID for 6 months with minimum relief. Regarding Neurontin, patient reports tolerated well and her weight??gained has  not been stable since her dc Neurontin??300 mg BID. Required multiple follow surgeries including exploratory 03/27/15 to control bleeding  the the left common iliac and femoral bypass. Multiple follow up surgeries for abdominal washouts. She subsequently developed a LLE DVT, and an IVC filter was placed. Subsequently, it had been removed. Per patient, she returned to the OR to complete her  lumbar surgical fusion. That operative note was not available for our review.??Patient has a hx of injections before the surgery. Patient reports a hx of therapy before and after her surgery. She continues with her HEP daily.??Patient??admits??changes??in  bowel habits x 1 months.??She denies possibility of pregnancy??or breastfeeding.??Patient denies fever, weight loss, or skin changes. Note from??Dr. Belia Heman??dated 10/10/2017??indicating patient  underwent [employer] SCS trial.??Note  from??Dr. Berneda Rose 11/17/2017??indicating patient was seen for f/u low back pain radiating into the RLE. Pain was 6-10/10. Successful SCS trial . Indicated they were considering doing SCS implantation at the surgical center. Pt's BMI was 56 and they could not accommodate her. Indicated he was not comfortable performing surgery on her due to her obesity. She indicated that she was pursuing surgical  weight loss. Due to her previous abdominal surgery and scar tissue they would not be  able to do it laparoscopically and pt was not interested. Per pt,??Dr. Adam,??weight loss specialist at [facility] gave her 4 other specialists she could get a second  opinion from and the closest was at [place], [place].??ER note from??Dr. Kathalene Frames 12/08/2018??indicating patient presented for acute right knee pain.??Note from??[facility], Maryanna Shape, DNP??dated 01/09/2019??indicating patient was seen  with c/o chronic back pain. Indicated she is in pain management, with Dr. Nelva Bush. Pt was previously followed by Dr. Caryn Section.  SCS??trial has been beneficial for her pain. Indicated her insurance denied the stimulator she had the trial with and would only pay for??"generic brand". Indicated she cannot see Dr. Caryn Section because  she owes them money. Reported she needs assistance getting into and out of bathtub. Pt has vascular issues in BLE followed by Dr. Simon Rhein. ??Note from??Dr. Idelle Crouch??dated 03/19/2019??indicating patient was seen with c/o left shoulder pain. Rated her  pain 8/10. Limited ROM.??Suspected she had rotator cuff tear. Ordered MRI.   Note from Dr Caryn Section dated 04/08/2019 indicating patient was seen with a pain of 6/10. Indicated pt was unable to be accommodated for a SCS secondary to her BMI of 56. Lumbar spine MRI  dated??09/09/15 report??reviewed.??Films not available. 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 Good alignment at the fusion at L5-S1.?? BLE EMG??dated 02/19/16 reviewed, Is within normal limits. No evidence of lumbosacral radiculopathy.??Myelogram Lumbar ??dated 06/16/2017??films not available for my??independent review. Per report, Successful lumbar myelogram. Minimal central canal encroachment at L3/L4.??L spine CT ??dated 06/16/2017??films not??available for my??independent review. Per report, Numbering for this exam is based on counting down from T1 vertebra on CT chest abdomen pelvis from 2011. 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 Below this are 4 lumbar type vertebral bodies with the sacralized L5. This numbering places the posterior fusion hardware at L4-L5 rather than L5-S1. There is a rudimentary but visible L5-S1 disc space. Posterior  fusion hardware and interbody fusion cage at L4-L5 disc level is intact. Loss of disc and foraminal height at this level results in what appears to be moderate to marked left and moderate right foraminal stenosis. No focal disc pathology or central canal  stenosis. Mild central canal stenosis and mild foraminal narrowing bilaterally at L3/L4 with moderate facet DJD.??At??her??last clinical appointment, pt was scheduled to f/u with Dr. Idelle Crouch tomorrow for her left shoulder. Pt reported a recent  weight loss (47 Ibs)  but I recommended she should follow up with Warren Danes, D.O., bariatrics. I restarted her on Cymbalta 30 mg and 60 mg daily.   ??      The patient returns today and reports pain location and distribution remains unchanged. She  rates her pain 4-7/10, previously 7/10. Pt was last seen 09/2019 and scheduled to f/u in 2 month's time but failed to do so.The pt has had multiple  cancellations. She admits to following up with Dr. Karleen Hampshire, and per  pt denies any treatment secondary to weight loss. Pt states she has gained 7 Ibs since her last visit. She is complaint with her Cymbalta 60 mg daily. Pt states she continues to be followed  by Dr. Idelle Crouch secondary to a left shoulder rotator cuff repair done on 05/03/2019. Pt states she has been put on hold w/[facility] secondary to receiving medications through Dr. Idelle Crouch at this time. Pt denies change in bowel or bladder habits.            PMP reviewed. Body mass index is 53.67 kg/m??.      PCP: Verdon Cummins, DNP           Past Medical History:        Diagnosis  Date         ?  Arthritis       ?  Biliary dyskinesia  08/08/2011     ?  Carpal tunnel syndrome  01/26/2005     ?  Chronic constipation  07/25/2018           Dr Lawson Fiscal         ?  Chronic low back pain with sciatica  07/25/2018          Dr Amie Portland mgt         ?  Chronic pain of right knee  07/25/2018           Dr Maryann Conners, ortho         ?  Constipation due to opioid therapy  07/25/2018         ?  DVT (deep venous thrombosis) (HCC)  2017          Left groin; Dr Mila Palmer, vascular, places IVF preop         ?  Dysphagia            Dr Laymond Purser         ?  Esophageal obstruction            Dr Lawson Fiscal [facility]         ?  Hemorrhagic shock (HCC)  09/17/2019     ?  History of fall  09/05/2018          see brief documentation on 09/05/2018         ?  Injury of left iliac artery  05/28/2015     ?  Insomnia  07/25/2018     ?  Migraine  02/11/2006          Formatting of this note might be different from the original. Updated invalid ICD 9 codes for ICD 10 go live         ?  Morbid obesity (HCC)  03/11/2016     ?  Spasm of back muscles  07/25/2018     ?  Status post right knee replacement  07/25/2018           Dr Maryann Conners, ortho         ?  Tear of left rotator cuff  12/02/2019     ?  Urinary urgency  06/05/2019         ?  Ventral hernia without obstruction or gangrene  08/31/2016             Social History          Socioeconomic History         ?  Marital status:  MARRIED  Spouse name:  Not on file         ?  Number of children:  Not on file     ?  Years of education:  Not on file     ?  Highest education level:  Not on file       Occupational History        ?  Not on file       Tobacco Use         ?  Smoking status:  Never Smoker     ?  Smokeless tobacco:  Never Used       Vaping Use         ?  Vaping Use:  Never used       Substance and Sexual Activity         ?  Alcohol use:  No     ?  Drug use:  No     ?  Sexual activity:  Yes              Partners:  Male        Other Topics  Concern        ?  Not on file       Social History Narrative        ?  Not on file          Social Determinants of Health          Financial Resource Strain:         ?  Difficulty of Paying Living  Expenses: Not on file       Food Insecurity:         ?  Worried About Running Out of Food in the Last Year: Not on file     ?  Ran Out of Food in the Last Year: Not on file       Transportation Needs:         ?  Lack of Transportation (Medical): Not on file     ?  Lack of Transportation (Non-Medical): Not on file       Physical Activity:         ?  Days of Exercise per Week: Not on file     ?  Minutes of Exercise per Session: Not on file       Stress:         ?  Feeling of Stress : Not on file       Social Connections:         ?  Frequency of Communication with Friends and Family: Not on file     ?  Frequency of Social Gatherings with Friends and Family: Not on file     ?  Attends Religious Services: Not on file     ?  Active Member of Clubs or Organizations: Not on file     ?  Attends Archivist Meetings: Not on file     ?  Marital Status: Not on file       Intimate Partner Violence:         ?  Fear of Current or Ex-Partner: Not on file     ?  Emotionally Abused: Not on file     ?  Physically Abused: Not on file     ?  Sexually Abused: Not on file       Housing Stability:         ?  Unable  to Pay for Housing in the Last Year: Not on file     ?  Number of Places Lived in the Last Year: Not on file        ?  Unstable Housing in the Last Year: Not on file             Current Outpatient Medications          Medication  Sig  Dispense  Refill           ?  DULoxetine (CYMBALTA) 60 mg capsule  TAKE ONE CAPSULE BY MOUTH ONCE NIGHTLY  30 Capsule  1     ?  baclofen (LIORESAL) 10 mg tablet  TAKE ONE TABLET BY MOUTH THREE TIMES A DAY AS NEEDED FOR MUSCLE SPASMS OR FOR PAIN  60 Tablet  1     ?  oxybutynin (DITROPAN) 5 mg tablet  Take 1 Tablet by mouth two (2) times a day. Indications: a condition where the urge to urinate results in urine leakage  180 Tablet  1     ?  traZODone (DESYREL) 150 mg tablet  Take 1 Tablet by mouth nightly.  90 Tablet  1           ?  bisacodyL (Dulcolax, bisacodyl,) 5 mg EC tablet  Take 5 mg  by mouth daily as needed for Constipation.                 Allergies        Allergen  Reactions         ?  Propoxyphene N-Acetaminophen  Hives             Other reaction(s): Unknown         ?  Propoxyphene  Other (comments)             Reaction unknown         ?  Norco [Hydrocodone-Acetaminophen]  Itching               PHYSICAL EXAMINATION      Visit Vitals      Pulse  70     Temp  98.5 ??F (36.9 ??C)     Resp  16     Ht  5\' 3"  (1.6 m)     Wt  303 lb (137.4 kg)     SpO2  96%        BMI  53.67 kg/m??          CONSTITUTIONAL: NAD, A&O x 3  SENSATION : Decreased sensation to light touch on the RLE circumferentially. Otherwise, intact to light touch throughout   RANGE OF MOTION: The patient has full passive range of motion in all four extremities.   MOTOR:  Straight Leg Raise: Negative, bilateral                                             Hip Flex  Knee Ext  Knee Flex  Ankle DF  GTE  Ankle PF  Tone              Right  +4/5  +4/5  +4/5  +4/5  +4/5  +4/5  +4/5              Left  +4/5  +4/5  +4/5  +4/5  +4/5  +4/5  +4/5  ASSESSMENT    Diagnoses and all orders for this visit:      1. Lumbar post-laminectomy syndrome      2. Lumbar neuritis      3. Morbid obesity ([place])         IMPRESSION AND PLAN:   Patient returns to the office today with c/o progressive lower back pain radiating into the BLE (LLE>RLE) in a S1 distribution to the feet involving the digits. Multiple treatment options were discussed.  Patient wished to continue her current treatment. I provided her refills of Cymbalta 60 mg daily. Patient is neurologically intact. I will see  the patient back in 3 month's time or earlier if needed.         Written by Jonna Coup, [employer], as dictated by Candi Leash, MD   I examined the patient, reviewed and agree with the note.

## 2020-02-27 ENCOUNTER — Encounter

## 2020-03-13 ENCOUNTER — Inpatient Hospital Stay: Admit: 2020-03-13 | Payer: MEDICARE | Attending: Nurse Practitioner | Primary: Nurse Practitioner

## 2020-03-13 ENCOUNTER — Inpatient Hospital Stay: Payer: MEDICARE | Attending: Nurse Practitioner | Primary: Nurse Practitioner

## 2020-03-13 DIAGNOSIS — Z1211 Encounter for screening for malignant neoplasm of colon: Secondary | ICD-10-CM

## 2020-03-13 LAB — AMB POC CREATININE
Creatinine, POC: 0.8 MG/DL (ref 0.6–1.3)
GFR African American: >60 mL/min/{1.73_m2} (ref >60)
GFR Non-African American: >60 mL/min/{1.73_m2} (ref >60)

## 2020-03-13 LAB — CREATININE, POC
Creatinine, POC: 0.8 MG/DL (ref 0.6–1.3)
GFRAA, POC: >60 mL/min/{1.73_m2} (ref >60)
GFRNA, POC: >60 mL/min/{1.73_m2} (ref >60)

## 2020-03-13 MED ORDER — IOPAMIDOL 61 % IV SOLN
300 mg iodine /mL (61 %) | Freq: Once | INTRAVENOUS | Status: AC
Start: 2020-03-13 — End: 2020-03-13
  Administered 2020-03-13: 22:00:00 via INTRAVENOUS

## 2020-03-13 MED FILL — ISOVUE-300  61 % INTRAVENOUS SOLUTION: 300 mg iodine /mL (61 %) | INTRAVENOUS | Qty: 100

## 2020-03-13 NOTE — Telephone Encounter (Signed)
Clarification. No dx indicating need for chest xray. Please advise.

## 2020-03-13 NOTE — Telephone Encounter (Signed)
BS CT [employer] needs clarification on an order for patient. Pls call [phone]

## 2020-03-13 NOTE — Telephone Encounter (Signed)
LVM for [place] to return call.    Pts CT was ordered by GI, NP Pauline Good not Dr. Linton Rump.

## 2020-04-02 ENCOUNTER — Ambulatory Visit: Admit: 2020-04-02 | Discharge: 2020-04-02 | Payer: MEDICARE | Attending: Surgery | Primary: Nurse Practitioner

## 2020-04-02 ENCOUNTER — Ambulatory Visit: Attending: Surgery | Primary: Nurse Practitioner

## 2020-04-02 DIAGNOSIS — K430 Incisional hernia with obstruction, without gangrene: Secondary | ICD-10-CM

## 2020-04-02 NOTE — Progress Notes (Signed)
General Surgery Consult      Tiffany [name]  Admit date: (Not on file)    MRN: [id number]     DOB: [birth year]     Age: 50 y.o.        Attending Physician: Earlyne Iba, MD, FACS      History of Present Illness:     Tiffany [name] is a 50 y.o. female who was referred to me by Gaspar Bidding for evaluation of an incisional hernia.  The patient has a very complicated medical and surgical history including severe morbid obesity and she underwent a back surgery that was complicated by major intra-abdominal complication including vascular injury needing an exploratory laparotomy.  According to the patient she had an open abdomen and multiple laparotomies and a could not close her abdomen and she stated that she had her bowel outside for many weeks and it seems this was repaired later with a mesh placement and she was worried around that most likely she would develop a very large incisional hernia.  Unfortunately the patient did get morbidly obese and according to verbal she had lost about 30 to 50 pounds over the last year but her bulge has been increasing as well as her abdominal pain.  She stated that she had a CT scan in 2019 that showed very large incisional hernia but because she was very high risks no surgery was attempted.  She currently is having abdominal pain in the supraumbilical area and may be radiating to the left side.  This pain has been present for years but it seems it is getting slightly worse over the last year or so.  She underwent a new CT scan this year last month that showed a large incisional hernia with multiple bowel loops but no evidence of bowel obstruction.    Patient Active Problem List    Diagnosis Date Noted   ??? Urinary urgency 06/05/2019   ??? Chronic constipation 06/05/2019   ??? First degree heart block by electrocardiogram 04/23/2019   ??? Insomnia 07/25/2018   ??? Hypertriglyceridemia 06/01/2016   ??? Iron deficiency anemia 02/23/2016   ??? S/P IVC filter 09/08/2015   ??? Morbid obesity with  body mass index of 50.0-59.9 in adult [facility]) 08/08/2011   ??? Spinal stenosis of lumbar region 09/18/2006   ??? Intermittent palpitations 05/12/2006     Past Medical History:   Diagnosis Date   ??? Arthritis    ??? Biliary dyskinesia 08/08/2011   ??? Carpal tunnel syndrome 01/26/2005   ??? Chronic constipation 07/25/2018    Dr Donovan Kail   ??? 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 Chronic pain of right knee 07/25/2018     Dr Vinson Moselle, ortho   ??? Constipation due to opioid therapy 07/25/2018   ??? DVT (deep venous thrombosis) ([place]) 2017    Left groin; Dr Sallee Provencal, vascular, places IVF preop   ??? Dysphagia     Dr Luster Landsberg   ??? Esophageal obstruction     Dr Luster Landsberg   ??? Hemorrhagic shock ([place]) 09/17/2019   ??? History of fall 09/05/2018    see brief documentation on 09/05/2018   ??? 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 Insomnia 07/25/2018   ??? Migraine 02/11/2006    Formatting of this note might be different from the original. Updated invalid ICD 9 codes for ICD 10 go live   ??? Morbid obesity ([place]) 03/11/2016   ??? Spasm of  back muscles 07/25/2018   ??? 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 Tear of left rotator cuff 12/02/2019   ??? Urinary urgency 06/05/2019   ??? Ventral hernia without obstruction or gangrene 08/31/2016      Past Surgical History:   Procedure Laterality Date   ??? COLONOSCOPY N/A 10/12/2017    COLONOSCOPY performed by Alferd Patee, MD at [place]   ??? COLONOSCOPY N/A 10/12/2018    COLONOSCOPY performed by Alferd Patee, MD at [facility] ENDOSCOPY   ??? HAND/FINGER SURGERY UNLISTED Bilateral     CTR   ??? HX ADENOIDECTOMY     ??? HX CHOLECYSTECTOMY  2013   ??? HX COLONOSCOPY  1999    screening - neg results per patient   ??? HX ENDOSCOPY      screening - neg results per patient   ??? HX ENDOSCOPY  10/12/2018    Dr Judie Grieve, [facility]   ??? HX GYN      ectopic preg with tube rupture   ??? HX KNEE REPLACEMENT Right 12/2017     Dr Vinson Moselle, ortho   ??? HX LUMBAR FUSION  1610    R6-E4 - complicated by  vascular injury   ??? HX ORTHOPAEDIC      left foot surgery - stress fx   ??? HX ORTHOPAEDIC      L &R carpal tunnel   ??? HX TONSILLECTOMY     ??? IR IVC FILTER  2017    removed June 2017 noted in care everywhere   ??? PR ABDOMEN SURGERY PROC UNLISTED  2017    Open abdomen management (14 days) after vascular injury during lumbar fusion   ??? PR ANESTH,SURGERY OF SHOULDER  05/03/2019    left shoulder   ??? VASCULAR SURGERY PROCEDURE UNLIST      vascular repair of aortic/venocaval repair.       Social History     Tobacco Use   ??? Smoking status: Never Smoker   ??? Smokeless tobacco: Never Used   Substance Use Topics   ??? Alcohol use: No      Social History     Tobacco Use   Smoking Status Never Smoker   Smokeless Tobacco Never Used     Family History   Problem Relation Age of Onset   ??? Hypertension Mother    ??? Heart Disease Father    ??? Hypertension Father    ??? Heart Disease Paternal Aunt    ??? Heart Disease Paternal Uncle    ??? Heart Disease Paternal Grandmother       Current Outpatient Medications   Medication Sig   ??? cyclobenzaprine (AMRIX) 15 mg cp24 SR capsule Take 15 mg by mouth daily.   ??? DULoxetine (CYMBALTA) 60 mg capsule TAKE ONE CAPSULE BY MOUTH ONCE NIGHTLY   ??? baclofen (LIORESAL) 10 mg tablet TAKE ONE TABLET BY MOUTH THREE TIMES A DAY AS NEEDED FOR MUSCLE SPASMS OR FOR PAIN   ??? oxybutynin (DITROPAN) 5 mg tablet Take 1 Tablet by mouth two (2) times a day. Indications: a condition where the urge to urinate results in urine leakage   ??? traZODone (DESYREL) 150 mg tablet Take 1 Tablet by mouth nightly.     No current facility-administered medications for this visit.      Allergies   Allergen Reactions   ??? Propoxyphene N-Acetaminophen Hives     Other reaction(s): Unknown   ??? Propoxyphene Other (comments)     Reaction unknown   ???  Norco [Hydrocodone-Acetaminophen] Itching        Review of Systems:  Constitutional: negative  Eyes: negative  Ears, Nose, Mouth, Throat, and Face: negative  Respiratory: negative  Cardiovascular:  negative  Gastrointestinal: positive for constipation and abdominal pain  Genitourinary:negative  Integument/Breast: negative  Hematologic/Lymphatic: negative  Musculoskeletal:negative  Neurological: negative  Behavioral/Psychiatric: negative  Endocrine: negative  Allergic/Immunologic: negative    Objective:     Visit Vitals  BP 103/64 (BP 1 Location: Left lower arm, BP Patient Position: Sitting)   Pulse 74   Temp 97.5 ??F (36.4 ??C) (Oral)   Ht 5' 5"  (1.651 m)   Wt 143.3 kg (316 lb)   SpO2 97%   BMI 52.59 kg/m??       Physical Exam:      General:  in no apparent distress, alert, oriented times 3, afebrile and normal vitals   Eyes:  conjunctivae and sclerae normal, pupils equal, round, reactive to light   Throat & Neck: no erythema or exudates noted and neck supple and symmetrical; no palpable masses   Lungs:   clear to auscultation bilaterally   Heart:  Regular rate and rhythm   Abdomen:   rounded and obese, soft.  There is a very large midline scar with multiple other small scars consistent with her previous open abdomen and multiple laparotomies and multiple drain placements.  There is a very large bulge mainly toward the left side of the midline consistent with her very large incisional hernia seen on the CT scan.  It is very hard to assess the hernia because of her severe morbid obesity but the area is slightly tender and it seems that the hernia contents are nonreducible.    Extremities: extremities normal, atraumatic, no cyanosis or edema   Skin: Normal.       Imaging and Lab Review:     CBC:   Lab Results   Component Value Date/Time    WBC 6.4 04/19/2019 01:41 PM    RBC 4.22 04/19/2019 01:41 PM    HGB 12.2 04/19/2019 01:41 PM    HCT 37.4 04/19/2019 01:41 PM    PLATELET 336 04/19/2019 01:41 PM     BMP:   Lab Results   Component Value Date/Time    Glucose 105 (H) 04/19/2019 01:41 PM    Sodium 139 04/19/2019 01:41 PM    Potassium 4.0 04/19/2019 01:41 PM    Chloride 109 04/19/2019 01:41 PM    CO2 27 04/19/2019  01:41 PM    BUN 12 04/19/2019 01:41 PM    Creatinine 0.86 04/19/2019 01:41 PM    Calcium 9.0 04/19/2019 01:41 PM     CMP:  Lab Results   Component Value Date/Time    Glucose 105 (H) 04/19/2019 01:41 PM    Sodium 139 04/19/2019 01:41 PM    Potassium 4.0 04/19/2019 01:41 PM    Chloride 109 04/19/2019 01:41 PM    CO2 27 04/19/2019 01:41 PM    BUN 12 04/19/2019 01:41 PM    Creatinine 0.86 04/19/2019 01:41 PM    Calcium 9.0 04/19/2019 01:41 PM    Anion gap 3 04/19/2019 01:41 PM    BUN/Creatinine ratio 14 04/19/2019 01:41 PM    Alk. phosphatase 81 10/22/2009 08:00 PM    Protein, total 8.2 10/22/2009 08:00 PM    Albumin 3.7 10/22/2009 08:00 PM    Globulin 4.5 (H) 10/22/2009 08:00 PM    A-G Ratio 0.8 10/22/2009 08:00 PM       No results found for this  or any previous visit (from the past 24 hour(s)).    images and reports reviewed    Assessment:   Tiffany [name] is a 50 y.o. female who is presenting with a very challenging clinical scenario.  The patient has a history of major laparotomies secondary to a major complication from her back surgery needing an open abdomen with a mesh placement to be able to close the abdomen.  Now the patient has severe morbid obesity with a recurrent incisional hernia is incarcerated at seems causing some pain and discomfort.   I had a long discussion with the patient and I explained to her that with the current situation she is at extremely high risk of major complications whether from the anesthesia or the surgery itself and I do not believe that performing the surgery now is a good idea because of her morbid obesity and her previous significant medical conditions and her previous major laparotomies and mesh placement.  I believe that if we do the surgery now she will end up with either a complication from anesthesia, or a major complication like a bowel injury and fistula or recurrence of the hernia and mesh infection.  I explained to her that it is concerning that her abdominal pain is  slightly getting worse but still this is not an indication to do her surgery in her setting because of the higher risks of doing the surgery than the observation for now.  That being said I explained to her that she needs to lose weight not only to improve her pain and the chances of being able to do the surgery for her hernia but because of her general health reasons.  I explained to her that this should be her first target currently is to lose weight and I told her that if she lose about 50 to 60 pounds over the last 6 months like she did over the last year then we will be able to attempt to do robotic hernia repair but we will have to reassess again for sure.   I also explained to the patient that there is a risk of strangulation and bowel obstruction and also the risk of having to do surgery in case there is a major complications from the hernia.     Plan:     The patient is extremely high risk for surgery so this will not be offered  Lose weight  Follow-up in 6 months or as needed    Please call me if you have any questions (cell phone: [phone])     Signed By: Earlyne Iba, MD     April 02, 2020

## 2020-04-02 NOTE — Progress Notes (Signed)
 Tiffany [name] is a 50 y.o. female (DOB: 10/05/1970) presenting to address:    Chief Complaint   Patient presents with   . New Patient     Ventral hernis/ referred by Tiffany [name]. NP       Medication list and allergies have been reviewed with Tiffany [name] and updated as of today's date.     I have gone over all Medical, Surgical and Social History with Tiffany [name] and updated/added the information accordingly.

## 2020-04-09 ENCOUNTER — Ambulatory Visit
Admit: 2020-04-09 | Discharge: 2020-04-09 | Payer: MEDICARE | Attending: Orthopaedic Surgery | Primary: Nurse Practitioner

## 2020-04-09 ENCOUNTER — Ambulatory Visit: Attending: Orthopaedic Surgery | Primary: Nurse Practitioner

## 2020-04-09 DIAGNOSIS — M75102 Unspecified rotator cuff tear or rupture of left shoulder, not specified as traumatic: Secondary | ICD-10-CM

## 2020-04-09 NOTE — Progress Notes (Signed)
Progress Notes by Richardo Hanks, MD at 04/09/20 1120                Author: Richardo Hanks, MD  Service: --  Author Type: Physician       Filed: 04/09/20 1153  Encounter Date: 04/09/2020  Status: Signed          Editor: Richardo Hanks, MD (Physician)                          [name]   [birth year]       HISTORY OF PRESENT ILLNESS   Tiffany [name] is a 50 y.o.  female who presents today for evaluation s/p Left shoulder arthroscopic 1cm rotator cuff repair, subscapularis repair on 05/03/19. Describes pain as a 8/10. She is having significant night pain and constant  pain anteriorly. She has pain with overhead reaching and limited ROM. She has tried heat and ice with minimal relief. Has started to notice she is nursing her arm more. Still notes some issues with lifting the arm up. Has been taking NSAID and baclofen.  [facility]. Patient denies any fever, chills, chest pain, shortness of breath or calf pain. The remainder of the review of systems is negative. There are no new illness or injuries to report since last  seen in the office. No changes in medications, allergies, social or family history.           Pain Assessment   04/09/2020        Location of Pain  Shoulder     Location Modifiers  Left     Severity of Pain  8     Quality of Pain  Throbbing;Aching     Quality of Pain Comment  -     Duration of Pain  -     Frequency of Pain  Constant     Aggravating Factors  -     Aggravating Factors Comment  -     Limiting Behavior  -     Relieving Factors  -     Relieving Factors Comment  -     Result of Injury  -     Work-Related Injury  -     Type of Injury  -        Type of Injury Comment  -           PHYSICAL EXAM:    Visit Vitals      Pulse  85     Ht  5\' 5"  (1.651 m)     Wt  316 lb (143.3 kg)     SpO2  99%        BMI  52.59 kg/m??         The patient is a well-developed, well-nourished female  in no acute distress.  The patient is  alert and oriented times three. The patient appears to be well groomed. Mood and affect are normal.   LYMPHATIC: lymph nodes are not enlarged and are within normal limits   SKIN: normal in color and non tender to palpation. There are no bruises or abrasions noted.    NEUROLOGICAL: Motor sensory exam is within normal limits. Reflexes are equal bilaterally. There is normal sensation to pinprick and light touch   ORTHOPEDIC EXAM of left shoulder:        Examination  Left shoulder     Skin  Intact     AC joint tenderness  -     Biceps tenderness  -     Forward flexion/Elevation ROM  110     Active abduction ROM  110     Glenohumeral abduction  75     External rotation ROM  30     Internal rotation ROM  30     Apprehension  -     Jobes Relocation  -     Jerk  -     Load and Shift  -     Obriens  -     Speeds  -     Impingement sign  +     Supraspinatus/Empty Can  +     External Rotation Strength  -, 5/5     Lift Off/Belly Press  -, 5/5        Neurovascular  Intact           IMAGING: MRI arthrogram of left shoulder dated 07/04/2019 was reviewed and read by Dr. Domingo Pulse:    IMPRESSION:   Expected post surgical changes compatible with anterior rotator cuff tendon repair. No findings of re-tear.    Mild/moderate generalized rotator cuff tendinosis with areas of low-grade   partial thickness interstitial tearing and undersurface fraying most notably of the supraspinatus tendon. Musculature: No significant muscle edema. Mild global rotator cuff muscle atrophy            IMPRESSION:  S/P Left shoulder arthroscopic 1cm rotator cuff repair, subscapularis repair     Encounter Diagnosis        Name  Primary?         ?  Tear of left rotator cuff, unspecified tear extent, unspecified whether traumatic  Yes              PLAN:    1. Pt presents s/p Left shoulder arthroscopic 1cm rotator cuff repair, subscapularis repair on 05/03/19. I would like to order a MRI to assess scar tissue and r/o a retear. Return following STAT MRI    RIsk  factors include: BMI>50   2. No ultrasound exam indicated today   3. No cortisone injection indicated today    4. No Physical/Occupational Therapy indicated today   5. Yes diagnostic test indicated today: STAT MRI LS    6. No durable medical equipment indicated today   7. No referral indicated today    8. No medications indicated today:    9. No Narcotic indicated today          RTC following MRI           Scribed by Oletta Darter [facility]) as dictated by Richardo Hanks, MD      I, Dr. Richardo Hanks, confirm that all documentation is accurate.      Richardo Hanks, M.D.    [facility]

## 2020-04-27 ENCOUNTER — Ambulatory Visit
Admit: 2020-04-27 | Discharge: 2020-04-27 | Payer: MEDICARE | Attending: Nurse Practitioner | Primary: Nurse Practitioner

## 2020-04-27 ENCOUNTER — Ambulatory Visit: Attending: Nurse Practitioner | Primary: Nurse Practitioner

## 2020-04-27 DIAGNOSIS — G4700 Insomnia, unspecified: Secondary | ICD-10-CM

## 2020-04-27 MED ORDER — OXYBUTYNIN CHLORIDE 5 MG TAB
5 mg | ORAL_TABLET | Freq: Two times a day (BID) | ORAL | 1 refills | Status: DC
Start: 2020-04-27 — End: 2020-10-06

## 2020-04-27 MED ORDER — CITALOPRAM 20 MG TAB
20 mg | ORAL_TABLET | Freq: Every day | ORAL | 1 refills | Status: DC
Start: 2020-04-27 — End: 2020-08-03

## 2020-04-27 MED ORDER — DOXEPIN 3 MG TABLET
3 mg | ORAL_TABLET | Freq: Every evening | ORAL | 1 refills | Status: DC
Start: 2020-04-27 — End: 2020-05-07

## 2020-04-27 NOTE — Progress Notes (Signed)
Chief Complaint   Patient presents with   . Leg Pain     pt c/o left leg pain x 1 month   . Abdominal Pain     pt c/o left, tender abd pain x 1 month   . Nausea     x 1 month     1. "Have you been to the ER, urgent care clinic since your last visit?  Hospitalized since your last visit?" ER this month for abd pain    2. "Have you seen or consulted any other health care providers outside of the [facility] since your last visit?" No     3. For patients aged 78-75: Has the patient had a colonoscopy / FIT/ Cologuard? Yes - no Care Gap present      If the patient is female:    4. For patients aged 87-74: Has the patient had a mammogram within the past 2 years? Yes - no Care Gap present      5. For patients aged 21-65: Has the patient had a pap smear? No

## 2020-04-27 NOTE — Progress Notes (Signed)
[facility]  [name]  [address]  [place], [place] [address]  [phone] fax    04/27/2020    HPI:   Tiffany [name] October 08, 1970 is a pleasant BLACK/AFR[place] AMER[place] female.  Pt lives with spouse , has no children, and is unemployed/disabled.    Past Medical History:   Diagnosis Date   ??? Arthritis    ??? Biliary dyskinesia 08/08/2011   ??? Carpal tunnel syndrome 01/26/2005   ??? Chronic constipation 07/25/2018    Dr Lawson Fiscal   ??? Chronic low back pain with sciatica 07/25/2018    Dr Ammie Ferrier   ??? Chronic pain of right knee 07/25/2018     Dr Maryann Conners, ortho   ??? Constipation due to opioid therapy 07/25/2018   ??? DVT (deep venous thrombosis) (HCC) 2017    Left groin; Dr Mila Palmer, vascular, places IVF preop   ??? Dysphagia     Dr Laymond Purser   ??? Esophageal obstruction     Dr Laymond Purser   ??? Hemorrhagic shock (HCC) 09/17/2019   ??? History of fall 09/05/2018    see brief documentation on 09/05/2018   ??? 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 Insomnia 07/25/2018   ??? Migraine 02/11/2006    Formatting of this note might be different from the original. Updated invalid ICD 9 codes for ICD 10 go live   ??? Morbid obesity (HCC) 03/11/2016   ??? Spasm of back muscles 07/25/2018   ??? Status post right knee replacement 07/25/2018     Dr Maryann Conners, ortho   ??? Tear of left rotator cuff 12/02/2019   ??? Urinary urgency 06/05/2019   ??? Ventral hernia without obstruction or gangrene 08/31/2016     Past Surgical History:   Procedure Laterality Date   ??? COLONOSCOPY N/A 10/12/2017    COLONOSCOPY performed by Eustace Moore, MD at [facility] ENDOSCOPY   ??? COLONOSCOPY N/A 10/12/2018    COLONOSCOPY performed by Eustace Moore, MD at [facility] ENDOSCOPY   ??? HAND/FINGER SURGERY UNLISTED Bilateral     CTR   ??? HX ADENOIDECTOMY     ??? HX CHOLECYSTECTOMY  2013   ??? HX COLONOSCOPY  1999    screening - neg results per patient   ??? HX ENDOSCOPY      screening - neg results per patient   ??? HX ENDOSCOPY  10/12/2018    Dr Rozell Searing, [facility]   ??? HX GYN      ectopic  preg with tube rupture   ??? HX KNEE REPLACEMENT Right 12/2017     Dr Maryann Conners, ortho   ??? HX LUMBAR FUSION  2017    L5-S1 - complicated by vascular injury   ??? HX ORTHOPAEDIC      left foot surgery - stress fx   ??? HX ORTHOPAEDIC      L &R carpal tunnel   ??? HX TONSILLECTOMY     ??? IR IVC FILTER  2017    removed June 2017 noted in care everywhere   ??? PR ABDOMEN SURGERY PROC UNLISTED  2017    Open abdomen management (14 days) after vascular injury during lumbar fusion   ??? PR ANESTH,SURGERY OF SHOULDER  05/03/2019    left shoulder   ??? VASCULAR SURGERY PROCEDURE UNLIST      vascular repair of aortic/venocaval repair.      Current Outpatient Medications   Medication Sig   ??? MULTIVITAMIN PO Take  by mouth.   ??? oxybutynin (DITROPAN) 5 mg  tablet Take 1 Tablet by mouth two (2) times a day. Indications: a condition where the urge to urinate results in urine leakage   ??? citalopram (CELEXA) 20 mg tablet Take 1 Tablet by mouth daily.   ??? Doxepin 3 mg tab Take 3 mg by mouth nightly. Take 30 minutes prior to bedtime. Avoid food within 3 hours of taking medication.  Indications: difficulty sleeping   ??? cyclobenzaprine (AMRIX) 15 mg cp24 SR capsule Take 15 mg by mouth daily.   ??? DULoxetine (CYMBALTA) 60 mg capsule TAKE ONE CAPSULE BY MOUTH ONCE NIGHTLY   ??? baclofen (LIORESAL) 10 mg tablet TAKE ONE TABLET BY MOUTH THREE TIMES A DAY AS NEEDED FOR MUSCLE SPASMS OR FOR PAIN     No current facility-administered medications for this visit.     Allergies and Intolerances:   Allergies   Allergen Reactions   ??? Propoxyphene N-Acetaminophen Hives     Other reaction(s): Unknown   ??? Propoxyphene Other (comments)     Reaction unknown   ??? Norco [Hydrocodone-Acetaminophen] Itching     Family History:   Family History   Problem Relation Age of Onset   ??? Hypertension Mother    ??? Heart Disease Father    ??? Hypertension Father    ??? Heart Disease Paternal Aunt    ??? Heart Disease Paternal Uncle    ??? Heart Disease Paternal Grandmother      Social History:    She  reports that she has never smoked. She has never used smokeless tobacco.   Social History     Substance and Sexual Activity   Alcohol Use No     Immunization History:    There is no immunization history on file for this patient.    Todays concern:  1. Insomnia: Starting back in June 2020, pt was getting 2.5-3 hours sleep at night. She used to take [place] and trazodone in the past-not effective. Seeking alternative.   2. Chronic left shoulder pain: Back July 2020 she fell in the hallway in her home while being chased by an aggressive Rottweiler that a neighbor had given her.  She landed on her left shoulder.   She follows up with Dr. Domingo Pulse as scheduled. MRI scheduled for 04/29/20.   3. Achy pain (left thigh). x1 month. 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 No swelling, no circulatory issues. Scheduled with Dr Dorthula Perfect who placed the artificial vein.   4. ABD hernia. Stool backsup into the herniated area causing discomfort. Dr Judie Grieve, GI, referred pt to general surgery. Saw Dr Forest Gleason in hopes he would correct the hernia. He informed her the surgery was to risky and he would not be able to perform the procedure. She has an appt with the original surgeon, performed surgery 2017, on 05/14/2020 to discuss options.         Chronic constipation: Sees Dr Judie Grieve. Had attempted colonoscopy in 2019 but unable to complete due to presence of stool.     Chronic back pain: sees Dr Vella Kohler, ortho as scheduled.  No longer seeing Dr Arma Heading and no longer taking tylenol with codeine since she is being treated by Dr. Domingo Pulse for her left shoulder pain. Pain is in lower back and down both legs.  Dr Vella Kohler prescribes Cymbalta therapy and pt takes aleve as needed.    Muscle spasms: She continues to suffer from spasms to her lower back. Flexeril effective.     Status post right knee replacement: She had RTK replacement by Dr Vinson Moselle, ortho, Oct  2019.     Urinary urgency: Pt was prescribed Myrbetriq 25mg  in the past for bladder control by the  [place] [facility] urologist but due to cost she was unable to start treatment.  She continues Ditropan and tolerates well.    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 swelling goes down at night with elevation. Follow up with Dr Dorthula Perfect, vascular, who places IVF preop due to hx of DVTs. Jan 2021 started wearing compression hose to both LEs. Helps with reducing swelling during the day.       Review of Systems:   As above included in HPI.  Otherwise 11 point review of systems negative including constitutional, skin, HENT, eyes, respiratory, cardiovascular, gastrointestinal, genitourinary, musculoskeletal, endocrine, hematologic, allergy, and neurologic.      Physical:   Visit Vitals  BP 123/65   Pulse 84   Temp 97.3 ??F (36.3 ??C) (Temporal)   Resp 16   Ht 5\' 5"  (1.651 m)   Wt 314 lb 9.6 oz (142.7 kg)   SpO2 100%   BMI 52.35 kg/m??      Wt Readings from Last 3 Encounters:   04/27/20 314 lb 9.6 oz (142.7 kg)   04/09/20 316 lb (143.3 kg)   04/02/20 316 lb (143.3 kg)         Exam:   Physical Exam  Constitutional:       Appearance: Normal appearance. She is obese.      Comments: Morbidly obese   HENT:      Head: Normocephalic and atraumatic.      Right Ear: External ear normal.      Left Ear: External ear normal.   Eyes:      Extraocular Movements: Extraocular movements intact.      Conjunctiva/sclera: Conjunctivae normal.   Neck:      Vascular: No carotid bruit.   Cardiovascular:      Rate and Rhythm: Normal rate and regular rhythm.      Pulses: Normal pulses.      Heart sounds: Normal heart sounds.      Comments: No edema noted to bil LEs.  Pulmonary:      Effort: Pulmonary effort is normal. No respiratory distress.      Breath sounds: Normal breath sounds. No wheezing.   Abdominal:      General: Abdomen is flat. Bowel sounds are normal. There is no distension.      Palpations: Abdomen is soft.      Tenderness: There is no abdominal tenderness.   Musculoskeletal:         General: Normal  range of motion.      Cervical back: Normal range of motion.      Comments: Gait stable. No amb device needed.    Skin:     General: Skin is warm and dry.   Neurological:      General: No focal deficit present.      Mental Status: She is alert and oriented to person, place, and time.   Psychiatric:         Mood and Affect: Mood normal.         Behavior: Behavior normal.      Comments: Tearful during visit today        Body mass index is 52.35 kg/m??.     Review of Data:  N/A      Impression:  Patient Active Problem List   Diagnosis Code   ??? Morbid obesity  with body mass index of 50.0-59.9 in adult (HCC) E66.01, Z68.43   ??? Insomnia G47.00   ??? Spinal stenosis of lumbar region M48.061   ??? First degree heart block by electrocardiogram I44.0   ??? Urinary urgency R39.15   ??? Chronic constipation K59.09   ??? Hypertriglyceridemia E78.1   ??? Iron deficiency anemia D50.9   ??? Intermittent palpitations R00.2   ??? S/P IVC filter [id number]       Plan:    ICD-10-CM ICD-9-CM    1. Insomnia, unspecified type  G47.00 780.52 Doxepin 3 mg tab   2. Anxiety  F41.9 300.00 citalopram (CELEXA) 20 mg tablet   3. Urinary urgency  R39.15 788.63 oxybutynin (DITROPAN) 5 mg tablet   4. Tear of left rotator cuff, unspecified tear extent, unspecified whether traumatic  M75.102 840.4    5. Morbid obesity (HCC)  E66.01 278.01 METABOLIC PANEL, COMPREHENSIVE   6. Elevated glucose level  R73.09 790.29 METABOLIC PANEL, COMPREHENSIVE      HEMOGLOBIN A1C WITH EAG     -Insomnia, unspecified type  Discussed multiple options. I dont feel benzodiazepines are appropriate.  Discontinue Trazodone as this is not effective  Initiate Doxepin 3mg  qpm. She may increase to 2 tabs if needed.   Instructed pt on the importance of going to bed the same time every night and getting up the same time every morning, even on the weekends. Instructed pt to try and relax, cut TV off in the room, avoid caffeine and/or heavy foods a few hours before going to bed. Pt to avoid daytime  napping. Patient verbalized understanding.    -Anxiety  Initiate Celexa 20mg . After 10-14 days if sx not improved, she may increase to 40mg .  Recommended counseling to assist with coping. Long hx of chronic pain d/t abd hernia and other complications.     -Urinary urgency  Continue oxybutynin as prescribed    -Tear left rotator cuff  Scheduled for MRI 04/29/20  Continue cymbalta and amrix as prescribed  Follow up with Dr    -Chronic low back pain with sciatica, sciatica laterality unspecified, unspecified back pain laterality  In 2017 in [place], [place], pt underwent back surgery for a herniated disc where she experienced vascular complications. She was in ICU critical care x14 days on a vent for recovery. She developed a DVT in left groin. Since then she has undergone 8 additional surgeries related to the vascular complication.   She is no longer in pain management due to receiving pain therapy via Dr. 2018 for left shoulder pain    IVF filter must be placed by Dr Waterford when patient needs to have a surgical procedure.    Dr Georgiana, ortho, prescribes cymbalta therapy.      Follow up Oct AWV with labs (A1c, CMP)      Dr. Simon Rhein, AGNP-C, DNP  [facility]       The total time 36 minutes.  At least 50% of that time was spent in counseling and/or coordination of care. My summary of patient counseling and coordination of care includes reviewing medical record, assessing patient, placing orders, and discussing plan of care with patient. Prior to seeing this patient, I reviewed records, including previously completed appointments/records, reviewed specialty records in connect care, and updated visits from other providers since I saw the patient last.

## 2020-04-29 ENCOUNTER — Inpatient Hospital Stay: Admit: 2020-04-29 | Payer: MEDICARE | Attending: Orthopaedic Surgery | Primary: Nurse Practitioner

## 2020-04-29 DIAGNOSIS — M75102 Unspecified rotator cuff tear or rupture of left shoulder, not specified as traumatic: Secondary | ICD-10-CM

## 2020-05-04 NOTE — Telephone Encounter (Signed)
Telephone Encounter by Levora Angel, LPN at 16/[birth year]96 [phone]                Author: Levora Angel, LPN  Service: --  Author Type: Licensed Nurse       Filed: 05/04/20 0833  Encounter Date: 05/03/2020  Status: Signed          Editor: Levora Angel, LPN (Licensed Nurse)          From: Sueanne MargaritaBettina Gavia, MDSent: 05/03/2020  8:14 PM EDTSubject: Question regarding MRI SHOULDER LT W/O CONTCan you please  explain in non phician talk

## 2020-05-05 ENCOUNTER — Encounter

## 2020-05-05 MED ORDER — BACLOFEN 10 MG TAB
10 mg | ORAL_TABLET | ORAL | 11 refills | Status: AC
Start: 2020-05-05 — End: ?

## 2020-05-05 MED ORDER — CELECOXIB 200 MG CAP
200 mg | ORAL_CAPSULE | ORAL | 11 refills | Status: DC
Start: 2020-05-05 — End: 2021-03-04

## 2020-05-05 NOTE — Telephone Encounter (Signed)
Requested Prescriptions     Refused Prescriptions Disp Refills   ??? traZODone (DESYREL) 150 mg tablet [Pharmacy Med Name: TRAZODONE 150 MG TABLET] 30 Tablet 11     Sig: Take 1 tablet by mouth every night     Refused By: Alla Feeling     Reason for Refusal: Medication Discontinued

## 2020-05-05 NOTE — Telephone Encounter (Signed)
Prior authorization submitted awaiting approval.

## 2020-05-05 NOTE — Telephone Encounter (Signed)
Pt calling, says AN gave her a new medication to help her sleep at recent visit. She doesn't know the name since she hasn't been able to pick it up yet. Says the pharmacy told her they are waiting on a prior auth.     Please call her and advise the status of the prior auth.

## 2020-05-06 ENCOUNTER — Encounter: Attending: Specialist | Primary: Nurse Practitioner

## 2020-05-06 ENCOUNTER — Encounter: Attending: Cardiovascular Disease | Primary: Nurse Practitioner

## 2020-05-06 NOTE — Telephone Encounter (Signed)
Which medication are you speaking of? Doxepin?

## 2020-05-06 NOTE — Telephone Encounter (Signed)
Prior Auth denied, pt has had to try other alternative listed on document placed on desk or a specific reasoning from Dr. why pt needs to take this. Please advise.

## 2020-05-07 ENCOUNTER — Encounter

## 2020-05-07 MED ORDER — SUVOREXANT 10 MG TABLET
10 mg | ORAL_TABLET | ORAL | 1 refills | Status: DC
Start: 2020-05-07 — End: 2020-05-07

## 2020-05-07 MED ORDER — SUVOREXANT 10 MG TABLET
10 mg | ORAL_TABLET | ORAL | 1 refills | Status: DC
Start: 2020-05-07 — End: 2020-07-27

## 2020-05-07 NOTE — Progress Notes (Signed)
Insurance declined doxepin but authorized belsomra for insomnia.       ICD-10-CM ICD-9-CM    1. Primary insomnia  F51.01 307.42 suvorexant (BELSOMRA) 10 mg tablet      DISCONTINUED: suvorexant (BELSOMRA) 10 mg tablet     ]

## 2020-05-07 NOTE — Telephone Encounter (Signed)
Sent in new med

## 2020-05-07 NOTE — Telephone Encounter (Signed)
Patient reached and made aware of new rx sent into pharmacy.

## 2020-05-11 NOTE — Telephone Encounter (Signed)
An appointment will be scheduled for patient.

## 2020-05-11 NOTE — Telephone Encounter (Signed)
Patient called back and was scheduled for MRI follow up appointment on 4/5.

## 2020-05-11 NOTE — Telephone Encounter (Signed)
She will need an apt to discuss this

## 2020-05-12 ENCOUNTER — Ambulatory Visit
Admit: 2020-05-12 | Discharge: 2020-05-12 | Payer: MEDICARE | Attending: Physician Assistant | Primary: Nurse Practitioner

## 2020-05-12 ENCOUNTER — Ambulatory Visit: Attending: Physician Assistant | Primary: Nurse Practitioner

## 2020-05-12 DIAGNOSIS — M75102 Unspecified rotator cuff tear or rupture of left shoulder, not specified as traumatic: Secondary | ICD-10-CM

## 2020-05-12 MED ORDER — TRIAMCINOLONE ACETONIDE 40 MG/ML SUSP FOR INJECTION
40 mg/mL | Freq: Once | INTRAMUSCULAR | Status: AC
Start: 2020-05-12 — End: 2020-05-12
  Administered 2020-05-12: 16:00:00 via INTRABURSAL

## 2020-05-12 NOTE — Progress Notes (Signed)
Progress Notes by Rogelia Boga, PA at 05/12/20 1115                Author: Rogelia Boga, PA  Service: --  Author Type: Physician Assistant       Filed: 05/12/20 1343  Encounter Date: 05/12/2020  Status: Signed          Editor: Rogelia Boga, PA (Physician Assistant)                          Tiffany [name]   Jun 29, 1970       HISTORY OF PRESENT ILLNESS   Tiffany [name] is a 50 y.o.  female who presents today for evaluation s/p Left shoulder arthroscopic 1cm rotator cuff repair, subscapularis repair on 05/03/19. Describes pain as a 7/10. She is having significant night pain and constant  pain anteriorly. She has pain with overhead reaching and limited ROM. She has tried heat and ice with minimal relief. Has started to notice she is nursing her arm more. Still notes some issues with lifting the arm up. Has been taking NSAID and baclofen.  [facility]. Patient denies any fever, chills, chest pain, shortness of breath or calf pain. The remainder of the review of systems is negative. There are no new illness or injuries to report since last  seen in the office. No changes in medications, allergies, social or family history.           Pain Assessment   05/12/2020        Location of Pain  Shoulder     Location Modifiers  Left     Severity of Pain  7     Quality of Pain  Aching;Throbbing     Quality of Pain Comment  -     Duration of Pain  Persistent     Frequency of Pain  Constant     Aggravating Factors  Stretching     Aggravating Factors Comment  -     Limiting Behavior  Yes     Relieving Factors  Nothing     Relieving Factors Comment  -     Result of Injury  -     Work-Related Injury  -     Type of Injury  -        Type of Injury Comment  -           PHYSICAL EXAM:    Visit Vitals      Pulse  78     Temp  97.7 ??F (36.5 ??C) (Temporal)        SpO2  97%         The patient is a well-developed, well-nourished female  in no acute distress.  The patient is alert  and oriented times three. The patient appears to be well groomed. Mood and affect are normal.   LYMPHATIC: lymph nodes are not enlarged and are within normal limits   SKIN: normal in color and non tender to palpation. There are no bruises or abrasions noted.    NEUROLOGICAL: Motor sensory exam is within normal limits. Reflexes are equal bilaterally. There is normal sensation to pinprick and light touch   ORTHOPEDIC EXAM of left shoulder:        Examination  Left shoulder     Skin  Intact     AC joint tenderness  -  Biceps tenderness  -     Forward flexion/Elevation ROM  110     Active abduction ROM  110     Glenohumeral abduction  80     External rotation ROM  30     Internal rotation ROM  30     Apprehension  -     Jobes Relocation  -     Jerk  -     Load and Shift  -     Obriens  -     Speeds  -     Impingement sign  +     Supraspinatus/Empty Can  +     External Rotation Strength  -, 5/5     Lift Off/Belly Press  -, 5/5        Neurovascular  Intact           IMAGING:    ??MRI of left shoulder read and reviewed by myself reveal/:    1. Intact rotator cuff repair as outlined above   ??   2. Low-grade inflammation at the [facility] joint and pseudoarthrosis of os acromiale,   accompanied by minimal subacromial subdeltoid bursitis   ??   MRI arthrogram of left shoulder dated 07/04/2019 was reviewed and read by Dr. Domingo Pulse:    IMPRESSION:   Expected post surgical changes compatible with anterior rotator cuff tendon repair. No findings of re-tear.    Mild/moderate generalized rotator cuff tendinosis with areas of low-grade   partial thickness interstitial tearing and undersurface fraying most notably of the supraspinatus tendon. Musculature: No significant muscle edema. Mild global rotator cuff muscle atrophy      PROCEDURE: Left shoulder Injection with Ultrasound Guidance      Indication:Left Shoulder  pain/swelling      After sterile prep, 6 cc of Xylocaine and 1 cc of Kenalog were injected into the left  Shoulder.  Intra-bursal Ultrasound images captured using [facility]ound machine  using a frequency of 10 MHz with a linear transducer and scanned into patient's chart.       [facility]   OFFICE PROCEDURE PROGRESS NOTE            Chart reviewed for the following:   I, Rogelia Boga, PA, have reviewed the History, Physical and updated the Allergic reactions  for Tiffany [name] performed immediately prior to start of procedure:   I, Rogelia Boga, PA-C, have performed the following reviews on  Tiffany [name] prior to the start of the procedure:              * Patient was identified by name and date of birth    * Agreement on procedure being performed was verified   * Risks and Benefits explained to the patient   * Procedure site verified and marked as necessary   * Patient was positioned for comfort   * Consent was signed and verified       Time: 11:41 AM      Date of procedure: 05/12/2020      Procedure performed by:  Rogelia Boga, PA      Provider assisted by: (see medication administration)      How tolerated by patient: tolerated the procedure well with no complications      Comments: none         IMPRESSION:  S/P Left shoulder arthroscopic 1cm rotator cuff repair, subscapularis repair  Encounter Diagnoses        Name  Primary?         ?  Tear of left rotator cuff, unspecified tear extent, unspecified whether traumatic  Yes         ?  Subacromial bursitis of left shoulder joint                PLAN:    1. Pt presents s/p Left shoulder arthroscopic 1cm rotator cuff repair, subscapularis repair on 05/03/19.  After MRI review there is no evidence of retear.  There is inflammation consistent with bursitis.  We will inject the shoulder today with cortisone.   Discussed sending patient back to physical therapy.  Due to financial concerns patient would like to try this on her own.  She will continue with a physician directed exercise program at home.  If no  relief from her discomfort then we will return to  physical therapy   RIsk factors include: BMI>50   2. No ultrasound exam indicated today   3. Yes cortisone injection indicated today    4. Yes Physical/Occupational Therapy indicated today   5. Yes diagnostic test indicated today:    6. No durable medical equipment indicated today   7. No referral indicated today    8. No medications indicated today:    9. No Narcotic indicated today          RTC 3-4 weeks       Rogelia Boga, PA-C   [facility]

## 2020-05-18 ENCOUNTER — Ambulatory Visit: Payer: MEDICARE | Attending: Physical Medicine & Rehabilitation | Primary: Nurse Practitioner

## 2020-05-27 ENCOUNTER — Encounter: Payer: MEDICARE | Attending: Specialist | Primary: Nurse Practitioner

## 2020-05-27 ENCOUNTER — Telehealth
Admit: 2020-05-27 | Discharge: 2020-05-27 | Payer: MEDICARE | Attending: Nurse Practitioner | Primary: Nurse Practitioner

## 2020-05-27 ENCOUNTER — Encounter

## 2020-05-27 ENCOUNTER — Telehealth: Attending: Nurse Practitioner | Primary: Nurse Practitioner

## 2020-05-27 DIAGNOSIS — N939 Abnormal uterine and vaginal bleeding, unspecified: Secondary | ICD-10-CM

## 2020-05-27 MED ORDER — MELATONIN 10 MG SUBLINGUAL TABLET
10 mg | ORAL_TABLET | SUBLINGUAL | 0 refills | Status: DC
Start: 2020-05-27 — End: 2020-08-03

## 2020-05-27 NOTE — Telephone Encounter (Signed)
-----   Message from Barbie Banner sent at 05/27/2020  4:00 PM EDT -----  Subject: Message to Provider    QUESTIONS  Information for Provider? patient called in regards to her pelvic   ultrasound,. she states that her appointment isn't until next Thursday.   Her concern is her pain spreading to her left abdominal section and did   the doctor want her to wait that long ?  ---------------------------------------------------------------------------  --------------  CALL BACK INFO  What is the best way for the office to contact you? OK to leave message on   voicemail  Preferred Call Back Phone Number? [phone]  ---------------------------------------------------------------------------  --------------  SCRIPT ANSWERS  Relationship to Patient? Self

## 2020-05-27 NOTE — Progress Notes (Signed)
Chief Complaint   Patient presents with   . Vaginal Bleeding     no menstrual for 5 years, vaginal bleeding starting friday, abd cramps      1. "Have you been to the ER, urgent care clinic since your last visit?  Hospitalized since your last visit?" No    2. "Have you seen or consulted any other health care providers outside of the [facility] since your last visit?" No     3. For patients aged 51-75: Has the patient had a colonoscopy / FIT/ Cologuard? Yes - no Care Gap present      If the patient is female:    4. For patients aged 17-74: Has the patient had a mammogram within the past 2 years? No ORDERED 12/03/2019      5. For patients aged 21-65: Has the patient had a pap smear? Yes - no Care Gap present

## 2020-05-27 NOTE — Progress Notes (Signed)
[facility]  [place], [address]  [phone] fax      05/27/2020      HPI:   Tiffany [name] 05/20/1970 is a pleasant BLACK/AFRICAN AMERICAN female who presents today by phone for a sick appt.     UNABLE TO OBTAIN INTERNET CONNECTION TO COMPLETE VIRTUAL VISIT    Past Medical History:   Diagnosis Date   ??? Arthritis    ??? Biliary dyskinesia 08/08/2011   ??? Carpal tunnel syndrome 01/26/2005   ??? Chronic constipation 07/25/2018    Dr Donovan Kail   ??? 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 Chronic pain of right knee 07/25/2018     Dr Vinson Moselle, ortho   ??? Constipation due to opioid therapy 07/25/2018   ??? DVT (deep venous thrombosis) ([place]) 2017    Left groin; Dr Sallee Provencal, vascular, places IVF preop   ??? Dysphagia     Dr Luster Landsberg   ??? Esophageal obstruction     Dr Luster Landsberg   ??? Hemorrhagic shock ([place]) 09/17/2019   ??? History of fall 09/05/2018    see brief documentation on 09/05/2018   ??? 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 Insomnia 07/25/2018   ??? Migraine 02/11/2006    Formatting of this note might be different from the original. Updated invalid ICD 9 codes for ICD 10 go live   ??? Morbid obesity ([place]) 03/11/2016   ??? Spasm of back muscles 07/25/2018   ??? 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 Tear of left rotator cuff 12/02/2019   ??? Urinary urgency 06/05/2019   ??? Ventral hernia without obstruction or gangrene 08/31/2016     Past Surgical History:   Procedure Laterality Date   ??? COLONOSCOPY N/A 10/12/2017    COLONOSCOPY performed by Alferd Patee, MD at [place]   ??? COLONOSCOPY N/A 10/12/2018    COLONOSCOPY performed by Alferd Patee, MD at [facility] END[facility]COPY   ??? HAND/FINGER SURGERY UNLISTED Bilateral     CTR   ??? HX ADENOIDECTOMY     ??? HX CHOLECYSTECTOMY  2013   ??? HX COLONOSCOPY  1999    screening - neg results per patient   ??? HX ENDOSCOPY      screening - neg results per patient   ??? HX ENDOSCOPY   10/12/2018    Dr Judie Grieve, [facility]   ??? HX GYN      ectopic preg with tube rupture   ??? HX KNEE REPLACEMENT Right 12/2017     Dr Vinson Moselle, ortho   ??? HX LUMBAR FUSION  3295    J8-A4 - complicated by vascular injury   ??? HX ORTHOPAEDIC      left foot surgery - stress fx   ??? HX ORTHOPAEDIC      L &R carpal tunnel   ??? HX TONSILLECTOMY     ??? IR IVC FILTER  2017    removed June 2017 noted in care everywhere   ??? PR ABDOMEN SURGERY PROC UNLISTED  2017    Open abdomen management (14 days) after vascular injury during lumbar fusion   ??? PR ANESTH,SURGERY OF SHOULDER  05/03/2019    left shoulder   ??? VASCULAR SURGERY PROCEDURE UNLIST      vascular repair of aortic/venocaval repair.      Current Outpatient Medications   Medication Sig   ???  suvorexant (BELSOMRA) 10 mg tablet Take 1 tab by mouth 30 minutes before bedtime and at least 7 hours before planned time of awakening.  Indications: difficulty sleeping   ??? celecoxib (CELEBREX) 200 mg capsule Take 1 capsule by mouth twice daily   ??? baclofen (LIORESAL) 10 mg tablet Take 1 tablet by mouth three times daily for muscle spasms and pain   ??? MULTIVITAMIN PO Take  by mouth.   ??? oxybutynin (DITROPAN) 5 mg tablet Take 1 Tablet by mouth two (2) times a day. Indications: a condition where the urge to urinate results in urine leakage   ??? citalopram (CELEXA) 20 mg tablet Take 1 Tablet by mouth daily.   ??? cyclobenzaprine (AMRIX) 15 mg cp24 SR capsule Take 15 mg by mouth daily.   ??? DULoxetine (CYMBALTA) 60 mg capsule TAKE ONE CAPSULE BY MOUTH ONCE NIGHTLY     No current facility-administered medications for this visit.     Allergies and Intolerances:   Allergies   Allergen Reactions   ??? Propoxyphene N-Acetaminophen Hives     Other reaction(s): Unknown   ??? Propoxyphene Other (comments)     Reaction unknown   ??? Norco [Hydrocodone-Acetaminophen] Itching     Family History:   Family History   Problem Relation Age of Onset   ??? Hypertension Mother    ??? Heart Disease Father    ??? Hypertension Father    ???  Heart Disease Paternal Aunt    ??? Heart Disease Paternal Uncle    ??? Heart Disease Paternal Grandmother      Social History:   She  reports that she has never smoked. She has never used smokeless tobacco.   Social History     Substance and Sexual Activity   Alcohol Use No     Immunization History:    There is no immunization history on file for this patient.    Todays concern:  1. Vaginal bleeding.  No menses for the past 5 years until 05/22/2020 when she started a cycle.  She is utilizing 15 max pads per day??? 3 pads at a time.  3 days ago she noted blood clots.  She continues to bleed heavily.  She of abdominal pressure.  In February she was told she had uterine fibroids (?).  She denies nausea, vomiting, diarrhea, abdominal pain, fever, decreased appetite.    2.  Insomnia.  Taking suvorexant 10 mg 30 minutes before bedtime (approximately 9:30 PM).  She is able to fall asleep but she wakes up around 2 AM.      Review of Systems:   As above included in HPI.  Otherwise 11 point review of systems negative including constitutional, skin, HENT, eyes, respiratory, cardiovascular, gastrointestinal, genitourinary, musculoskeletal, endocrine, hematologic, allergy, and neurologic.    Physical:   Exam:   Vital Signs: (As obtained by patient/caregiver at home)  There were not vitals taken for this visit.     PHYSICAL EXAMINATION:  Unable to perform due to phone visit only      Impression:  Patient Active Problem List   Diagnosis Code   ??? Morbid obesity with body mass index of 50.0-59.9 in adult ([place]) E66.01, Z68.43   ??? Insomnia G47.00   ??? Spinal stenosis of lumbar region M48.061   ??? First degree heart block by electrocardiogram I44.0   ??? Urinary urgency R39.15   ??? Chronic constipation K59.09   ??? Hypertriglyceridemia E78.1   ??? Iron deficiency anemia D50.9   ??? Intermittent palpitations R00.2   ???  S/P IVC filter [id number]       Plan:    ICD-10-CM ICD-9-CM    1. Vaginal bleeding, abnormal  N93.9 623.8 US TRANSVAGINAL      REFERRAL TO  GYNECOLOGY   2. Uterine leiomyoma, unspecified location  D25.9 218.9 US TRANSVAGINAL      REFERRAL TO GYNECOLOGY   3. Insomnia, unspecified type  G47.00 780.52 melatonin 10 mg subl     ???Vaginal bleeding, abnormal  No menses in 5 years  Placed order for vaginal ultrasound  Placed referral to GYN    DDx:  Large uterine fibroid???most likely  Endometrial atrophy   Vaginal atrophy  Endometrial polyps    ??? Uterine fibroid  03/2020 abdominal CT revealed suspected uterine fibroid    ???Insomnia  Continue suvorexant 10 mg nightly.  She is to take this 30 minutes prior to going to bed.  Recommend she obtain melatonin 10 mg sublingual and placed at her bedside.  If she wakes in the middle the night, she is to place a melatonin under her tongue to allow her to go back to sleep.    Dr. Gaspar Bidding, AGNP-C, DNP  [facility]      Level:   The total time 14 minutes.  At least 50% of that time was spent in counseling and/or coordination of care. My summary of patient counseling and coordination of care includes reviewing medical record, assessing patient, placing orders, and discussing plan of care with patient. Prior to seeing this patient, I reviewed records, including previously completed appointments/records, reviewed specialty records in connect care, and updated visits from other providers since I saw the patient last.         [name], who was evaluated through a synchronous (real-time) audio only encounter, and/or her healthcare decision maker, is aware that it is a billable service, with coverage as determined by her insurance carrier. She provided verbal consent to proceed: Yes, and patient identification was verified. It was conducted pursuant to the emergency declaration under the [place], [place] waiver authority and the [employer] Act. A caregiver was present when appropriate. Ability to conduct physical exam  was limited. I was in the office. The patient was at home.

## 2020-05-28 NOTE — Telephone Encounter (Signed)
LVM for patient to return call with updated appointment.    Where: [facility]  When: Monday, 06/01/2020 @ 12:30pm  Please check-in @ 12pm    If pt has not had a hysterectomy she needs to drink lots of fluids prior to exam.  Bring ID and insurance card.

## 2020-05-28 NOTE — Telephone Encounter (Signed)
Patient reached and made aware of pelvic exam appt update. Pt verbalized understanding.

## 2020-05-29 ENCOUNTER — Encounter: Payer: MEDICARE | Attending: Physical Medicine & Rehabilitation | Primary: Nurse Practitioner

## 2020-06-01 ENCOUNTER — Encounter

## 2020-06-01 ENCOUNTER — Inpatient Hospital Stay: Admit: 2020-06-01 | Payer: MEDICARE | Attending: Nurse Practitioner | Primary: Nurse Practitioner

## 2020-06-01 DIAGNOSIS — D259 Leiomyoma of uterus, unspecified: Secondary | ICD-10-CM

## 2020-06-01 NOTE — Telephone Encounter (Signed)
Patient reached and made aware most GYN offices are booked out , she should try to be placed on a cancellation list. Informed pt I will fax over referral to [facility] and she can call and see when next available appt is. Pt verbalized understanding.

## 2020-06-01 NOTE — Progress Notes (Signed)
Pls inform pt I have placed an order for an MRI. [place] depicts questionable fibroid. Radiologist recommended MRI pelvis. Thank you

## 2020-06-01 NOTE — Progress Notes (Signed)
Patient reached and made aware of MRI ordered per Dr. Linton Rump. Patient states she has appt with scheduled with [facility] on 06/17/2020

## 2020-06-01 NOTE — Telephone Encounter (Signed)
-----   Message from [place] sent at 06/01/2020  3:52 PM EDT -----  Subject: Message to Provider    QUESTIONS  Information for Provider? Ultrasound was done today 06/01/2020. The obgyn   she was referred to does not availability until July but she is needing   another for one that can see her sooner  ---------------------------------------------------------------------------  --------------  CALL BACK INFO  What is the best way for the office to contact you? OK to leave message on   voicemail  Preferred Call Back Phone Number? [phone]  ---------------------------------------------------------------------------  --------------  SCRIPT ANSWERS  Relationship to Patient? Self

## 2020-06-02 NOTE — Telephone Encounter (Signed)
Patient reached and informed I faxed over referral to [facility] this morning. Instructed pt to call over tho their office to see if they have any availability soon so she can schedule an appt.

## 2020-06-02 NOTE — Telephone Encounter (Signed)
-----   Message from [place] sent at 06/01/2020  4:23 PM EDT -----  Subject: Referral Request    QUESTIONS   Reason for referral request? patient requesting referral for OBGYN   Has the physician seen you for this condition before? No   Preferred Specialist (if applicable)?   Do you already have an appointment scheduled?   Additional Information for Provider?   ---------------------------------------------------------------------------  --------------  CALL BACK INFO  What is the best way for the office to contact you? OK to leave message on   voicemail  Preferred Call Back Phone Number? [phone]  ---------------------------------------------------------------------------  --------------  SCRIPT ANSWERS  Relationship to Patient? Self

## 2020-06-03 ENCOUNTER — Encounter

## 2020-06-03 ENCOUNTER — Encounter: Payer: MEDICARE | Attending: Cardiovascular Disease | Primary: Nurse Practitioner

## 2020-06-04 ENCOUNTER — Telehealth

## 2020-06-04 ENCOUNTER — Ambulatory Visit: Payer: MEDICARE | Primary: Nurse Practitioner

## 2020-06-04 MED ORDER — LORAZEPAM 1 MG TAB
1 mg | ORAL_TABLET | Freq: Once | ORAL | 0 refills | Status: AC
Start: 2020-06-04 — End: 2020-06-04

## 2020-06-04 NOTE — Telephone Encounter (Signed)
I called the patient and advised. Patient verbalized understanding.

## 2020-06-04 NOTE — Telephone Encounter (Signed)
Pt just scheduled MRI Pelvis for 06/14/2020 and is claustophobic. She understands from scheduling it is a long machine and to ask provider for medication to help to relax         Pt request send to [facility] [place]

## 2020-06-04 NOTE — Telephone Encounter (Signed)
Pt just scheduled MRI Pelvis for 06/14/2020 and is claustophobic. She understands from scheduling it is a long machine and to ask provider for medication to help to relax       Pt request send to [facility] , [place]

## 2020-06-04 NOTE — Telephone Encounter (Signed)
escribed ativan 1mg . She is to take 1 hour prior to testing. She must have someone drive her to the testing site and home, patient is not to drive until medication effects have dissipated. Thank you      ICD-10-CM ICD-9-CM    1. 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 Claustrophobia  F40.240 300.29 LORazepam (ATIVAN) 1 mg tablet

## 2020-06-10 ENCOUNTER — Encounter

## 2020-06-10 MED ORDER — DULOXETINE 60 MG CAP, DELAYED RELEASE
60 mg | ORAL_CAPSULE | ORAL | 0 refills | Status: DC
Start: 2020-06-10 — End: 2020-06-29

## 2020-06-10 NOTE — Telephone Encounter (Signed)
Patient called to request refill for DULoxetine (CYMBALTA) 60 mg capsule      Confirmed pharmacy: [facility]    Phn#: [phone]

## 2020-06-10 NOTE — Telephone Encounter (Signed)
ok 

## 2020-06-10 NOTE — Telephone Encounter (Signed)
Next appt 06-29-20

## 2020-06-11 NOTE — Telephone Encounter (Signed)
LMOVM telling patient that her RX has been sent to the pharmacy.

## 2020-06-14 ENCOUNTER — Inpatient Hospital Stay: Admit: 2020-06-14 | Payer: MEDICARE | Attending: Nurse Practitioner | Primary: Nurse Practitioner

## 2020-06-14 ENCOUNTER — Encounter

## 2020-06-14 DIAGNOSIS — N950 Postmenopausal bleeding: Secondary | ICD-10-CM

## 2020-06-14 MED ORDER — GADOBENATE DIMEGLUMINE 529 MG/ML (0.1 MMOL/0.2 ML) IV
529 mg/mL (0.1mmol/0.2mL) | Freq: Once | INTRAVENOUS | Status: AC
Start: 2020-06-14 — End: 2020-06-14
  Administered 2020-06-14: 21:00:00 via INTRAVENOUS

## 2020-06-14 MED FILL — MULTIHANCE 529 MG/ML (0.1 MMOL/0.2 ML) INTRAVENOUS SOLUTION: 529 mg/mL (0.1mmol/0.2mL) | INTRAVENOUS | Qty: 20

## 2020-06-14 NOTE — Progress Notes (Signed)
Please inform pt her MRI reveals polyps and small fibroid. A referral was placed to GYN in April. Encourage her to schedule appt with GYN. Thank you

## 2020-06-15 LAB — AMB POC CREATININE
Creatinine, POC: 0.9 MG/DL (ref 0.6–1.3)
GFR African American: >60 mL/min/{1.73_m2} (ref >60)
GFR Non-African American: >60 mL/min/{1.73_m2} (ref >60)

## 2020-06-15 LAB — CREATININE, POC
Creatinine, POC: 0.9 MG/DL (ref 0.6–1.3)
GFRAA, POC: >60 mL/min/{1.73_m2} (ref >60)
GFRNA, POC: >60 mL/min/{1.73_m2} (ref >60)

## 2020-06-16 ENCOUNTER — Encounter: Attending: Physician Assistant | Primary: Nurse Practitioner

## 2020-06-17 ENCOUNTER — Encounter: Payer: MEDICARE | Attending: Cardiovascular Disease | Primary: Nurse Practitioner

## 2020-06-19 NOTE — Telephone Encounter (Signed)
I just looked in her chart and no MRI results are available. Pls reach out to imaging and inquire as to where the results are.

## 2020-06-19 NOTE — Telephone Encounter (Signed)
Patient requesting results to recent Pelv MRI. Patient also given contact info to [facility] since [facility] would not see her due to being late for appt.

## 2020-06-19 NOTE — Telephone Encounter (Signed)
-----   Message from Milas G[place] sent at 06/19/2020 10:28 AM EDT -----  Subject: Results Request    QUESTIONS  Which lab or imaging result is the patient calling about? MRI  Which provider ordered the test? [facility]   At what location was the test performed?   Date the test was performed? 2020-06-14  Additional Information for Provider? please call patient with the results  ---------------------------------------------------------------------------  --------------  CALL BACK INFO  What is the best way for the office to contact you? OK to leave message on   voicemail  Preferred Call Back Phone Number? [phone]  ---------------------------------------------------------------------------  --------------  SCRIPT ANSWERS  Relationship to Patient? Self

## 2020-06-22 NOTE — Telephone Encounter (Signed)
Pt called and was given message below

## 2020-06-22 NOTE — Telephone Encounter (Signed)
Reached out to [facility] and they told me the MRI hasnt been ready yet, they gave [facility]. Per  [facility], MRI for pt has not been read yet and they will have this resulted soon.

## 2020-06-25 NOTE — Telephone Encounter (Signed)
Patient reached and made aware to contact insurance company and inquire which GYN she can see and get phone + fax number. I can fax over referral once I have that information.

## 2020-06-25 NOTE — Telephone Encounter (Signed)
Pt was [facility] to [facility] , the office called and said they do not accept pt's insurance

## 2020-06-29 ENCOUNTER — Ambulatory Visit
Admit: 2020-06-29 | Discharge: 2020-06-29 | Payer: MEDICARE | Attending: Physical Medicine & Rehabilitation | Primary: Nurse Practitioner

## 2020-06-29 ENCOUNTER — Ambulatory Visit: Attending: Physical Medicine & Rehabilitation | Primary: Nurse Practitioner

## 2020-06-29 DIAGNOSIS — M961 Postlaminectomy syndrome, not elsewhere classified: Secondary | ICD-10-CM

## 2020-06-29 MED ORDER — DULOXETINE 60 MG CAP, DELAYED RELEASE
60 mg | ORAL_CAPSULE | ORAL | 0 refills | Status: DC
Start: 2020-06-29 — End: 2020-09-30

## 2020-06-29 NOTE — Telephone Encounter (Signed)
Pls have pt go on mychart and complete e-visit questionnaire. Thank you

## 2020-06-29 NOTE — Telephone Encounter (Signed)
LVM for patient to return call. 

## 2020-06-29 NOTE — Progress Notes (Signed)
Progress Notes by Lilian Coma, MD at 06/29/20 [place]                Author: Lilian Coma, MD  Service: --  Author Type: Physician       Filed: 06/29/20 1645  Encounter Date: 06/29/2020  Status: Signed          Editor: Lilian Coma, MD (Physician)                       [facility]  [address] [place] [address]  Phone: [phone]  Fax: [phone]            PROGRESS NOTE         HISTORY OF PRESENT ILLNESS:   The patient is a 50 y.o. female and was seen today for follow up of progressive lower back pain radiating into the BLE (LLE>RLE) in a S1 distribution to the feet involving  the digits x 07/2018 without specific trauma.??Pt has??a hx of L5-S1 fusion??performed in 03/2015 by Dr. Nena Polio Nundkumar??in [place],  [place].??Symptoms prior to her surgery consisted of low back pain extending posteriorly into her LLE with paresthesias to the toes. Patient did not experienced relief after the surgery. She reports her symptoms increased after her srurgery and now she c/o  low back pain extending into her BLE (L>R). Her pain is not positional. Pt previously received lumbar injections by Dr. Hassell Done without benefit (pt reports??3 in the past 12 months). Pt completed PT in 2020 without benefit.??She??is compliant with her??HEP.  She reports bladder incontinence since her surgery x 1 years. She states??she experienced some mild incontinence before her surgery, however it has worsened.??Pt had been followed by  [facility] and was being treated with Tylenol No. 4. Pt states she is  no longer followed by [facility] secondary to taking medications through Dr. Domingo Pulse.  Pt followed up with Dr. Domingo Pulse on 04/03/2019 and per pt, she had?? left rotator cuff tear repair??on 05/03/2019.She is scheduled to follow up with Dr. Domingo Pulse??on 09/18/2019??to discuss a total shoulder repair. The patient is??RHD. Note from??Dr. Rudi Heap??dated  02/19/16??indicating patient was seen  with c/o back pain since 03/2015. Patient underwent L5-S1 laminectomy without relief. It was noted, her??artery and??vein were??severed??and surgery had to be prolonged and was hospitalized for 40 days due  to surgical complication.??At that time she c/o paraesthesias on her BLE and UE as a result of her surgery.??Her pain is aggravated with any elongated position. At that time, she was treated with Neurontin, which she dc due to weigh gain. Now she  takes Topamax 50 mg BID for 6 months with minimum relief. Regarding Neurontin, patient reports tolerated well and her weight??gained has  not been stable since her dc Neurontin??300 mg BID. Required multiple follow surgeries including exploratory 03/27/15 to control bleeding  the the left common iliac and femoral bypass. Multiple follow up surgeries for abdominal washouts. She subsequently developed a LLE DVT, and an IVC filter was placed. Subsequently, it had been removed. Per patient, she returned to the OR to complete her  lumbar surgical fusion. That operative note was not available for our review.??Patient has a hx of injections before the surgery. Patient reports a hx of therapy before and after her surgery. She continues with her HEP daily.??Patient??admits??changes??in  bowel habits x 1 months.??She denies possibility of pregnancy??or breastfeeding.??Patient denies fever, weight loss, or skin changes. Note from??Dr. Belia Heman??dated 10/10/2017??indicating patient underwent Camera operator trial.??Note  from??Dr. Fox??dated 11/17/2017??indicating patient was seen for f/u low back pain radiating into the RLE. Pain was 6-10/10. Successful SCS trial . Indicated they were considering doing SCS implantation at the surgical center. Pt's BMI was 56 and they could not accommodate her. Indicated he was not comfortable performing surgery on her due to her obesity. She indicated that she was pursuing surgical  weight loss. Due to her previous abdominal surgery and scar tissue they would not be  able to do it laparoscopically and pt was not interested. Per pt,??Dr. Adam,??weight loss specialist at [facility] gave her 4 other specialists she could get a second  opinion from and the closest was at [place], [place].??ER note from??Dr. Kathalene Frames 12/08/2018??indicating patient presented for acute right knee pain.??Note from??[facility], Maryanna Shape, DNP??dated 01/09/2019??indicating patient was seen  with c/o chronic back pain. Indicated she is in pain management, with Dr. Nelva Bush. Pt was previously followed by Dr. Caryn Section.  SCS??trial has been beneficial for her pain. Indicated her insurance denied the stimulator she had the trial with and would only pay for??"generic brand". Indicated she cannot see Dr. Caryn Section because  she owes them money. Reported she needs assistance getting into and out of bathtub. Pt has vascular issues in BLE followed by Dr. Simon Rhein. ??Note from??Dr. Idelle Crouch??dated 03/19/2019??indicating patient was seen with c/o left shoulder pain. Rated her  pain 8/10. Limited ROM.??Suspected she had rotator cuff tear. Ordered MRI.?? ??Note from??Dr Fox??dated 04/08/2019??indicating patient was seen with a pain of 6/10. Indicated pt was unable to be accommodated for a SCS secondary to her BMI of 56.?? Lumbar spine MRI dated??09/09/15 report??reviewed.??Films not available. 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 Good alignment  at the fusion at L5-S1.??BLE EMG??dated 02/19/16 reviewed, Is within normal limits. No evidence of lumbosacral radiculopathy.?? Myelogram Lumbar??dated 06/16/2017??films not available for my??independent review. Per report, Successful lumbar myelogram. Minimal central canal encroachment at L3/L4.?? L spine CT??dated 06/16/2017??films not??available for my??independent review. Per report, Numbering for this exam is based on counting down from T1 vertebra on CT chest abdomen pelvis from 2011.  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 Below this are 4 lumbar type vertebral bodies with the sacralized L5. This numbering places the posterior fusion hardware at L4-L5 rather than L5-S1.  There is a rudimentary but visible L5-S1 disc space. Posterior fusion hardware and interbody fusion cage at L4-L5 disc level is intact. Loss of disc and foraminal height at this level results in what appears to be moderate to marked left and moderate  right foraminal stenosis. No focal disc pathology or central canal stenosis. Mild central canal stenosis and mild foraminal narrowing bilaterally at L3/L4 with moderate facet DJD.??At??her??last clinical appointment,??pt was scheduled to f/u with  Dr. Idelle Crouch tomorrow for her left shoulder. Pt reported a recent weight loss (47 Ibs) ??but I recommended she should follow up with Warren Danes, D.O., bariatrics. I restarted her on Cymbalta 30 mg and 60 mg daily.   ??   At LOV the patient returned after multiple cancellations. She admitted to following up with Dr. Karleen Hampshire, and per pt denied any treatment secondary to weight loss. Pt stated she has gained 7 Ibs since her last visit. She was complaint with her Cymbalta  60 mg daily. Pt stated she continued to be followed by Dr. Idelle Crouch secondary to a left shoulder rotator cuff repair done on 05/03/2019. Pt stated she  has been put on hold w/[facility] secondary to receiving medications through Dr. Domingo Pulse at  this time. Pt denied change in bowel or bladder habits. Patient wished to continue her current treatment, so I provided her refills of Cymbalta 60 mg daily.   ??      The patient returns today with pain location unchanged. She rates her  pain 4-6/10, previously 4-7/10.Pt states she has been compliant with her Cymbalta and HEP.  Pt also states she was recently seen for a hernia  and she has not been back to see Dr. Nat Christen.      Of note, she stopped taking Topamax 1 year ago, she has never taken Lyrica, and is not currently taking  Gabapentin. She is not currently on blood thinners.            PMP reviewed. Body mass index is 55.45 kg/m??.      PCP: Alla Feeling, DNP           Past Medical History:        Diagnosis  Date         ?  Arthritis       ?  Biliary dyskinesia  08/08/2011     ?  Carpal tunnel syndrome  01/26/2005     ?  Chronic constipation  07/25/2018          Dr Donovan Kail         ?  Chronic low back pain with sciatica  07/25/2018          Dr Berna Spare mgt         ?  Chronic pain of right knee  07/25/2018           Dr Vinson Moselle, ortho         ?  Constipation due to opioid therapy  07/25/2018     ?  DVT (deep venous thrombosis) ([place])  2017          Left groin; Dr Sallee Provencal, vascular, places IVF preop         ?  Dysphagia            Dr Luster Landsberg         ?  Esophageal obstruction            Dr Donovan Kail [facility]         ?  Hemorrhagic shock ([place])  09/17/2019     ?  History of fall  09/05/2018          see brief documentation on 09/05/2018         ?  Injury of left iliac artery  05/28/2015     ?  Insomnia  07/25/2018     ?  Migraine  02/11/2006          Formatting of this note might be different from the original. Updated invalid ICD 9 codes for ICD 10 go live         ?  Morbid obesity ([place])  03/11/2016     ?  Spasm of back muscles  07/25/2018     ?  Status post right knee replacement  07/25/2018           Dr Vinson Moselle, ortho         ?  Tear of left rotator cuff  12/02/2019     ?  Urinary urgency  06/05/2019         ?  Ventral hernia without obstruction or gangrene  08/31/2016  Social History          Socioeconomic History         ?  Marital status:  MARRIED              Spouse name:  Not on file         ?  Number of children:  Not on file     ?  Years of education:  Not on file     ?  Highest education level:  Not on file       Occupational History        ?  Not on file       Tobacco Use         ?  Smoking status:  Never Smoker     ?  Smokeless tobacco:  Never Used       Vaping Use         ?  Vaping Use:  Never used       Substance and Sexual  Activity         ?  Alcohol use:  No     ?  Drug use:  No     ?  Sexual activity:  Yes              Partners:  Male        Other Topics  Concern        ?  Not on file       Social History Narrative        ?  Not on file          Social Determinants of Health          Financial Resource Strain:         ?  Difficulty of Paying Living Expenses: Not on file       Food Insecurity:         ?  Worried About Running Out of Food in the Last Year: Not on file     ?  Ran Out of Food in the Last Year: Not on file       Transportation Needs:         ?  Lack of Transportation (Medical): Not on file     ?  Lack of Transportation (Non-Medical): Not on file       Physical Activity:         ?  Days of Exercise per Week: Not on file     ?  Minutes of Exercise per Session: Not on file       Stress:         ?  Feeling of Stress : Not on file       Social Connections:         ?  Frequency of Communication with Friends and Family: Not on file     ?  Frequency of Social Gatherings with Friends and Family: Not on file     ?  Attends Religious Services: Not on file     ?  Active Member of Clubs or Organizations: Not on file     ?  Attends Archivist Meetings: Not on file     ?  Marital Status: Not on file       Intimate Partner Violence:         ?  Fear of Current or Ex-Partner: Not on file     ?  Emotionally Abused: Not on file     ?  Physically Abused: Not on file     ?  Sexually Abused: Not on file       Housing Stability:         ?  Unable to Pay for Housing in the Last Year: Not on file     ?  Number of Places Lived in the Last Year: Not on file        ?  Unstable Housing in the Last Year: Not on file             Current Outpatient Medications          Medication  Sig  Dispense  Refill           ?  DULoxetine (CYMBALTA) 60 mg capsule  TAKE ONE CAPSULE BY MOUTH ONCE NIGHTLY  30 Capsule  0     ?  melatonin 10 mg subl  Place 1 tablet under tongue for break through sleeping.  30 Tablet  0     ?  suvorexant (BELSOMRA) 10 mg tablet   Take 1 tab by mouth 30 minutes before bedtime and at least 7 hours before planned time of awakening.  Indications: difficulty sleeping  90 Tablet  1     ?  celecoxib (CELEBREX) 200 mg capsule  Take 1 capsule by mouth twice daily  60 Capsule  11     ?  baclofen (LIORESAL) 10 mg tablet  Take 1 tablet by mouth three times daily for muscle spasms and pain  60 Tablet  11     ?  MULTIVITAMIN PO  Take  by mouth.         ?  oxybutynin (DITROPAN) 5 mg tablet  Take 1 Tablet by mouth two (2) times a day. Indications: a condition where the urge to urinate results in urine leakage  180 Tablet  1           ?  citalopram (CELEXA) 20 mg tablet  Take 1 Tablet by mouth daily.  90 Tablet  1           ?  cyclobenzaprine (AMRIX) 15 mg cp24 SR capsule  Take 15 mg by mouth daily.                 Allergies        Allergen  Reactions         ?  Propoxyphene N-Acetaminophen  Hives             Other reaction(s): Unknown         ?  Propoxyphene  Other (comments)             Reaction unknown         ?  Norco [Hydrocodone-Acetaminophen]  Itching               PHYSICAL EXAMINATION      Visit Vitals      Pulse  79     Temp  97.2 ??F (36.2 ??C)     Ht  5\' 3"  (1.6 m)     Wt  313 lb (142 kg)     SpO2  98%        BMI  55.45 kg/m??          CONSTITUTIONAL: NAD, A&O x 3  SENSATION : Decreased sensation circumferentially on the LLE.   NEURO: Hoffmann's is negative bilaterally.   RANGE OF MOTION: The patient has full passive range of motion in all four extremities.   MOTOR:  Straight Leg Raise: Negative, bilateral      Ambulates with n/a                                                Hip Flex  Knee Ext  Knee Flex  Ankle DF  GTE  Ankle PF  Tone              Right  +4/5  +4/5  +4/5  +4/5  +4/5  +4/5  +4/5              Left  +4/5  +4/5  +4/5  +4/5  +4/5  +4/5  +4/5           ASSESSMENT    Diagnoses and all orders for this visit:      1. Lumbar post-laminectomy syndrome      2. Lumbar neuritis      3. Left shoulder pain, unspecified chronicity                IMPRESSION AND PLAN:   Patient returns to the office today with c/o lower back pain radiating into the BLE (LLE>RLE) in a S1 distribution to the feet involving the digits x 07/2018 without specific trauma. Multiple treatment  options were discussed. I offered to schedule her for an injection and add Gabapentin to her medications ,but she declined both offers. I will refill her Cymbalta 60 mg for 90 days. Patient is neurologically intact. I will see the patient back in 3 month's  time or earlier if needed.         Written by Karis Juba, [employer], as dictated by Laney Pastor, MD   I examined the patient, reviewed and agree with the note.

## 2020-06-29 NOTE — Telephone Encounter (Signed)
-----   Message from Alla Feeling, [place] sent at 06/29/2020  7:51 AM EDT -----  Please inform pt her MRI reveals polyps and small fibroid. A referral was placed to GYN in April. Encourage her to schedule appt with GYN. Thank you

## 2020-06-29 NOTE — Telephone Encounter (Signed)
Patient reached and made aware of MRI results per Dr. Linton Rump. Pt verbalized understanding and states she will call and schedule appt with [facility]. Patient also wants Dr. Linton Rump to know the [facility] is not effective asking if she can try something else. Pt states she has taking 20 mg melatonin right now. Please advise.

## 2020-06-30 ENCOUNTER — Encounter: Payer: MEDICARE | Attending: Physician Assistant | Primary: Nurse Practitioner

## 2020-06-30 NOTE — Telephone Encounter (Signed)
LVM for patient to return call.

## 2020-06-30 NOTE — Telephone Encounter (Signed)
Patient returned call and was given message. She verbalized understanding and has no further questions at this time. Thank you

## 2020-07-03 ENCOUNTER — Encounter: Payer: MEDICARE | Attending: Physician Assistant | Primary: Nurse Practitioner

## 2020-07-16 ENCOUNTER — Encounter

## 2020-07-16 NOTE — Telephone Encounter (Signed)
rx for 90 day supply given recently

## 2020-07-16 NOTE — Telephone Encounter (Signed)
RX was requested by the pharmacy's automated line. No further action is needed.

## 2020-07-23 NOTE — Telephone Encounter (Signed)
Pls contact pt and inquire if she increased her celexa dose to 20mg  2 tabs. If so, I will send in celexa 40mg  and she will take 1 tab daily. Thank you

## 2020-07-23 NOTE — Telephone Encounter (Signed)
Patient states she is only taking the 20 mg of celexa daily. Also states that the [facility] that was prescribed to help with sleeping is not effective at all, states she hasn't stopped taking it yet but should would like to try something else.

## 2020-07-24 NOTE — Telephone Encounter (Signed)
Patient states she still has 2 refills left on her Celexa, she isnt sure why the [facility] pharmacy requested additional refills.

## 2020-07-24 NOTE — Telephone Encounter (Signed)
LVM for patient to return call.    RE:  Called pharmacy they stated patient has Celexa transferred to outside pharmacy. Pt also needs to schedule appt to discuss insomnia.

## 2020-07-24 NOTE — Telephone Encounter (Signed)
If she has not increased to the Celexa then she should have enough medication to last her until September.  In March I          E scribed 90 tablets +1 refill.  She needs to reach out to her pharmacy for another 35-month refill.      Have her schedule a virtual or face-to-face appointment to discuss medication for her insomnia.  Thank you    Requested Prescriptions     Refused Prescriptions Disp Refills   ??? citalopram (CELEXA) 20 mg tablet [Pharmacy Med Name: Citalopram HBR 20mg  Tablet] 30 Tablet 11     Sig: Take 1 tablet by mouth once daily     Refused By: Alla Feeling     Reason for Refusal: Refill not appropriate

## 2020-07-24 NOTE — Telephone Encounter (Signed)
Pt is returning call to nurse to discuss her medications.

## 2020-07-27 ENCOUNTER — Telehealth
Admit: 2020-07-27 | Discharge: 2020-07-27 | Payer: MEDICARE | Attending: Nurse Practitioner | Primary: Nurse Practitioner

## 2020-07-27 ENCOUNTER — Telehealth: Attending: Nurse Practitioner | Primary: Nurse Practitioner

## 2020-07-27 DIAGNOSIS — G4700 Insomnia, unspecified: Secondary | ICD-10-CM

## 2020-07-27 MED ORDER — SUVOREXANT 20 MG TABLET
20 mg | ORAL_TABLET | Freq: Every evening | ORAL | 1 refills | Status: DC | PRN
Start: 2020-07-27 — End: 2020-12-27

## 2020-07-27 NOTE — Progress Notes (Signed)
 Chief Complaint   Patient presents with   . Sleep Problem     pt reports difficulty falling asleep and staying asleep, current tx is not effective     1. Have you been to the ER, urgent care clinic since your last visit?  Hospitalized since your last visit? No    2. Have you seen or consulted any other health care providers outside of the [facility] since your last visit? Yes, [facility]     3. For patients aged 87-75: Has the patient had a colonoscopy / FIT/ Cologuard? Yes - no Care Gap present      If the patient is female:    4. For patients aged 34-74: Has the patient had a mammogram within the past 2 years? No Ordered 11/2019      5. For patients aged 21-65: Has the patient had a pap smear? Yes - no Care Gap present

## 2020-07-27 NOTE — Progress Notes (Signed)
[facility]  [place], [place]  [phone] fax    07/27/2020    HPI:   Tiffany [name] [birth year] is a pleasant BLACK/AFR[place] AMERICAN female who presents virtual today for a sick visit.    Past Medical History:   Diagnosis Date   ??? Arthritis    ??? Biliary dyskinesia 08/08/2011   ??? Carpal tunnel syndrome 01/26/2005   ??? Chronic constipation 07/25/2018    Dr Donovan Kail   ??? 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 Chronic pain of right knee 07/25/2018     Dr Vinson Moselle, ortho   ??? Constipation due to opioid therapy 07/25/2018   ??? DVT (deep venous thrombosis) ([place]) 2017    Left groin; Dr Sallee Provencal, vascular, places IVF preop   ??? Dysphagia     Dr Luster Landsberg   ??? Esophageal obstruction     Dr Luster Landsberg   ??? Hemorrhagic shock ([place]) 09/17/2019   ??? History of fall 09/05/2018    see brief documentation on 09/05/2018   ??? 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 Insomnia 07/25/2018   ??? Migraine 02/11/2006    Formatting of this note might be different from the original. Updated invalid ICD 9 codes for ICD 10 go live   ??? Morbid obesity ([place]) 03/11/2016   ??? Spasm of back muscles 07/25/2018   ??? 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 Tear of left rotator cuff 12/02/2019   ??? Urinary urgency 06/05/2019   ??? Ventral hernia without obstruction or gangrene 08/31/2016     Past Surgical History:   Procedure Laterality Date   ??? COLONOSCOPY N/A 10/12/2017    COLONOSCOPY performed by Alferd Patee, MD at [facility]   ??? COLONOSCOPY N/A 10/12/2018    COLONOSCOPY performed by Alferd Patee, MD at [facility] ENDOSCOPY   ??? HAND/FINGER SURGERY UNLISTED Bilateral     CTR   ??? HX ADENOIDECTOMY     ??? HX CHOLECYSTECTOMY  2013   ??? HX COLONOSCOPY  1999    screening - neg results per patient   ??? HX ENDOSCOPY      screening - neg results per patient   ??? HX ENDOSCOPY  10/12/2018    Dr Judie Grieve, [facility]   ??? HX GYN      ectopic preg with tube rupture    ??? HX KNEE REPLACEMENT Right 12/2017     Dr Vinson Moselle, ortho   ??? HX LUMBAR FUSION  3086    V7-Q4 - complicated by vascular injury   ??? HX ORTHOPAEDIC      left foot surgery - stress fx   ??? HX ORTHOPAEDIC      L &R carpal tunnel   ??? HX TONSILLECTOMY     ??? IR IVC FILTER  2017    removed June 2017 noted in care everywhere   ??? PR ABDOMEN SURGERY PROC UNLISTED  2017    Open abdomen management (14 days) after vascular injury during lumbar fusion   ??? PR ANESTH,SURGERY OF SHOULDER  05/03/2019    left shoulder   ??? VASCULAR SURGERY PROCEDURE UNLIST      vascular repair of aortic/venocaval repair.      Current Outpatient Medications   Medication Sig   ??? suvorexant (BELSOMRA) 20 mg tablet Take 1 Tablet by mouth nightly as needed for Insomnia. Max Daily  Amount: 20 mg.   ??? DULoxetine (CYMBALTA) 60 mg capsule TAKE ONE CAPSULE BY MOUTH ONCE NIGHTLY   ??? melatonin 10 mg subl Place 1 tablet under tongue for break through sleeping.   ??? celecoxib (CELEBREX) 200 mg capsule Take 1 capsule by mouth twice daily   ??? baclofen (LIORESAL) 10 mg tablet Take 1 tablet by mouth three times daily for muscle spasms and pain   ??? MULTIVITAMIN PO Take  by mouth.   ??? oxybutynin (DITROPAN) 5 mg tablet Take 1 Tablet by mouth two (2) times a day. Indications: a condition where the urge to urinate results in urine leakage   ??? citalopram (CELEXA) 20 mg tablet Take 1 Tablet by mouth daily.   ??? cyclobenzaprine (AMRIX) 15 mg cp24 SR capsule Take 15 mg by mouth daily.     No current facility-administered medications for this visit.     Allergies and Intolerances:   Allergies   Allergen Reactions   ??? Propoxyphene N-Acetaminophen Hives     Other reaction(s): Unknown   ??? Propoxyphene Other (comments)     Reaction unknown   ??? Norco [Hydrocodone-Acetaminophen] Itching     Family History:   Family History   Problem Relation Age of Onset   ??? Hypertension Mother    ??? Heart Disease Father    ??? Hypertension Father    ??? Heart Disease Paternal Aunt    ??? Heart Disease  Paternal Uncle    ??? Heart Disease Paternal Grandmother      Social History:   She  reports that she has never smoked. She has never used smokeless tobacco.   Social History     Substance and Sexual Activity   Alcohol Use No     Immunization History:    There is no immunization history on file for this patient.    Todays concern:  1.  Insomnia.  Currently taking Belsomra 10 mg nightly with melatonin if she has break in her sleep.  Last night was the first night if she has slept for 8 hours but this was due to a very busy weekend.  Usually she will go to bed 9-10 PM wake up around 2 AM.  She does not turn on lights she goes over to the fish tank and reads her book.  She tries calming music and breathing exercises but unfortunately when she returns back to her bed she is staring at the walls.  She denies taking naps during the day.  She denies psychological concerns or stress.    2.  Vaginal bleeding.  She is scheduled for a biopsy by [facility] tomorrow.  Her bleeding is intermittent.    Review of Systems:   As above included in HPI.  Otherwise 11 point review of systems negative including constitutional, skin, HENT, eyes, respiratory, cardiovascular, gastrointestinal, genitourinary, musculoskeletal, endocrine, hematologic, allergy, and neurologic.      Physical:   Exam:   Vital Signs: (As obtained by patient/caregiver at home)  There were not vitals taken for this visit.     PHYSICAL EXAMINATION:  [ INSTRUCTIONS:  "[x] " Indicates a positive item  "[] " Indicates a negative item  -- DELETE ALL ITEMS NOT EXAMINED]      Constitutional: [x]  Appears well-developed and well-nourished [x]  No apparent distress      []  Abnormal - morbidly obese    Mental status: [x]  Alert and awake  [x]  Oriented to person/place/time [x]  Able to follow commands    []  Abnormal -  Eyes:   EOM    [x]   Normal    []  Abnormal -   Sclera  [x]   Normal    []  Abnormal -          Discharge [x]   None visible   []  Abnormal -     HENT, Neck: [x]   Normocephalic, atraumatic  []  Abnormal - .  [x]  Mouth/Throat: Mucous membranes are moist    Pulmonary/Chest: [x]  Respiratory effort normal   [x]  No visualized signs of difficulty breathing or respiratory distress        []  Abnormal -      Musculoskeletal:   []  Normal gait with no signs of ataxia         []  Normal range of motion of neck        []  Abnormal -did not assess    Neurological:        [x]  No Facial Asymmetry (Cranial nerve 7 motor function) (limited exam due to video visit)          [x]  No gaze palsy        []  Abnormal -          Skin:        []  No significant exanthematous lesions or discoloration noted on facial skin         []  Abnormal -did not assess           Psychiatric:       [x]  Normal Affect []  Abnormal -        [x]  No Hallucinations    Plan:      ICD-10-CM ICD-9-CM    1. Insomnia, unspecified type  G47.00 780.52 suvorexant (BELSOMRA) 20 mg tablet     1. Insomnia, unspecified type  Take Belsomra 10 mg 2 tabs nightly 30 minutes before bed.  If experiences break in sleep, may take melatonin  Trial this dose for 2 weeks, if insomnia does not improve, may initiate clonidine 0.1 mg nightly.    - suvorexant (BELSOMRA) 20 mg tablet; Take 1 Tablet by mouth nightly as needed for Insomnia. Max Daily Amount: 20 mg.  Dispense: 90 Tablet; Refill: 1        Location:  Provider-in office  Patient: Home      Dr. Gaspar Bidding, AGNP-C, DNP  [facility]     The total time 20 minutes.  At least 50% of that time was spent in counseling and/or coordination of care. My summary of patient counseling and coordination of care includes reviewing medical record, assessing patient, placing orders, and discussing plan of care with patient. Prior to seeing this patient, I reviewed records, including previously completed appointments/records, reviewed specialty records in connect care, and updated visits from other providers since I saw the patient last.       Tiffany [name], who was evaluated through a  synchronous (real-time) audio-video encounter, and/or her healthcare decision maker, is aware that it is a billable service, which includes applicable co-pays, with coverage as determined by her insurance carrier. She provided verbal consent to proceed: Yes, and patient identification was verified. This visit was conducted pursuant to the emergency declaration under the [place], [place] waiver authority and the [employer] Act. A caregiver was present when appropriate. Ability to conduct physical exam was limited. The patient was located in a state where the provider was licensed to provide care.     --Alla Feeling, DNP on 07/27/2020 at  2:44 PM

## 2020-08-03 ENCOUNTER — Ambulatory Visit: Admit: 2020-08-03 | Discharge: 2020-08-03 | Payer: MEDICARE | Attending: Specialist | Primary: Nurse Practitioner

## 2020-08-03 ENCOUNTER — Ambulatory Visit: Attending: Specialist | Primary: Nurse Practitioner

## 2020-08-03 DIAGNOSIS — I440 Atrioventricular block, first degree: Secondary | ICD-10-CM

## 2020-08-03 NOTE — Progress Notes (Signed)
HISTORY OF PRESENT ILLNESS  Tiffany [name] is a 50 y.o. female.    New Patient  The history is provided by the patient. This is a chronic problem. The problem occurs every several days. The problem has not changed since onset.Associated symptoms include shortness of breath. Pertinent negatives include no chest pain, no abdominal pain and no headaches.   Shortness of Breath  The history is provided by the patient. This is a chronic problem. The problem occurs intermittently.The problem has not changed since onset.Pertinent negatives include no fever, no headaches, no ear pain, no neck pain, no cough, no sputum production, no hemoptysis, no wheezing, no PND, no orthopnea, no chest pain, no vomiting, no abdominal pain, no rash, no leg swelling and no claudication.   Follow-up  Associated symptoms include shortness of breath. Pertinent negatives include no chest pain, no abdominal pain and no headaches.       Review of Systems   Constitutional: Negative for chills, diaphoresis, fever and weight loss.   HENT: Negative for ear pain and hearing loss.    Eyes: Negative for blurred vision.   Respiratory: Positive for shortness of breath. Negative for cough, hemoptysis, sputum production, wheezing and stridor.    Cardiovascular: Negative for chest pain, palpitations, orthopnea, claudication, leg swelling and PND.   Gastrointestinal: Negative for abdominal pain, heartburn, nausea and vomiting.   Musculoskeletal: Negative for myalgias and neck pain.   Skin: Negative for rash.   Neurological: Negative for dizziness, tingling, tremors, focal weakness, loss of consciousness, weakness and headaches.   Psychiatric/Behavioral: Negative for depression and suicidal ideas.     Family History   Problem Relation Age of Onset   ??? Hypertension Mother    ??? Heart Disease Father    ??? Hypertension Father    ??? Heart Disease Paternal Aunt    ??? Heart Disease Paternal Uncle    ??? Heart Disease Paternal Grandmother        Past Medical History:    Diagnosis Date   ??? Arthritis    ??? Biliary dyskinesia 08/08/2011   ??? Carpal tunnel syndrome 01/26/2005   ??? Chronic constipation 07/25/2018    Dr Donovan Kail   ??? 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 Chronic pain of right knee 07/25/2018     Dr Vinson Moselle, ortho   ??? Constipation due to opioid therapy 07/25/2018   ??? DVT (deep venous thrombosis) ([place]) 2017    Left groin; Dr Sallee Provencal, vascular, places IVF preop   ??? Dysphagia     Dr Luster Landsberg   ??? Esophageal obstruction     Dr Luster Landsberg   ??? Hemorrhagic shock ([place]) 09/17/2019   ??? History of fall 09/05/2018    see brief documentation on 09/05/2018   ??? 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 Insomnia 07/25/2018   ??? Migraine 02/11/2006    Formatting of this note might be different from the original. Updated invalid ICD 9 codes for ICD 10 go live   ??? Morbid obesity ([place]) 03/11/2016   ??? Spasm of back muscles 07/25/2018   ??? 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 Tear of left rotator cuff 12/02/2019   ??? Urinary urgency 06/05/2019   ??? Ventral hernia without obstruction or gangrene 08/31/2016       Past Surgical History:   Procedure Laterality Date   ??? COLONOSCOPY N/A 10/12/2017    COLONOSCOPY  performed by Alferd Patee, MD at [facility]   ??? COLONOSCOPY N/A 10/12/2018    COLONOSCOPY performed by Alferd Patee, MD at [facility] ENDOSCOPY   ??? HAND/FINGER SURGERY UNLISTED Bilateral     CTR   ??? HX ADENOIDECTOMY     ??? HX CHOLECYSTECTOMY  2013   ??? HX COLONOSCOPY  1999    screening - neg results per patient   ??? HX ENDOSCOPY      screening - neg results per patient   ??? HX ENDOSCOPY  10/12/2018    Dr Judie Grieve, [facility]   ??? HX GYN      ectopic preg with tube rupture   ??? HX KNEE REPLACEMENT Right 12/2017     Dr Vinson Moselle, ortho   ??? HX LUMBAR FUSION  2725    D6-U4 - complicated by vascular injury   ??? HX ORTHOPAEDIC      left foot surgery - stress fx   ??? HX ORTHOPAEDIC      L &R carpal tunnel   ??? HX TONSILLECTOMY     ??? IR IVC FILTER  2017    removed June  2017 noted in care everywhere   ??? PR ABDOMEN SURGERY PROC UNLISTED  2017    Open abdomen management (14 days) after vascular injury during lumbar fusion   ??? PR ANESTH,SURGERY OF SHOULDER  05/03/2019    left shoulder   ??? VASCULAR SURGERY PROCEDURE UNLIST      vascular repair of aortic/venocaval repair.        Social History     Tobacco Use   ??? Smoking status: Never Smoker   ??? Smokeless tobacco: Never Used   Substance Use Topics   ??? Alcohol use: No       Allergies   Allergen Reactions   ??? Propoxyphene N-Acetaminophen Hives     Other reaction(s): Unknown   ??? Propoxyphene Other (comments)     Reaction unknown   ??? Norco [Hydrocodone-Acetaminophen] Itching       Outpatient Medications Marked as Taking for the 08/03/20 encounter (Office Visit) with Tona Sensing, MD   Medication Sig Dispense Refill   ??? suvorexant (BELSOMRA) 20 mg tablet Take 1 Tablet by mouth nightly as needed for Insomnia. Max Daily Amount: 20 mg. 90 Tablet 1   ??? DULoxetine (CYMBALTA) 60 mg capsule TAKE ONE CAPSULE BY MOUTH ONCE NIGHTLY (Patient taking differently: 30 mg. TAKE ONE CAPSULE BY MOUTH ONCE NIGHTLY) 90 Capsule 0   ??? celecoxib (CELEBREX) 200 mg capsule Take 1 capsule by mouth twice daily 60 Capsule 11   ??? baclofen (LIORESAL) 10 mg tablet Take 1 tablet by mouth three times daily for muscle spasms and pain 60 Tablet 11   ??? MULTIVITAMIN PO Take  by mouth.     ??? oxybutynin (DITROPAN) 5 mg tablet Take 1 Tablet by mouth two (2) times a day. Indications: a condition where the urge to urinate results in urine leakage 180 Tablet 1        Visit Vitals  BP 123/73 (BP 1 Location: Left upper arm, BP Patient Position: Sitting, BP Cuff Size: Adult)   Pulse 82   Ht 5\' 3"  (1.6 m)   Wt 142.9 kg (315 lb)   SpO2 100%   BMI 55.80 kg/m??       Physical Exam  Constitutional:       Appearance: She is well-developed.      Comments: Morbidly obese   HENT:      Head: Normocephalic and atraumatic.  Eyes:      General: No scleral icterus.     Conjunctiva/sclera:  Conjunctivae normal.   Neck:      Vascular: No JVD.   Cardiovascular:      Rate and Rhythm: Normal rate and regular rhythm.      Heart sounds: Normal heart sounds. No murmur heard.  No friction rub. No gallop.    Pulmonary:      Effort: Pulmonary effort is normal. No respiratory distress.      Breath sounds: Normal breath sounds. No stridor. No wheezing or rales.   Chest:      Chest wall: No tenderness.   Abdominal:      General: There is no distension.      Tenderness: There is no abdominal tenderness.   Musculoskeletal:         General: No tenderness. Normal range of motion.      Cervical back: Normal range of motion and neck supple.   Lymphadenopathy:      Cervical: No cervical adenopathy.   Skin:     Findings: No erythema or rash.   Neurological:      Mental Status: She is alert and oriented to person, place, and time.      Cranial Nerves: No cranial nerve deficit.   Psychiatric:         Behavior: Behavior normal.       ekg sinus rhythm with 1 av block, low voltage QRS ( not new- noted in 2019)    ASSESSMENT and PLAN    ICD-10-CM ICD-9-CM    1. First degree heart block by electrocardiogram  I44.0 426.11     Asymptomatic monitor   2. Morbid obesity ([place])  E66.01 278.01     Continue dietary modification and exercise labs reviewed.   3. Chest pressure  R07.89 786.59     Clinically atypical.  Past CT scan had not shown any significant atherosclerosis in aorta or coronary artery on limited study we will continue monitoring clinic     No orders of the defined types were placed in this encounter.    Follow-up and Dispositions    ?? Return in about 1 year (around 08/03/2021).       current treatment plan is effective, no change in therapy  reviewed diet, exercise and weight control.    Patient is a 50 year old female morbidly obese with  back surgery and complicated postop course seen for abnormal EKG.  Has mild stable dyspnea.  No orthopnea PND.  No chest pain.  Occasional palpitations no syncope or near syncope.  EKG  reveals sinus rhythm with first-degree AV block.  Has low voltage QRS-noted from prior EKG in 2019.  Recommend to obtain TSH.  Since she has stable symptoms no indication for further cardiac work-up at this time.  Will consider echocardiogram if dyspnea progresses.  Meanwhile advised weight reduction.  07/2020  Stable cardiac status.  Occasional atypical chest pain clinically appears noncardiac.  Recent abdominal CT scan with elevated aortic and coronary evaluation without any calcification.

## 2020-08-03 NOTE — Progress Notes (Signed)
1. Have you been to the ER, urgent care clinic since your last visit?  Hospitalized since your last visit?No    2. Have you seen or consulted any other health care providers outside of the [facility] since your last visit?  Include any pap smears or colon screening. Yes Where: [facility] doctor

## 2020-08-24 ENCOUNTER — Encounter

## 2020-08-24 NOTE — Telephone Encounter (Signed)
Too soon

## 2020-08-24 NOTE — Telephone Encounter (Signed)
RX was requested by the pharmacy's automated line. No further action is needed.

## 2020-08-24 NOTE — Telephone Encounter (Signed)
Requested Prescriptions     Refused Prescriptions Disp Refills   ??? citalopram (CELEXA) 20 mg tablet [Pharmacy Med Name: Citalopram Hydrobromide 20mg  Tablet] 30 Tablet 11     Sig: Take 1 tablet by mouth once daily     Refused By: Alla Feeling     Reason for Refusal: Medication Discontinued   ??? oxybutynin (DITROPAN) 5 mg tablet [Pharmacy Med Name: Oxybutynin CL 5mg  Tablet] 60 Tablet 11     Sig: Take 1 tablet by mouth twice daily     Refused By: Alla Feeling     Reason for Refusal: Patient has requested refill too soon

## 2020-09-28 ENCOUNTER — Ambulatory Visit: Payer: MEDICARE | Attending: Physical Medicine & Rehabilitation

## 2020-09-30 ENCOUNTER — Encounter

## 2020-09-30 MED ORDER — DULOXETINE 60 MG CAP, DELAYED RELEASE
60 mg | ORAL_CAPSULE | ORAL | 0 refills | Status: AC
Start: 2020-09-30 — End: 2020-10-23

## 2020-09-30 NOTE — Telephone Encounter (Signed)
Last Visit: 06/29/20 with MD Vella Kohler  Next Appointment: 10/30/20 with MD Vella Kohler  Previous Refill Encounter(s): 06/29/20 #90    Requested Prescriptions     Pending Prescriptions Disp Refills    DULoxetine (CYMBALTA) 60 mg capsule 90 Capsule 0     Sig: TAKE ONE CAPSULE BY MOUTH ONCE NIGHTLY         For Pharmacy Admin Tracking Only    CPA in place:   Recommendation Provided To:   Intervention Detail: New Rx: 1, reason: Patient Preference  Gap Closed?:   Intervention Accepted By:   Time Spent (min): 5

## 2020-10-04 ENCOUNTER — Encounter

## 2020-10-04 NOTE — Telephone Encounter (Signed)
Requested Prescriptions     Refused Prescriptions Disp Refills    oxybutynin (DITROPAN) 5 mg tablet [Pharmacy Med Name: Oxybutynin CL '5mg'$  Tablet] 60 Tablet 11     Sig: Take 1 tablet by mouth twice daily     Refused By: Alla Feeling     Reason for Refusal: Patient has requested refill too soon     Refill good til 10/28/20

## 2020-10-06 MED ORDER — OXYBUTYNIN CHLORIDE 5 MG TAB
5 mg | ORAL_TABLET | Freq: Two times a day (BID) | ORAL | 0 refills | Status: AC
Start: 2020-10-06 — End: 2021-01-04

## 2020-10-06 NOTE — Addendum Note (Signed)
Addendum Note  by Self, Tiffany at 10/06/20 1303                Author: Self, Tiffany  Service: --  Chief Strategy Officer Type: Technician       Filed: 10/06/20 1303  Encounter Date: 10/04/2020  Status: Signed          Editor: Self, Tiffany (Technician)          Addended by: Haydee Monica on: 10/06/2020 01:03 PM    Modules accepted: Orders

## 2020-10-06 NOTE — Telephone Encounter (Signed)
Previous Rx was sent to [facility]. [facility] Is requesting a new Rx.    Last Visit: 07/27/20 with Dr. Governor Rooks  Next Appointment: 11/23/20 with Dr. Governor Rooks    Requested Prescriptions     Pending Prescriptions Disp Refills    oxybutynin (DITROPAN) 5 mg tablet 180 Tablet 1     Sig: Take 1 Tablet by mouth two (2) times a day. Indications: a condition where the urge to urinate results in urine leakage     Refused Prescriptions Disp Refills    oxybutynin (DITROPAN) 5 mg tablet [Pharmacy Med Name: Oxybutynin CL 5mg  Tablet] 60 Tablet 11     Sig: Take 1 tablet by mouth twice daily     Refused By: Verdon Cummins     Reason for Refusal: Patient has requested refill too soon         For Pharmacy Admin Tracking Only    CPA in place:   Recommendation Provided To:   Intervention Detail: New Rx: 1, reason: Patient Preference  Gap Closed?:   Intervention Accepted By:   Time Spent (min): 5

## 2020-10-07 ENCOUNTER — Encounter

## 2020-10-07 MED ORDER — CITALOPRAM 20 MG TAB
20 mg | ORAL_TABLET | Freq: Every day | ORAL | 0 refills | Status: AC
Start: 2020-10-07 — End: 2020-12-30

## 2020-10-07 NOTE — Telephone Encounter (Signed)
Patient reached and made aware her last rx of citalopram should hold her until around 10/30/2020. Patient states she has enough to last her until 10/12/2020, she is requesting a refill to last her until her 10/17/202 appt.  Please advise.

## 2020-10-07 NOTE — Telephone Encounter (Signed)
She should have 2 weeks remaining of the celexa. I have sent refill to her pharmacy.    Requested Prescriptions     Signed Prescriptions Disp Refills    citalopram (CELEXA) 20 mg tablet 60 Tablet 0     Sig: Take 1 Tablet by mouth daily.     Authorizing Provider: Alla Feeling

## 2020-10-07 NOTE — Telephone Encounter (Signed)
Pt made aware.

## 2020-10-07 NOTE — Telephone Encounter (Signed)
[facility] is calling stating patient is requesting a refill on Citalopram 20 mg.    I don't see where this is a current medication.

## 2020-10-23 ENCOUNTER — Encounter

## 2020-10-23 MED ORDER — DULOXETINE 60 MG CAP, DELAYED RELEASE
60 mg | ORAL_CAPSULE | ORAL | 0 refills | Status: AC
Start: 2020-10-23 — End: ?

## 2020-10-23 NOTE — Telephone Encounter (Signed)
Ok refill.  No further refills until seen

## 2020-10-23 NOTE — Telephone Encounter (Signed)
Patient has appt on 10-30-20

## 2020-10-23 NOTE — Telephone Encounter (Signed)
Last Visit: 06/29/20 with MD Vella Kohler  Next Appointment: 10/30/20 with MD Vella Kohler  Previous Refill Encounter(s): 09/30/20 #30    Requested Prescriptions     Pending Prescriptions Disp Refills    DULoxetine (CYMBALTA) 60 mg capsule 30 Capsule 0     Sig: TAKE ONE CAPSULE BY MOUTH ONCE NIGHTLY         For Pharmacy Admin Tracking Only    CPA in place:   Recommendation Provided To:   Intervention Detail: New Rx: 1, reason: Patient Preference  Gap Closed?:   Intervention Accepted By:   Time Spent (min): 5

## 2020-10-30 ENCOUNTER — Encounter: Attending: Physical Medicine & Rehabilitation | Primary: Nurse Practitioner

## 2020-11-03 ENCOUNTER — Encounter

## 2020-11-03 NOTE — Telephone Encounter (Signed)
LMOVM telling patient that we received a refill request from her pharmacy and that she will need to be seen prior to receiving a refill. The office number was provided for her to call and schedule an appointment.

## 2020-11-03 NOTE — Telephone Encounter (Signed)
Requested Prescriptions     Refused Prescriptions Disp Refills    citalopram (CELEXA) 20 mg tablet [Pharmacy Med Name: Citalopram Hydrobromide 20mg  Tablet] 30 Tablet 11     Sig: Take 1 tablet by mouth once daily     Refused By: Alla Feeling     Reason for Refusal: Request already responded to by other means (e.g. phone or fax)     10/07/20 rx 60 tabs to hold til 11/23/20 appt. Refills not needed

## 2020-11-05 ENCOUNTER — Encounter

## 2020-11-06 NOTE — Telephone Encounter (Signed)
LMOVM telling patient that we received a refill request from her pharmacy and that she will need to be seen prior to receiving this refill. The number to the office was provided.

## 2020-11-16 ENCOUNTER — Encounter

## 2020-11-23 ENCOUNTER — Ambulatory Visit: Payer: MEDICARE | Attending: Nurse Practitioner | Primary: Nurse Practitioner

## 2020-11-25 ENCOUNTER — Encounter: Attending: Nurse Practitioner

## 2020-12-04 ENCOUNTER — Encounter

## 2020-12-04 NOTE — Telephone Encounter (Signed)
LMTRC

## 2020-12-04 NOTE — Telephone Encounter (Signed)
Last Visit: 07/27/20  Next Appointment: 11/23/20 pt cancelled appt  Previous Refill Encounter(s): 10/06/20 Ditropan #180, 10/07/20 Celexa #60    Requested Prescriptions     Pending Prescriptions Disp Refills    citalopram (CELEXA) 20 mg tablet 30 Tablet 0     Sig: Take 1 Tablet by mouth daily.    oxybutynin (DITROPAN) 5 mg tablet 60 Tablet 0     Sig: Take 1 Tablet by mouth two (2) times a day. Indications: a condition where the urge to urinate results in urine leakage         For Pharmacy Admin Tracking Only    CPA in place:   Recommendation Provided To:   Intervention Detail: New Rx: 2, reason: Patient Preference and Scheduled Appointment  Gap Closed?:   Intervention Accepted By:   Time Spent (min): 5

## 2020-12-06 ENCOUNTER — Encounter

## 2020-12-07 NOTE — Telephone Encounter (Signed)
Left 2nd message

## 2020-12-08 NOTE — Telephone Encounter (Signed)
Pt has not returned call. Sent letter.

## 2020-12-27 ENCOUNTER — Encounter

## 2020-12-27 MED ORDER — BELSOMRA 20 MG TABLET
20 mg | ORAL_TABLET | ORAL | 2 refills | Status: AC
Start: 2020-12-27 — End: ?

## 2020-12-27 NOTE — Telephone Encounter (Signed)
Requested Prescriptions     Signed Prescriptions Disp Refills    suvorexant (Belsomra) 20 mg tablet 30 Tablet 2     Sig: Take 1 tablet by mouth at bedtime as needed for insomnia. MDD: 20mg      Authorizing Provider: Gaspar Bidding M     Pt needs to schedule a virtual appt in the next 90 days to continue to receive refills. Thank you

## 2020-12-29 ENCOUNTER — Encounter

## 2020-12-29 NOTE — Telephone Encounter (Signed)
I contacted the patient to schedule an appt. Patient stated she has moved to [facility] and is in the process of finding a new doctor.

## 2020-12-30 MED ORDER — CITALOPRAM 20 MG TAB
20 mg | ORAL_TABLET | ORAL | 1 refills | Status: AC
Start: 2020-12-30 — End: ?

## 2020-12-30 NOTE — Telephone Encounter (Signed)
I have refilled her medication for 30 tablets +1 refill to allow her additional time to locate a new provider in [place].    Requested Prescriptions     Signed Prescriptions Disp Refills    citalopram (CELEXA) 20 mg tablet 30 Tablet 1     Sig: Take 1 tablet by mouth once daily     Authorizing Provider: Alla Feeling

## 2021-01-02 ENCOUNTER — Encounter

## 2021-01-04 MED ORDER — OXYBUTYNIN CHLORIDE 5 MG TAB
5 mg | ORAL_TABLET | ORAL | 0 refills | Status: AC
Start: 2021-01-04 — End: ?

## 2021-02-01 ENCOUNTER — Encounter

## 2021-02-08 ENCOUNTER — Encounter

## 2021-03-04 MED ORDER — CELECOXIB 200 MG CAP
200 mg | ORAL_CAPSULE | ORAL | 11 refills | Status: AC
Start: 2021-03-04 — End: ?

## 2021-03-04 NOTE — Telephone Encounter (Signed)
Refill Request  Drug: Baclofen 10MG  TABLET

## 2021-03-05 MED ORDER — BACLOFEN 10 MG TAB
10 mg | ORAL_TABLET | Freq: Three times a day (TID) | ORAL | 2 refills | Status: AC
Start: 2021-03-05 — End: ?

## 2021-03-05 NOTE — Addendum Note (Signed)
Addendum Note by Rogelia Boga, PA at 03/05/21 3664                Author: Rogelia Boga, PA  Service: --  Author Type: Physician Assistant       Filed: 03/05/21 0718  Encounter Date: 03/04/2021  Status: Signed          Editor: Rogelia Boga, PA (Physician Assistant)          Addended by: Rogelia Boga on: 03/05/2021 07:18 AM    Modules accepted: Orders

## 2021-03-05 NOTE — Telephone Encounter (Signed)
No answer, LMOVM to Notify patient that RX refill was sent to Pharmacy.

## 2021-03-05 NOTE — Telephone Encounter (Signed)
Rx sent

## 2021-04-02 NOTE — Telephone Encounter (Signed)
[place] 04/02/2021    The last fill date  was 03/05/2021 for a 30 d/s qty 30      Last UDS: not on file    CSA Last signed: not on file        PCP: Alla Feeling, DNP    Last appt: 07/27/2020  Future Appointments   Date Time Provider [place]   08/03/2021 11:30 AM Tona Sensing, MD [facility]       Requested Prescriptions     Pending Prescriptions Disp Refills    BELSOMRA 20 MG TABS [Pharmacy Med Name: Belsomra 20mg  Tablet] 30 tablet 5     Sig: Take 1 tablet by mouth at bedtime as needed for insomnia. MDD: 20mg 

## 2021-04-02 NOTE — Telephone Encounter (Signed)
Pt moved to [place]. Back in Nov I gave her 2 months supply to hold her til she found a new PCP. I can no longer refill her meds. Thank you

## 2021-05-03 MED ORDER — BACLOFEN 10 MG PO TABS
10 MG | ORAL_TABLET | ORAL | 11 refills | Status: AC
Start: 2021-05-03 — End: ?

## 2021-06-17 NOTE — Telephone Encounter (Signed)
-----   Message from Cyndi Lennert sent at 06/17/2021  9:10 AM EDT -----  Subject: Appointment Request    Reason for Call: Established Patient Appointment needed: New Patient   Request Appointment    QUESTIONS    Reason for appointment request? No appointments available during search     Additional Information for Provider? Patient is calling in and she is   wanting to know if she can see Gaspar Bidding again as her PCP. The last   appointment she had was in 07/27/2020, and she did move, but now she would   like to see her again because she is not happy with her doctor. There was   no appointments available for the rest of the year to schedule. She would   like to have an afternoon appointment on any day. Thank you.   ---------------------------------------------------------------------------  --------------  Rod Can INFO  [phone]; OK to leave message on voicemail  ---------------------------------------------------------------------------  --------------  SCRIPT ANSWERS  COVID Screen: Nyoka Cowden

## 2021-06-17 NOTE — Telephone Encounter (Signed)
Please advise if you will see patient

## 2021-06-21 NOTE — Telephone Encounter (Signed)
Pt states she will call back to schedule

## 2021-06-21 NOTE — Telephone Encounter (Signed)
Yes, she can return as a patient.

## 2021-08-03 ENCOUNTER — Ambulatory Visit: Payer: MEDICARE | Attending: Specialist

## 2021-08-03 ENCOUNTER — Encounter: Attending: Specialist
